# Patient Record
Sex: Female | Born: 1950 | Race: White | Hispanic: No | Marital: Married | Smoking: Never smoker
Health system: Southern US, Community
[De-identification: ages and names within clinical notes are randomized; demographics above are authoritative.]

## PROBLEM LIST (undated history)

## (undated) DIAGNOSIS — I4891 Unspecified atrial fibrillation: Secondary | ICD-10-CM

## (undated) DIAGNOSIS — E669 Obesity, unspecified: Secondary | ICD-10-CM

## (undated) DIAGNOSIS — I340 Nonrheumatic mitral (valve) insufficiency: Secondary | ICD-10-CM

## (undated) DIAGNOSIS — E785 Hyperlipidemia, unspecified: Secondary | ICD-10-CM

## (undated) DIAGNOSIS — C9110 Chronic lymphocytic leukemia of B-cell type not having achieved remission: Secondary | ICD-10-CM

## (undated) DIAGNOSIS — C9590 Leukemia, unspecified not having achieved remission: Secondary | ICD-10-CM

## (undated) DIAGNOSIS — I5189 Other ill-defined heart diseases: Secondary | ICD-10-CM

## (undated) DIAGNOSIS — G2581 Restless legs syndrome: Secondary | ICD-10-CM

## (undated) HISTORY — DX: Obesity, unspecified: E66.9

## (undated) HISTORY — DX: Hyperlipidemia, unspecified: E78.5

## (undated) HISTORY — DX: Restless legs syndrome: G25.81

## (undated) HISTORY — PX: TOTAL ABDOMINAL HYSTERECTOMY: SHX209

## (undated) HISTORY — PX: HEMORROIDECTOMY: SUR656

## (undated) HISTORY — DX: Unspecified atrial fibrillation: I48.91

## (undated) HISTORY — PX: ABDOMINAL HYSTERECTOMY: SHX81

## (undated) HISTORY — DX: Leukemia, unspecified not having achieved remission: C95.90

---

## 2005-01-26 ENCOUNTER — Ambulatory Visit: Payer: Self-pay | Admitting: General Surgery

## 2008-05-27 ENCOUNTER — Ambulatory Visit: Payer: Self-pay | Admitting: Internal Medicine

## 2008-05-27 IMAGING — CT CT HEAD WITHOUT CONTRAST
1 series · 16 of 30 positions shown, 20 images · non-contrast
Comparison: none

REASON FOR EXAM: memory loss
COMMENTS:

[Series 2: soft tissue · axial · 0.40mm/px · z∈[+912,+1047]mm · 16 of 31 slices shown, 20 images]
[im 2/31  brain]
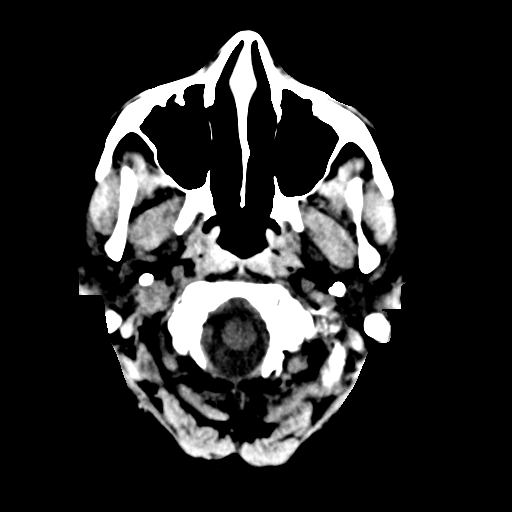
[im 2/31  bone]
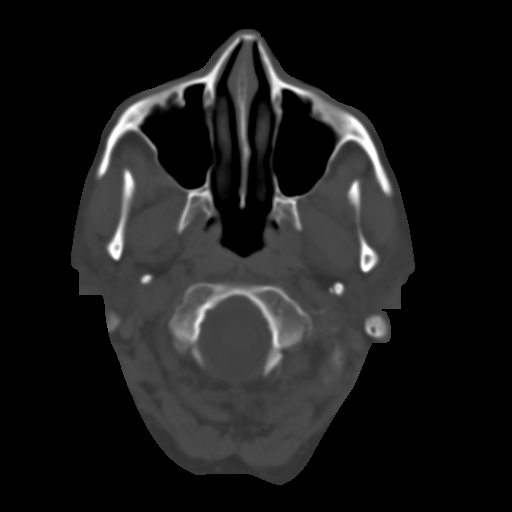
[im 4/31  brain]
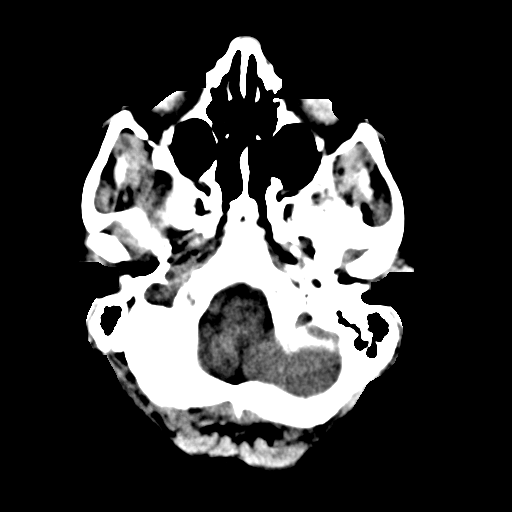
[im 6/31  brain]
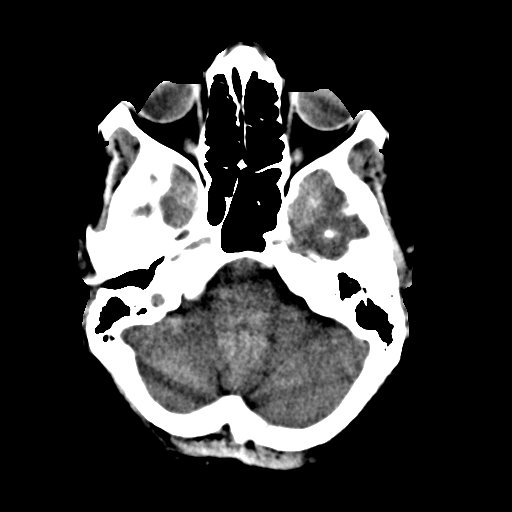
[im 8/31  brain]
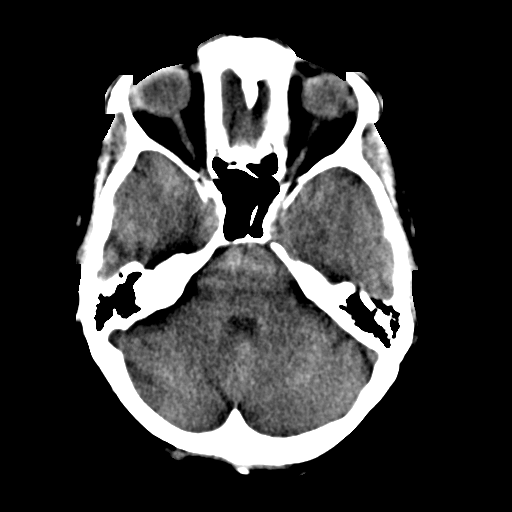
[im 9/31  brain]
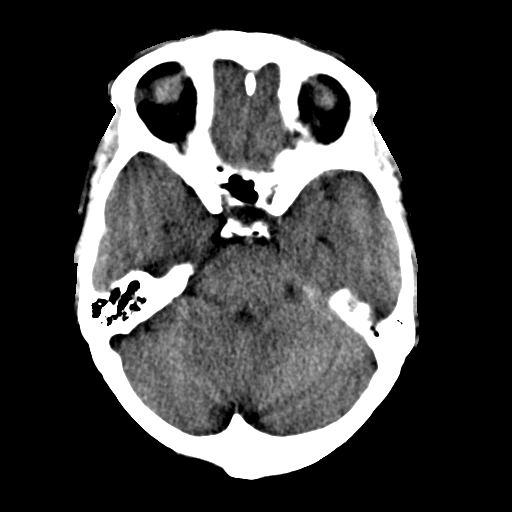
[im 9/31  bone]
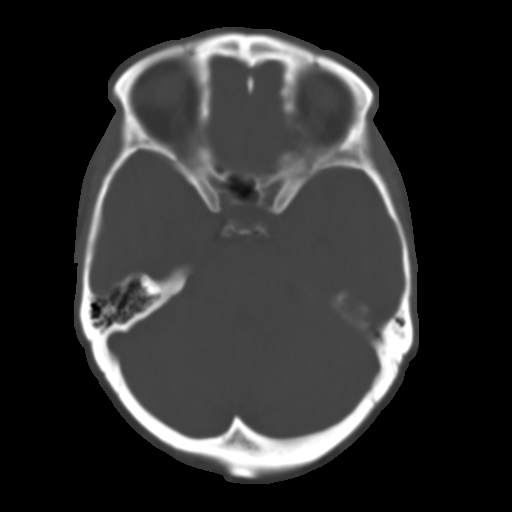
[im 11/31  brain]
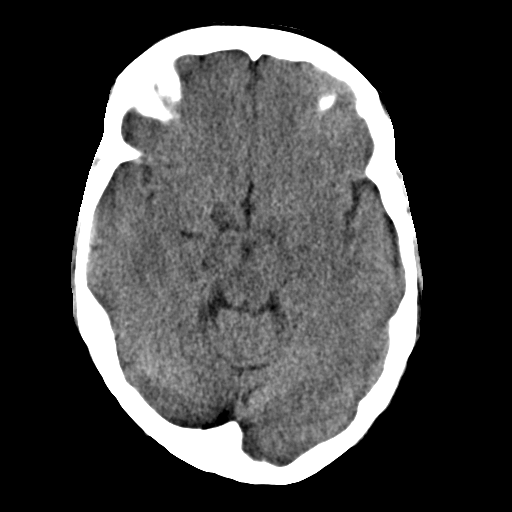
[im 13/31  brain]
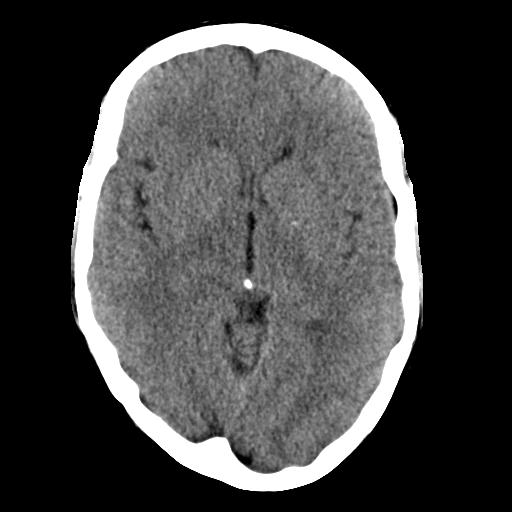
[im 15/31  brain]
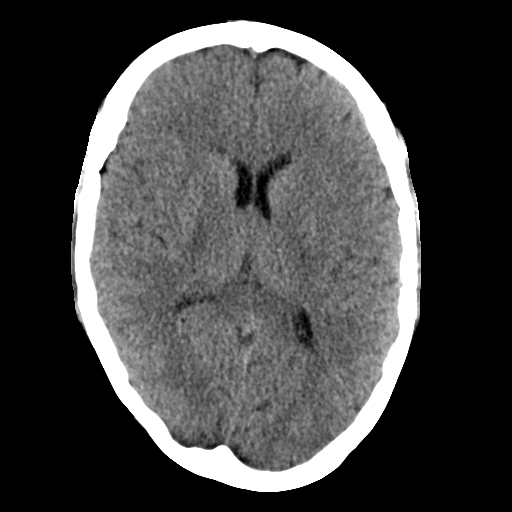
[im 16/31  brain]
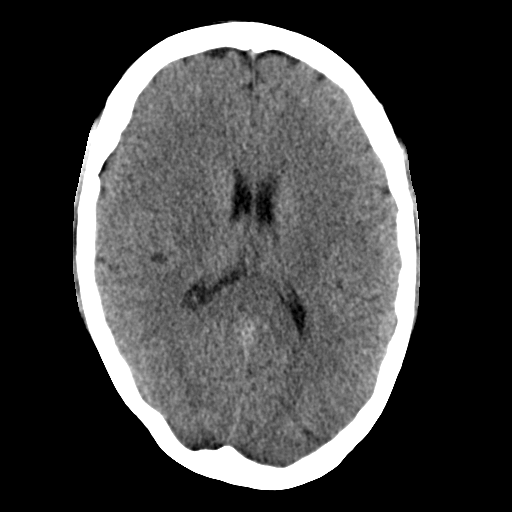
[im 16/31  bone]
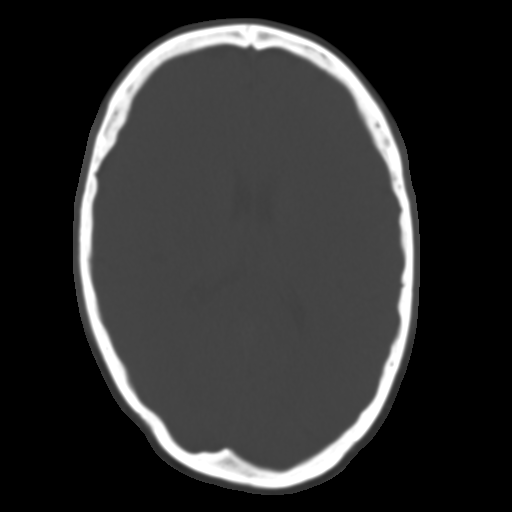
[im 18/31  brain]
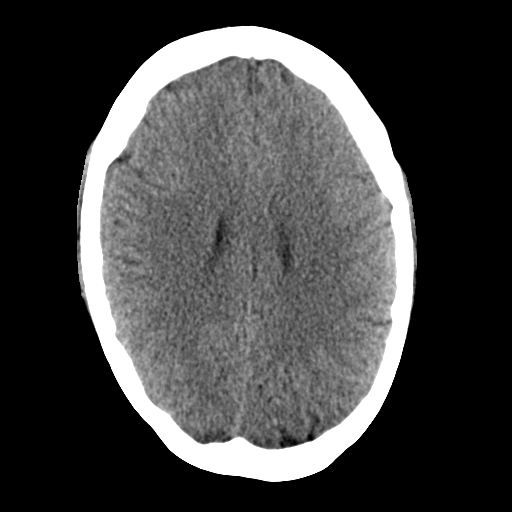
[im 20/31  brain]
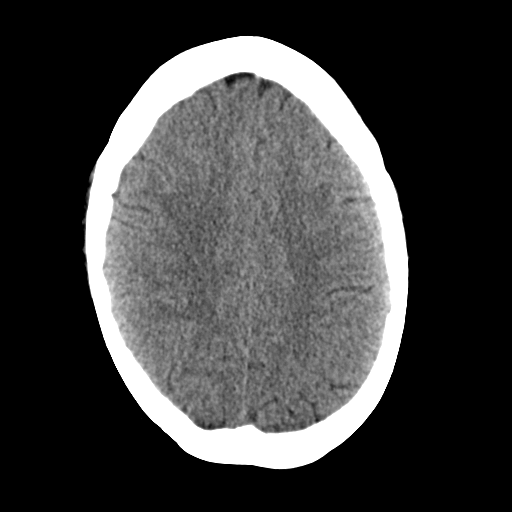
[im 22/31  brain]
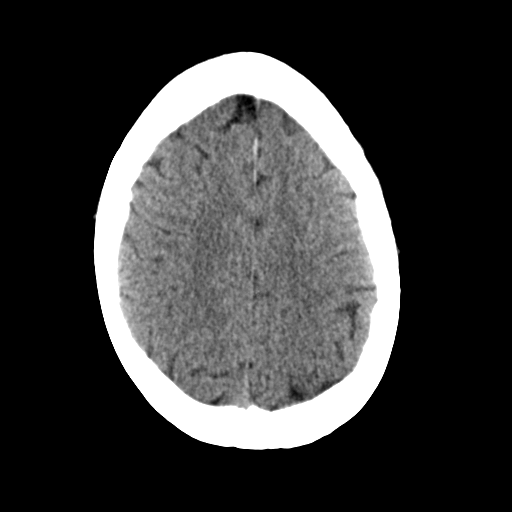
[im 23/31  brain]
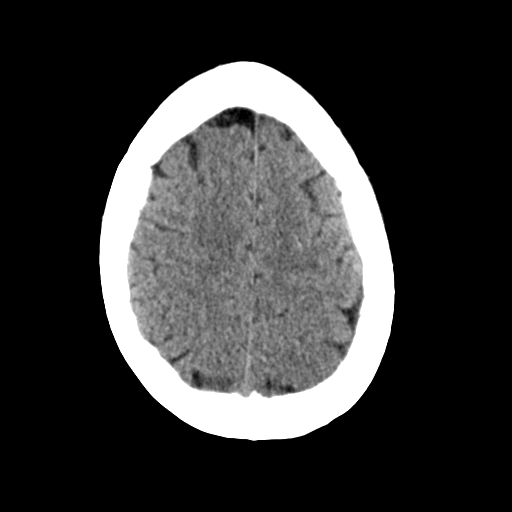
[im 23/31  bone]
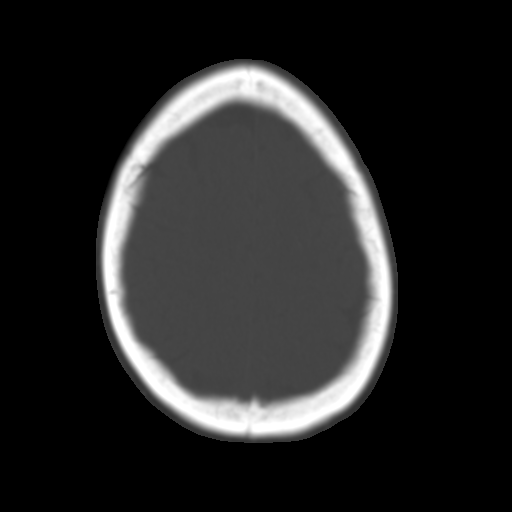
[im 25/31  brain]
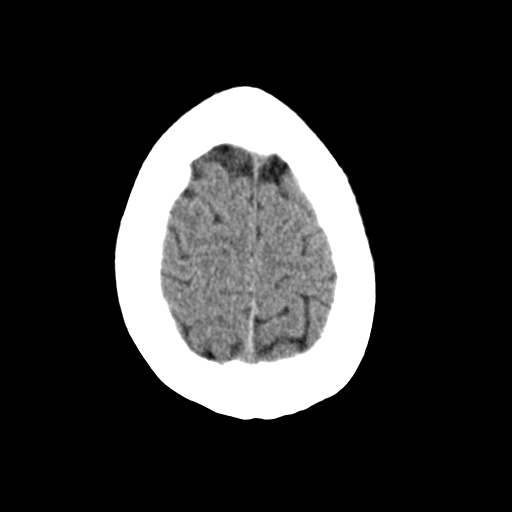
[im 27/31  brain]
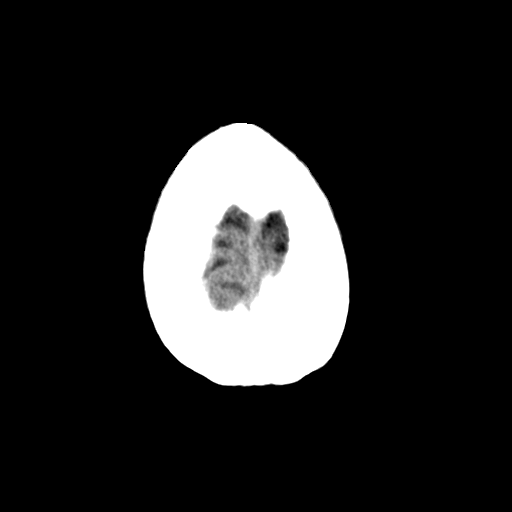
[im 29/31  brain]
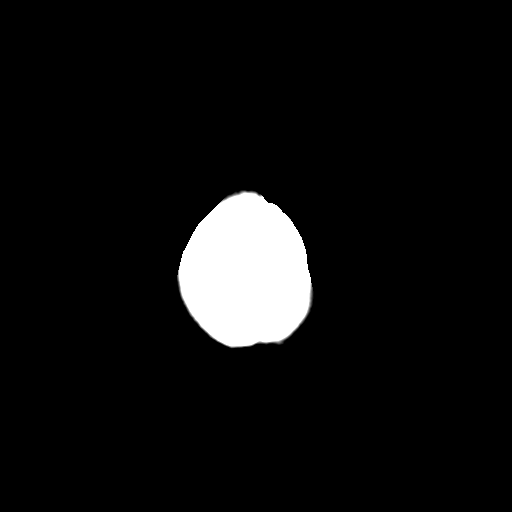

[16 of 30 positions shown; findings below may reference images not displayed]

PROCEDURE:     CT  - CT HEAD WITHOUT CONTRAST  - [DATE]  [DATE]

RESULT:     There is no evidence of intra-axial or extra-axial fluid
collections.  There is no evidence of acute hemorrhage or secondary signs
reflecting mass effect, subacute or chronic infarction.  The visualized bony
skeleton is evaluated and there is no evidence of depressed skull fracture
or further fracture or dislocation.  The visualized mastoid air cells are
clear.  The ventricles, cisterns and sulci are symmetric and patent.
IMPRESSION: 1)No evidence of acute intracranial abnormalities as described above.

2)If there is persistent clinical concern, further evaluation with Brain MRI
is recommended if clinically warranted.

## 2011-02-23 ENCOUNTER — Ambulatory Visit: Payer: Self-pay

## 2011-02-23 IMAGING — MG MAM KOM DGT SCR W/CAD NO ORDER
1 series · 4 of 4 positions shown · non-contrast
Comparison: none

REASON FOR EXAM: scr
COMMENTS:

PROCEDURE:     MAM - MAM DROMNI DGT SCR W/CAD NO ORDER  - [DATE] [DATE]
RESULT:      No dominant masses or pathologic clustered calcifications are
demonstrated.  Exam is stable from prior exams. CAD evaluation is nonfocal.

[Series 3641: R CC · right · 4 of 4 slices shown]
[im 1/4]
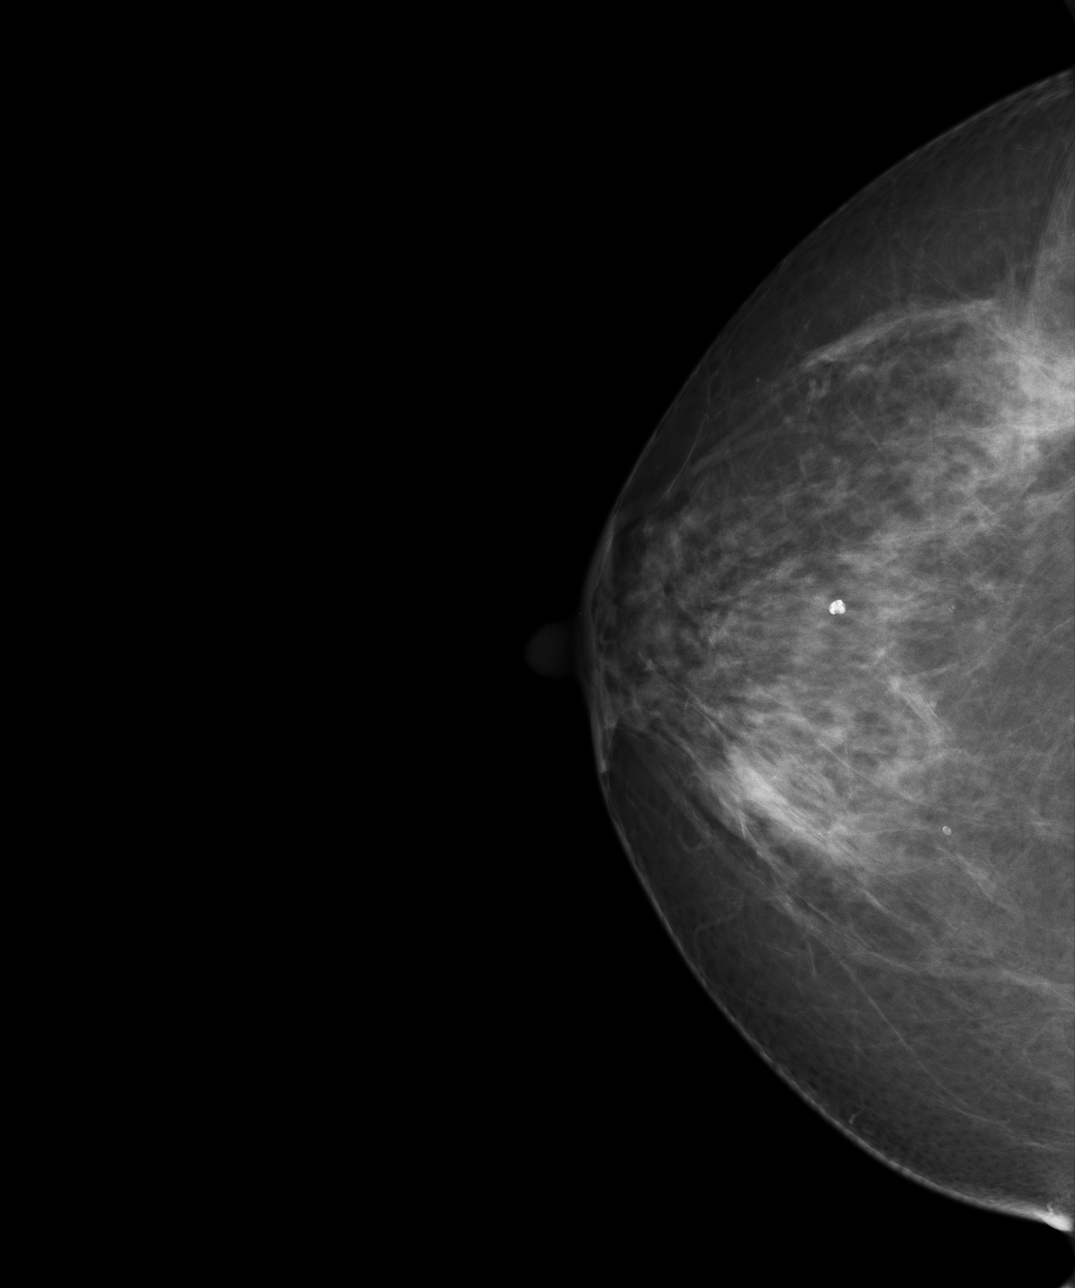
[im 2/4]
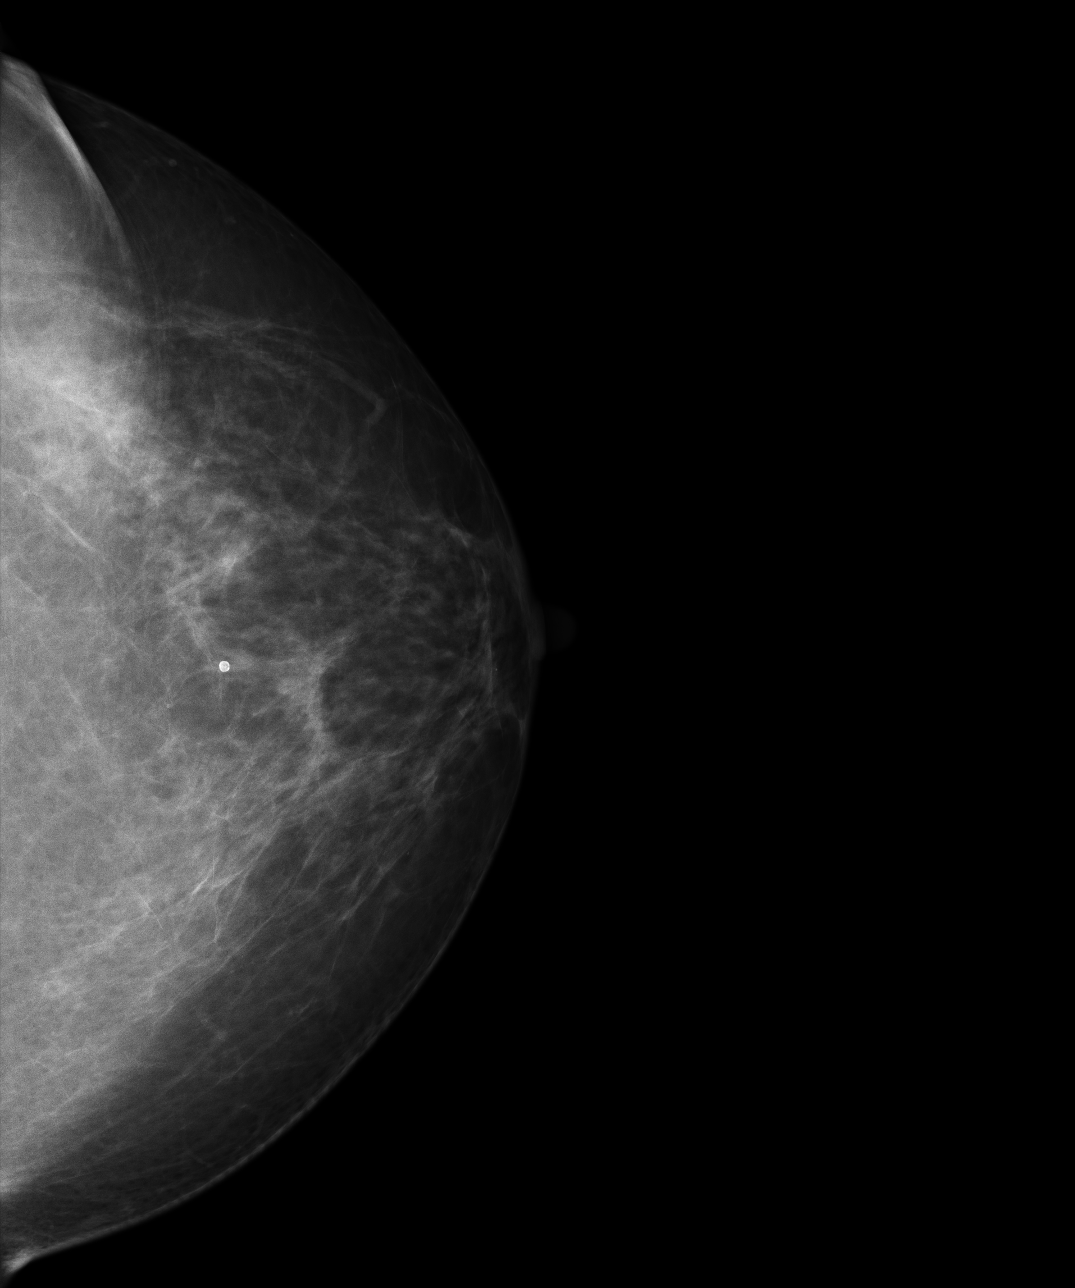
[im 3/4]
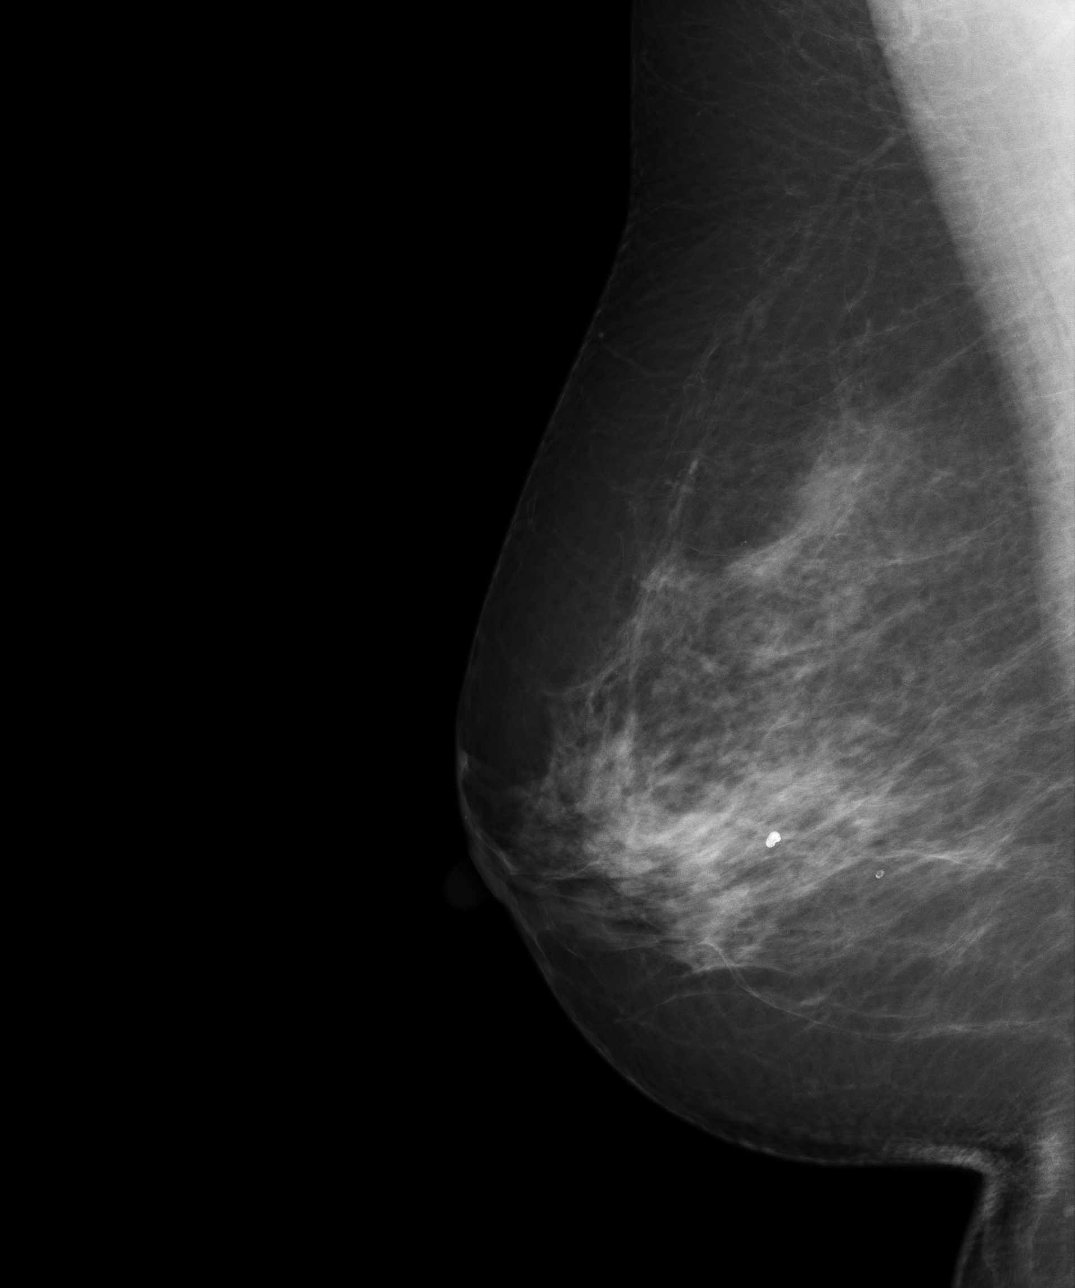
[im 4/4]
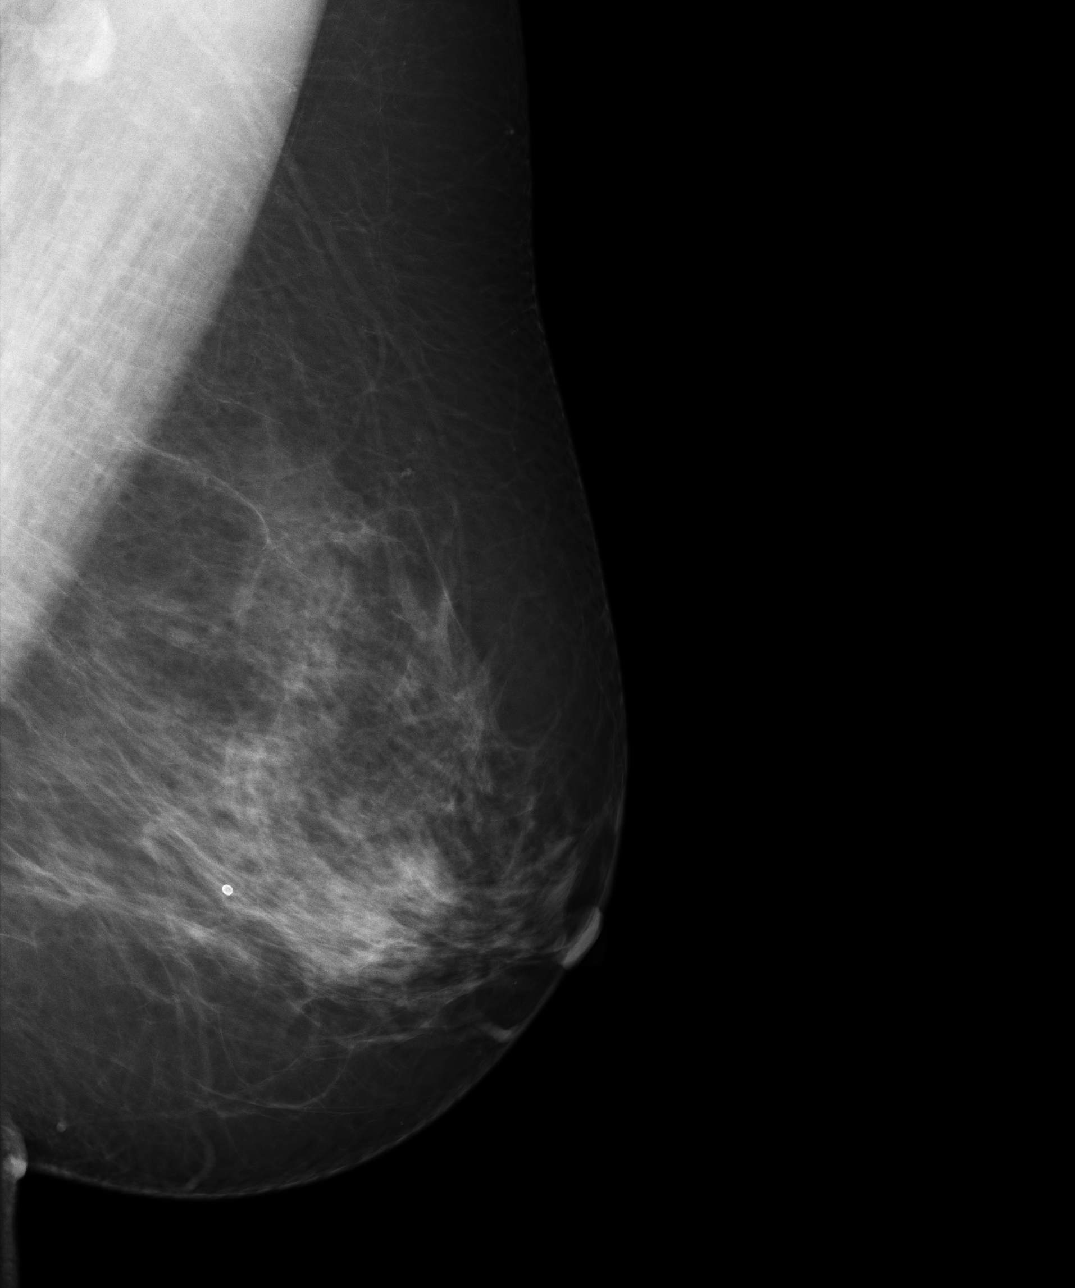

[4 of 4 positions shown; findings below may reference images not displayed]

IMPRESSION: 1.     Benign exam.
2.     Routine yearly follow up exam is suggested.
3.     BI-RADS:  Category 2- Benign Finding.

A negative mammogram report does not preclude biopsy or other evaluation of
a clinically palpable or otherwise suspicious mass or lesion. Breast cancer
may not be detected by mammography in up to 10% of cases.

## 2013-02-19 ENCOUNTER — Ambulatory Visit: Payer: Self-pay

## 2013-02-19 IMAGING — MG MM DIGITAL SCREENING BILAT W/ CAD
1 series · 5 of 5 positions shown · non-contrast
Comparison: Previous exam(s).

REASON FOR EXAM: [REDACTED] SCR MAMMO
COMMENTS:

PROCEDURE:     MAM - MAM [REDACTED] DIG SCREEN MAM W/CAD  - [DATE] [DATE]
CLINICAL DATA: Screening.
DIGITAL SCREENING BILATERAL MAMMOGRAM WITH CAD

[R CC · right · 5 of 5 slices shown]
[im 1/5]
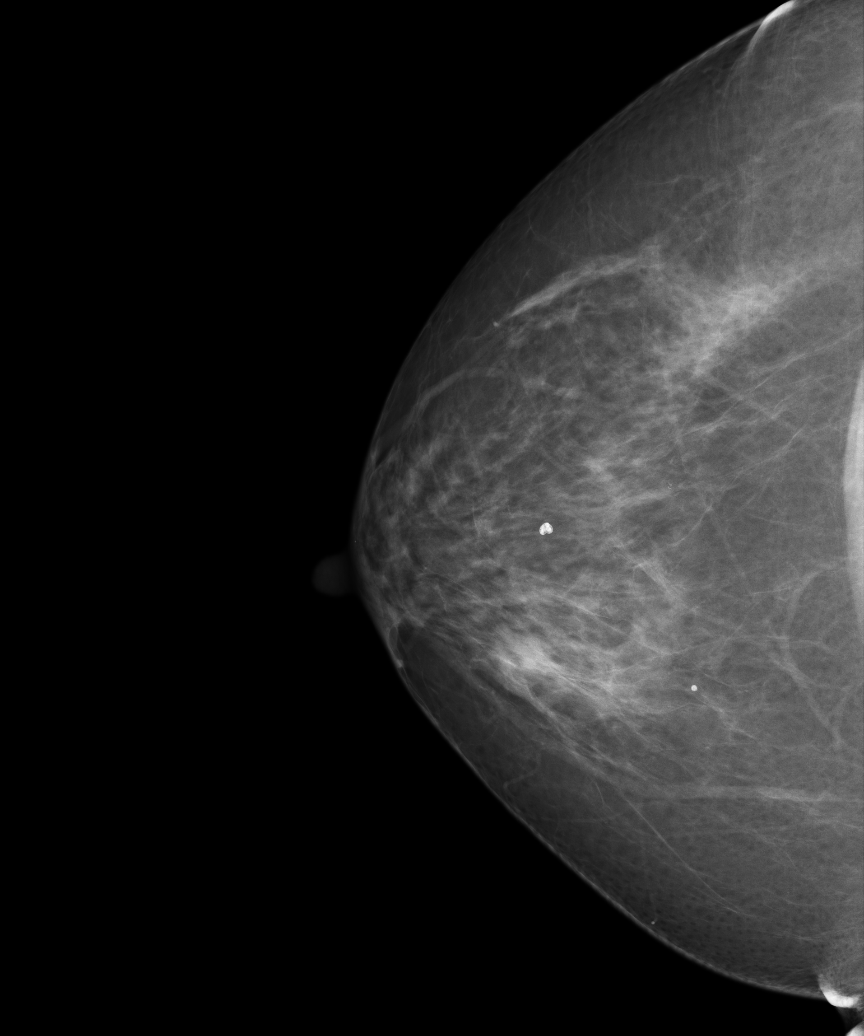
[im 2/5]
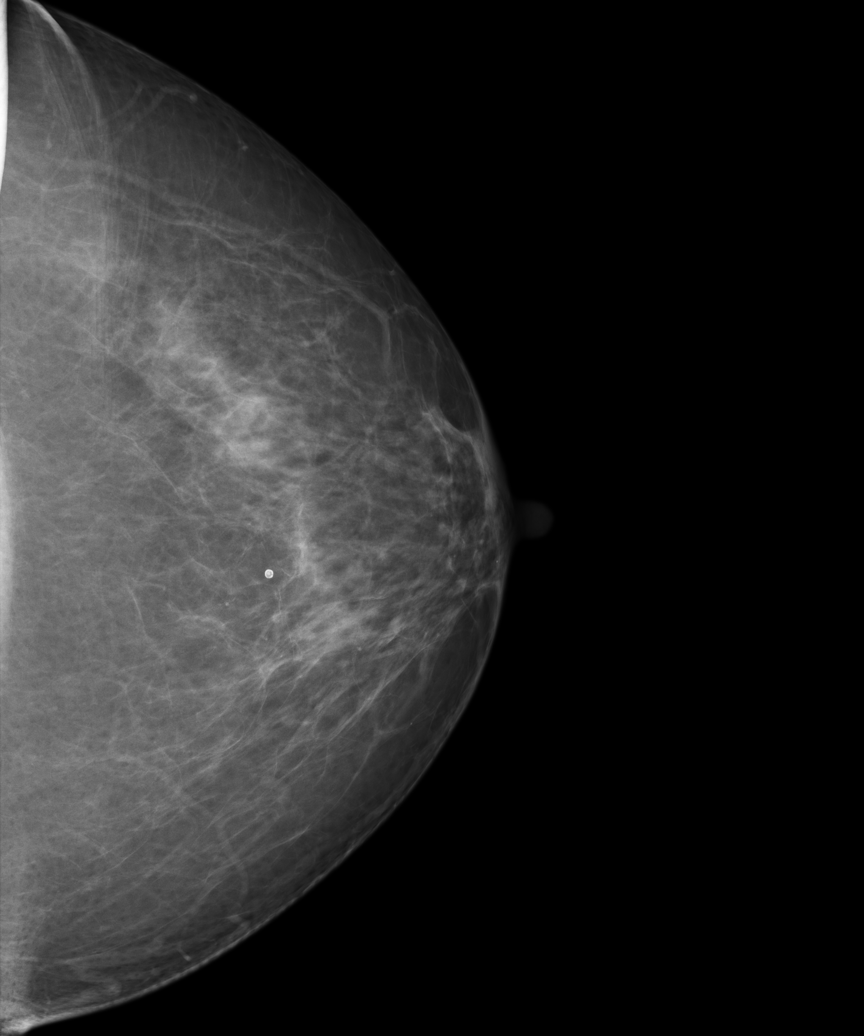
[im 3/5]
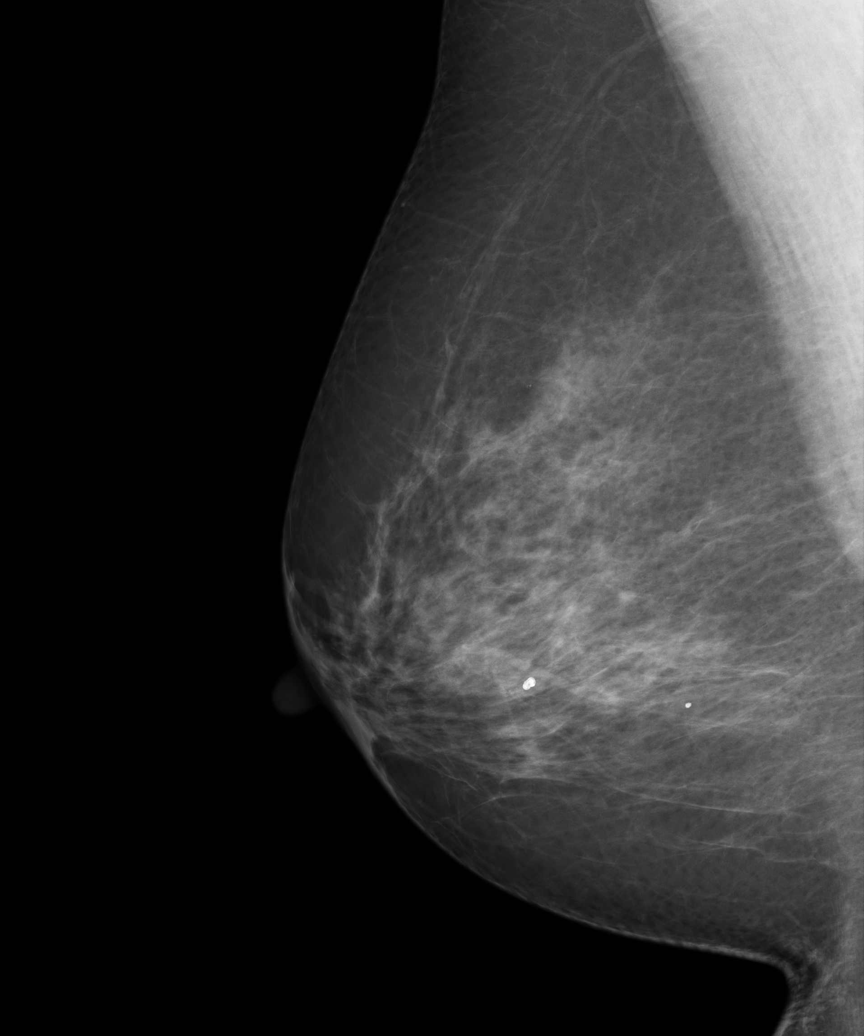
[im 4/5]
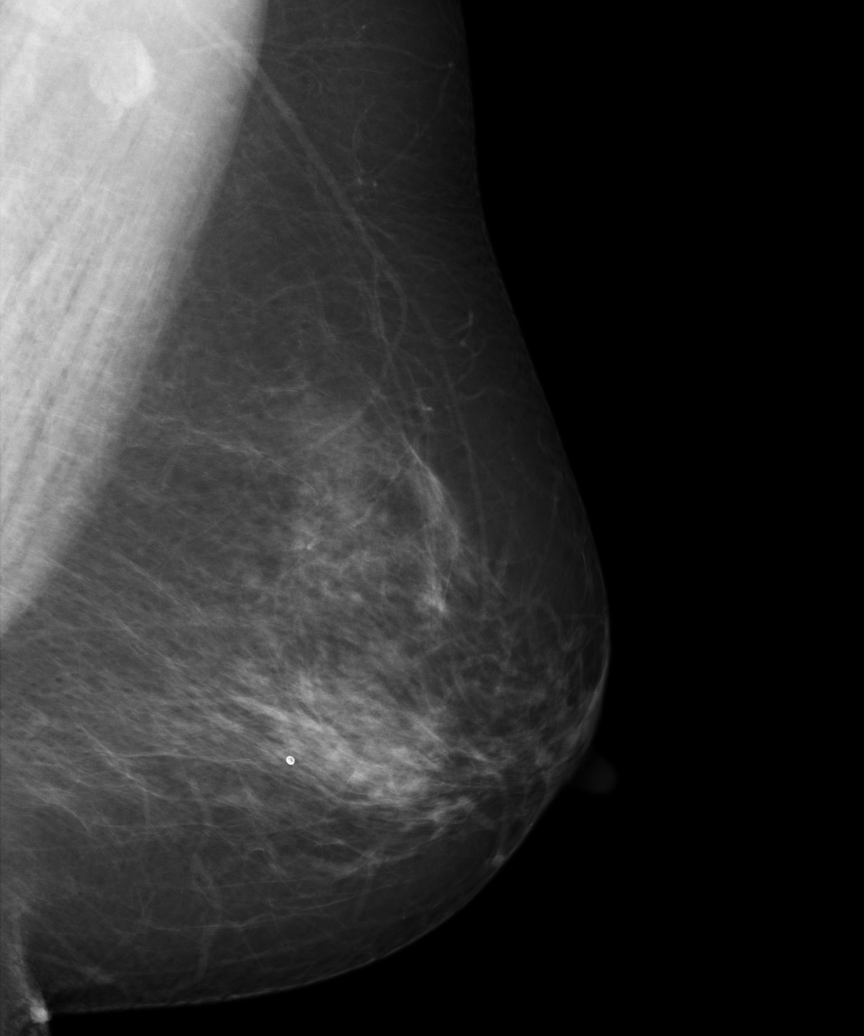
[im 5/5]
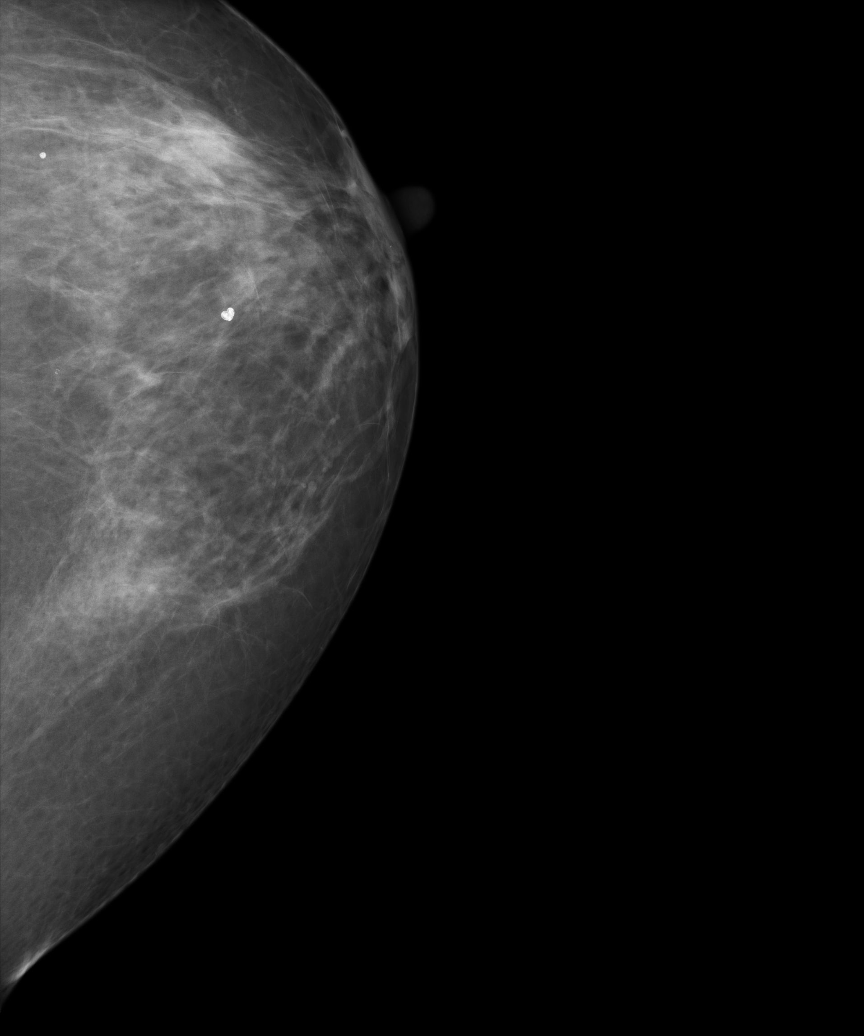

[5 of 5 positions shown; findings below may reference images not displayed]

FINDINGS: ACR Breast Density Category c:  The breast tissue is
heterogeneously dense, which may obscure small masses.

There are no findings suspicious for malignancy.

Images were processed with CAD.
IMPRESSION: No mammographic evidence of malignancy.

A result letter of this screening mammogram will be mailed directly
to the patient.

RECOMMENDATION:
Screening mammogram in one year. (Code:[[place]])

BI-RADS CATEGORY 1:  Negative.

## 2014-02-18 ENCOUNTER — Ambulatory Visit: Payer: Self-pay | Admitting: Physician Assistant

## 2014-02-18 IMAGING — MG MM DIGITAL SCREENING BILAT W/ CAD
1 series · 4 of 4 positions shown · non-contrast
Comparison: Previous exam(s).

CLINICAL DATA: Screening.

EXAM:
DIGITAL SCREENING BILATERAL MAMMOGRAM WITH CAD

[R CC · right · 4 of 4 slices shown]
[im 1/4]
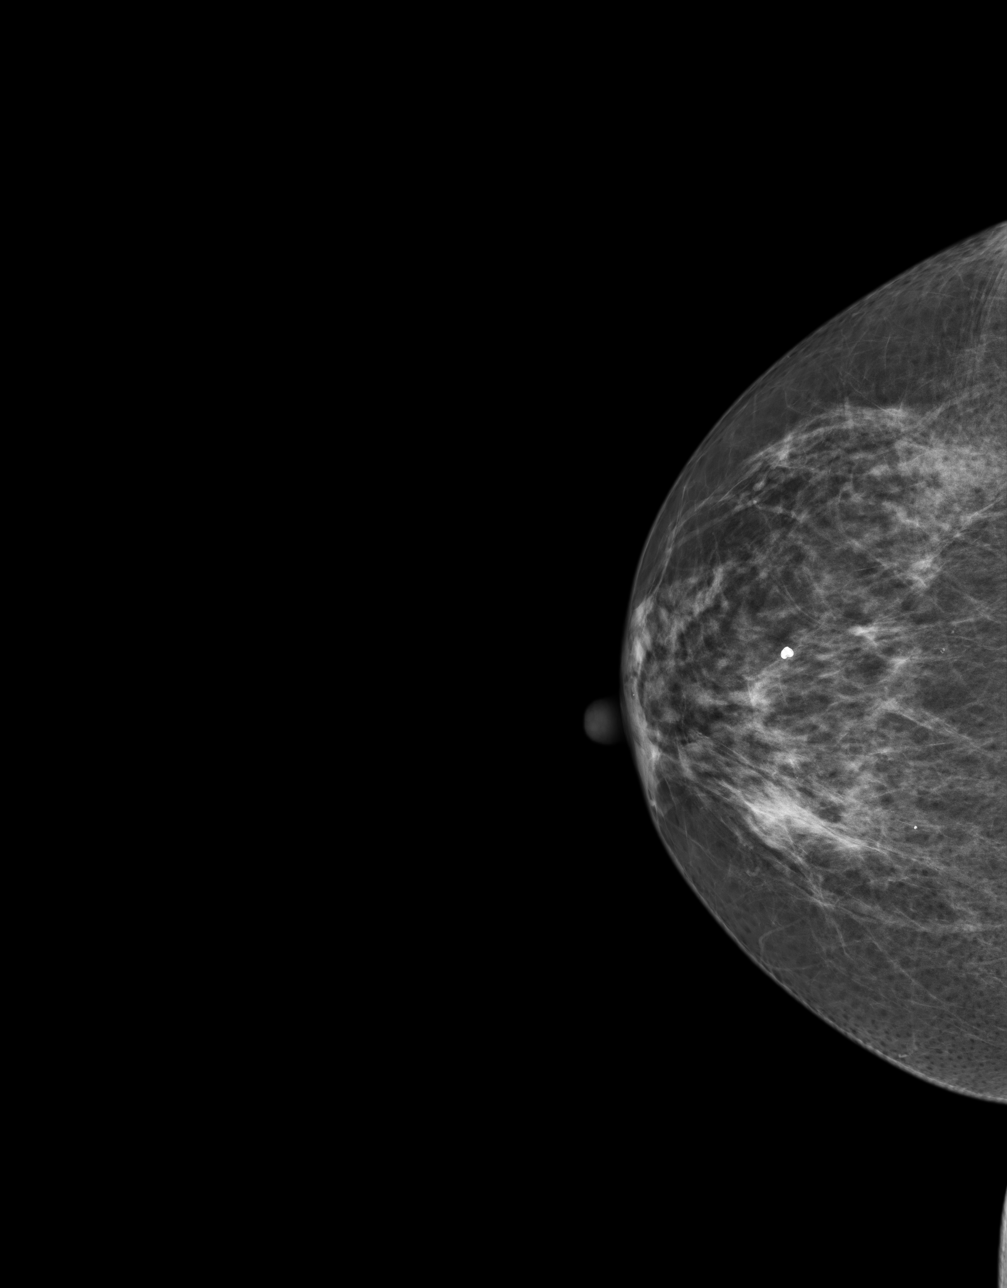
[im 2/4]
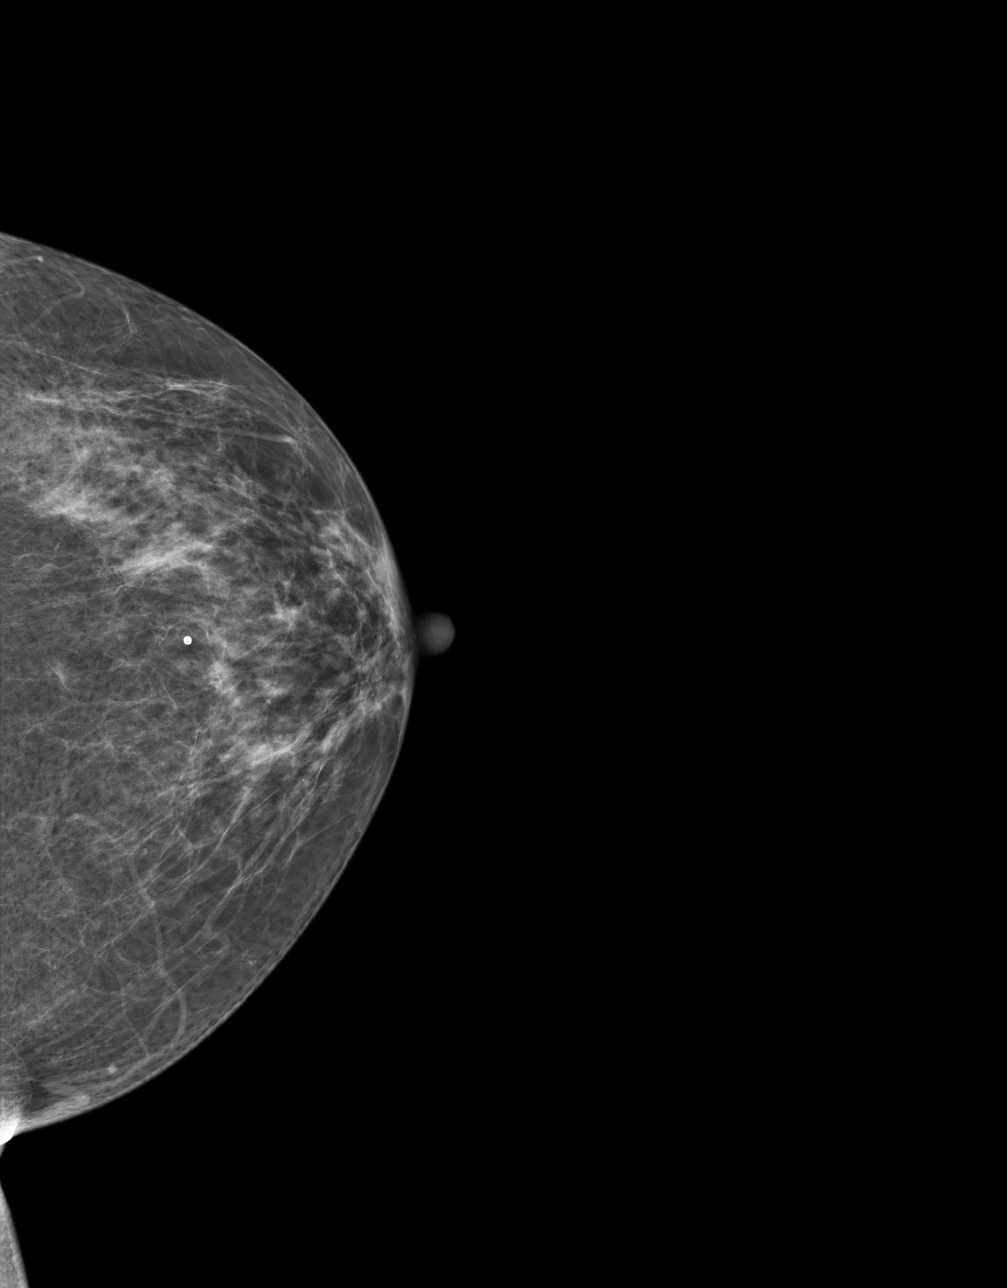
[im 3/4]
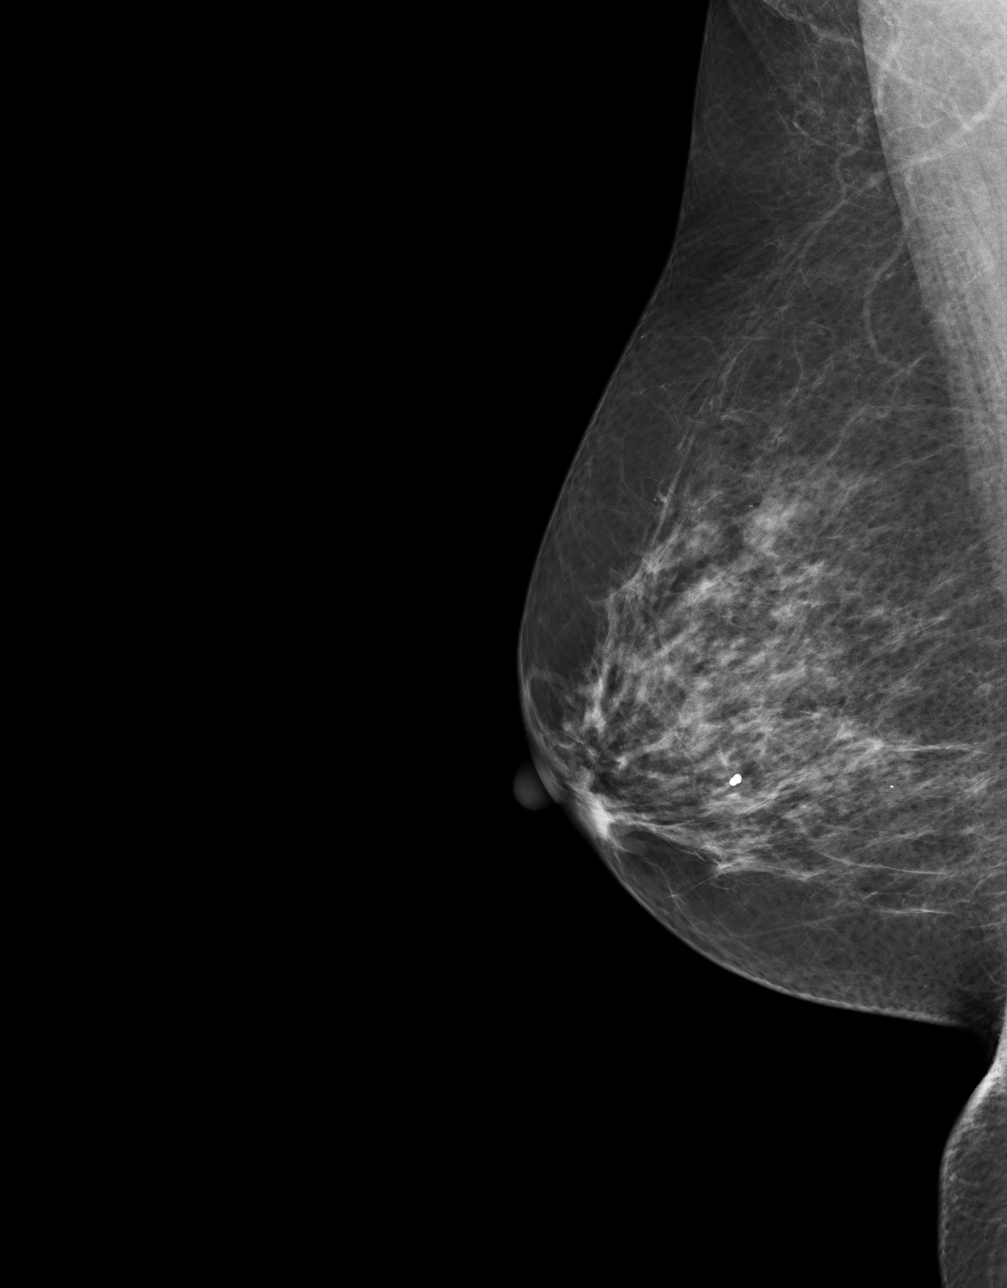
[im 4/4]
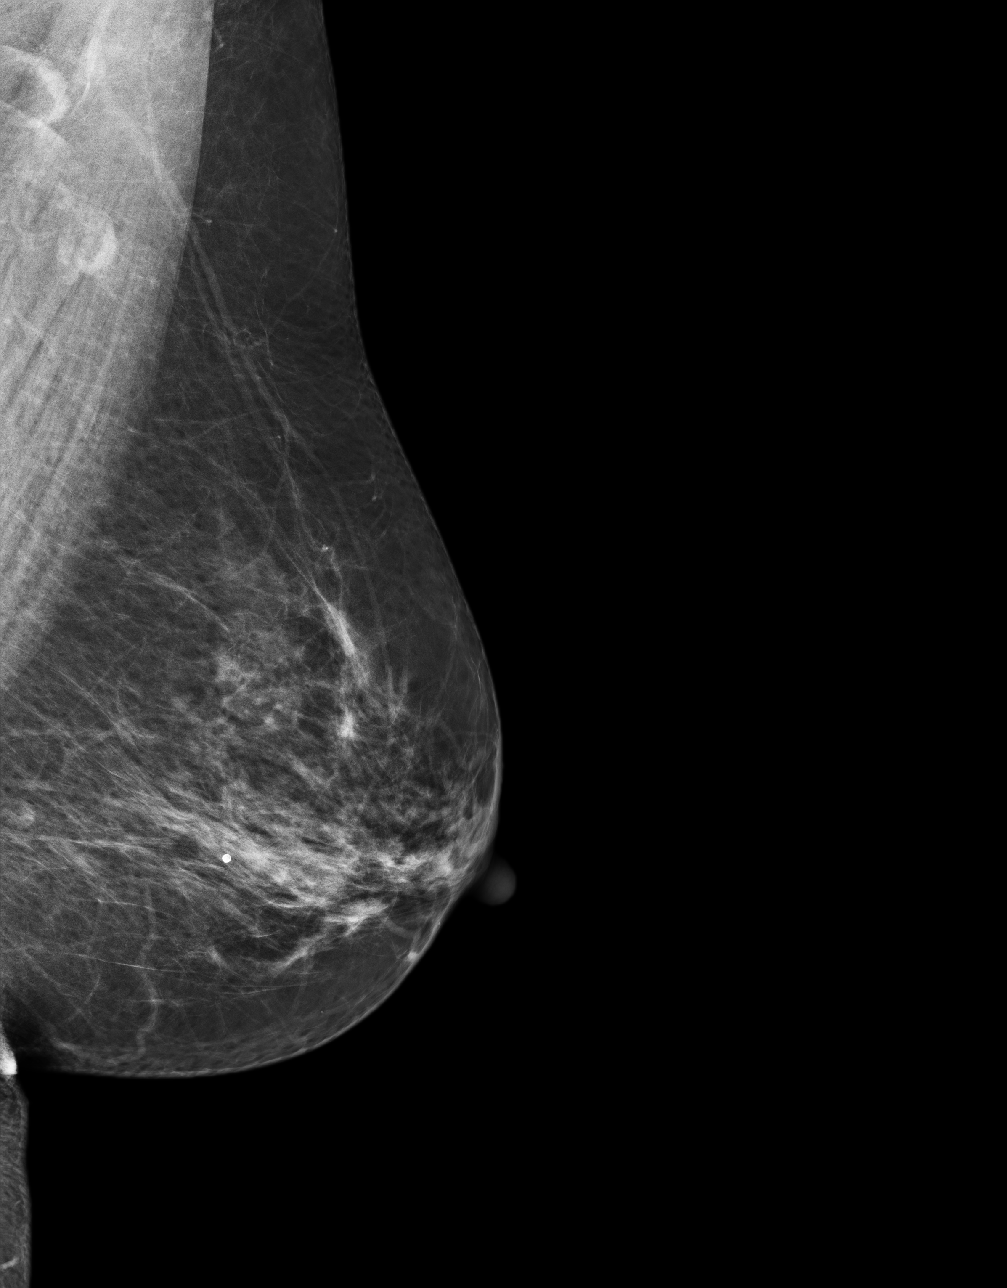

[4 of 4 positions shown; findings below may reference images not displayed]

ACR Breast Density Category c: The breast tissue is heterogeneously
dense, which may obscure small masses.
FINDINGS: There are no findings suspicious for malignancy. Images were
processed with CAD.
IMPRESSION: No mammographic evidence of malignancy. A result letter of this
screening mammogram will be mailed directly to the patient.

RECOMMENDATION:
Screening mammogram in one year. (Code:[0J])

BI-RADS CATEGORY  1: Negative.

## 2014-12-26 ENCOUNTER — Encounter: Payer: Self-pay | Admitting: Family Medicine

## 2014-12-26 ENCOUNTER — Ambulatory Visit (INDEPENDENT_AMBULATORY_CARE_PROVIDER_SITE_OTHER): Payer: PRIVATE HEALTH INSURANCE | Admitting: Family Medicine

## 2014-12-26 VITALS — BP 117/74 | HR 80 | Temp 97.2°F | Ht 65.5 in | Wt 221.0 lb

## 2014-12-26 DIAGNOSIS — Z1239 Encounter for other screening for malignant neoplasm of breast: Secondary | ICD-10-CM

## 2014-12-26 DIAGNOSIS — G2581 Restless legs syndrome: Secondary | ICD-10-CM | POA: Diagnosis not present

## 2014-12-26 DIAGNOSIS — Z0000 Encounter for general adult medical examination without abnormal findings: Secondary | ICD-10-CM

## 2014-12-26 DIAGNOSIS — Z79890 Hormone replacement therapy (postmenopausal): Secondary | ICD-10-CM | POA: Diagnosis not present

## 2014-12-26 DIAGNOSIS — E669 Obesity, unspecified: Secondary | ICD-10-CM

## 2014-12-26 MED ORDER — ROPINIROLE HCL 1 MG PO TABS: 1.0000 mg | ORAL_TABLET | Freq: Three times a day (TID) | ORAL | Status: DC

## 2014-12-26 NOTE — Patient Instructions (Addendum)
Try decreasing your estrogen to every other day Please do get a mammogram and once I see that it is normal, we'll consider continuing your estrogen but at a lower level; it is ideal to taper you off of this as tolerated  Check out the information at familydoctor.org entitled "What It Takes to Lose Weight" Try to lose between 1-2 pounds per week by taking in fewer calories and burning off more calories You can succeed by limiting portions, limiting foods dense in calories and fat, becoming more active, and drinking 8 glasses of water a day Don't skip meals, especially breakfast, as skipping meals may alter your metabolism Do not use over-the-counter weight loss pills or gimmicks that claim rapid weight loss A healthy BMI (or body mass index) is between 18.5 and 24.9 You can calculate your ideal BMI at the [employer] website [employer]fr  We'll check labs today and contact you about those results  Health Maintenance Adopting a healthy lifestyle and getting preventive care can go a long way to promote health and wellness. Talk with your health care provider about what schedule of regular examinations is right for you. This is a good chance for you to check in with your provider about disease prevention and staying healthy. In between checkups, there are plenty of things you can do on your own. Experts have done a lot of research about which lifestyle changes and preventive measures are most likely to keep you healthy. Ask your health care provider for more information. WEIGHT AND DIET  Eat a healthy diet  Be sure to include plenty of vegetables, fruits, low-fat dairy products, and lean protein.  Do not eat a lot of foods high in solid fats, added sugars, or salt.  Get regular exercise. This is one of the most important things you can do for your health.  Most adults should exercise for at least 150 minutes each week. The exercise should increase  your heart rate and make you sweat (moderate-intensity exercise).  Most adults should also do strengthening exercises at least twice a week. This is in addition to the moderate-intensity exercise.  Maintain a healthy weight  Body mass index (BMI) is a measurement that can be used to identify possible weight problems. It estimates body fat based on height and weight. Your health care provider can help determine your BMI and help you achieve or maintain a healthy weight.  For females 55 years of age and older:   A BMI below 18.5 is considered underweight.  A BMI of 18.5 to 24.9 is normal.  A BMI of 25 to 29.9 is considered overweight.  A BMI of 30 and above is considered obese.  Watch levels of cholesterol and blood lipids  You should start having your blood tested for lipids and cholesterol at 63 years of age, then have this test every 5 years.  You may need to have your cholesterol levels checked more often if:  Your lipid or cholesterol levels are high.  You are older than 63 years of age.  You are at high risk for heart disease.  CANCER SCREENING   Lung Cancer  Lung cancer screening is recommended for adults 65-85 years old who are at high risk for lung cancer because of a history of smoking.  A yearly low-dose CT scan of the lungs is recommended for people who:  Currently smoke.  Have quit within the past 15 years.  Have at least a 30-pack-year history of smoking. A pack year is smoking an  average of one pack of cigarettes a day for 1 year.  Yearly screening should continue until it has been 15 years since you quit.  Yearly screening should stop if you develop a health problem that would prevent you from having lung cancer treatment.  Breast Cancer  Practice breast self-awareness. This means understanding how your breasts normally appear and feel.  It also means doing regular breast self-exams. Let your health care provider know about any changes, no matter  how small.  If you are in your 20s or 30s, you should have a clinical breast exam (CBE) by a health care provider every 1-3 years as part of a regular health exam.  If you are 10 or older, have a CBE every year. Also consider having a breast X-ray (mammogram) every year.  If you have a family history of breast cancer, talk to your health care provider about genetic screening.  If you are at high risk for breast cancer, talk to your health care provider about having an MRI and a mammogram every year.  Breast cancer gene (BRCA) assessment is recommended for women who have family members with BRCA-related cancers. BRCA-related cancers include:  Breast.  Ovarian.  Tubal.  Peritoneal cancers.  Results of the assessment will determine the need for genetic counseling and BRCA1 and BRCA2 testing. Cervical Cancer Routine pelvic examinations to screen for cervical cancer are no longer recommended for nonpregnant women who are considered low risk for cancer of the pelvic organs (ovaries, uterus, and vagina) and who do not have symptoms. A pelvic examination may be necessary if you have symptoms including those associated with pelvic infections. Ask your health care provider if a screening pelvic exam is right for you.   The Pap test is the screening test for cervical cancer for women who are considered at risk.  If you had a hysterectomy for a problem that was not cancer or a condition that could lead to cancer, then you no longer need Pap tests.  If you are older than 65 years, and you have had normal Pap tests for the past 10 years, you no longer need to have Pap tests.  If you have had past treatment for cervical cancer or a condition that could lead to cancer, you need Pap tests and screening for cancer for at least 20 years after your treatment.  If you no longer get a Pap test, assess your risk factors if they change (such as having a new sexual partner). This can affect whether you should  start being screened again.  Some women have medical problems that increase their chance of getting cervical cancer. If this is the case for you, your health care provider may recommend more frequent screening and Pap tests.  The human papillomavirus (HPV) test is another test that may be used for cervical cancer screening. The HPV test looks for the virus that can cause cell changes in the cervix. The cells collected during the Pap test can be tested for HPV.  The HPV test can be used to screen women 83 years of age and older. Getting tested for HPV can extend the interval between normal Pap tests from three to five years.  An HPV test also should be used to screen women of any age who have unclear Pap test results.  After 63 years of age, women should have HPV testing as often as Pap tests.  Colorectal Cancer  This type of cancer can be detected and often prevented.  Routine  colorectal cancer screening usually begins at 63 years of age and continues through 63 years of age.  Your health care provider may recommend screening at an earlier age if you have risk factors for colon cancer.  Your health care provider may also recommend using home test kits to check for hidden blood in the stool.  A small camera at the end of a tube can be used to examine your colon directly (sigmoidoscopy or colonoscopy). This is done to check for the earliest forms of colorectal cancer.  Routine screening usually begins at age 45.  Direct examination of the colon should be repeated every 5-10 years through 63 years of age. However, you may need to be screened more often if early forms of precancerous polyps or small growths are found. Skin Cancer  Check your skin from head to toe regularly.  Tell your health care provider about any new moles or changes in moles, especially if there is a change in a mole's shape or color.  Also tell your health care provider if you have a mole that is larger than the size  of a pencil eraser.  Always use sunscreen. Apply sunscreen liberally and repeatedly throughout the day.  Protect yourself by wearing long sleeves, pants, a wide-brimmed hat, and sunglasses whenever you are outside. HEART DISEASE, DIABETES, AND HIGH BLOOD PRESSURE   Have your blood pressure checked at least every 1-2 years. High blood pressure causes heart disease and increases the risk of stroke.  If you are between 62 years and 1 years old, ask your health care provider if you should take aspirin to prevent strokes.  Have regular diabetes screenings. This involves taking a blood sample to check your fasting blood sugar level.  If you are at a normal weight and have a low risk for diabetes, have this test once every three years after 63 years of age.  If you are overweight and have a high risk for diabetes, consider being tested at a younger age or more often. PREVENTING INFECTION  Hepatitis B  If you have a higher risk for hepatitis B, you should be screened for this virus. You are considered at high risk for hepatitis B if:  You were born in a country where hepatitis B is common. Ask your health care provider which countries are considered high risk.  Your parents were born in a high-risk country, and you have not been immunized against hepatitis B (hepatitis B vaccine).  You have HIV or AIDS.  You use needles to inject street drugs.  You live with someone who has hepatitis B.  You have had sex with someone who has hepatitis B.  You get hemodialysis treatment.  You take certain medicines for conditions, including cancer, organ transplantation, and autoimmune conditions. Hepatitis C  Blood testing is recommended for:  Everyone born from [birth year] through 1964.  Anyone with known risk factors for hepatitis C. Sexually transmitted infections (STIs)  You should be screened for sexually transmitted infections (STIs) including gonorrhea and chlamydia if:  You are sexually  active and are younger than 63 years of age.  You are older than 63 years of age and your health care provider tells you that you are at risk for this type of infection.  Your sexual activity has changed since you were last screened and you are at an increased risk for chlamydia or gonorrhea. Ask your health care provider if you are at risk.  If you do not have HIV, but are at  risk, it may be recommended that you take a prescription medicine daily to prevent HIV infection. This is called pre-exposure prophylaxis (PrEP). You are considered at risk if:  You are sexually active and do not regularly use condoms or know the HIV status of your partner(s).  You take drugs by injection.  You are sexually active with a partner who has HIV. Talk with your health care provider about whether you are at high risk of being infected with HIV. If you choose to begin PrEP, you should first be tested for HIV. You should then be tested every 3 months for as long as you are taking PrEP.  PREGNANCY   If you are premenopausal and you may become pregnant, ask your health care provider about preconception counseling.  If you may become pregnant, take 400 to 800 micrograms (mcg) of folic acid every day.  If you want to prevent pregnancy, talk to your health care provider about birth control (contraception). OSTEOPOROSIS AND MENOPAUSE   Osteoporosis is a disease in which the bones lose minerals and strength with aging. This can result in serious bone fractures. Your risk for osteoporosis can be identified using a bone density scan.  If you are 34 years of age or older, or if you are at risk for osteoporosis and fractures, ask your health care provider if you should be screened.  Ask your health care provider whether you should take a calcium or vitamin D supplement to lower your risk for osteoporosis.  Menopause may have certain physical symptoms and risks.  Hormone replacement therapy may reduce some of these  symptoms and risks. Talk to your health care provider about whether hormone replacement therapy is right for you.  HOME CARE INSTRUCTIONS   Schedule regular health, dental, and eye exams.  Stay current with your immunizations.   Do not use any tobacco products including cigarettes, chewing tobacco, or electronic cigarettes.  If you are pregnant, do not drink alcohol.  If you are breastfeeding, limit how much and how often you drink alcohol.  Limit alcohol intake to no more than 1 drink per day for nonpregnant women. One drink equals 12 ounces of beer, 5 ounces of wine, or 1 ounces of hard liquor.  Do not use street drugs.  Do not share needles.  Ask your health care provider for help if you need support or information about quitting drugs.  Tell your health care provider if you often feel depressed.  Tell your health care provider if you have ever been abused or do not feel safe at home. Document Released: 12/07/2010 Document Revised: 10/08/2013 Document Reviewed: 04/25/2013 [facility] Patient Information 2015 [place], [place]. This information is not intended to replace advice given to you by your health care provider. Make sure you discuss any questions you have with your health care provider.

## 2014-12-26 NOTE — Progress Notes (Signed)
BP 117/74 mmHg  Pulse 80  Temp(Src) 97.2 F (36.2 C)  Ht 5' 5.5" (1.664 m)  Wt 221 lb (100.245 kg)  BMI 36.20 kg/m2  SpO2 97%   Subjective:    Patient ID: Erika Anderson, female    DOB: 12-20-1950, 64 y.o.   MRN: [id number]  HPI: Erika Anderson is a 64 y.o. female  Chief Complaint  Patient presents with  . Establish Care  . Obesity    would like to get started back on Phenteramine.  She is here to establish care, have a physical, needs meds She would like get caught up on her regular routine She needs a doctor  She is on estradiol, for several years; started at menopause, hysterectomy several years ago, ovaries intact; having trouble with bleeding, no cancer; does not get hot flashes and has not had flashes when missing doses; last mammogram was a few years ago  She takes Requip, takes 3 a day; really helps restless legs  This is her peak weight; she got up when pregnant, gained 50-60 pounds; she could lose it but then get big again; she has so much going on; kids are getting married and helping with her grandchildren; helping her dad; his health is good but in first stages of dementia; asked about phentermine  Last labs were one year ago Past Medical History  Diagnosis Date  . RLS (restless legs syndrome)   . Obesity (BMI 30-39.9)   . Restless legs syndrome (RLS) 12/28/2014    Family History  Problem Relation Age of Onset  . Cancer Mother     ovarian  Sister has restless legs and mother had restless legs too; sister takes same med and takes four times a day  Relevant past medical, surgical, family and social history reviewed and updated as indicated. Interim medical history since our last visit reviewed. Allergies and medications reviewed and updated.  Review of Systems  Constitutional: Negative for fever.  HENT: Negative for ear pain and nosebleeds.   Eyes: Negative for pain.  Respiratory: Negative for cough and shortness of breath.   Cardiovascular:  Negative for chest pain, palpitations and leg swelling.  Gastrointestinal: Negative for blood in stool.  Endocrine: Positive for polyuria (more and more). Negative for cold intolerance, heat intolerance and polydipsia.  Genitourinary: Negative for dysuria, hematuria and vaginal discharge.  Musculoskeletal: Negative for back pain and arthralgias.  Skin: Negative for pallor and rash.  Allergic/Immunologic: Negative for environmental allergies and food allergies.  Neurological: Negative for tremors.  Hematological: Bruises/bleeds easily (bruises easily; long time).  Psychiatric/Behavioral: Negative for dysphoric mood (stress caring for father in early dementia).   Per HPI unless specifically indicated above     Objective:    BP 117/74 mmHg  Pulse 80  Temp(Src) 97.2 F (36.2 C)  Ht 5' 5.5" (1.664 m)  Wt 221 lb (100.245 kg)  BMI 36.20 kg/m2  SpO2 97%  Wt Readings from Last 3 Encounters:  12/26/14 221 lb (100.245 kg)    Physical Exam  Constitutional: She appears well-developed and well-nourished. No distress.  HENT:  Head: Normocephalic and atraumatic.  Eyes: EOM are normal. No scleral icterus.  Neck: No thyromegaly present.  Cardiovascular: Normal rate, regular rhythm and normal heart sounds.   No murmur heard. Pulmonary/Chest: Effort normal and breath sounds normal. No respiratory distress. She has no wheezes. Right breast exhibits no inverted nipple, no mass, no nipple discharge, no skin change and no tenderness. Left breast exhibits no inverted nipple, no  mass, no nipple discharge, no skin change and no tenderness. Breasts are symmetrical.  Abdominal: Soft. Bowel sounds are normal. She exhibits no distension.  Musculoskeletal: Normal range of motion. She exhibits no edema.  Neurological: She is alert. She exhibits normal muscle tone.  Skin: Skin is warm and dry. She is not diaphoretic. No pallor.  Psychiatric: She has a normal mood and affect. Her behavior is normal. Judgment  and thought content normal.      Assessment & Plan:   Problem List Items Addressed This Visit      Other   Postmenopausal HRT (hormone replacement therapy)    Discussed risk of HRT; I will not refill until she gets a mammogram; in the meantime, she can decrease her use, skip every other day; would advocate after her mammogram that we wean even further; she denies hot flashes and there is really no need for continuation of therapy at this time, risks of medicine outweigh benefits      Obesity (BMI 30-39.9)    I explained I am very against the use of phentermine; I encouraged healthy weight loss through guidelines on familydoctor.org site; recommended slow modest safe weight loss through lifestyle changes      Restless legs syndrome (RLS)    Check ferritin level; she is on a high dose of medicine for her condition, I explained, but she points out her sister takes even more than she does; no changes made in regimen      Relevant Orders   Ferritin (Completed)    Other Visit Diagnoses    Preventative health care    -  Primary    preventive health care encouraged; will ask staff to review health maintenance and order additional tests/vaccines as indicated    Relevant Orders    Lipid Panel w/o Chol/HDL Ratio (Completed)    Comprehensive metabolic panel (Completed)    CBC with Differential/Platelet (Completed)    TSH (Completed)    Breast cancer screening        monthly SBE, yearly mammograms; CBE done today; mammo order, number given to patient to schedule    Relevant Orders    MM DIGITAL SCREENING BILATERAL    Obesity        Relevant Orders    TSH (Completed)       Orders Placed This Encounter  Procedures  . MM DIGITAL SCREENING BILATERAL  . Lipid Panel w/o Chol/HDL Ratio  . Comprehensive metabolic panel  . Ferritin  . CBC with Differential/Platelet  . TSH   Meds ordered this encounter  Medications  . estradiol (ESTRACE) 1 MG tablet    Sig: Take 1 mg by mouth at bedtime.   Marland Kitchen DISCONTD: rOPINIRole (REQUIP) 1 MG tablet    Sig: Take 1 mg by mouth 2 (two) times a week.  Marland Kitchen rOPINIRole (REQUIP) 1 MG tablet    Sig: Take 1 tablet (1 mg total) by mouth 3 (three) times daily.    Dispense:  90 tablet    Refill:  0   Follow up plan: Return in about 1 year (around 12/26/2015) for next physical.  -------------------------------------  For the table below, mammogram was ordered; flu shot recommended every fall at visit   I will ask staff on Monday to review the rest of these with patient and bring her back for what else is needed; I am not sure why pap smear says due in 2021 (she is s/p hyst); tetanus should have been offered and given here in the office at  the time of her visit, so she will be invited back for that if not up-to-date; shingles vaccine will also be offered; colonoscopy needs to be ordered if not up-to-date Dr. Sanda Klein Health Maintenance  Topic Date Due  . HIV Screening  12/31/1966  . MAMMOGRAM  12/30/1969  . TETANUS/TDAP  12/31/1970  . COLONOSCOPY  12/30/2001  . ZOSTAVAX  12/31/2011  . PAP SMEAR  12/26/2019 (Originally 12/30/1969)  . INFLUENZA VACCINE  01/06/2015

## 2014-12-27 ENCOUNTER — Encounter: Payer: Self-pay | Admitting: Family Medicine

## 2014-12-27 LAB — COMPREHENSIVE METABOLIC PANEL
A/G RATIO: 1.9 (ref 1.1–2.5)
ALBUMIN: 4.5 g/dL (ref 3.6–4.8)
ALT: 27 IU/L (ref 0–32)
AST: 24 IU/L (ref 0–40)
Alkaline Phosphatase: 80 IU/L (ref 39–117)
BUN/Creatinine Ratio: 18 (ref 11–26)
BUN: 15 mg/dL (ref 8–27)
Bilirubin Total: 0.3 mg/dL (ref 0.0–1.2)
CO2: 24 mmol/L (ref 18–29)
Calcium: 9.7 mg/dL (ref 8.7–10.3)
Chloride: 102 mmol/L (ref 97–108)
Creatinine, Ser: 0.84 mg/dL (ref 0.57–1.00)
GFR calc non Af Amer: 75 mL/min/{1.73_m2} (ref >59)
GFR, EST AFRICAN AMERICAN: 86 mL/min/{1.73_m2} (ref >59)
GLOBULIN, TOTAL: 2.4 g/dL (ref 1.5–4.5)
GLUCOSE: 92 mg/dL (ref 65–99)
Potassium: 4.8 mmol/L (ref 3.5–5.2)
Sodium: 144 mmol/L (ref 134–144)
Total Protein: 6.9 g/dL (ref 6.0–8.5)

## 2014-12-27 LAB — CBC WITH DIFFERENTIAL/PLATELET
BASOS: 1 %
Basophils Absolute: 0 10*3/uL (ref 0.0–0.2)
EOS (ABSOLUTE): 0.1 10*3/uL (ref 0.0–0.4)
EOS: 1 %
HEMOGLOBIN: 13.9 g/dL (ref 11.1–15.9)
Hematocrit: 40.7 % (ref 34.0–46.6)
IMMATURE GRANULOCYTES: 0 %
Immature Grans (Abs): 0 10*3/uL (ref 0.0–0.1)
Lymphocytes Absolute: 2.6 10*3/uL (ref 0.7–3.1)
Lymphs: 31 %
MCH: 30.3 pg (ref 26.6–33.0)
MCHC: 34.2 g/dL (ref 31.5–35.7)
MCV: 89 fL (ref 79–97)
MONOS ABS: 0.6 10*3/uL (ref 0.1–0.9)
Monocytes: 7 %
NEUTROS ABS: 5 10*3/uL (ref 1.4–7.0)
NEUTROS PCT: 60 %
PLATELETS: 252 10*3/uL (ref 150–379)
RBC: 4.58 x10E6/uL (ref 3.77–5.28)
RDW: 13.6 % (ref 12.3–15.4)
WBC: 8.3 10*3/uL (ref 3.4–10.8)

## 2014-12-27 LAB — FERRITIN: Ferritin: 136 ng/mL (ref 15–150)

## 2014-12-27 LAB — LIPID PANEL W/O CHOL/HDL RATIO
CHOLESTEROL TOTAL: 231 mg/dL — AB (ref 100–199)
HDL: 69 mg/dL (ref >39)
LDL CALC: 139 mg/dL — AB (ref 0–99)
Triglycerides: 113 mg/dL (ref 0–149)
VLDL CHOLESTEROL CAL: 23 mg/dL (ref 5–40)

## 2014-12-27 LAB — TSH: TSH: 2.64 u[IU]/mL (ref 0.450–4.500)

## 2014-12-28 ENCOUNTER — Encounter: Payer: Self-pay | Admitting: Family Medicine

## 2014-12-28 DIAGNOSIS — G2581 Restless legs syndrome: Secondary | ICD-10-CM

## 2014-12-28 DIAGNOSIS — E669 Obesity, unspecified: Secondary | ICD-10-CM | POA: Insufficient documentation

## 2014-12-28 HISTORY — DX: Restless legs syndrome: G25.81

## 2014-12-28 NOTE — Progress Notes (Signed)
Amy, please contact patient on Monday Her health maintenance shows overdue items If she is due for colonoscopy, please order; if not overdue, please document when done and when due next If she is due for tetanus and/or shingles, please invite her to return for vaccine(s) If given, then document when received If she wishes HIV testing and hepatitis C screening (recommended), then please add on to blood in lab Also, it shows she will be due for a pap smear in 2021; can that be fixed? Thank you, Dr. Sanda Klein

## 2014-12-28 NOTE — Assessment & Plan Note (Signed)
Discussed risk of HRT; I will not refill until she gets a mammogram; in the meantime, she can decrease her use, skip every other day; would advocate after her mammogram that we wean even further; she denies hot flashes and there is really no need for continuation of therapy at this time, risks of medicine outweigh benefits

## 2014-12-28 NOTE — Assessment & Plan Note (Signed)
Check ferritin level; she is on a high dose of medicine for her condition, I explained, but she points out her sister takes even more than she does; no changes made in regimen

## 2014-12-28 NOTE — Assessment & Plan Note (Signed)
I explained I am very against the use of phentermine; I encouraged healthy weight loss through guidelines on [email or web address] site; recommended slow modest safe weight loss through lifestyle changes

## 2015-01-14 NOTE — Progress Notes (Signed)
I spoke with patient, she said she had a colonoscopy about 3 years ago. She refused having the HIV or Hep C testing done. She does not remember when her last Teatnus was. She is going to check with her insurance company to see if they cover the shingles vaccine. If they do, then she will come in and get the shingles and teatnus vaccines done.

## 2015-02-12 ENCOUNTER — Other Ambulatory Visit: Payer: Self-pay | Admitting: Family Medicine

## 2015-02-12 NOTE — Telephone Encounter (Signed)
Routing to provider  

## 2015-05-09 ENCOUNTER — Other Ambulatory Visit: Payer: Self-pay | Admitting: Family Medicine

## 2015-05-09 NOTE — Telephone Encounter (Signed)
Patient notified about rx. She is refusing any vaccines, updated health maintenance. Advised patient to please schedule her mammogram and it is recommended every year.

## 2015-05-09 NOTE — Telephone Encounter (Signed)
Your patient.  Thanks 

## 2015-05-09 NOTE — Telephone Encounter (Signed)
Please remind patient to get her mammogram done and any needed health maintenance items See previous phone note about her overdue care services; thank you

## 2015-05-28 ENCOUNTER — Ambulatory Visit: Payer: Self-pay

## 2015-06-11 ENCOUNTER — Ambulatory Visit: Payer: Self-pay

## 2015-06-18 ENCOUNTER — Ambulatory Visit: Payer: Self-pay | Attending: Oncology

## 2015-06-18 ENCOUNTER — Ambulatory Visit
Admission: RE | Admit: 2015-06-18 | Discharge: 2015-06-18 | Disposition: A | Discharge status: Home / Self Care | Payer: Self-pay | Source: Ambulatory Visit | Attending: Oncology | Admitting: Oncology

## 2015-06-18 VITALS — BP 128/80 | HR 92 | Temp 96.2°F | Resp 16 | Ht 66.93 in | Wt 235.8 lb

## 2015-06-18 DIAGNOSIS — Z0000 Encounter for general adult medical examination without abnormal findings: Secondary | ICD-10-CM

## 2015-06-18 IMAGING — MG MM DIGITAL SCREENING BILAT W/ CAD
4 series · 4 of 4 positions shown · non-contrast
Comparison: Previous exam(s).

CLINICAL DATA: Screening.

EXAM:
DIGITAL SCREENING BILATERAL MAMMOGRAM WITH CAD

[L MLO]
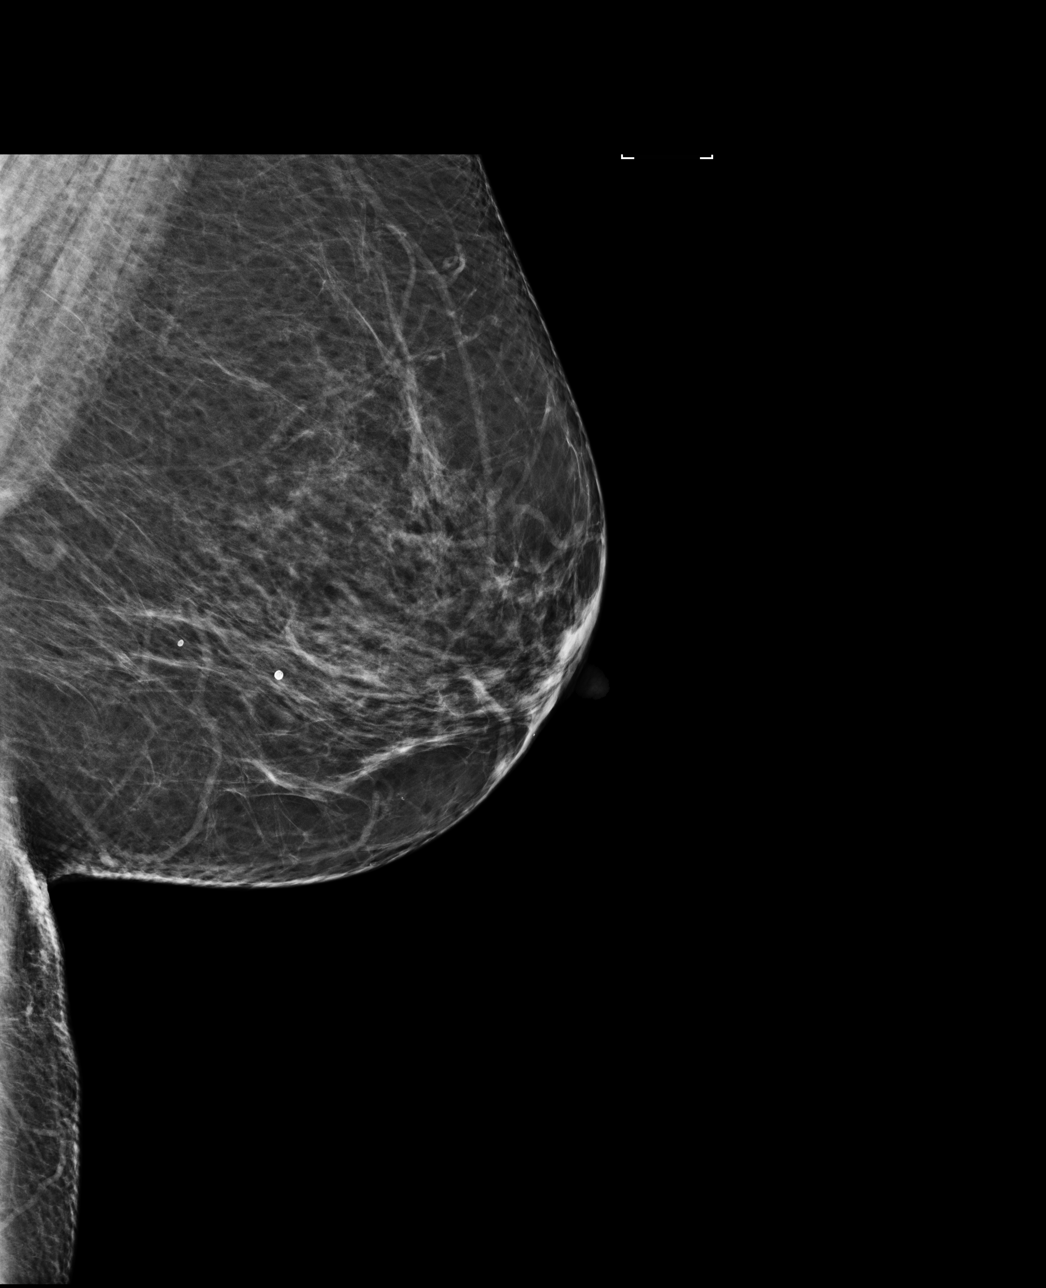

[R MLO]
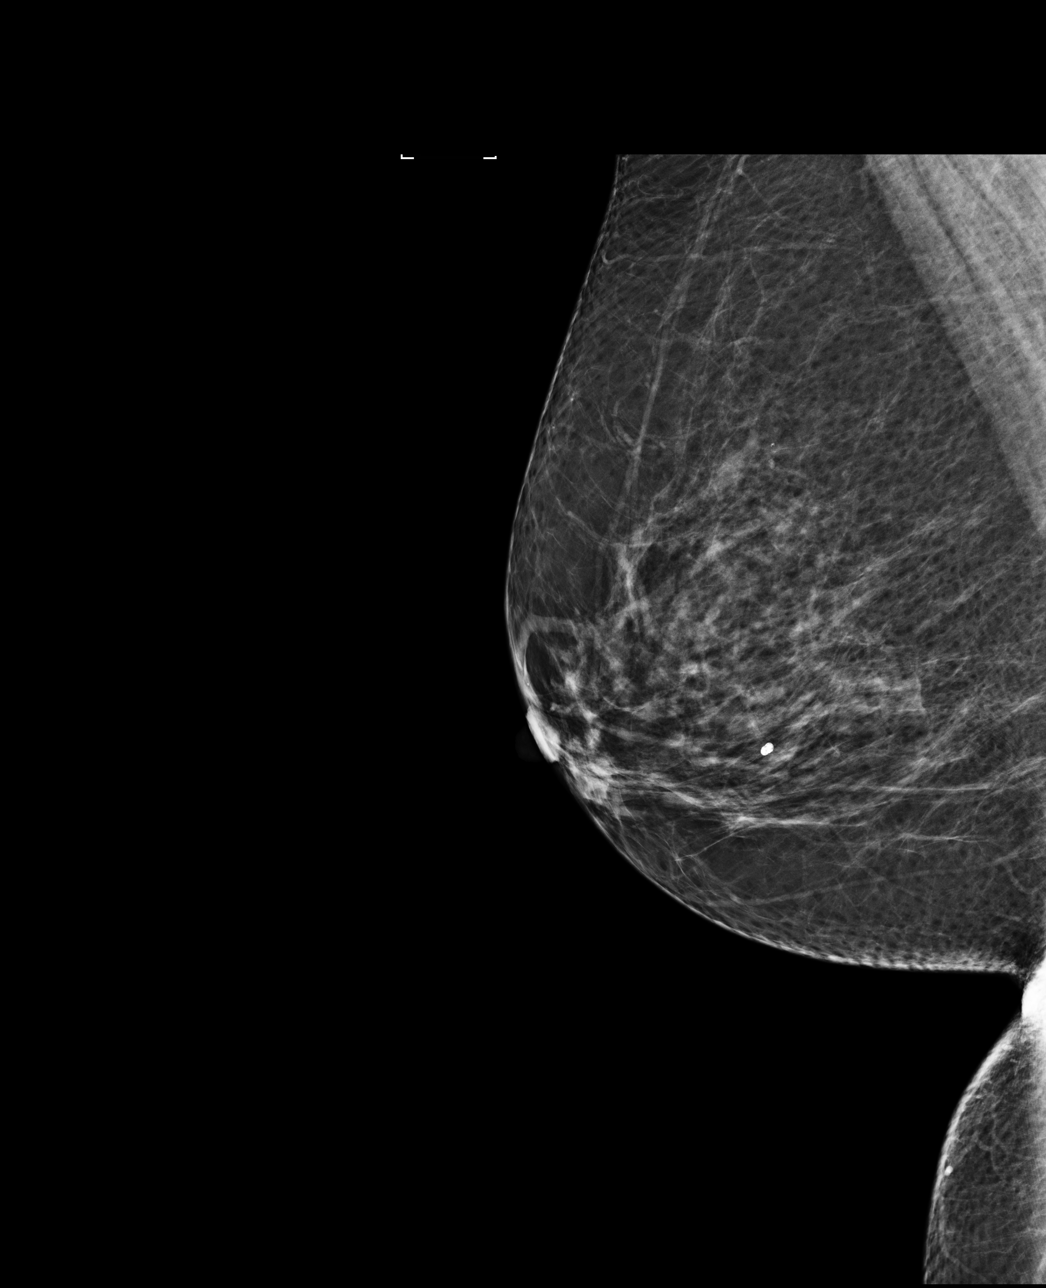

[R CC]
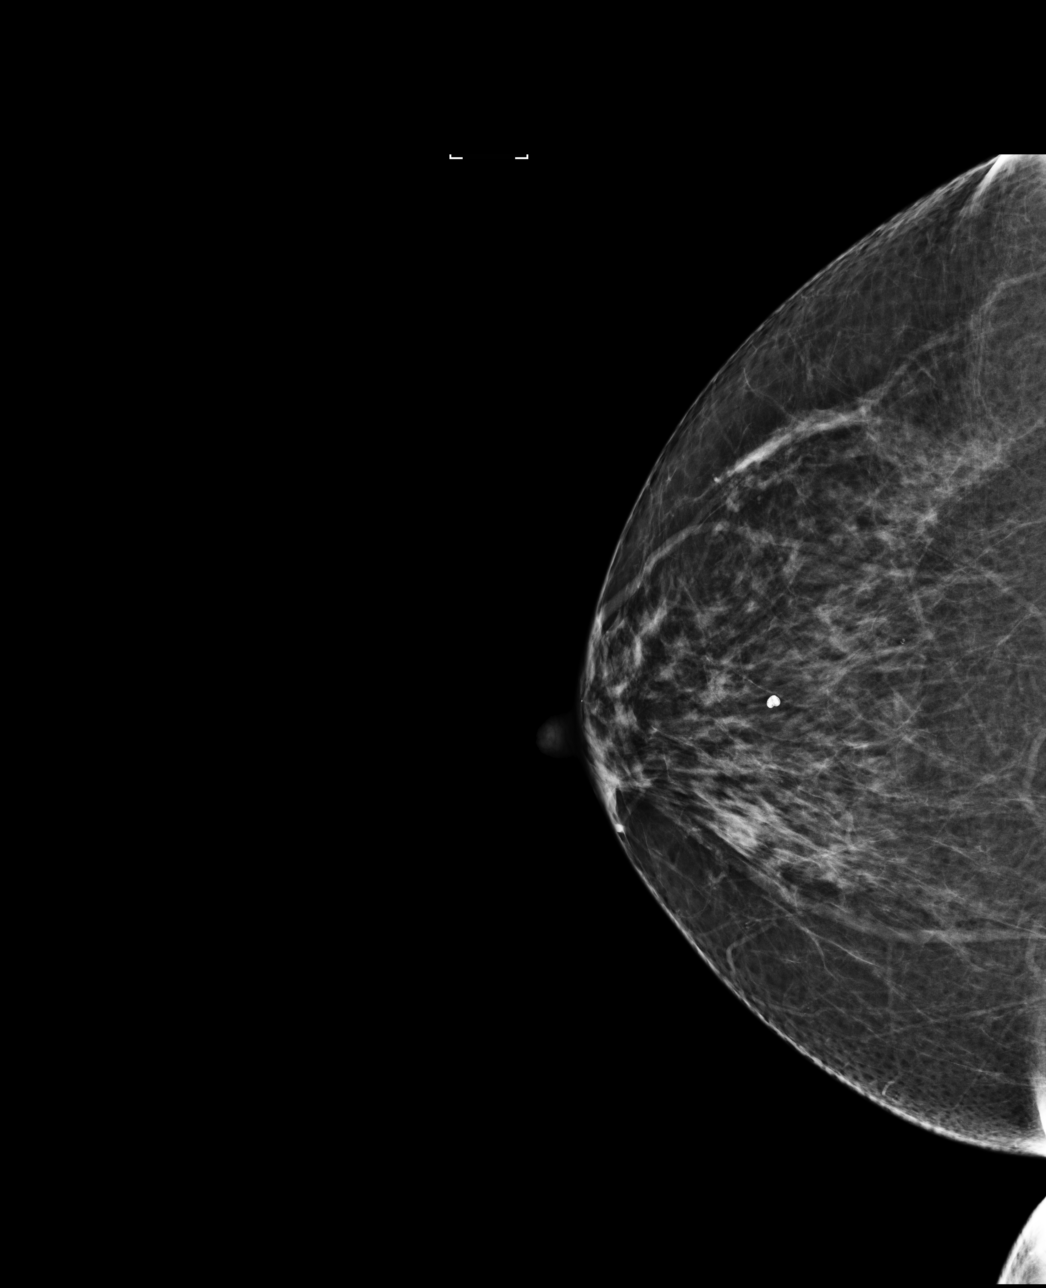

[L CC]
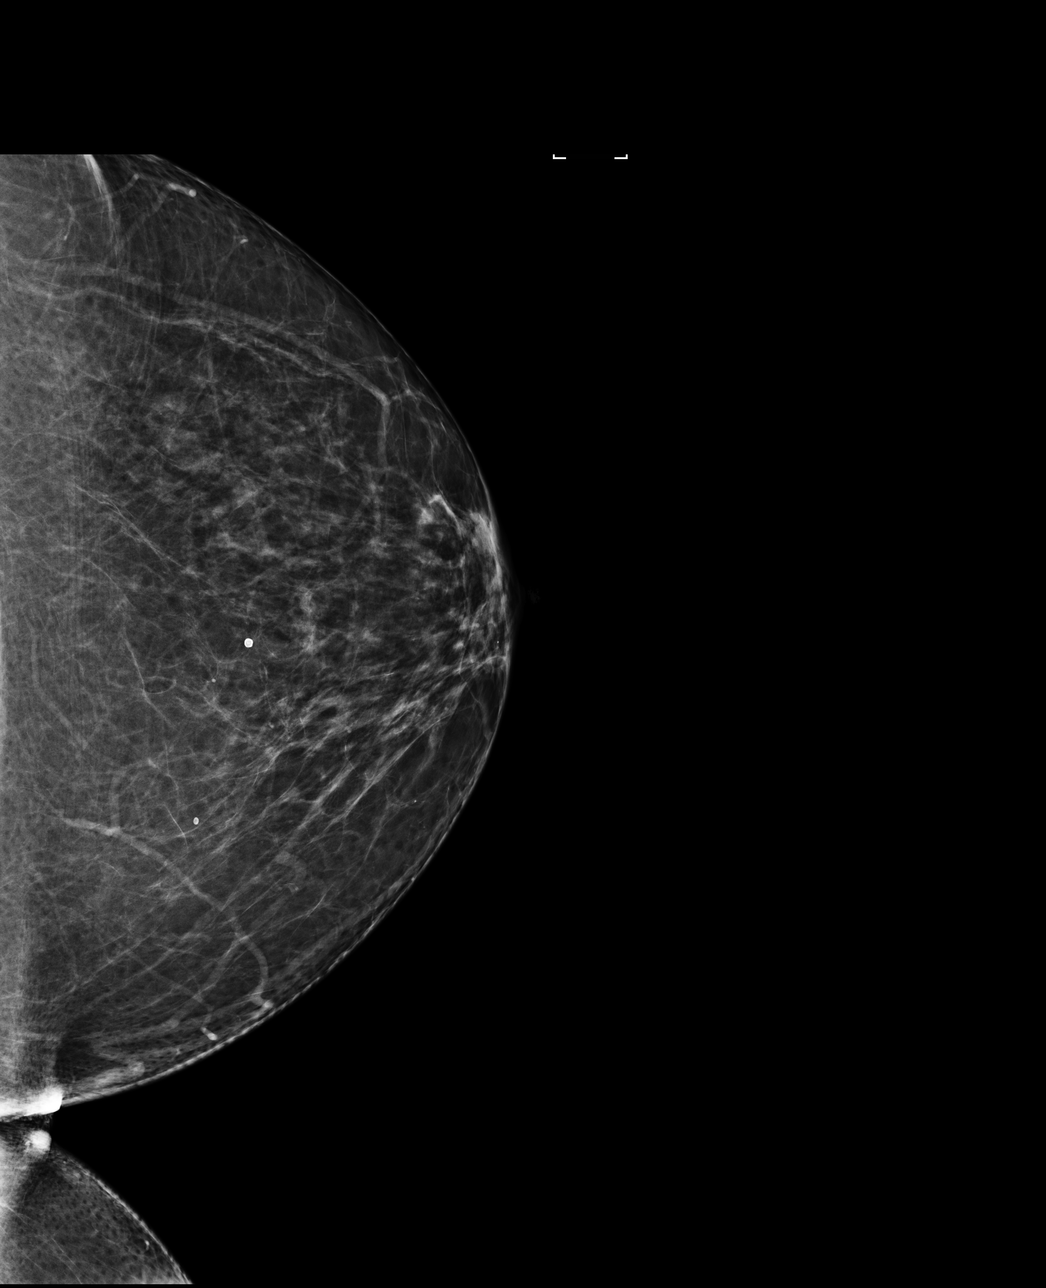

[4 of 4 positions shown; findings below may reference images not displayed]

ACR Breast Density Category b: There are scattered areas of
fibroglandular density.
FINDINGS: There are no findings suspicious for malignancy. Images were
processed with CAD.
IMPRESSION: No mammographic evidence of malignancy. A result letter of this
screening mammogram will be mailed directly to the patient.

RECOMMENDATION:
Screening mammogram in one year. (Code:[US])

BI-RADS CATEGORY  1: Negative.

## 2015-06-18 NOTE — Progress Notes (Signed)
Subjective:     Patient ID: Erika Anderson, female   DOB: 1950/09/11, 65 y.o.   MRN: [id number]  HPI   Review of Systems     Objective:   Physical Exam  Pulmonary/Chest: Right breast exhibits no inverted nipple, no mass, no nipple discharge, no skin change and no tenderness. Left breast exhibits no inverted nipple, no mass, no nipple discharge, no skin change and no tenderness. Breasts are symmetrical.       Assessment:     65 year old patient presents for [facility] clinic visit.  Patient screened, and meets BCCCP eligibility.  Patient does not have insurance, Medicare or Medicaid.  Handout given on Affordable Care Act. CBE unremarkable.  Instructed patient on breast self-exam using teach back method.  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    Plan:     Sent for bilateral screening mammogram.

## 2015-06-19 NOTE — Progress Notes (Signed)
Letter mailed from [facility] to notify of normal mammogram results.  Patient to return in one year for annual screening.  Copy to [facility]. 

## 2015-08-10 ENCOUNTER — Other Ambulatory Visit: Payer: Self-pay | Admitting: Family Medicine

## 2015-10-06 ENCOUNTER — Other Ambulatory Visit: Payer: Self-pay | Admitting: Family Medicine

## 2015-12-30 ENCOUNTER — Ambulatory Visit (INDEPENDENT_AMBULATORY_CARE_PROVIDER_SITE_OTHER): Payer: PRIVATE HEALTH INSURANCE | Admitting: Unknown Physician Specialty

## 2015-12-30 ENCOUNTER — Encounter: Payer: Self-pay | Admitting: Unknown Physician Specialty

## 2015-12-30 VITALS — BP 128/85 | HR 73 | Temp 98.0°F | Ht 65.8 in | Wt 235.4 lb

## 2015-12-30 DIAGNOSIS — E669 Obesity, unspecified: Secondary | ICD-10-CM | POA: Diagnosis not present

## 2015-12-30 DIAGNOSIS — G2581 Restless legs syndrome: Secondary | ICD-10-CM | POA: Diagnosis not present

## 2015-12-30 DIAGNOSIS — Z23 Encounter for immunization: Secondary | ICD-10-CM

## 2015-12-30 DIAGNOSIS — Z0000 Encounter for general adult medical examination without abnormal findings: Secondary | ICD-10-CM

## 2015-12-30 DIAGNOSIS — E7800 Pure hypercholesterolemia, unspecified: Secondary | ICD-10-CM | POA: Diagnosis not present

## 2015-12-30 MED ORDER — ROPINIROLE HCL 1 MG PO TABS: 1.0000 mg | ORAL_TABLET | Freq: Three times a day (TID) | ORAL | 3 refills | Status: DC

## 2015-12-30 NOTE — Progress Notes (Signed)
BP 128/85 (BP Location: Left Arm, Patient Position: Sitting, Cuff Size: Large)   Pulse 73   Temp 98 F (36.7 C)   Ht 5' 5.8" (1.671 m)   Wt 235 lb 6.4 oz (106.8 kg)   SpO2 97%   BMI 38.23 kg/m    Subjective:    Patient ID: Erika Anderson, female    DOB: [birth year], 65 y.o.   MRN: [id number]  HPI: Erika Anderson is a 65 y.o. female  Social History   Social History  . Marital status: Married    Spouse name: N/A  . Number of children: N/A  . Years of education: N/A   Social History Main Topics  . Smoking status: Never Smoker  . Smokeless tobacco: Never Used  . Alcohol use No  . Drug use: No  . Sexual activity: No   Other Topics Concern  . None   Social History Narrative  . None   Family History  Problem Relation Age of Onset  . Cancer Mother     ovarian  . Arthritis Sister   . Heart disease Brother     MI  . Stroke Paternal Grandmother   . Cancer Paternal Grandfather     lung   Past Medical History:  Diagnosis Date  . Obesity (BMI 30-39.9)   . Restless legs syndrome (RLS) 12/28/2014  . RLS (restless legs syndrome)    Past Surgical History:  Procedure Laterality Date  . ABDOMINAL HYSTERECTOMY     ovaries remain  . HEMORROIDECTOMY     Relevant past medical, surgical, family and social history reviewed and updated as indicated. Interim medical history since our last visit reviewed. Allergies and medications reviewed and updated.  Obesity States she is out in the yard all day.  She eats a full breakfast and a reasonable lunch or supper.  She has cut out her sweet tea and admits to cereal or ice cream at night.  She does say she lost weight through a vegan diet.    Review of Systems  Constitutional: Negative.   HENT: Negative.   Eyes: Negative.   Respiratory: Negative.   Cardiovascular: Negative.   Gastrointestinal: Negative.   Endocrine: Negative.   Genitourinary: Negative.   Musculoskeletal: Positive for arthralgias.       Takes Requip  TID.    Knees are giving problems.  States she is using an OTC product which is helping  Skin: Negative.   Neurological: Negative.   Hematological: Negative.   Psychiatric/Behavioral: Negative.     Per HPI unless specifically indicated above     Objective:    BP 128/85 (BP Location: Left Arm, Patient Position: Sitting, Cuff Size: Large)   Pulse 73   Temp 98 F (36.7 C)   Ht 5' 5.8" (1.671 m)   Wt 235 lb 6.4 oz (106.8 kg)   SpO2 97%   BMI 38.23 kg/m   Wt Readings from Last 3 Encounters:  12/30/15 235 lb 6.4 oz (106.8 kg)  06/18/15 235 lb 12.5 oz (106.9 kg)  12/26/14 221 lb (100.2 kg)    Physical Exam  Constitutional: She is oriented to person, place, and time. She appears well-developed and well-nourished.  HENT:  Head: Normocephalic and atraumatic.  Eyes: Pupils are equal, round, and reactive to light. Right eye exhibits no discharge. Left eye exhibits no discharge. No scleral icterus.  Neck: Normal range of motion. Neck supple. Carotid bruit is not present. No thyromegaly present.  Cardiovascular: Normal rate, regular rhythm and normal  heart sounds.  Exam reveals no gallop and no friction rub.   No murmur heard. Pulmonary/Chest: Effort normal and breath sounds normal. No respiratory distress. She has no wheezes. She has no rales.  Abdominal: Soft. Bowel sounds are normal. There is no tenderness. There is no rebound.  Genitourinary: No breast swelling, tenderness or discharge.  Musculoskeletal: Normal range of motion.  Lymphadenopathy:    She has no cervical adenopathy.  Neurological: She is alert and oriented to person, place, and time.  Skin: Skin is warm, dry and intact. No rash noted.  Psychiatric: She has a normal mood and affect. Her speech is normal and behavior is normal. Judgment and thought content normal. Cognition and memory are normal.  Nursing note and vitals reviewed.   Results for orders placed or performed in visit on 12/26/14  Lipid Panel w/o  Chol/HDL Ratio  Result Value Ref Range   Cholesterol, Total 231 (H) 100 - 199 mg/dL   Triglycerides 113 0 - 149 mg/dL   HDL 69 >39 mg/dL   VLDL Cholesterol Cal 23 5 - 40 mg/dL   LDL Calculated 139 (H) 0 - 99 mg/dL  Comprehensive metabolic panel  Result Value Ref Range   Glucose 92 65 - 99 mg/dL   BUN 15 8 - 27 mg/dL   Creatinine, Ser 0.84 0.57 - 1.00 mg/dL   GFR calc non Af Amer 75 >59 mL/min/1.73   GFR calc Af Amer 86 >59 mL/min/1.73   BUN/Creatinine Ratio 18 11 - 26   Sodium 144 134 - 144 mmol/L   Potassium 4.8 3.5 - 5.2 mmol/L   Chloride 102 97 - 108 mmol/L   CO2 24 18 - 29 mmol/L   Calcium 9.7 8.7 - 10.3 mg/dL   Total Protein 6.9 6.0 - 8.5 g/dL   Albumin 4.5 3.6 - 4.8 g/dL   Globulin, Total 2.4 1.5 - 4.5 g/dL   Albumin/Globulin Ratio 1.9 1.1 - 2.5   Bilirubin Total 0.3 0.0 - 1.2 mg/dL   Alkaline Phosphatase 80 39 - 117 IU/L   AST 24 0 - 40 IU/L   ALT 27 0 - 32 IU/L  Ferritin  Result Value Ref Range   Ferritin 136 15 - 150 ng/mL  CBC with Differential/Platelet  Result Value Ref Range   WBC 8.3 3.4 - 10.8 x10E3/uL   RBC 4.58 3.77 - 5.28 x10E6/uL   Hemoglobin 13.9 11.1 - 15.9 g/dL   Hematocrit 40.7 34.0 - 46.6 %   MCV 89 79 - 97 fL   MCH 30.3 26.6 - 33.0 pg   MCHC 34.2 31.5 - 35.7 g/dL   RDW 13.6 12.3 - 15.4 %   Platelets 252 150 - 379 x10E3/uL   Neutrophils 60 %   Lymphs 31 %   Monocytes 7 %   Eos 1 %   Basos 1 %   Neutrophils Absolute 5.0 1.4 - 7.0 x10E3/uL   Lymphocytes Absolute 2.6 0.7 - 3.1 x10E3/uL   Monocytes Absolute 0.6 0.1 - 0.9 x10E3/uL   EOS (ABSOLUTE) 0.1 0.0 - 0.4 x10E3/uL   Basophils Absolute 0.0 0.0 - 0.2 x10E3/uL   Immature Granulocytes 0 %   Immature Grans (Abs) 0.0 0.0 - 0.1 x10E3/uL  TSH  Result Value Ref Range   TSH 2.640 0.450 - 4.500 uIU/mL      Assessment & Plan:   Problem List Items Addressed This Visit      Unprioritized   Hypercholesteremia    Recheck today.  Last year not in statin benefit  group      Relevant Orders    Lipid Panel w/o Chol/HDL Ratio   Obesity (BMI 30-39.9)    Discussed diet changes      Restless legs syndrome (RLS)    Continue Requip       Other Visit Diagnoses    Need for diphtheria-tetanus-pertussis (Tdap) vaccine, adult/adolescent    -  Primary   Relevant Orders   Tdap vaccine greater than or equal to 7yo IM (Completed)   Annual physical exam       Relevant Orders   CBC with Differential/Platelet   Comprehensive metabolic panel   TSH       Follow up plan: Return in about 1 year (around 12/29/2016).

## 2015-12-30 NOTE — Assessment & Plan Note (Signed)
Recheck today.  Last year not in statin benefit group

## 2015-12-30 NOTE — Assessment & Plan Note (Signed)
Discussed diet changes.   

## 2015-12-30 NOTE — Patient Instructions (Addendum)
Tdap Vaccine (Tetanus, Diphtheria and Pertussis): What You Need to Know 1. Why get vaccinated? Tetanus, diphtheria and pertussis are very serious diseases. Tdap vaccine can protect us from these diseases. And, Tdap vaccine given to pregnant women can protect newborn babies against pertussis. TETANUS (Lockjaw) is rare in the [place] today. It causes painful muscle tightening and stiffness, usually all over the body.  It can lead to tightening of muscles in the head and neck so you can't open your mouth, swallow, or sometimes even breathe. Tetanus kills about 1 out of 10 people who are infected even after receiving the best medical care. DIPHTHERIA is also rare in the [place] today. It can cause a thick coating to form in the back of the throat.  It can lead to breathing problems, heart failure, paralysis, and death. PERTUSSIS (Whooping Cough) causes severe coughing spells, which can cause difficulty breathing, vomiting and disturbed sleep.  It can also lead to weight loss, incontinence, and rib fractures. Up to 2 in 100 adolescents and 5 in 100 adults with pertussis are hospitalized or have complications, which could include pneumonia or death. These diseases are caused by bacteria. Diphtheria and pertussis are spread from person to person through secretions from coughing or sneezing. Tetanus enters the body through cuts, scratches, or wounds. Before vaccines, as many as 200,000 cases of diphtheria, 200,000 cases of pertussis, and hundreds of cases of tetanus, were reported in the [place] each year. Since vaccination began, reports of cases for tetanus and diphtheria have dropped by about 99% and for pertussis by about 80%. 2. Tdap vaccine Tdap vaccine can protect adolescents and adults from tetanus, diphtheria, and pertussis. One dose of Tdap is routinely given at age 11 or 12. People who did not get Tdap at that age should get it as soon as possible. Tdap is especially important  for healthcare professionals and anyone having close contact with a baby younger than 12 months. Pregnant women should get a dose of Tdap during every pregnancy, to protect the newborn from pertussis. Infants are most at risk for severe, life-threatening complications from pertussis. Another vaccine, called Td, protects against tetanus and diphtheria, but not pertussis. A Td booster should be given every 10 years. Tdap may be given as one of these boosters if you have never gotten Tdap before. Tdap may also be given after a severe cut or burn to prevent tetanus infection. Your doctor or the person giving you the vaccine can give you more information. Tdap may safely be given at the same time as other vaccines. 3. Some people should not get this vaccine  A person who has ever had a life-threatening allergic reaction after a previous dose of any diphtheria, tetanus or pertussis containing vaccine, OR has a severe allergy to any part of this vaccine, should not get Tdap vaccine. Tell the person giving the vaccine about any severe allergies.  Anyone who had coma or long repeated seizures within 7 days after a childhood dose of DTP or DTaP, or a previous dose of Tdap, should not get Tdap, unless a cause other than the vaccine was found. They can still get Td.  Talk to your doctor if you:  have seizures or another nervous system problem,  had severe pain or swelling after any vaccine containing diphtheria, tetanus or pertussis,  ever had a condition called Guillain-Barr Syndrome (GBS),  aren't feeling well on the day the shot is scheduled. 4. Risks With any medicine, including vaccines, there is   a chance of side effects. These are usually mild and go away on their own. Serious reactions are also possible but are rare. Most people who get Tdap vaccine do not have any problems with it. Mild problems following Tdap (Did not interfere with activities)  Pain where the shot was given (about 3 in 4  adolescents or 2 in 3 adults)  Redness or swelling where the shot was given (about 1 person in 5)  Mild fever of at least 100.4F (up to about 1 in 25 adolescents or 1 in 100 adults)  Headache (about 3 or 4 people in 10)  Tiredness (about 1 person in 3 or 4)  Nausea, vomiting, diarrhea, stomach ache (up to 1 in 4 adolescents or 1 in 10 adults)  Chills, sore joints (about 1 person in 10)  Body aches (about 1 person in 3 or 4)  Rash, swollen glands (uncommon) Moderate problems following Tdap (Interfered with activities, but did not require medical attention)  Pain where the shot was given (up to 1 in 5 or 6)  Redness or swelling where the shot was given (up to about 1 in 16 adolescents or 1 in 12 adults)  Fever over 102F (about 1 in 100 adolescents or 1 in 250 adults)  Headache (about 1 in 7 adolescents or 1 in 10 adults)  Nausea, vomiting, diarrhea, stomach ache (up to 1 or 3 people in 100)  Swelling of the entire arm where the shot was given (up to about 1 in 500). Severe problems following Tdap (Unable to perform usual activities; required medical attention)  Swelling, severe pain, bleeding and redness in the arm where the shot was given (rare). Problems that could happen after any vaccine:  People sometimes faint after a medical procedure, including vaccination. Sitting or lying down for about 15 minutes can help prevent fainting, and injuries caused by a fall. Tell your doctor if you feel dizzy, or have vision changes or ringing in the ears.  Some people get severe pain in the shoulder and have difficulty moving the arm where a shot was given. This happens very rarely.  Any medication can cause a severe allergic reaction. Such reactions from a vaccine are very rare, estimated at fewer than 1 in a million doses, and would happen within a few minutes to a few hours after the vaccination. As with any medicine, there is a very remote chance of a vaccine causing a serious  injury or death. The safety of vaccines is always being monitored. For more information, visit: www.cdc.gov/vaccinesafety/ 5. What if there is a serious problem? What should I look for?  Look for anything that concerns you, such as signs of a severe allergic reaction, very high fever, or unusual behavior.  Signs of a severe allergic reaction can include hives, swelling of the face and throat, difficulty breathing, a fast heartbeat, dizziness, and weakness. These would usually start a few minutes to a few hours after the vaccination. What should I do?  If you think it is a severe allergic reaction or other emergency that can't wait, call 9-1-1 or get the person to the nearest hospital. Otherwise, call your doctor.  Afterward, the reaction should be reported to the Vaccine Adverse Event Reporting System (VAERS). Your doctor might file this report, or you can do it yourself through the VAERS web site at www.vaers.hhs.gov, or by calling 1-800-822-7967. VAERS does not give medical advice.  6. The National Vaccine Injury Compensation Program The National Vaccine Injury Compensation Program (  VICP) is a federal program that was created to compensate people who may have been injured by certain vaccines. Persons who believe they may have been injured by a vaccine can learn about the program and about filing a claim by calling 1-800-338-2382 or visiting the VICP website at www.hrsa.gov/vaccinecompensation. There is a time limit to file a claim for compensation. 7. How can I learn more?  Ask your doctor. He or she can give you the vaccine package insert or suggest other sources of information.  Call your local or state health department.  Contact the Centers for Disease Control and Prevention (CDC):  Call 1-800-232-4636 (1-800-CDC-INFO) or  Visit CDC's website at www.cdc.gov/vaccines CDC Tdap Vaccine VIS (07/31/13)   This information is not intended to replace advice given to you by your health care  provider. Make sure you discuss any questions you have with your health care provider.   Document Released: 11/23/2011 Document Revised: 06/14/2014 Document Reviewed: 09/05/2013 Elsevier Interactive Patient Education 2016 Elsevier Inc.  

## 2015-12-30 NOTE — Assessment & Plan Note (Signed)
Continue Requip. 

## 2015-12-31 ENCOUNTER — Encounter: Payer: Self-pay | Admitting: Unknown Physician Specialty

## 2015-12-31 ENCOUNTER — Encounter: Payer: PRIVATE HEALTH INSURANCE | Admitting: Family Medicine

## 2015-12-31 LAB — CBC WITH DIFFERENTIAL/PLATELET
BASOS: 0 %
Basophils Absolute: 0 10*3/uL (ref 0.0–0.2)
EOS (ABSOLUTE): 0.1 10*3/uL (ref 0.0–0.4)
Eos: 1 %
HEMOGLOBIN: 14.2 g/dL (ref 11.1–15.9)
Hematocrit: 42.3 % (ref 34.0–46.6)
IMMATURE GRANS (ABS): 0 10*3/uL (ref 0.0–0.1)
Immature Granulocytes: 0 %
LYMPHS: 35 %
Lymphocytes Absolute: 2.5 10*3/uL (ref 0.7–3.1)
MCH: 30.2 pg (ref 26.6–33.0)
MCHC: 33.6 g/dL (ref 31.5–35.7)
MCV: 90 fL (ref 79–97)
MONOCYTES: 8 %
Monocytes Absolute: 0.6 10*3/uL (ref 0.1–0.9)
NEUTROS ABS: 3.9 10*3/uL (ref 1.4–7.0)
Neutrophils: 56 %
Platelets: 258 10*3/uL (ref 150–379)
RBC: 4.7 x10E6/uL (ref 3.77–5.28)
RDW: 13.6 % (ref 12.3–15.4)
WBC: 7.2 10*3/uL (ref 3.4–10.8)

## 2015-12-31 LAB — COMPREHENSIVE METABOLIC PANEL
ALBUMIN: 4.5 g/dL (ref 3.6–4.8)
ALT: 22 IU/L (ref 0–32)
AST: 22 IU/L (ref 0–40)
Albumin/Globulin Ratio: 1.9 (ref 1.2–2.2)
Alkaline Phosphatase: 86 IU/L (ref 39–117)
BUN / CREAT RATIO: 16 (ref 12–28)
BUN: 14 mg/dL (ref 8–27)
Bilirubin Total: 0.4 mg/dL (ref 0.0–1.2)
CO2: 26 mmol/L (ref 18–29)
CREATININE: 0.85 mg/dL (ref 0.57–1.00)
Calcium: 9.8 mg/dL (ref 8.7–10.3)
Chloride: 102 mmol/L (ref 96–106)
GFR, EST AFRICAN AMERICAN: 84 mL/min/{1.73_m2} (ref >59)
GFR, EST NON AFRICAN AMERICAN: 73 mL/min/{1.73_m2} (ref >59)
GLOBULIN, TOTAL: 2.4 g/dL (ref 1.5–4.5)
Glucose: 100 mg/dL — ABNORMAL HIGH (ref 65–99)
Potassium: 5.1 mmol/L (ref 3.5–5.2)
SODIUM: 140 mmol/L (ref 134–144)
TOTAL PROTEIN: 6.9 g/dL (ref 6.0–8.5)

## 2015-12-31 LAB — TSH: TSH: 3.02 u[IU]/mL (ref 0.450–4.500)

## 2015-12-31 LAB — LIPID PANEL W/O CHOL/HDL RATIO
Cholesterol, Total: 226 mg/dL — ABNORMAL HIGH (ref 100–199)
HDL: 59 mg/dL (ref >39)
LDL CALC: 136 mg/dL — AB (ref 0–99)
Triglycerides: 154 mg/dL — ABNORMAL HIGH (ref 0–149)
VLDL CHOLESTEROL CAL: 31 mg/dL (ref 5–40)

## 2016-03-09 ENCOUNTER — Ambulatory Visit (INDEPENDENT_AMBULATORY_CARE_PROVIDER_SITE_OTHER): Payer: PRIVATE HEALTH INSURANCE | Admitting: Unknown Physician Specialty

## 2016-03-09 ENCOUNTER — Encounter: Payer: Self-pay | Admitting: Unknown Physician Specialty

## 2016-03-09 VITALS — BP 124/72 | HR 71 | Temp 98.2°F | Ht 66.8 in | Wt 234.8 lb

## 2016-03-09 DIAGNOSIS — R110 Nausea: Secondary | ICD-10-CM

## 2016-03-09 DIAGNOSIS — R42 Dizziness and giddiness: Secondary | ICD-10-CM | POA: Diagnosis not present

## 2016-03-09 DIAGNOSIS — Z23 Encounter for immunization: Secondary | ICD-10-CM | POA: Diagnosis not present

## 2016-03-09 NOTE — Progress Notes (Signed)
BP 124/72 (BP Location: Left Arm, Patient Position: Sitting, Cuff Size: Large)   Pulse 71   Temp 98.2 F (36.8 C)   Ht 5' 6.8" (1.697 m) Comment: with shoes  Wt 234 lb 12.8 oz (106.5 kg) Comment: with shoes  SpO2 98%   BMI 37.00 kg/m    Subjective:    Patient ID: Erika Anderson, female    DOB: March 28, 1951, 65 y.o.   MRN: [id number]  HPI: Erika Anderson is a 65 y.o. female  Chief Complaint  Patient presents with  . Arm Pain    pt states she had a tingling sensation in her left arm last tuesday. States she was also nauseaous that night.   States it started earlier in the day and got worse as the day went on.  She was nauseated that day.  Started taking ASA and fish oil.  She was also pale and felt faint.  "A little" chest pain.  No SOB.    Relevant past medical, surgical, family and social history reviewed and updated as indicated. Interim medical history since our last visit reviewed. Allergies and medications reviewed and updated.  Review of Systems  Per HPI unless specifically indicated above     Objective:    BP 124/72 (BP Location: Left Arm, Patient Position: Sitting, Cuff Size: Large)   Pulse 71   Temp 98.2 F (36.8 C)   Ht 5' 6.8" (1.697 m) Comment: with shoes  Wt 234 lb 12.8 oz (106.5 kg) Comment: with shoes  SpO2 98%   BMI 37.00 kg/m   Wt Readings from Last 3 Encounters:  03/09/16 234 lb 12.8 oz (106.5 kg)  12/30/15 235 lb 6.4 oz (106.8 kg)  06/18/15 235 lb 12.5 oz (106.9 kg)    Physical Exam  Constitutional: She is oriented to person, place, and time. She appears well-developed and well-nourished. No distress.  HENT:  Head: Normocephalic and atraumatic.  Eyes: Conjunctivae and lids are normal. Right eye exhibits no discharge. Left eye exhibits no discharge. No scleral icterus.  Neck: Normal range of motion. Neck supple. No JVD present. Carotid bruit is not present.  Cardiovascular: Normal rate, regular rhythm and normal heart sounds.     Pulmonary/Chest: Effort normal and breath sounds normal.  Abdominal: Normal appearance. There is no splenomegaly or hepatomegaly.  Musculoskeletal: Normal range of motion.  Neurological: She is alert and oriented to person, place, and time.  Skin: Skin is warm, dry and intact. No rash noted. No pallor.  Psychiatric: She has a normal mood and affect. Her behavior is normal. Judgment and thought content normal.    Results for orders placed or performed in visit on 12/30/15  CBC with Differential/Platelet  Result Value Ref Range   WBC 7.2 3.4 - 10.8 x10E3/uL   RBC 4.70 3.77 - 5.28 x10E6/uL   Hemoglobin 14.2 11.1 - 15.9 g/dL   Hematocrit 42.3 34.0 - 46.6 %   MCV 90 79 - 97 fL   MCH 30.2 26.6 - 33.0 pg   MCHC 33.6 31.5 - 35.7 g/dL   RDW 13.6 12.3 - 15.4 %   Platelets 258 150 - 379 x10E3/uL   Neutrophils 56 %   Lymphs 35 %   Monocytes 8 %   Eos 1 %   Basos 0 %   Neutrophils Absolute 3.9 1.4 - 7.0 x10E3/uL   Lymphocytes Absolute 2.5 0.7 - 3.1 x10E3/uL   Monocytes Absolute 0.6 0.1 - 0.9 x10E3/uL   EOS (ABSOLUTE) 0.1 0.0 - 0.4 x10E3/uL  Basophils Absolute 0.0 0.0 - 0.2 x10E3/uL   Immature Granulocytes 0 %   Immature Grans (Abs) 0.0 0.0 - 0.1 x10E3/uL  Comprehensive metabolic panel  Result Value Ref Range   Glucose 100 (H) 65 - 99 mg/dL   BUN 14 8 - 27 mg/dL   Creatinine, Ser 0.85 0.57 - 1.00 mg/dL   GFR calc non Af Amer 73 >59 mL/min/1.73   GFR calc Af Amer 84 >59 mL/min/1.73   BUN/Creatinine Ratio 16 12 - 28   Sodium 140 134 - 144 mmol/L   Potassium 5.1 3.5 - 5.2 mmol/L   Chloride 102 96 - 106 mmol/L   CO2 26 18 - 29 mmol/L   Calcium 9.8 8.7 - 10.3 mg/dL   Total Protein 6.9 6.0 - 8.5 g/dL   Albumin 4.5 3.6 - 4.8 g/dL   Globulin, Total 2.4 1.5 - 4.5 g/dL   Albumin/Globulin Ratio 1.9 1.2 - 2.2   Bilirubin Total 0.4 0.0 - 1.2 mg/dL   Alkaline Phosphatase 86 39 - 117 IU/L   AST 22 0 - 40 IU/L   ALT 22 0 - 32 IU/L  Lipid Panel w/o Chol/HDL Ratio  Result Value Ref Range    Cholesterol, Total 226 (H) 100 - 199 mg/dL   Triglycerides 154 (H) 0 - 149 mg/dL   HDL 59 >39 mg/dL   VLDL Cholesterol Cal 31 5 - 40 mg/dL   LDL Calculated 136 (H) 0 - 99 mg/dL  TSH  Result Value Ref Range   TSH 3.020 0.450 - 4.500 uIU/mL      Assessment & Plan:   Problem List Items Addressed This Visit    None    Visit Diagnoses    Need for influenza vaccination    -  Primary   Relevant Orders   Flu Vaccine QUAD 36+ mos IM (Completed)   Feeling faint       Relevant Orders   EKG 12-Lead (Completed)   Nausea          It seems symptoms were transient.  EKG was normal.  Will continue ASA and f/u as needed.    Follow up plan: Return if symptoms worsen or fail to improve.

## 2016-03-09 NOTE — Patient Instructions (Addendum)

## 2016-09-15 ENCOUNTER — Telehealth: Payer: Self-pay | Admitting: Unknown Physician Specialty

## 2016-09-15 MED ORDER — ROPINIROLE HCL 1 MG PO TABS: 1.0000 mg | ORAL_TABLET | Freq: Three times a day (TID) | ORAL | 3 refills | Status: DC

## 2016-09-15 NOTE — Telephone Encounter (Signed)
Routing to provider. Patient requests a printed script that she can come pick up.

## 2016-09-15 NOTE — Telephone Encounter (Signed)
Patient needs Malachy Mood to have her a script for her Requip 1mg  taken as 3xs daily 90 day supply.  She needs this to be handwritten script.  She will pick up when she gets this ready.  Thanks  [phone]

## 2016-12-31 ENCOUNTER — Encounter: Payer: PRIVATE HEALTH INSURANCE | Admitting: Unknown Physician Specialty

## 2017-01-10 DIAGNOSIS — J0110 Acute frontal sinusitis, unspecified: Secondary | ICD-10-CM | POA: Diagnosis not present

## 2017-01-10 DIAGNOSIS — J302 Other seasonal allergic rhinitis: Secondary | ICD-10-CM | POA: Diagnosis not present

## 2017-01-10 DIAGNOSIS — R05 Cough: Secondary | ICD-10-CM | POA: Diagnosis not present

## 2017-01-10 DIAGNOSIS — J3089 Other allergic rhinitis: Secondary | ICD-10-CM | POA: Diagnosis not present

## 2017-01-11 ENCOUNTER — Encounter: Payer: Self-pay | Admitting: Unknown Physician Specialty

## 2017-01-11 ENCOUNTER — Ambulatory Visit (INDEPENDENT_AMBULATORY_CARE_PROVIDER_SITE_OTHER): Payer: Medicare HMO | Admitting: Unknown Physician Specialty

## 2017-01-11 VITALS — BP 139/80 | HR 65 | Temp 98.2°F | Ht 65.7 in | Wt 239.7 lb

## 2017-01-11 DIAGNOSIS — Z23 Encounter for immunization: Secondary | ICD-10-CM | POA: Diagnosis not present

## 2017-01-11 DIAGNOSIS — Z7189 Other specified counseling: Secondary | ICD-10-CM | POA: Insufficient documentation

## 2017-01-11 DIAGNOSIS — E2839 Other primary ovarian failure: Secondary | ICD-10-CM | POA: Diagnosis not present

## 2017-01-11 DIAGNOSIS — E7800 Pure hypercholesterolemia, unspecified: Secondary | ICD-10-CM | POA: Diagnosis not present

## 2017-01-11 DIAGNOSIS — Z1159 Encounter for screening for other viral diseases: Secondary | ICD-10-CM

## 2017-01-11 DIAGNOSIS — Z0000 Encounter for general adult medical examination without abnormal findings: Secondary | ICD-10-CM | POA: Diagnosis not present

## 2017-01-11 DIAGNOSIS — G2581 Restless legs syndrome: Secondary | ICD-10-CM

## 2017-01-11 DIAGNOSIS — Z114 Encounter for screening for human immunodeficiency virus [HIV]: Secondary | ICD-10-CM

## 2017-01-11 DIAGNOSIS — Z1231 Encounter for screening mammogram for malignant neoplasm of breast: Secondary | ICD-10-CM

## 2017-01-11 DIAGNOSIS — R69 Illness, unspecified: Secondary | ICD-10-CM | POA: Diagnosis not present

## 2017-01-11 NOTE — Patient Instructions (Addendum)
Pneumococcal Conjugate Vaccine (PCV13) What You Need to Know 1. Why get vaccinated? Vaccination can protect both children and adults from pneumococcal disease. Pneumococcal disease is caused by bacteria that can spread from person to person through close contact. It can cause ear infections, and it can also lead to more serious infections of the:  Lungs (pneumonia),  Blood (bacteremia), and  Covering of the brain and spinal cord (meningitis).  Pneumococcal pneumonia is most common among adults. Pneumococcal meningitis can cause deafness and brain damage, and it kills about 1 child in 10 who get it. Anyone can get pneumococcal disease, but children under 81 years of age and adults 71 years and older, people with certain medical conditions, and cigarette smokers are at the highest risk. Before there was a vaccine, the [place] saw:  more than 700 cases of meningitis,  about 13,000 blood infections,  about 5 million ear infections, and  about 200 deaths  in children under 5 each year from pneumococcal disease. Since vaccine became available, severe pneumococcal disease in these children has fallen by 88%. About 18,000 older adults die of pneumococcal disease each year in the [place]. Treatment of pneumococcal infections with penicillin and other drugs is not as effective as it used to be, because some strains of the disease have become resistant to these drugs. This makes prevention of the disease, through vaccination, even more important. 2. PCV13 vaccine Pneumococcal conjugate vaccine (called PCV13) protects against 13 types of pneumococcal bacteria. PCV13 is routinely given to children at 2, 4, 6, and 65-16 months of age. It is also recommended for children and adults 44 to 23 years of age with certain health conditions, and for all adults 62 years of age and older. Your doctor can give you details. 3. Some people should not get this vaccine Anyone who has ever had a  life-threatening allergic reaction to a dose of this vaccine, to an earlier pneumococcal vaccine called PCV7, or to any vaccine containing diphtheria toxoid (for example, DTaP), should not get PCV13. Anyone with a severe allergy to any component of PCV13 should not get the vaccine. Tell your doctor if the person being vaccinated has any severe allergies. If the person scheduled for vaccination is not feeling well, your healthcare provider might decide to reschedule the shot on another day. 4. Risks of a vaccine reaction With any medicine, including vaccines, there is a chance of reactions. These are usually mild and go away on their own, but serious reactions are also possible. Problems reported following PCV13 varied by age and dose in the series. The most common problems reported among children were:  About half became drowsy after the shot, had a temporary loss of appetite, or had redness or tenderness where the shot was given.  About 1 out of 3 had swelling where the shot was given.  About 1 out of 3 had a mild fever, and about 1 in 20 had a fever over 102.85F.  Up to about 8 out of 10 became fussy or irritable.  Adults have reported pain, redness, and swelling where the shot was given; also mild fever, fatigue, headache, chills, or muscle pain. Young children who get PCV13 along with inactivated flu vaccine at the same time may be at increased risk for seizures caused by fever. Ask your doctor for more information. Problems that could happen after any vaccine:  People sometimes faint after a medical procedure, including vaccination. Sitting or lying down for about 15 minutes can help prevent  fainting, and injuries caused by a fall. Tell your doctor if you feel dizzy, or have vision changes or ringing in the ears.  Some older children and adults get severe pain in the shoulder and have difficulty moving the arm where a shot was given. This happens very rarely.  Any medication can cause a  severe allergic reaction. Such reactions from a vaccine are very rare, estimated at about 1 in a million doses, and would happen within a few minutes to a few hours after the vaccination. As with any medicine, there is a very small chance of a vaccine causing a serious injury or death. The safety of vaccines is always being monitored. For more information, visit: http://www.aguilar.org/ 5. What if there is a serious reaction? What should I look for? Look for anything that concerns you, such as signs of a severe allergic reaction, very high fever, or unusual behavior. Signs of a severe allergic reaction can include hives, swelling of the face and throat, difficulty breathing, a fast heartbeat, dizziness, and weakness-usually within a few minutes to a few hours after the vaccination. What should I do?  If you think it is a severe allergic reaction or other emergency that can't wait, call 9-1-1 or get the person to the nearest hospital. Otherwise, call your doctor.  Reactions should be reported to the Vaccine Adverse Event Reporting System (VAERS). Your doctor should file this report, or you can do it yourself through the VAERS web site at www.vaers.SamedayNews.es, or by calling (971)221-7808. ? VAERS does not give medical advice. 6. The National Vaccine Injury Compensation Program The [employer] Vaccine Injury Compensation Program (VICP) is a federal program that was created to compensate people who may have been injured by certain vaccines. Persons who believe they may have been injured by a vaccine can learn about the program and about filing a claim by calling (331)503-2275 or visiting the [place] website at GoldCloset.com.ee. There is a time limit to file a claim for compensation. 7. How can I learn more?  Ask your healthcare provider. He or she can give you the vaccine package insert or suggest other sources of information.  Call your local or state health department.  Contact the  Centers for Disease Control and Prevention (CDC): ? Call 702-664-2711 (1-800-CDC-INFO) or ? Visit CDC's website at http://hunter.com/ Vaccine Information Statement, PCV13 Vaccine (04/11/2014) This information is not intended to replace advice given to you by your health care provider. Make sure you discuss any questions you have with your health care provider. ----------------------------------------------------------------- Please do call to schedule your mammogram and bone density; the number to schedule one at either [facility] or [facility] is [phone] ----------------------------------------------------------------   Preventive Care 103 Years and Older, Female Preventive care refers to lifestyle choices and visits with your health care provider that can promote health and wellness. What does preventive care include?  A yearly physical exam. This is also called an annual well check.  Dental exams once or twice a year.  Routine eye exams. Ask your health care provider how often you should have your eyes checked.  Personal lifestyle choices, including: ? Daily care of your teeth and gums. ? Regular physical activity. ? Eating a healthy diet. ? Avoiding tobacco and drug use. ? Limiting alcohol use. ? Practicing safe sex. ? Taking low-dose aspirin every day. ? Taking vitamin and mineral supplements as recommended by your health care provider. What happens during an annual well check? The services and screenings done by  your health care provider during your annual well check will depend on your age, overall health, lifestyle risk factors, and family history of disease. Counseling Your health care provider may ask you questions about your:  Alcohol use.  Tobacco use.  Drug use.  Emotional well-being.  Home and relationship well-being.  Sexual activity.  Eating habits.  History of falls.  Memory and ability to understand  (cognition).  Work and work Statistician.  Reproductive health.  Screening You may have the following tests or measurements:  Height, weight, and BMI.  Blood pressure.  Lipid and cholesterol levels. These may be checked every 5 years, or more frequently if you are over 21 years old.  Skin check.  Lung cancer screening. You may have this screening every year starting at age 62 if you have a 30-pack-year history of smoking and currently smoke or have quit within the past 15 years.  Fecal occult blood test (FOBT) of the stool. You may have this test every year starting at age 73.  Flexible sigmoidoscopy or colonoscopy. You may have a sigmoidoscopy every 5 years or a colonoscopy every 10 years starting at age 37.  Hepatitis C blood test.  Hepatitis B blood test.  Sexually transmitted disease (STD) testing.  Diabetes screening. This is done by checking your blood sugar (glucose) after you have not eaten for a while (fasting). You may have this done every 1-3 years.  Bone density scan. This is done to screen for osteoporosis. You may have this done starting at age 55.  Mammogram. This may be done every 1-2 years. Talk to your health care provider about how often you should have regular mammograms.  Talk with your health care provider about your test results, treatment options, and if necessary, the need for more tests. Vaccines Your health care provider may recommend certain vaccines, such as:  Influenza vaccine. This is recommended every year.  Tetanus, diphtheria, and acellular pertussis (Tdap, Td) vaccine. You may need a Td booster every 10 years.  Varicella vaccine. You may need this if you have not been vaccinated.  Zoster vaccine. You may need this after age 37.  Measles, mumps, and rubella (MMR) vaccine. You may need at least one dose of MMR if you were born in 1957 or later. You may also need a second dose.  Pneumococcal 13-valent conjugate (PCV13) vaccine. One dose  is recommended after age 59.  Pneumococcal polysaccharide (PPSV23) vaccine. One dose is recommended after age 71.  Meningococcal vaccine. You may need this if you have certain conditions.  Hepatitis A vaccine. You may need this if you have certain conditions or if you travel or work in places where you may be exposed to hepatitis A.  Hepatitis B vaccine. You may need this if you have certain conditions or if you travel or work in places where you may be exposed to hepatitis B.  Haemophilus influenzae type b (Hib) vaccine. You may need this if you have certain conditions.  Talk to your health care provider about which screenings and vaccines you need and how often you need them. This information is not intended to replace advice given to you by your health care provider. Make sure you discuss any questions you have with your health care provider. Document Released: 06/20/2015 Document Revised: 02/11/2016 Document Reviewed: 03/25/2015 Elsevier Interactive Patient Education  2017 [employer].

## 2017-01-11 NOTE — Assessment & Plan Note (Signed)
A voluntary discussion about advance care planning including the explanation and discussion of advance directives was extensively discussed  with the patient.  Explanation about the health care proxy and Living will was reviewed and packet with forms with explanation of how to fill them out was given.  During this discussion, the patient was able to identify a health care proxy as her husband and plans to fill out the paperwork required.  Patient was offered a separate Advance Care Planning visit for further assistance with forms.    

## 2017-01-11 NOTE — Progress Notes (Addendum)
BP 139/80   Pulse 65   Temp 98.2 F (36.8 C)   Ht 5' 5.7" (1.669 m)   Wt 239 lb 11.2 oz (108.7 kg)   SpO2 98%   BMI 39.04 kg/m    Subjective:    Patient ID: Erika Anderson, female    DOB: [birth year], 66 y.o.   MRN: [id number]  HPI: Hser Erika Anderson is a 66 y.o. female  Chief Complaint  Patient presents with  . Medicare Wellness   Functional Status Survey: Is the patient deaf or have difficulty hearing?: No Does the patient have difficulty seeing, even when wearing glasses/contacts?: No Does the patient have difficulty concentrating, remembering, or making decisions?: No Does the patient have difficulty walking or climbing stairs?: No (pt states she just takes her time ) Does the patient have difficulty dressing or bathing?: No Does the patient have difficulty doing errands alone such as visiting a doctor's office or shopping?: No Fall Risk  01/11/2017 12/30/2015  Falls in the past year? No No   Depression screen [facility] 2/9 01/11/2017 12/30/2015  Decreased Interest 0 0  Down, Depressed, Hopeless 0 0  PHQ - 2 Score 0 0  Altered sleeping 1 -  Tired, decreased energy 0 -  Change in appetite 1 -  Feeling bad or failure about yourself  0 -  Trouble concentrating 0 -  Moving slowly or fidgety/restless 0 -  Suicidal thoughts 0 -  PHQ-9 Score 2 -   Obesity   RLS  Doing well with requip  Hyperlipidemia Risk calculator shows she is possibly in a statin benefit group The 10-year ASCVD risk score [name] DC Jr., et al., 2013) is: 6.7%   Values used to calculate the score:     Age: 42 years     Sex: Female     Is Non-Hispanic African American: No     Diabetic: No     Tobacco smoker: No     Systolic Blood Pressure: 409 mmHg     Is BP treated: No     HDL Cholesterol: 59 mg/dL     Total Cholesterol: 226 mg/dL  Social History   Social History  . Marital status: Married    Spouse name: N/A  . Number of children: N/A  . Years of education: N/A   Occupational History    . Not on file.   Social History Main Topics  . Smoking status: Never Smoker  . Smokeless tobacco: Never Used  . Alcohol use No  . Drug use: No  . Sexual activity: No   Other Topics Concern  . Not on file   Social History Narrative  . No narrative on file   Family History  Problem Relation Age of Onset  . Cancer Mother        ovarian  . Arthritis Sister   . Heart disease Brother        MI  . Stroke Paternal Grandmother   . Cancer Paternal Grandfather        lung   Past Surgical History:  Procedure Laterality Date  . ABDOMINAL HYSTERECTOMY     ovaries remain  . HEMORROIDECTOMY     Past Medical History:  Diagnosis Date  . Obesity (BMI 30-39.9)   . Restless legs syndrome (RLS) 12/28/2014  . RLS (restless legs syndrome)      Relevant past medical, surgical, family and social history reviewed and updated as indicated. Interim medical history since our last visit reviewed. Allergies and medications reviewed  and updated.  Review of Systems  Per HPI unless specifically indicated above     Objective:    BP 139/80   Pulse 65   Temp 98.2 F (36.8 C)   Ht 5' 5.7" (1.669 m)   Wt 239 lb 11.2 oz (108.7 kg)   SpO2 98%   BMI 39.04 kg/m   Wt Readings from Last 3 Encounters:  01/11/17 239 lb 11.2 oz (108.7 kg)  03/09/16 234 lb 12.8 oz (106.5 kg)  12/30/15 235 lb 6.4 oz (106.8 kg)    Physical Exam  Constitutional: She is oriented to person, place, and time. She appears well-developed and well-nourished. No distress.  HENT:  Head: Normocephalic and atraumatic.  Eyes: Conjunctivae and lids are normal. Right eye exhibits no discharge. Left eye exhibits no discharge. No scleral icterus.  Neck: Normal range of motion. Neck supple. No JVD present. Carotid bruit is not present.  Cardiovascular: Normal rate, regular rhythm and normal heart sounds.   Pulmonary/Chest: Effort normal and breath sounds normal.  Abdominal: Normal appearance. There is no splenomegaly or  hepatomegaly.  Musculoskeletal: Normal range of motion.  Neurological: She is alert and oriented to person, place, and time.  Skin: Skin is warm, dry and intact. No rash noted. No pallor.  Psychiatric: She has a normal mood and affect. Her behavior is normal. Judgment and thought content normal.   EKG: NSR without any acute changes.  Not STTW changes.  No change from previous EKG    Assessment & Plan:   Problem List Items Addressed This Visit      Unprioritized   Advanced care planning/counseling discussion    A voluntary discussion about advance care planning including the explanation and discussion of advance directives was extensively discussed  with the patient.  Explanation about the health care proxy and Living will was reviewed and packet with forms with explanation of how to fill them out was given.  During this discussion, the patient was able to identify a health care proxy as her husband and plans to fill out the paperwork required.  Patient was offered a separate [place] visit for further assistance with forms.         Hypercholesteremia   Relevant Orders   Comprehensive metabolic panel   Lipid Panel w/o Chol/HDL Ratio   Restless legs syndrome (RLS)    Other Visit Diagnoses    Medicare annual wellness visit, initial    -  Primary   Relevant Orders   EKG 12-Lead (Completed)   Need for pneumococcal vaccine       Relevant Orders   Pneumococcal conjugate vaccine 13-valent IM (Completed)   Ovarian failure       Relevant Orders   DG Bone Density   Visit for screening mammogram       Relevant Orders   MM DIGITAL SCREENING BILATERAL   Encounter for screening for HIV       Relevant Orders   HIV antibody   Encounter for hepatitis C screening test for low risk patient       Relevant Orders   Hepatitis C antibody       Follow up plan: Return in about 1 year (around 01/11/2018).

## 2017-01-12 ENCOUNTER — Encounter: Payer: Self-pay | Admitting: Unknown Physician Specialty

## 2017-01-12 LAB — COMPREHENSIVE METABOLIC PANEL
ALK PHOS: 90 IU/L (ref 39–117)
ALT: 27 IU/L (ref 0–32)
AST: 24 IU/L (ref 0–40)
Albumin/Globulin Ratio: 1.6 (ref 1.2–2.2)
Albumin: 4.3 g/dL (ref 3.6–4.8)
BUN/Creatinine Ratio: 25 (ref 12–28)
BUN: 19 mg/dL (ref 8–27)
Bilirubin Total: 0.4 mg/dL (ref 0.0–1.2)
CO2: 25 mmol/L (ref 20–29)
CREATININE: 0.77 mg/dL (ref 0.57–1.00)
Calcium: 9.8 mg/dL (ref 8.7–10.3)
Chloride: 102 mmol/L (ref 96–106)
GFR calc Af Amer: 94 mL/min/{1.73_m2} (ref >59)
GFR calc non Af Amer: 81 mL/min/{1.73_m2} (ref >59)
GLOBULIN, TOTAL: 2.7 g/dL (ref 1.5–4.5)
GLUCOSE: 97 mg/dL (ref 65–99)
Potassium: 4.6 mmol/L (ref 3.5–5.2)
SODIUM: 142 mmol/L (ref 134–144)
Total Protein: 7 g/dL (ref 6.0–8.5)

## 2017-01-12 LAB — LIPID PANEL W/O CHOL/HDL RATIO
CHOLESTEROL TOTAL: 224 mg/dL — AB (ref 100–199)
HDL: 63 mg/dL (ref >39)
LDL CALC: 132 mg/dL — AB (ref 0–99)
TRIGLYCERIDES: 145 mg/dL (ref 0–149)
VLDL Cholesterol Cal: 29 mg/dL (ref 5–40)

## 2017-01-12 LAB — HIV ANTIBODY (ROUTINE TESTING W REFLEX): HIV SCREEN 4TH GENERATION: NONREACTIVE

## 2017-01-12 LAB — HEPATITIS C ANTIBODY: HEP C VIRUS AB: 0.1 {s_co_ratio} (ref 0.0–0.9)

## 2017-01-17 DIAGNOSIS — E669 Obesity, unspecified: Secondary | ICD-10-CM | POA: Diagnosis not present

## 2017-01-17 DIAGNOSIS — R635 Abnormal weight gain: Secondary | ICD-10-CM | POA: Diagnosis not present

## 2017-01-17 DIAGNOSIS — R948 Abnormal results of function studies of other organs and systems: Secondary | ICD-10-CM | POA: Diagnosis not present

## 2017-01-17 DIAGNOSIS — Z6838 Body mass index (BMI) 38.0-38.9, adult: Secondary | ICD-10-CM | POA: Diagnosis not present

## 2017-02-03 DIAGNOSIS — R635 Abnormal weight gain: Secondary | ICD-10-CM | POA: Diagnosis not present

## 2017-02-03 DIAGNOSIS — R351 Nocturia: Secondary | ICD-10-CM | POA: Diagnosis not present

## 2017-02-03 DIAGNOSIS — G4710 Hypersomnia, unspecified: Secondary | ICD-10-CM | POA: Diagnosis not present

## 2017-02-03 DIAGNOSIS — E669 Obesity, unspecified: Secondary | ICD-10-CM | POA: Diagnosis not present

## 2017-02-03 DIAGNOSIS — M170 Bilateral primary osteoarthritis of knee: Secondary | ICD-10-CM | POA: Diagnosis not present

## 2017-02-03 DIAGNOSIS — Z6837 Body mass index (BMI) 37.0-37.9, adult: Secondary | ICD-10-CM | POA: Diagnosis not present

## 2017-02-03 DIAGNOSIS — E7800 Pure hypercholesterolemia, unspecified: Secondary | ICD-10-CM | POA: Diagnosis not present

## 2017-02-04 DIAGNOSIS — E785 Hyperlipidemia, unspecified: Secondary | ICD-10-CM | POA: Insufficient documentation

## 2017-02-04 DIAGNOSIS — E7800 Pure hypercholesterolemia, unspecified: Secondary | ICD-10-CM | POA: Insufficient documentation

## 2017-02-17 DIAGNOSIS — Z6836 Body mass index (BMI) 36.0-36.9, adult: Secondary | ICD-10-CM | POA: Diagnosis not present

## 2017-02-17 DIAGNOSIS — Z713 Dietary counseling and surveillance: Secondary | ICD-10-CM | POA: Diagnosis not present

## 2017-02-17 DIAGNOSIS — E669 Obesity, unspecified: Secondary | ICD-10-CM | POA: Diagnosis not present

## 2017-02-24 DIAGNOSIS — Z713 Dietary counseling and surveillance: Secondary | ICD-10-CM | POA: Diagnosis not present

## 2017-02-24 DIAGNOSIS — E7800 Pure hypercholesterolemia, unspecified: Secondary | ICD-10-CM | POA: Diagnosis not present

## 2017-02-24 DIAGNOSIS — Z6837 Body mass index (BMI) 37.0-37.9, adult: Secondary | ICD-10-CM | POA: Diagnosis not present

## 2017-02-24 DIAGNOSIS — R635 Abnormal weight gain: Secondary | ICD-10-CM | POA: Diagnosis not present

## 2017-02-24 DIAGNOSIS — M170 Bilateral primary osteoarthritis of knee: Secondary | ICD-10-CM | POA: Diagnosis not present

## 2017-02-24 DIAGNOSIS — E669 Obesity, unspecified: Secondary | ICD-10-CM | POA: Diagnosis not present

## 2017-03-29 ENCOUNTER — Ambulatory Visit
Admission: RE | Admit: 2017-03-29 | Discharge: 2017-03-29 | Disposition: A | Discharge status: Home / Self Care | Payer: Medicare HMO | Source: Ambulatory Visit | Attending: Unknown Physician Specialty | Admitting: Unknown Physician Specialty

## 2017-03-29 ENCOUNTER — Encounter: Payer: Self-pay | Admitting: Unknown Physician Specialty

## 2017-03-29 DIAGNOSIS — Z1231 Encounter for screening mammogram for malignant neoplasm of breast: Secondary | ICD-10-CM | POA: Insufficient documentation

## 2017-03-29 DIAGNOSIS — E2839 Other primary ovarian failure: Secondary | ICD-10-CM | POA: Diagnosis not present

## 2017-03-29 IMAGING — MG MM DIGITAL SCREENING BILAT W/ TOMO W/ CAD
8 of 12 series · 8 of 28 positions shown · non-contrast
Comparison: Previous exam(s).

CLINICAL DATA: Screening.

EXAM:
2D DIGITAL SCREENING BILATERAL MAMMOGRAM WITH CAD AND ADJUNCT TOMO

[R MLO synth-2D]
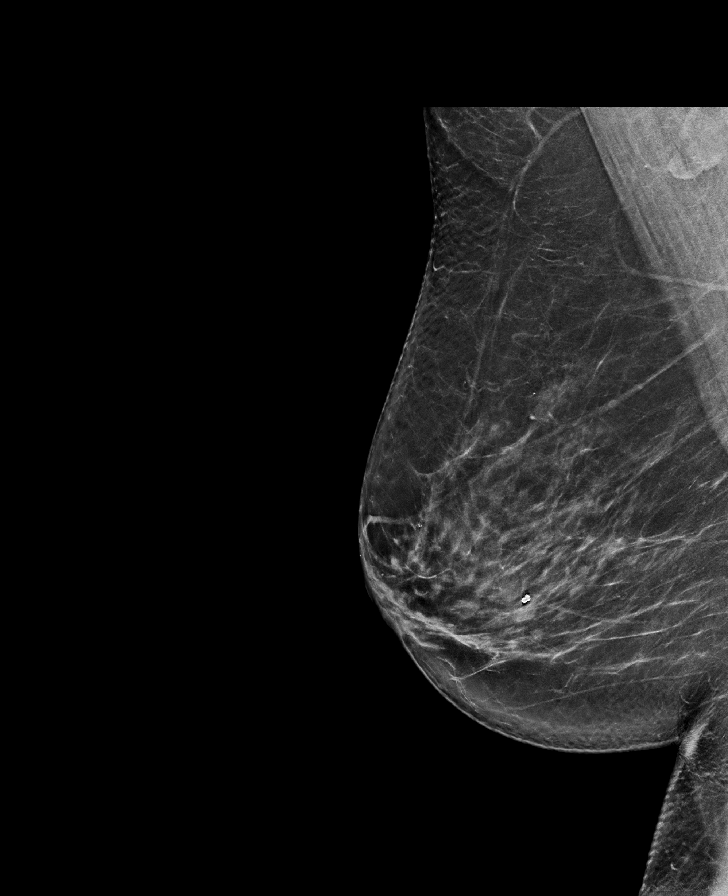

[L CC]
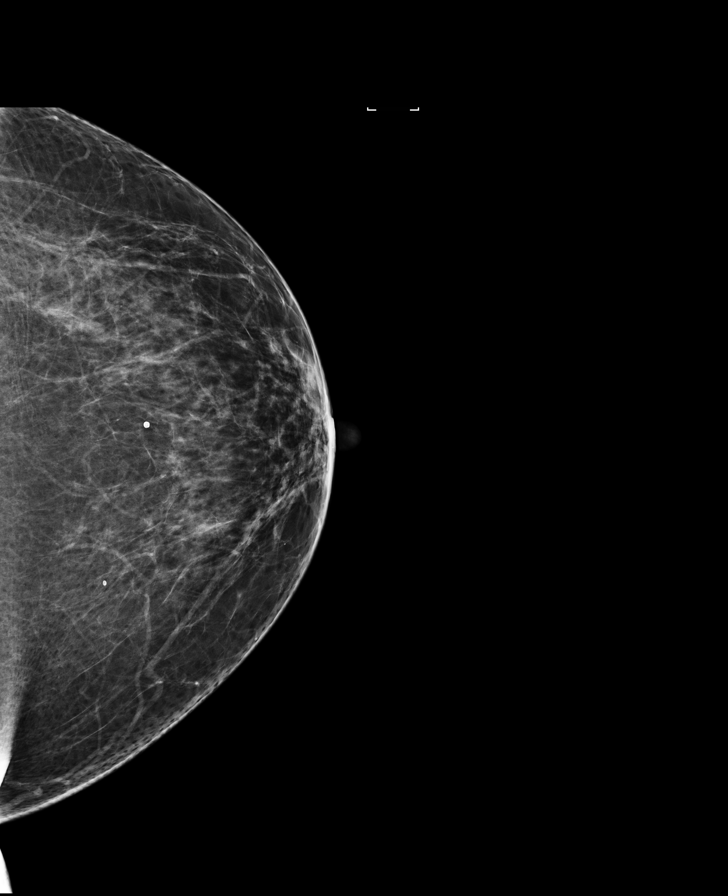

[R CC synth-2D]
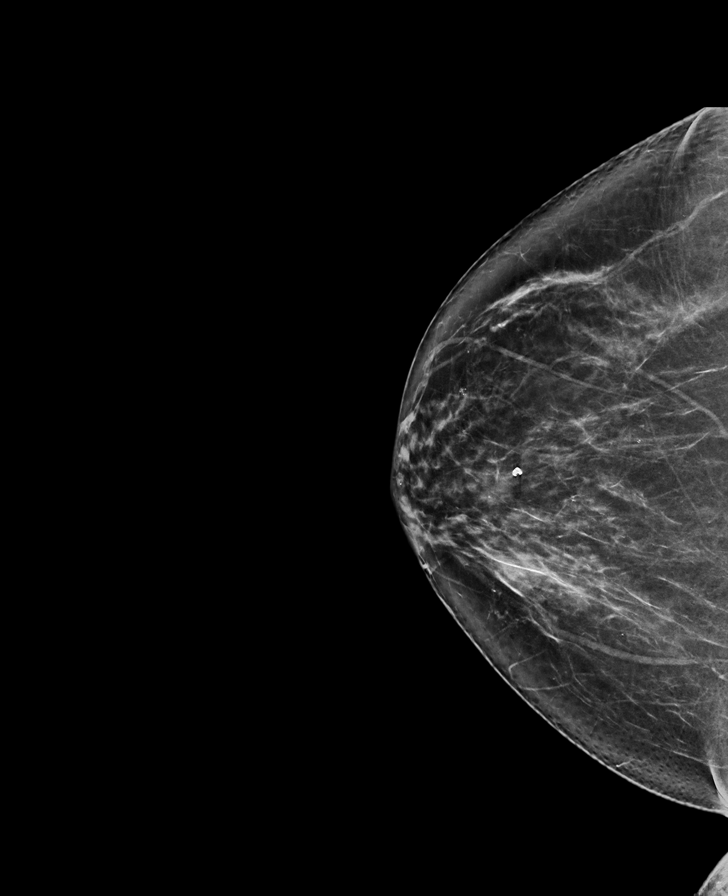

[R MLO]
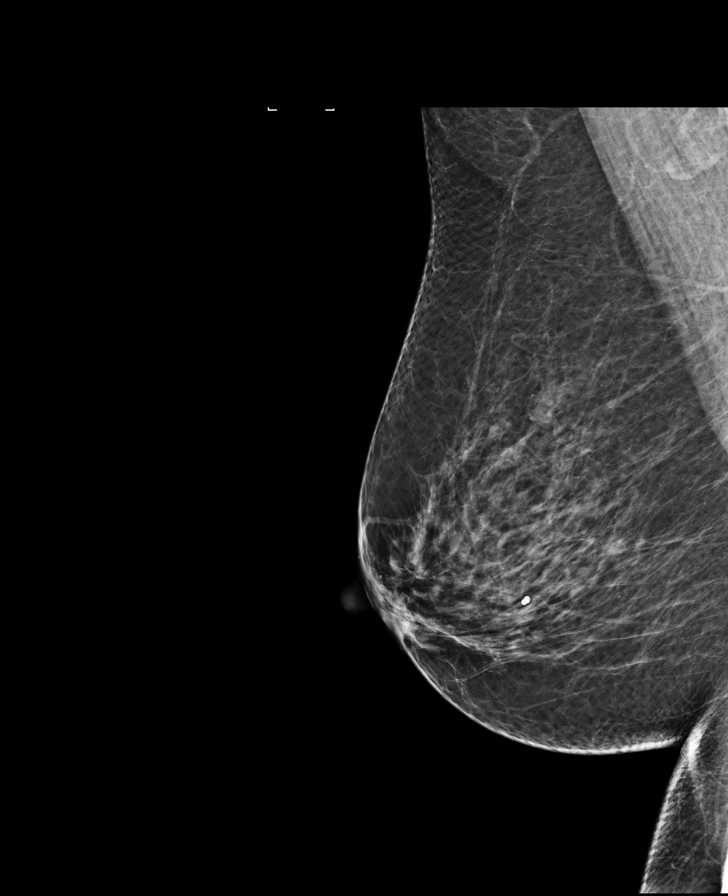

[L CC synth-2D]
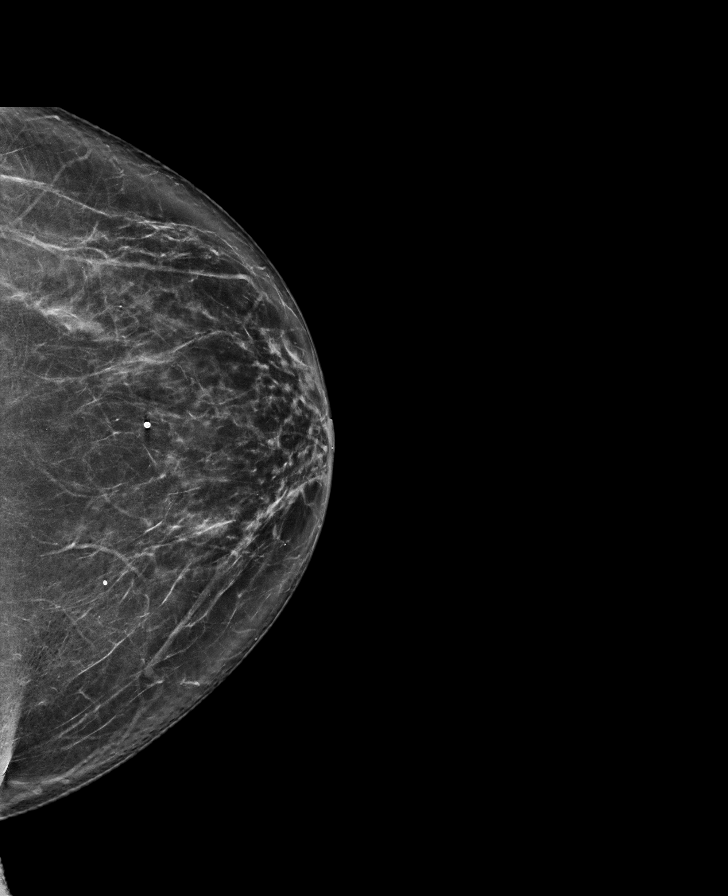

[L MLO]
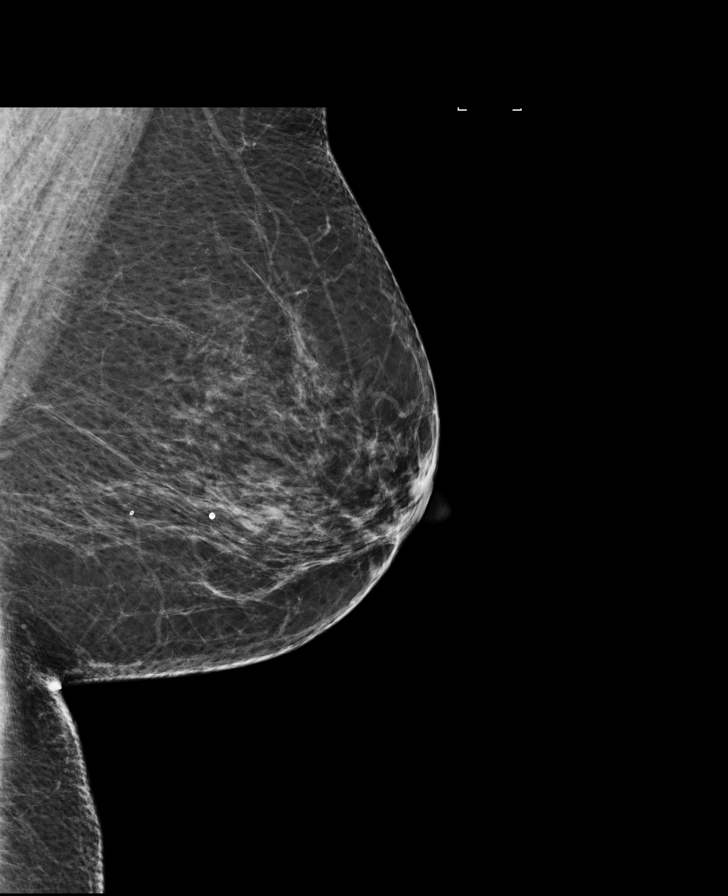

[R CC]
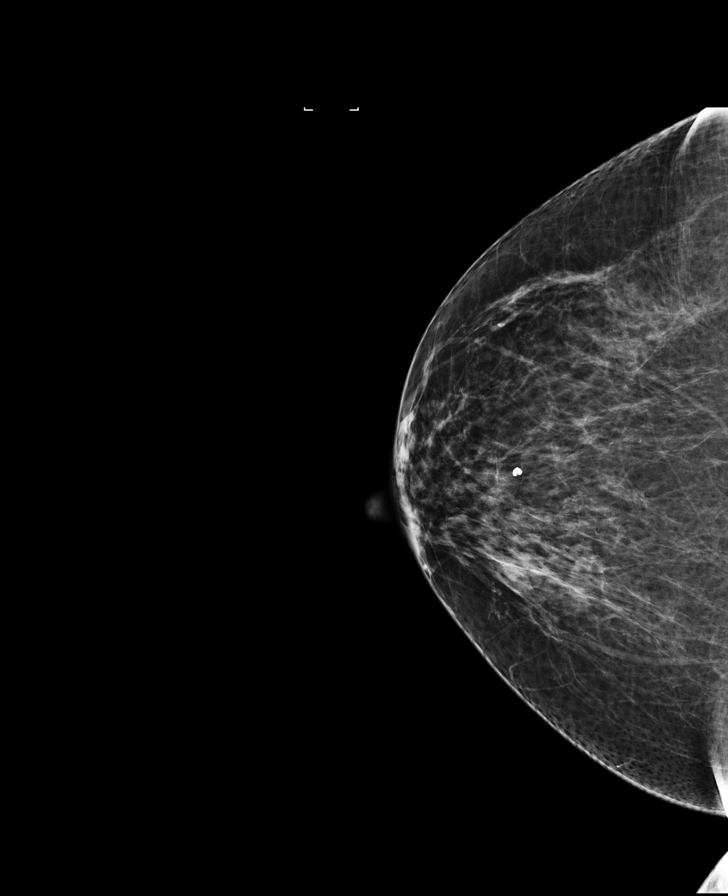

[L MLO synth-2D]
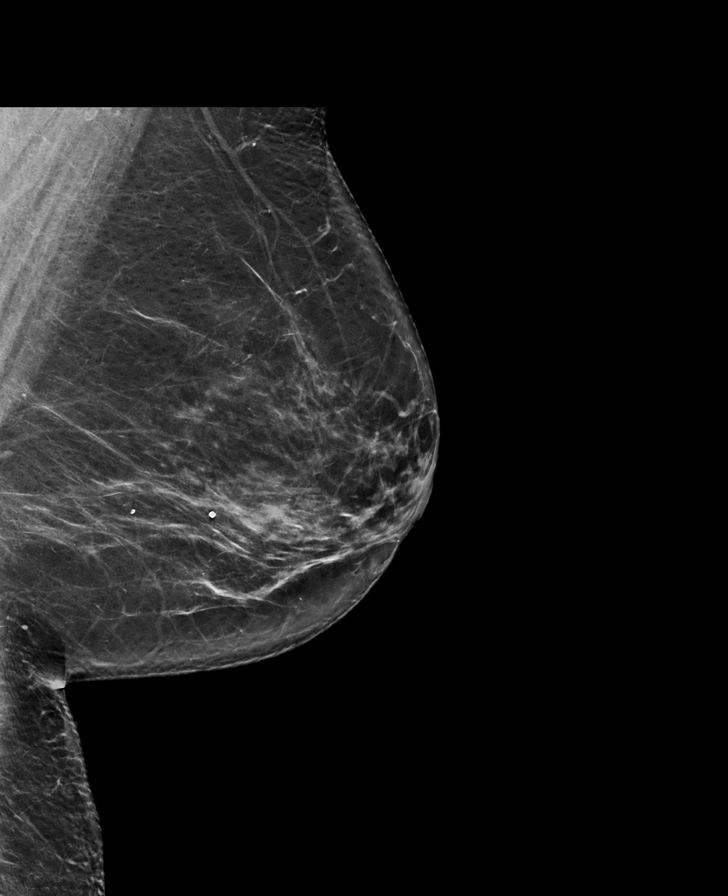

[8 of 28 positions shown; findings below may reference images not displayed]

ACR Breast Density Category b: There are scattered areas of
fibroglandular density.
FINDINGS: There are no findings suspicious for malignancy. Images were
processed with CAD.
IMPRESSION: No mammographic evidence of malignancy. A result letter of this
screening mammogram will be mailed directly to the patient.

RECOMMENDATION:
Screening mammogram in one year. (Code:[33])

BI-RADS CATEGORY  1: Negative.

## 2017-10-10 ENCOUNTER — Telehealth: Payer: Self-pay | Admitting: Unknown Physician Specialty

## 2017-10-10 ENCOUNTER — Ambulatory Visit (INDEPENDENT_AMBULATORY_CARE_PROVIDER_SITE_OTHER): Payer: Medicare HMO | Admitting: Unknown Physician Specialty

## 2017-10-10 ENCOUNTER — Encounter: Payer: Self-pay | Admitting: Unknown Physician Specialty

## 2017-10-10 VITALS — BP 130/75 | HR 69 | Temp 97.7°F | Ht 65.5 in | Wt 231.2 lb

## 2017-10-10 DIAGNOSIS — J3481 Nasal mucositis (ulcerative): Secondary | ICD-10-CM | POA: Diagnosis not present

## 2017-10-10 MED ORDER — MUPIROCIN 2 % EX OINT
1.0000 | TOPICAL_OINTMENT | Freq: Two times a day (BID) | CUTANEOUS | 0 refills | Status: DC
Start: 2017-10-10 — End: 2018-01-16

## 2017-10-10 MED ORDER — DOXYCYCLINE HYCLATE 100 MG PO TABS: 100.0000 mg | ORAL_TABLET | Freq: Two times a day (BID) | ORAL | 0 refills | Status: DC

## 2017-10-10 MED ORDER — MUPIROCIN CALCIUM 2 % NA OINT: 1.0000 "application " | TOPICAL_OINTMENT | Freq: Two times a day (BID) | NASAL | 0 refills | Status: DC

## 2017-10-10 NOTE — Telephone Encounter (Signed)
Routing to provider  

## 2017-10-10 NOTE — Telephone Encounter (Signed)
Copied from [place] [id number]. Topic: Quick Communication - See Telephone Encounter >> Oct 10, 2017 12:45 PM Rutherford Nail, NT wrote: CRM for notification. See Telephone encounter for: 10/10/17. Brandy with [facility] calling and states that the patient's insurance will not cover the mupirocin nasal ointment (BACTROBAN) 2 %, it would be $2000. Would like to switch to Mupirocin, but would need new directions. CB#: [phone]

## 2017-10-10 NOTE — Telephone Encounter (Signed)
I'm not sure what the difference is but I sent anothre script

## 2017-10-10 NOTE — Progress Notes (Signed)
BP 130/75   Pulse 69   Temp 97.7 F (36.5 C) (Oral)   Ht 5' 5.5" (1.664 m)   Wt 231 lb 3.2 oz (104.9 kg)   SpO2 96%   BMI 37.89 kg/m    Subjective:    Patient ID: Erika Anderson, female    DOB: [birth year], 67 y.o.   MRN: [id number]  HPI: Erika Anderson is a 67 y.o. female  Chief Complaint  Patient presents with  . Sore    pt states she has a sore in her right nostril, states it is like a dry spot that has been there for 3 weeks. States she had tried using neosporin and vaseline   Pt with a sore in her right nostril.  Tends to leak clear fluid at night.  Feels like a pin sticking in it at all times.  No nasal congestion or allergies.    Relevant past medical, surgical, family and social history reviewed and updated as indicated. Interim medical history since our last visit reviewed. Allergies and medications reviewed and updated.  Review of Systems  Per HPI unless specifically indicated above     Objective:    BP 130/75   Pulse 69   Temp 97.7 F (36.5 C) (Oral)   Ht 5' 5.5" (1.664 m)   Wt 231 lb 3.2 oz (104.9 kg)   SpO2 96%   BMI 37.89 kg/m   Wt Readings from Last 3 Encounters:  10/10/17 231 lb 3.2 oz (104.9 kg)  01/11/17 239 lb 11.2 oz (108.7 kg)  03/09/16 234 lb 12.8 oz (106.5 kg)    Physical Exam  Constitutional: She is oriented to person, place, and time. She appears well-developed and well-nourished. No distress.  HENT:  Head: Normocephalic and atraumatic.  Ulcer right nostril lower entrance to nares  Eyes: Conjunctivae and lids are normal. Right eye exhibits no discharge. Left eye exhibits no discharge. No scleral icterus.  Cardiovascular: Normal rate.  Pulmonary/Chest: Effort normal.  Abdominal: Normal appearance. There is no splenomegaly or hepatomegaly.  Musculoskeletal: Normal range of motion.  Neurological: She is alert and oriented to person, place, and time.  Skin: Skin is intact. No rash noted. No pallor.  Psychiatric: She has a  normal mood and affect. Her behavior is normal. Judgment and thought content normal.    Results for orders placed or performed in visit on 01/11/17  Hepatitis C antibody  Result Value Ref Range   Hep C Virus Ab 0.1 0.0 - 0.9 s/co ratio  HIV antibody  Result Value Ref Range   HIV Screen 4th Generation wRfx Non Reactive Non Reactive  Comprehensive metabolic panel  Result Value Ref Range   Glucose 97 65 - 99 mg/dL   BUN 19 8 - 27 mg/dL   Creatinine, Ser 0.77 0.57 - 1.00 mg/dL   GFR calc non Af Amer 81 >59 mL/min/1.73   GFR calc Af Amer 94 >59 mL/min/1.73   BUN/Creatinine Ratio 25 12 - 28   Sodium 142 134 - 144 mmol/L   Potassium 4.6 3.5 - 5.2 mmol/L   Chloride 102 96 - 106 mmol/L   CO2 25 20 - 29 mmol/L   Calcium 9.8 8.7 - 10.3 mg/dL   Total Protein 7.0 6.0 - 8.5 g/dL   Albumin 4.3 3.6 - 4.8 g/dL   Globulin, Total 2.7 1.5 - 4.5 g/dL   Albumin/Globulin Ratio 1.6 1.2 - 2.2   Bilirubin Total 0.4 0.0 - 1.2 mg/dL   Alkaline Phosphatase 90 39 -  117 IU/L   AST 24 0 - 40 IU/L   ALT 27 0 - 32 IU/L  Lipid Panel w/o Chol/HDL Ratio  Result Value Ref Range   Cholesterol, Total 224 (H) 100 - 199 mg/dL   Triglycerides 145 0 - 149 mg/dL   HDL 63 >39 mg/dL   VLDL Cholesterol Cal 29 5 - 40 mg/dL   LDL Calculated 132 (H) 0 - 99 mg/dL      Assessment & Plan:   Problem List Items Addressed This Visit    None    Visit Diagnoses    Ulcerative nasal mucositis    -  Primary   Single non-recurrent ulcer not improving.  R/o ingrown hair with short course of Doxycycline.  Rx for bactroban oint.  If no improvement refer to ENT       Follow up plan: Return if symptoms worsen or fail to improve.

## 2017-10-29 ENCOUNTER — Other Ambulatory Visit: Payer: Self-pay | Admitting: Unknown Physician Specialty

## 2017-11-01 ENCOUNTER — Other Ambulatory Visit: Payer: Self-pay | Admitting: Unknown Physician Specialty

## 2017-11-01 MED ORDER — ROPINIROLE HCL 1 MG PO TABS: 1.0000 mg | ORAL_TABLET | Freq: Three times a day (TID) | ORAL | 3 refills | Status: DC

## 2017-11-01 NOTE — Telephone Encounter (Signed)
Spoke to pt to sched AWV wanted to follow up on prescription request. I told her that Cheryl's nurse will be in touch as soon as she can. Thanks!

## 2017-11-01 NOTE — Telephone Encounter (Signed)
Routing to provider. Patient last seen 08/18 for physical, 05/19 for acute visit, and has AWV and CPE scheduled in 08/19.

## 2017-11-01 NOTE — Telephone Encounter (Signed)
Copied from [place] [id number]. Topic: Quick Communication - Rx Refill/Question >> Nov 01, 2017 12:38 PM Neva Seat wrote: rOPINIRole (REQUIP) 1 MG tablet  Needing refills  [facility] [place], [place] - [address] [place] [address] Phone: [phone] Fax: [phone]

## 2017-12-21 ENCOUNTER — Encounter: Payer: Self-pay | Admitting: Unknown Physician Specialty

## 2018-01-12 ENCOUNTER — Ambulatory Visit (INDEPENDENT_AMBULATORY_CARE_PROVIDER_SITE_OTHER): Payer: Medicare HMO

## 2018-01-12 VITALS — BP 118/66 | HR 70 | Temp 97.6°F | Resp 16 | Ht 67.0 in | Wt 237.6 lb

## 2018-01-12 DIAGNOSIS — E7800 Pure hypercholesterolemia, unspecified: Secondary | ICD-10-CM

## 2018-01-12 DIAGNOSIS — R5383 Other fatigue: Secondary | ICD-10-CM | POA: Diagnosis not present

## 2018-01-12 DIAGNOSIS — Z23 Encounter for immunization: Secondary | ICD-10-CM | POA: Diagnosis not present

## 2018-01-12 DIAGNOSIS — Z1231 Encounter for screening mammogram for malignant neoplasm of breast: Secondary | ICD-10-CM | POA: Diagnosis not present

## 2018-01-12 DIAGNOSIS — Z1239 Encounter for other screening for malignant neoplasm of breast: Secondary | ICD-10-CM

## 2018-01-12 DIAGNOSIS — Z0000 Encounter for general adult medical examination without abnormal findings: Secondary | ICD-10-CM

## 2018-01-12 NOTE — Patient Instructions (Addendum)
[name] , Thank you for taking time to come for your Medicare Wellness Visit. I appreciate your ongoing commitment to your health goals. Please review the following plan we discussed and let me know if I can assist you in the future.   Screening recommendations/referrals: Colonoscopy: completed 06/08/2011 Mammogram: completed 03/29/2017. Please call [phone] to schedule your mammogram.  Bone Density: completed 03/29/2017 Recommended yearly ophthalmology/optometry visit for glaucoma screening and checkup Recommended yearly dental visit for hygiene and checkup  Vaccinations: Influenza vaccine: due 02/2018 Pneumococcal vaccine: completed series Tdap vaccine: up to date  Shingles vaccine: shingrix eligible, check with your insurance company for coverage   Advanced directives: Advance directive discussed with you today. I have provided a copy for you to complete at home and have notarized. Once this is complete please bring a copy in to our office so we can scan it into your chart.  Conditions/risks identified: Recommend drinking at least 6-8 glasses of water a day   Next appointment: Follow up on 01/16/2018 at 2:00pm with Adriana Pollak,PA. Follow up in one year for your annual wellness exam.    Preventive Care 65 Years and Older, Female Preventive care refers to lifestyle choices and visits with your health care provider that can promote health and wellness. What does preventive care include?  A yearly physical exam. This is also called an annual well check.  Dental exams once or twice a year.  Routine eye exams. Ask your health care provider how often you should have your eyes checked.  Personal lifestyle choices, including:  Daily care of your teeth and gums.  Regular physical activity.  Eating a healthy diet.  Avoiding tobacco and drug use.  Limiting alcohol use.  Practicing safe sex.  Taking low-dose aspirin every day.  Taking vitamin and mineral supplements as  recommended by your health care provider. What happens during an annual well check? The services and screenings done by your health care provider during your annual well check will depend on your age, overall health, lifestyle risk factors, and family history of disease. Counseling  Your health care provider may ask you questions about your:  Alcohol use.  Tobacco use.  Drug use.  Emotional well-being.  Home and relationship well-being.  Sexual activity.  Eating habits.  History of falls.  Memory and ability to understand (cognition).  Work and work Statistician.  Reproductive health. Screening  You may have the following tests or measurements:  Height, weight, and BMI.  Blood pressure.  Lipid and cholesterol levels. These may be checked every 5 years, or more frequently if you are over 28 years old.  Skin check.  Lung cancer screening. You may have this screening every year starting at age 92 if you have a 30-pack-year history of smoking and currently smoke or have quit within the past 15 years.  Fecal occult blood test (FOBT) of the stool. You may have this test every year starting at age 54.  Flexible sigmoidoscopy or colonoscopy. You may have a sigmoidoscopy every 5 years or a colonoscopy every 10 years starting at age 55.  Hepatitis C blood test.  Hepatitis B blood test.  Sexually transmitted disease (STD) testing.  Diabetes screening. This is done by checking your blood sugar (glucose) after you have not eaten for a while (fasting). You may have this done every 1-3 years.  Bone density scan. This is done to screen for osteoporosis. You may have this done starting at age 98.  Mammogram. This may be done every  1-2 years. Talk to your health care provider about how often you should have regular mammograms. Talk with your health care provider about your test results, treatment options, and if necessary, the need for more tests. Vaccines  Your health care  provider may recommend certain vaccines, such as:  Influenza vaccine. This is recommended every year.  Tetanus, diphtheria, and acellular pertussis (Tdap, Td) vaccine. You may need a Td booster every 10 years.  Zoster vaccine. You may need this after age 78.  Pneumococcal 13-valent conjugate (PCV13) vaccine. One dose is recommended after age 69.  Pneumococcal polysaccharide (PPSV23) vaccine. One dose is recommended after age 31. Talk to your health care provider about which screenings and vaccines you need and how often you need them. This information is not intended to replace advice given to you by your health care provider. Make sure you discuss any questions you have with your health care provider. Document Released: 06/20/2015 Document Revised: 02/11/2016 Document Reviewed: 03/25/2015 Elsevier Interactive Patient Education  2017 [place] Prevention in the Home Falls can cause injuries. They can happen to people of all ages. There are many things you can do to make your home safe and to help prevent falls. What can I do on the outside of my home?  Regularly fix the edges of walkways and driveways and fix any cracks.  Remove anything that might make you trip as you walk through a door, such as a raised step or threshold.  Trim any bushes or trees on the path to your home.  Use bright outdoor lighting.  Clear any walking paths of anything that might make someone trip, such as rocks or tools.  Regularly check to see if handrails are loose or broken. Make sure that both sides of any steps have handrails.  Any raised decks and porches should have guardrails on the edges.  Have any leaves, snow, or ice cleared regularly.  Use sand or salt on walking paths during winter.  Clean up any spills in your garage right away. This includes oil or grease spills. What can I do in the bathroom?  Use night lights.  Install grab bars by the toilet and in the tub and shower. Do  not use towel bars as grab bars.  Use non-skid mats or decals in the tub or shower.  If you need to sit down in the shower, use a plastic, non-slip stool.  Keep the floor dry. Clean up any water that spills on the floor as soon as it happens.  Remove soap buildup in the tub or shower regularly.  Attach bath mats securely with double-sided non-slip rug tape.  Do not have throw rugs and other things on the floor that can make you trip. What can I do in the bedroom?  Use night lights.  Make sure that you have a light by your bed that is easy to reach.  Do not use any sheets or blankets that are too big for your bed. They should not hang down onto the floor.  Have a firm chair that has side arms. You can use this for support while you get dressed.  Do not have throw rugs and other things on the floor that can make you trip. What can I do in the kitchen?  Clean up any spills right away.  Avoid walking on wet floors.  Keep items that you use a lot in easy-to-reach places.  If you need to reach something above you, use a strong  step stool that has a grab bar.  Keep electrical cords out of the way.  Do not use floor polish or wax that makes floors slippery. If you must use wax, use non-skid floor wax.  Do not have throw rugs and other things on the floor that can make you trip. What can I do with my stairs?  Do not leave any items on the stairs.  Make sure that there are handrails on both sides of the stairs and use them. Fix handrails that are broken or loose. Make sure that handrails are as long as the stairways.  Check any carpeting to make sure that it is firmly attached to the stairs. Fix any carpet that is loose or worn.  Avoid having throw rugs at the top or bottom of the stairs. If you do have throw rugs, attach them to the floor with carpet tape.  Make sure that you have a light switch at the top of the stairs and the bottom of the stairs. If you do not have them,  ask someone to add them for you. What else can I do to help prevent falls?  Wear shoes that:  Do not have high heels.  Have rubber bottoms.  Are comfortable and fit you well.  Are closed at the toe. Do not wear sandals.  If you use a stepladder:  Make sure that it is fully opened. Do not climb a closed stepladder.  Make sure that both sides of the stepladder are locked into place.  Ask someone to hold it for you, if possible.  Clearly mark and make sure that you can see:  Any grab bars or handrails.  First and last steps.  Where the edge of each step is.  Use tools that help you move around (mobility aids) if they are needed. These include:  Canes.  Walkers.  Scooters.  Crutches.  Turn on the lights when you go into a dark area. Replace any light bulbs as soon as they burn out.  Set up your furniture so you have a clear path. Avoid moving your furniture around.  If any of your floors are uneven, fix them.  If there are any pets around you, be aware of where they are.  Review your medicines with your doctor. Some medicines can make you feel dizzy. This can increase your chance of falling. Ask your doctor what other things that you can do to help prevent falls. This information is not intended to replace advice given to you by your health care provider. Make sure you discuss any questions you have with your health care provider. Document Released: 03/20/2009 Document Revised: 10/30/2015 Document Reviewed: 06/28/2014 Elsevier Interactive Patient Education  2017 [place].  Pneumococcal Polysaccharide Vaccine: What You Need to Know 1. Why get vaccinated? Vaccination can protect older adults (and some children and younger adults) from pneumococcal disease. Pneumococcal disease is caused by bacteria that can spread from person to person through close contact. It can cause ear infections, and it can also lead to more serious infections of the:  Lungs  (pneumonia),  Blood (bacteremia), and  Covering of the brain and spinal cord (meningitis). Meningitis can cause deafness and brain damage, and it can be fatal.  Anyone can get pneumococcal disease, but children under 38 years of age, people with certain medical conditions, adults over 43 years of age, and cigarette smokers are at the highest risk. About 18,000 older adults die each year from pneumococcal disease in the [place].  Treatment of pneumococcal infections with penicillin and other drugs used to be more effective. But some strains of the disease have become resistant to these drugs. This makes prevention of the disease, through vaccination, even more important. 2. Pneumococcal polysaccharide vaccine (PPSV23) Pneumococcal polysaccharide vaccine (PPSV23) protects against 23 types of pneumococcal bacteria. It will not prevent all pneumococcal disease. PPSV23 is recommended for:  All adults 21 years of age and older,  Anyone 2 through 65 years of age with certain long-term health problems,  Anyone 2 through 66 years of age with a weakened immune system,  Adults 23 through 66 years of age who smoke cigarettes or have asthma.  Most people need only one dose of PPSV. A second dose is recommended for certain high-risk groups. People 10 and older should get a dose even if they have gotten one or more doses of the vaccine before they turned 65. Your healthcare provider can give you more information about these recommendations. Most healthy adults develop protection within 2 to 3 weeks of getting the shot. 3. Some people should not get this vaccine  Anyone who has had a life-threatening allergic reaction to PPSV should not get another dose.  Anyone who has a severe allergy to any component of PPSV should not receive it. Tell your provider if you have any severe allergies.  Anyone who is moderately or severely ill when the shot is scheduled may be asked to wait until they recover  before getting the vaccine. Someone with a mild illness can usually be vaccinated.  Children less than 6 years of age should not receive this vaccine.  There is no evidence that PPSV is harmful to either a pregnant woman or to her fetus. However, as a precaution, women who need the vaccine should be vaccinated before becoming pregnant, if possible. 4. Risks of a vaccine reaction With any medicine, including vaccines, there is a chance of side effects. These are usually mild and go away on their own, but serious reactions are also possible. About half of people who get PPSV have mild side effects, such as redness or pain where the shot is given, which go away within about two days. Less than 1 out of 100 people develop a fever, muscle aches, or more severe local reactions. Problems that could happen after any vaccine:  People sometimes faint after a medical procedure, including vaccination. Sitting or lying down for about 15 minutes can help prevent fainting, and injuries caused by a fall. Tell your doctor if you feel dizzy, or have vision changes or ringing in the ears.  Some people get severe pain in the shoulder and have difficulty moving the arm where a shot was given. This happens very rarely.  Any medication can cause a severe allergic reaction. Such reactions from a vaccine are very rare, estimated at about 1 in a million doses, and would happen within a few minutes to a few hours after the vaccination. As with any medicine, there is a very remote chance of a vaccine causing a serious injury or death. The safety of vaccines is always being monitored. For more information, visit: http://www.aguilar.org/ 5. What if there is a serious reaction? What should I look for? Look for anything that concerns you, such as signs of a severe allergic reaction, very high fever, or unusual behavior. Signs of a severe allergic reaction can include hives, swelling of the face and throat, difficulty  breathing, a fast heartbeat, dizziness, and weakness. These would usually start a few  minutes to a few hours after the vaccination. What should I do? If you think it is a severe allergic reaction or other emergency that can't wait, call 9-1-1 or get to the nearest hospital. Otherwise, call your doctor. Afterward, the reaction should be reported to the Vaccine Adverse Event Reporting System (VAERS). Your doctor might file this report, or you can do it yourself through the VAERS web site at www.vaers.SamedayNews.es, or by calling 712-068-6624. VAERS does not give medical advice. 6. How can I learn more?  Ask your doctor. He or she can give you the vaccine package insert or suggest other sources of information.  Call your local or state health department.  Contact the Centers for Disease Control and Prevention (CDC): ? Call 971-364-9194 (1-800-CDC-INFO) or ? Visit CDC's website at http://hunter.com/ CDC Pneumococcal Polysaccharide Vaccine VIS (09/28/13) This information is not intended to replace advice given to you by your health care provider. Make sure you discuss any questions you have with your health care provider. Document Released: 03/21/2006 Document Revised: 02/12/2016 Document Reviewed: 02/12/2016 Elsevier Interactive Patient Education  2017 [employer].

## 2018-01-12 NOTE — Progress Notes (Signed)
Subjective:   Erika Anderson is a 67 y.o. female who presents for an Initial Medicare Annual Wellness Visit.  Review of Systems     Cardiac Risk Factors include: advanced age (>96men, >49 women);dyslipidemia;obesity (BMI >30kg/m2)     Objective:    Today's Vitals   01/12/18 0847  BP: 118/66  Pulse: 70  Resp: 16  Temp: 97.6 F (36.4 C)  TempSrc: Temporal  SpO2: 98%  Weight: 237 lb 9.6 oz (107.8 kg)  Height: 5\' 7"  (1.702 m)   Body mass index is 37.21 kg/m.  Advanced Directives 01/12/2018 01/11/2017  Does Patient Have a Medical Advance Directive? No No  Would patient like information on creating a medical advance directive? Yes (MAU/Ambulatory/Procedural Areas - Information given) Yes (ED - Information included in AVS)    Current Medications (verified) Outpatient Encounter Medications as of 01/12/2018  Medication Sig  . aspirin EC 81 MG tablet Take 81 mg by mouth daily.  Marland Kitchen rOPINIRole (REQUIP) 1 MG tablet Take 1 tablet (1 mg total) by mouth 3 (three) times daily.  Marland Kitchen doxycycline (VIBRA-TABS) 100 MG tablet Take 1 tablet (100 mg total) by mouth 2 (two) times daily. (Patient not taking: Reported on 01/12/2018)  . mupirocin nasal ointment (BACTROBAN) 2 % Place 1 application into the nose 2 (two) times daily. Use one-half of tube in each nostril twice daily for five (5) days. After application, press sides of nose together and gently massage. (Patient not taking: Reported on 01/12/2018)  . mupirocin ointment (BACTROBAN) 2 % Place 1 application into the nose 2 (two) times daily. (Patient not taking: Reported on 01/12/2018)   No facility-administered encounter medications on file as of 01/12/2018.     Allergies (verified) Patient has no known allergies.   History: Past Medical History:  Diagnosis Date  . Hyperlipidemia   . Obesity (BMI 30-39.9)   . Restless legs syndrome (RLS) 12/28/2014  . RLS (restless legs syndrome)    Past Surgical History:  Procedure Laterality Date  .  ABDOMINAL HYSTERECTOMY     ovaries remain  . HEMORROIDECTOMY     Family History  Problem Relation Age of Onset  . Cancer Mother        ovarian  . Arthritis Sister   . Heart disease Brother        MI  . Stroke Paternal Grandmother   . Cancer Paternal Grandfather        lung   Social History   Socioeconomic History  . Marital status: Married    Spouse name: Not on file  . Number of children: Not on file  . Years of education: 48  . Highest education level: High school graduate  Occupational History  . Not on file  Social Needs  . Financial resource strain: Not hard at all  . Food insecurity:    Worry: Never true    Inability: Never true  . Transportation needs:    Medical: No    Non-medical: No  Tobacco Use  . Smoking status: Never Smoker  . Smokeless tobacco: Never Used  Substance and Sexual Activity  . Alcohol use: No    Alcohol/week: 0.0 standard drinks  . Drug use: No  . Sexual activity: Never  Lifestyle  . Physical activity:    Days per week: 0 days    Minutes per session: 0 min  . Stress: Not at all  Relationships  . Social connections:    Talks on phone: More than three times a week  Gets together: More than three times a week    Attends religious service: More than 4 times per year    Active member of club or organization: No    Attends meetings of clubs or organizations: Never    Relationship status: Married  Other Topics Concern  . Not on file  Social History Narrative  . Not on file    Tobacco Counseling Counseling given: Not Answered   Clinical Intake:  Pre-visit preparation completed: Yes  Pain : No/denies pain     Nutritional Status: BMI > 30  Obese Nutritional Risks: None Diabetes: No  How often do you need to have someone help you when you read instructions, pamphlets, or other written materials from your doctor or pharmacy?: 1 - Never What is the last grade level you completed in school?: 12th grade  Interpreter Needed?:  No  Information entered by :: Tiffany Hill,LPN    Activities of Daily Living In your present state of health, do you have any difficulty performing the following activities: 01/12/2018  Hearing? N  Vision? N  Difficulty concentrating or making decisions? N  Walking or climbing stairs? Y  Comment knees bother her  Dressing or bathing? N  Doing errands, shopping? N  Preparing Food and eating ? N  Using the Toilet? N  In the past six months, have you accidently leaked urine? N  Do you have problems with loss of bowel control? N  Managing your Medications? N  Managing your Finances? N  Housekeeping or managing your Housekeeping? N  Some recent data might be hidden     Immunizations and Health Maintenance Immunization History  Administered Date(s) Administered  . Influenza,inj,Quad PF,6+ Mos 03/09/2016  . Pneumococcal Conjugate-13 01/11/2017  . Pneumococcal Polysaccharide-23 01/12/2018  . Tdap 12/30/2015   Health Maintenance Due  Topic Date Due  . INFLUENZA VACCINE  01/05/2018    Patient Care Team: Kathrine Haddock, NP as PCP - General (Nurse Practitioner) Rico Junker, RN as Registered Nurse Theodore Demark, RN as Registered Nurse  Indicate any recent Medical Services you may have received from other than [facility] providers in the past year (date may be approximate).     Assessment:   This is a routine wellness examination for Tris.  Hearing/Vision screen Vision Screening Comments: Goes to [facility] in [place] as needed  Dietary issues and exercise activities discussed: Current Exercise Habits: The patient does not participate in regular exercise at present, Exercise limited by: None identified  Goals    . DIET - INCREASE WATER INTAKE     Recommend drinking at least 6-8 glasses of water a day       Depression Screen PHQ 2/9 Scores 01/12/2018 01/11/2017 12/30/2015  PHQ - 2 Score 0 0 0  PHQ- 9 Score - 2 -    Fall Risk Fall Risk  01/12/2018 01/11/2017 12/30/2015    Falls in the past year? No No No    Is the patient's home free of loose throw rugs in walkways, pet beds, electrical cords, etc?   yes      Grab bars in the bathroom? no      Handrails on the stairs?   yes      Adequate lighting?   yes  Timed Get Up and Go Performed Completed in 8 seconds with no use of assistive devices, steady gait. No intervention needed at this time.   Cognitive Function:     6CIT Screen 01/12/2018  What Year? 0 points  What  month? 0 points  What time? 0 points  Count back from 20 0 points  Months in reverse 0 points  Repeat phrase 2 points  Total Score 2    Screening Tests Health Maintenance  Topic Date Due  . INFLUENZA VACCINE  01/05/2018  . MAMMOGRAM  03/29/2018  . COLONOSCOPY  06/07/2021  . TETANUS/TDAP  12/29/2025  . DEXA SCAN  Completed  . Hepatitis C Screening  Completed  . PNA vac Low Risk Adult  Completed    Qualifies for Shingles Vaccine? Yes, discussed shingrix vaccine   Cancer Screenings: Lung: Low Dose CT Chest recommended if Age 14-80 years, 30 pack-year currently smoking OR have quit w/in 15years. Patient does not qualify. Breast: Up to date on Mammogram? Yes   03/29/2017 Up to date of Bone Density/Dexa? Yes 03/29/2017 Colorectal: completed 06/08/2011  Additional Screenings:  Hepatitis C Screening: completed 01/11/2017     Plan:    I have personally reviewed and addressed the Medicare Annual Wellness questionnaire and have noted the following in the patient's chart:  A. Medical and social history B. Use of alcohol, tobacco or illicit drugs  C. Current medications and supplements D. Functional ability and status E.  Nutritional status F.  Physical activity G. Advance directives H. List of other physicians  I.  Hospitalizations, surgeries, and ER visits in previous 12 months J.  [place] such as hearing and vision if needed, cognitive and depression L. Referrals and appointments   In addition, I have reviewed and  discussed with patient certain preventive protocols, quality metrics, and best practice recommendations. A written personalized care plan for preventive services as well as general preventive health recommendations were provided to patient.   Signed,  Tyler Aas, LPN Nurse Health Advisor   Nurse Notes:   -requesting requip to be increased to 4 time a day. She is currently taking it 4 times a day.   -Would like to discuss if she needs to go back on estradiol again. Had hysterectomy at [facility] about 20 years ago. She is unsure if it was a partial or total hysterectomy. She states she stopped taking it because of her concern for breast cancer.   She has CPE on 01/16/2018 with Adriana Pollak,PA.

## 2018-01-13 ENCOUNTER — Telehealth: Payer: Self-pay | Admitting: Unknown Physician Specialty

## 2018-01-13 LAB — COMPREHENSIVE METABOLIC PANEL
ALT: 24 IU/L (ref 0–32)
AST: 22 IU/L (ref 0–40)
Albumin/Globulin Ratio: 1.8 (ref 1.2–2.2)
Albumin: 4.2 g/dL (ref 3.6–4.8)
Alkaline Phosphatase: 86 IU/L (ref 39–117)
BUN/Creatinine Ratio: 22 (ref 12–28)
BUN: 18 mg/dL (ref 8–27)
Bilirubin Total: 0.4 mg/dL (ref 0.0–1.2)
CO2: 24 mmol/L (ref 20–29)
Calcium: 9.7 mg/dL (ref 8.7–10.3)
Chloride: 101 mmol/L (ref 96–106)
Creatinine, Ser: 0.83 mg/dL (ref 0.57–1.00)
GFR calc Af Amer: 85 mL/min/{1.73_m2} (ref >59)
GFR calc non Af Amer: 74 mL/min/{1.73_m2} (ref >59)
Globulin, Total: 2.4 g/dL (ref 1.5–4.5)
Glucose: 78 mg/dL (ref 65–99)
Potassium: 4.2 mmol/L (ref 3.5–5.2)
Sodium: 141 mmol/L (ref 134–144)
Total Protein: 6.6 g/dL (ref 6.0–8.5)

## 2018-01-13 LAB — LIPID PANEL W/O CHOL/HDL RATIO
Cholesterol, Total: 219 mg/dL — ABNORMAL HIGH (ref 100–199)
HDL: 54 mg/dL (ref >39)
LDL Calculated: 132 mg/dL — ABNORMAL HIGH (ref 0–99)
Triglycerides: 167 mg/dL — ABNORMAL HIGH (ref 0–149)
VLDL Cholesterol Cal: 33 mg/dL (ref 5–40)

## 2018-01-13 LAB — CBC WITH DIFFERENTIAL/PLATELET
Basophils Absolute: 0 10*3/uL (ref 0.0–0.2)
Basos: 0 %
EOS (ABSOLUTE): 0.1 10*3/uL (ref 0.0–0.4)
Eos: 1 %
Hematocrit: 41.7 % (ref 34.0–46.6)
Hemoglobin: 14 g/dL (ref 11.1–15.9)
Immature Grans (Abs): 0 10*3/uL (ref 0.0–0.1)
Immature Granulocytes: 0 %
Lymphocytes Absolute: 2.8 10*3/uL (ref 0.7–3.1)
Lymphs: 35 %
MCH: 30.7 pg (ref 26.6–33.0)
MCHC: 33.6 g/dL (ref 31.5–35.7)
MCV: 91 fL (ref 79–97)
Monocytes Absolute: 0.8 10*3/uL (ref 0.1–0.9)
Monocytes: 10 %
Neutrophils Absolute: 4.3 10*3/uL (ref 1.4–7.0)
Neutrophils: 54 %
Platelets: 266 10*3/uL (ref 150–450)
RBC: 4.56 x10E6/uL (ref 3.77–5.28)
RDW: 14 % (ref 12.3–15.4)
WBC: 8 10*3/uL (ref 3.4–10.8)

## 2018-01-13 LAB — TSH: TSH: 2.77 u[IU]/mL (ref 0.450–4.500)

## 2018-01-13 NOTE — Telephone Encounter (Signed)
Patient states she is returning a call to [place].     Please give her a call back. Thank you

## 2018-01-16 ENCOUNTER — Encounter: Payer: Medicare HMO | Admitting: Unknown Physician Specialty

## 2018-01-16 ENCOUNTER — Encounter: Payer: Self-pay | Admitting: Physician Assistant

## 2018-01-16 ENCOUNTER — Ambulatory Visit (INDEPENDENT_AMBULATORY_CARE_PROVIDER_SITE_OTHER): Payer: Medicare HMO | Admitting: Physician Assistant

## 2018-01-16 VITALS — BP 142/76 | HR 76 | Temp 98.4°F | Ht 65.5 in | Wt 238.4 lb

## 2018-01-16 DIAGNOSIS — Z0000 Encounter for general adult medical examination without abnormal findings: Secondary | ICD-10-CM | POA: Diagnosis not present

## 2018-01-16 DIAGNOSIS — G2581 Restless legs syndrome: Secondary | ICD-10-CM | POA: Diagnosis not present

## 2018-01-16 MED ORDER — ROPINIROLE HCL 1 MG PO TABS: 1.0000 mg | ORAL_TABLET | Freq: Four times a day (QID) | ORAL | 0 refills | Status: DC

## 2018-01-16 NOTE — Progress Notes (Signed)
Subjective:    Patient ID: Erika Anderson, female    DOB: May 23, 1951, 67 y.o.   MRN: [id number]  Erika Anderson is a 67 y.o. female presenting on 01/16/2018 for Annual Exam (pt had wellness exam with [facility] 01/13/18)   HPI   Presents here today for CPE. Reports she had a normal colonoscopy 5 years ago but does not remember who did this. Mammogram is due and has been ordered.  She is on Requip 1 mg TID for restless leg syndrome. She feels this wears off and has had to take 1 mg QID intermittently. She would like to increase to this dose.   She has been on hormone replacement in the past. She has gained weight recently and wondering if hormone replacement therapy would help with this. She denies vasomotor symptoms to include night sweats and hot flashes. She hasn't been on HRT in > 3 years. She experienced menopause 15 years ago. She was previously successful with weight loss on a 1200 calorie a day diet.   Wt Readings from Last 3 Encounters:  01/16/18 238 lb 6.4 oz (108.1 kg)  01/12/18 237 lb 9.6 oz (107.8 kg)  10/10/17 231 lb 3.2 oz (104.9 kg)     Social History   Tobacco Use  . Smoking status: Never Smoker  . Smokeless tobacco: Never Used  Substance Use Topics  . Alcohol use: No    Alcohol/week: 0.0 standard drinks  . Drug use: No    Review of Systems Per HPI unless specifically indicated above     Objective:    BP (!) 142/76   Pulse 76   Temp 98.4 F (36.9 C) (Oral)   Ht 5' 5.5" (1.664 m)   Wt 238 lb 6.4 oz (108.1 kg)   SpO2 98%   BMI 39.07 kg/m   Wt Readings from Last 3 Encounters:  01/16/18 238 lb 6.4 oz (108.1 kg)  01/12/18 237 lb 9.6 oz (107.8 kg)  10/10/17 231 lb 3.2 oz (104.9 kg)    Physical Exam  Constitutional: She is oriented to person, place, and time. She appears well-developed and well-nourished.  HENT:  Right Ear: External ear normal.  Left Ear: External ear normal.  Cardiovascular: Normal rate and regular rhythm.  Pulmonary/Chest: Effort  normal and breath sounds normal.  Neurological: She is alert and oriented to person, place, and time.  Skin: Skin is warm and dry.  Psychiatric: She has a normal mood and affect. Her behavior is normal.   Results for orders placed or performed in visit on 01/12/18  Comp Met (CMET)  Result Value Ref Range   Glucose 78 65 - 99 mg/dL   BUN 18 8 - 27 mg/dL   Creatinine, Ser 0.83 0.57 - 1.00 mg/dL   GFR calc non Af Amer 74 >59 mL/min/1.73   GFR calc Af Amer 85 >59 mL/min/1.73   BUN/Creatinine Ratio 22 12 - 28   Sodium 141 134 - 144 mmol/L   Potassium 4.2 3.5 - 5.2 mmol/L   Chloride 101 96 - 106 mmol/L   CO2 24 20 - 29 mmol/L   Calcium 9.7 8.7 - 10.3 mg/dL   Total Protein 6.6 6.0 - 8.5 g/dL   Albumin 4.2 3.6 - 4.8 g/dL   Globulin, Total 2.4 1.5 - 4.5 g/dL   Albumin/Globulin Ratio 1.8 1.2 - 2.2   Bilirubin Total 0.4 0.0 - 1.2 mg/dL   Alkaline Phosphatase 86 39 - 117 IU/L   AST 22 0 - 40 IU/L  ALT 24 0 - 32 IU/L  Lipid Panel w/o Chol/HDL Ratio  Result Value Ref Range   Cholesterol, Total 219 (H) 100 - 199 mg/dL   Triglycerides 167 (H) 0 - 149 mg/dL   HDL 54 >39 mg/dL   VLDL Cholesterol Cal 33 5 - 40 mg/dL   LDL Calculated 132 (H) 0 - 99 mg/dL  CBC with Differential  Result Value Ref Range   WBC 8.0 3.4 - 10.8 x10E3/uL   RBC 4.56 3.77 - 5.28 x10E6/uL   Hemoglobin 14.0 11.1 - 15.9 g/dL   Hematocrit 41.7 34.0 - 46.6 %   MCV 91 79 - 97 fL   MCH 30.7 26.6 - 33.0 pg   MCHC 33.6 31.5 - 35.7 g/dL   RDW 14.0 12.3 - 15.4 %   Platelets 266 150 - 450 x10E3/uL   Neutrophils 54 Not Estab. %   Lymphs 35 Not Estab. %   Monocytes 10 Not Estab. %   Eos 1 Not Estab. %   Basos 0 Not Estab. %   Neutrophils Absolute 4.3 1.4 - 7.0 x10E3/uL   Lymphocytes Absolute 2.8 0.7 - 3.1 x10E3/uL   Monocytes Absolute 0.8 0.1 - 0.9 x10E3/uL   EOS (ABSOLUTE) 0.1 0.0 - 0.4 x10E3/uL   Basophils Absolute 0.0 0.0 - 0.2 x10E3/uL   Immature Granulocytes 0 Not Estab. %   Immature Grans (Abs) 0.0 0.0 - 0.1  x10E3/uL  TSH  Result Value Ref Range   TSH 2.770 0.450 - 4.500 uIU/mL      Assessment & Plan:  1. Annual physical exam  Talked about HRT. I do not think it would help her significantly with weight loss and in the absence of vasomotor symptoms along with the time elapsed since menopause I think the risks would outweigh the benefits in her case .  2. Restless legs syndrome (RLS)  Increase to requip QID.   - rOPINIRole (REQUIP) 1 MG tablet; Take 1 tablet (1 mg total) by mouth 4 (four) times daily.  Dispense: 360 tablet; Refill: 0    Follow up plan: Return in about 1 year (around 01/17/2019) for CPE .  Carles Collet, PA-C  [facility] 01/18/2018, 12:05 PM

## 2018-01-18 NOTE — Patient Instructions (Signed)

## 2018-02-08 ENCOUNTER — Telehealth: Payer: Self-pay

## 2018-02-08 NOTE — Telephone Encounter (Signed)
Patient requested a call when high dose flu vaccines were in, called and informed patient the vaccines were at the office and she could come anytime during business hours to receive it. Pt verbalized understanding

## 2018-04-10 ENCOUNTER — Other Ambulatory Visit: Payer: Self-pay | Admitting: Unknown Physician Specialty

## 2018-04-24 ENCOUNTER — Ambulatory Visit: Payer: Self-pay | Admitting: *Deleted

## 2018-04-24 DIAGNOSIS — J22 Unspecified acute lower respiratory infection: Secondary | ICD-10-CM | POA: Diagnosis not present

## 2018-04-24 DIAGNOSIS — J029 Acute pharyngitis, unspecified: Secondary | ICD-10-CM | POA: Diagnosis not present

## 2018-04-24 DIAGNOSIS — R05 Cough: Secondary | ICD-10-CM | POA: Diagnosis not present

## 2018-04-24 DIAGNOSIS — R6889 Other general symptoms and signs: Secondary | ICD-10-CM | POA: Diagnosis not present

## 2018-04-24 DIAGNOSIS — R6884 Jaw pain: Secondary | ICD-10-CM | POA: Diagnosis not present

## 2018-04-24 NOTE — Telephone Encounter (Signed)
  Reason for Disposition . Fever present > 3 days (72 hours)  Answer Assessment - Initial Assessment Questions 1. TEMPERATURE: "What is the most recent temperature?"  "How was it measured?"      You have felt like fever 2. ONSET: "When did the fever start?"      thursday 14th 3. SYMPTOMS: "Do you have any other symptoms besides the fever?"  (e.g., colds, headache, sore throat, earache, cough, rash, diarrhea, vomiting, abdominal pain)   Cough, aches,headache 4. CAUSE: If there are no symptoms, ask: "What do you think is causing the fever?"      sick 5. CONTACTS: "Does anyone else in the family have an infection?"     no 6. TREATMENT: "What have you done so far to treat this fever?" (e.g., medications)     ibuprfen niquil 7. IMMUNOCOMPROMISE: "Do you have of the following: diabetes, HIV positive, splenectomy, cancer chemotherapy, chronic steroid treatment, transplant patient, etc."     no 8. PREGNANCY: "Is there any chance you are pregnant?" "When was your last menstrual period?"     no 9. TRAVEL: "Have you traveled out of the country in the last month?" (e.g., travel history, exposures)     N/A  Protocols used: FEVER-A-AH

## 2018-04-24 NOTE — Telephone Encounter (Signed)
Pt call to ask if she had had her flu vaccine this past office visit in August. Pt was advised that her record does not indicate that she has had the flu vaccine this year. She states that she is sick now and has been running fevers that cause her to sweat. She has not checked temp with thermometer. Pt states she has a cough as well.  She has been treating since last Thursday with Ibuprofen and NyQuil. Per protocol pt needs to be seen. Pt [facility] because appointment not available till the 21st. Pt agrees to plan. Care advice read to patient. Pt verbalized understanding of all instructions.

## 2018-04-24 NOTE — Telephone Encounter (Signed)
Pt called asking if she received her flu vaccine at her last office visit.  Called patient back, no answer, left message for he to give the office a call back.

## 2018-04-25 ENCOUNTER — Other Ambulatory Visit: Payer: Self-pay | Admitting: Physician Assistant

## 2018-04-25 DIAGNOSIS — G2581 Restless legs syndrome: Secondary | ICD-10-CM

## 2018-04-25 DIAGNOSIS — R079 Chest pain, unspecified: Secondary | ICD-10-CM | POA: Diagnosis not present

## 2018-04-25 DIAGNOSIS — E7800 Pure hypercholesterolemia, unspecified: Secondary | ICD-10-CM | POA: Diagnosis not present

## 2018-04-25 DIAGNOSIS — R0602 Shortness of breath: Secondary | ICD-10-CM | POA: Diagnosis not present

## 2018-04-25 NOTE — Telephone Encounter (Signed)
Thank you.  Appears she did not go to urgent care.

## 2018-04-26 NOTE — Telephone Encounter (Signed)
Refill request approved for Ropinirole

## 2018-04-26 NOTE — Telephone Encounter (Signed)
Pt stated she went to [facility] urgent care connected to [facility].

## 2018-04-26 NOTE — Telephone Encounter (Signed)
Thank you.  No note in chart yet, but will keep eyes open.

## 2018-05-03 ENCOUNTER — Encounter: Payer: Self-pay | Admitting: Emergency Medicine

## 2018-05-03 ENCOUNTER — Emergency Department: Payer: Medicare HMO

## 2018-05-03 ENCOUNTER — Emergency Department
Admission: EM | Admit: 2018-05-03 | Discharge: 2018-05-03 | Disposition: A | Discharge status: Home / Self Care | Payer: Medicare HMO | Attending: Student in an Organized Health Care Education/Training Program | Admitting: Student in an Organized Health Care Education/Training Program

## 2018-05-03 DIAGNOSIS — R0602 Shortness of breath: Secondary | ICD-10-CM

## 2018-05-03 DIAGNOSIS — J40 Bronchitis, not specified as acute or chronic: Secondary | ICD-10-CM | POA: Insufficient documentation

## 2018-05-03 DIAGNOSIS — Z7982 Long term (current) use of aspirin: Secondary | ICD-10-CM | POA: Insufficient documentation

## 2018-05-03 DIAGNOSIS — J189 Pneumonia, unspecified organism: Secondary | ICD-10-CM | POA: Diagnosis not present

## 2018-05-03 DIAGNOSIS — Z79899 Other long term (current) drug therapy: Secondary | ICD-10-CM | POA: Diagnosis not present

## 2018-05-03 DIAGNOSIS — R079 Chest pain, unspecified: Secondary | ICD-10-CM | POA: Diagnosis not present

## 2018-05-03 DIAGNOSIS — R0600 Dyspnea, unspecified: Secondary | ICD-10-CM | POA: Diagnosis not present

## 2018-05-03 DIAGNOSIS — R062 Wheezing: Secondary | ICD-10-CM | POA: Diagnosis not present

## 2018-05-03 DIAGNOSIS — R0902 Hypoxemia: Secondary | ICD-10-CM | POA: Diagnosis not present

## 2018-05-03 LAB — COMPREHENSIVE METABOLIC PANEL
ALBUMIN: 4.3 g/dL (ref 3.5–5.0)
ALK PHOS: 85 U/L (ref 38–126)
ALT: 96 U/L — AB (ref 0–44)
AST: 74 U/L — ABNORMAL HIGH (ref 15–41)
Anion gap: 9 (ref 5–15)
BUN: 18 mg/dL (ref 8–23)
CALCIUM: 9.5 mg/dL (ref 8.9–10.3)
CHLORIDE: 102 mmol/L (ref 98–111)
CO2: 28 mmol/L (ref 22–32)
CREATININE: 0.72 mg/dL (ref 0.44–1.00)
GFR calc non Af Amer: >60 mL/min (ref >60)
GLUCOSE: 105 mg/dL — AB (ref 70–99)
Potassium: 4.1 mmol/L (ref 3.5–5.1)
SODIUM: 139 mmol/L (ref 135–145)
Total Bilirubin: 0.4 mg/dL (ref 0.3–1.2)
Total Protein: 7.3 g/dL (ref 6.5–8.1)

## 2018-05-03 LAB — CBC WITH DIFFERENTIAL/PLATELET
ABS IMMATURE GRANULOCYTES: 0.02 10*3/uL (ref 0.00–0.07)
BASOS PCT: 1 %
Basophils Absolute: 0.1 10*3/uL (ref 0.0–0.1)
Eosinophils Absolute: 0.1 10*3/uL (ref 0.0–0.5)
Eosinophils Relative: 1 %
HCT: 40.1 % (ref 36.0–46.0)
HEMOGLOBIN: 13.4 g/dL (ref 12.0–15.0)
Immature Granulocytes: 0 %
LYMPHS PCT: 32 %
Lymphs Abs: 2.8 10*3/uL (ref 0.7–4.0)
MCH: 30.1 pg (ref 26.0–34.0)
MCHC: 33.4 g/dL (ref 30.0–36.0)
MCV: 90.1 fL (ref 80.0–100.0)
MONO ABS: 0.6 10*3/uL (ref 0.1–1.0)
Monocytes Relative: 7 %
NRBC: 0 % (ref 0.0–0.2)
Neutro Abs: 5.2 10*3/uL (ref 1.7–7.7)
Neutrophils Relative %: 59 %
PLATELETS: 263 10*3/uL (ref 150–400)
RBC: 4.45 MIL/uL (ref 3.87–5.11)
RDW: 13 % (ref 11.5–15.5)
WBC: 8.8 10*3/uL (ref 4.0–10.5)

## 2018-05-03 IMAGING — CR DG CHEST 2V
2 series · 2 of 2 positions shown · non-contrast
Comparison: [DATE]

CLINICAL DATA: Chest pain.  Recent pneumonia

EXAM:
CHEST - 2 VIEW

[chest pa]
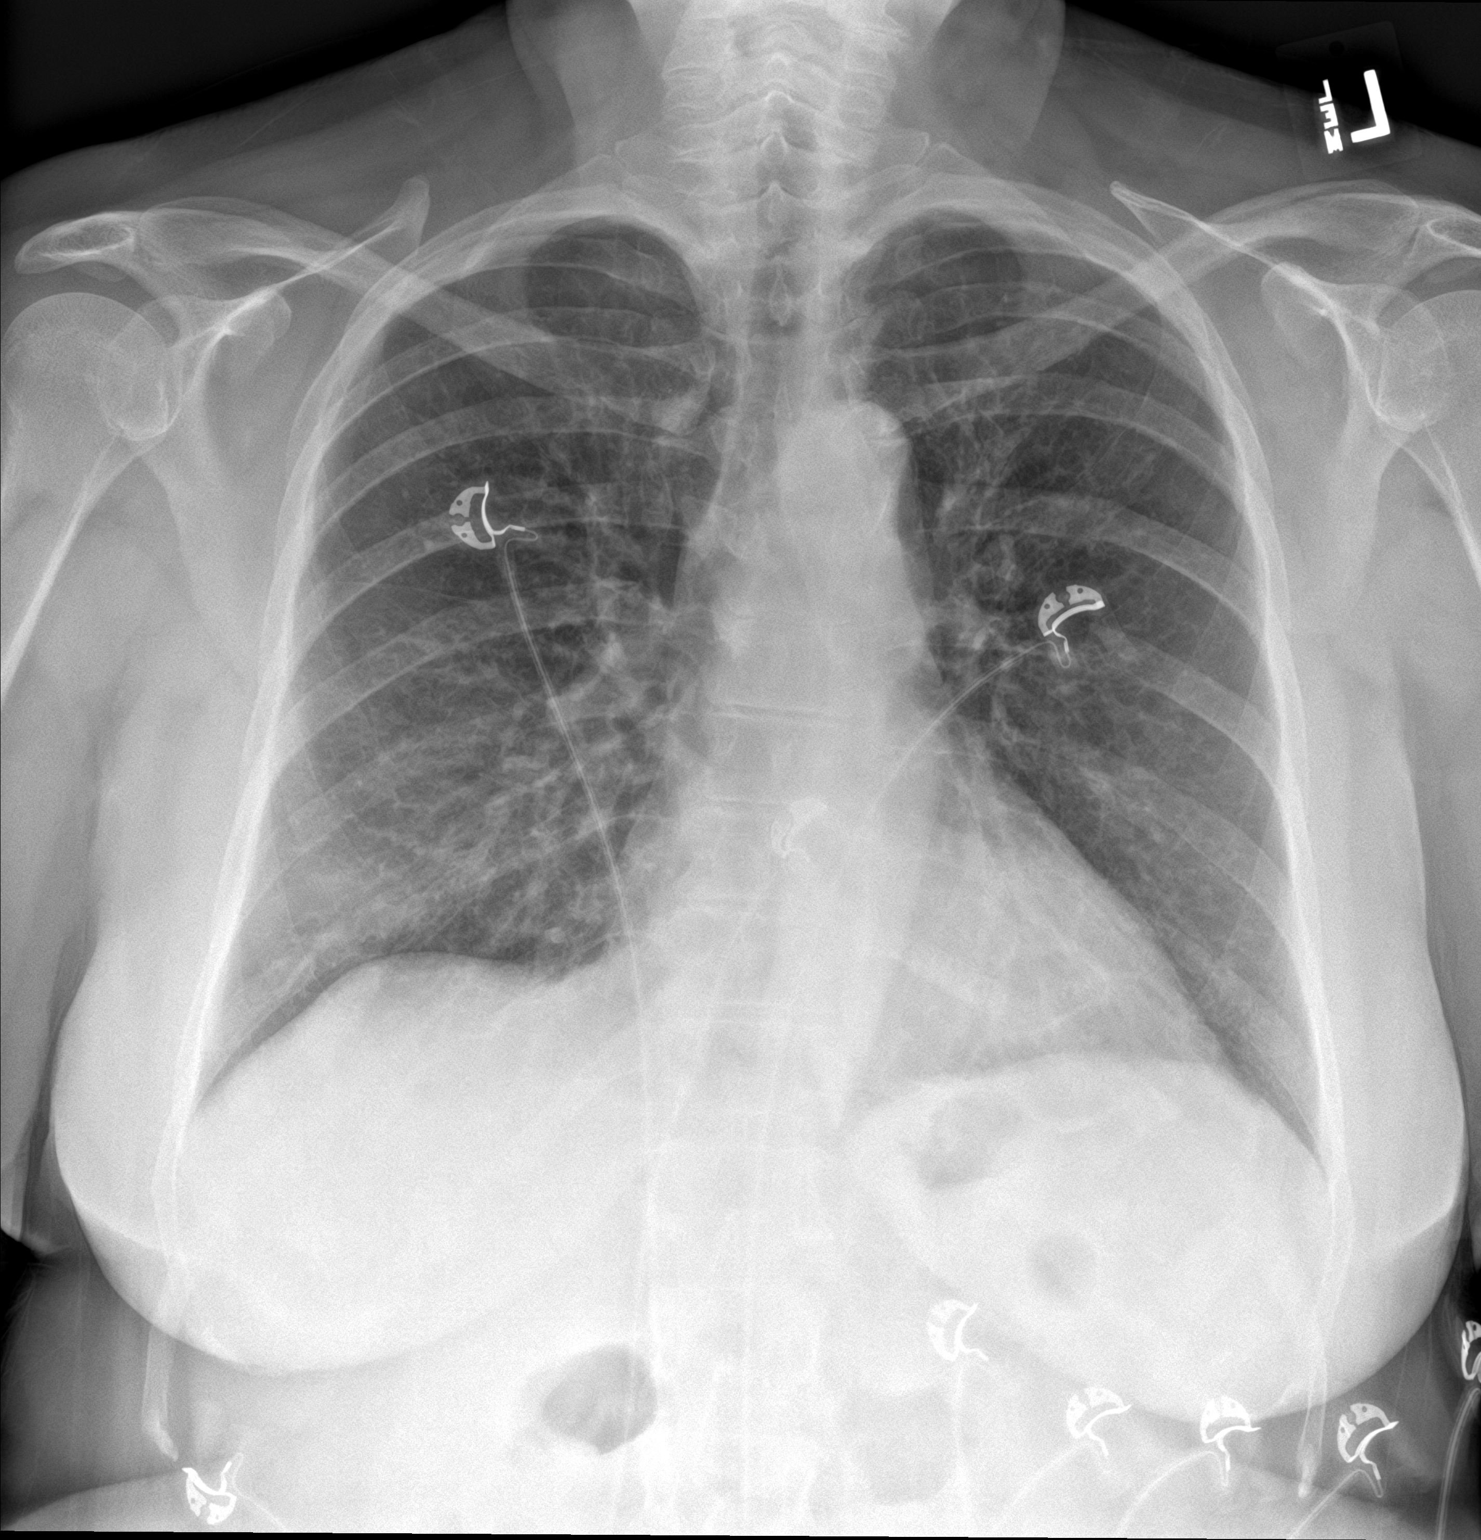

[chest lat]
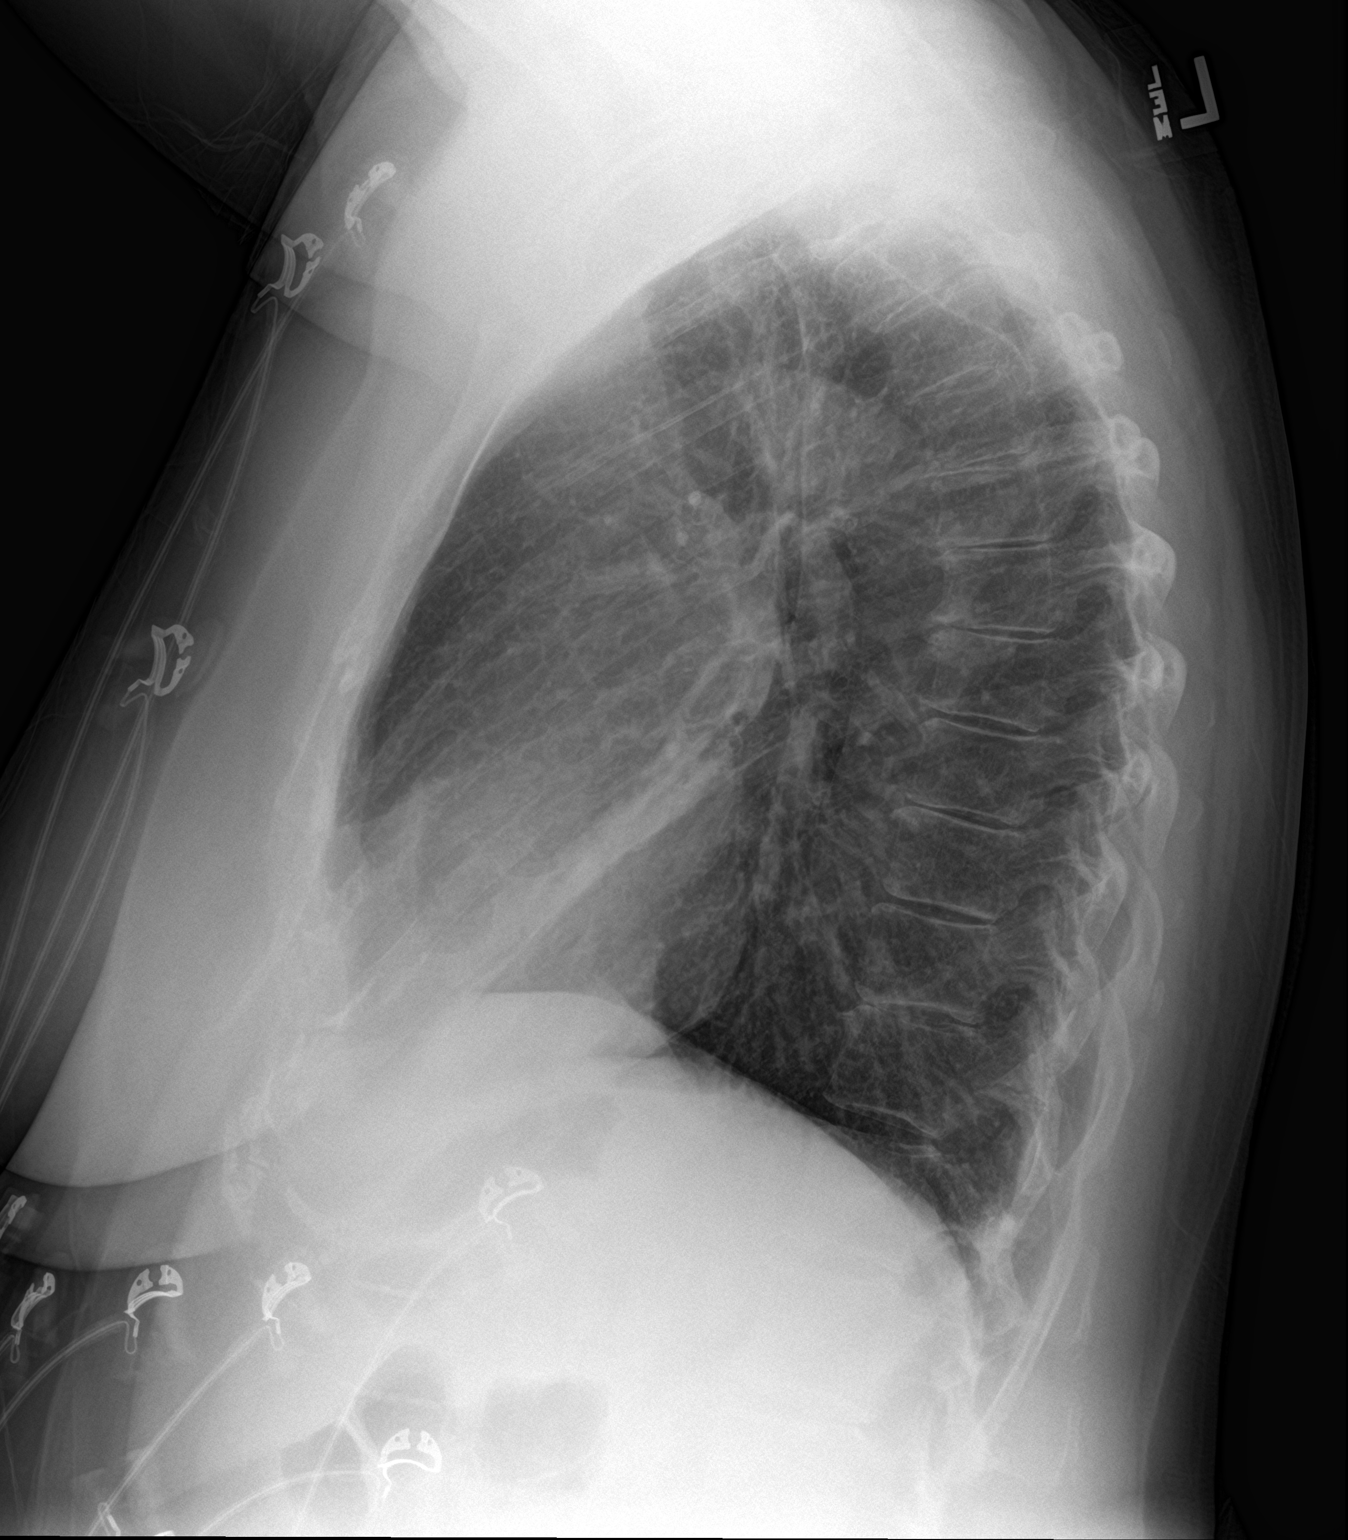

[2 of 2 positions shown; findings below may reference images not displayed]

FINDINGS: There is airspace consolidation consistent with pneumonia in the
lingula, better appreciated on lateral view. There is slight
atelectasis in the right mid lung, stable. No new opacity is
evident. Heart size and pulmonary vascularity are normal. No
adenopathy. There is aortic atherosclerosis. There is mild
degenerative change in the thoracic spine.
IMPRESSION: Persistent airspace consolidation in the lingula with volume loss,
better seen on the lateral view. Mild right midlung atelectasis. No
new opacity. Stable cardiac silhouette. There is aortic
atherosclerosis.

Aortic Atherosclerosis ([ST]-[ST]).

Followup PA and lateral chest radiographs recommended in 3-4 weeks
following trial of antibiotic therapy to ensure resolution and
exclude underlying malignancy.

## 2018-05-03 IMAGING — CT CT ANGIO CHEST
2 of 6 series · 19 of 46 positions shown · IV contrast (APPLIED)
Comparison: None.

CLINICAL DATA: Shortness of breath, productive cough.

EXAM:
CT ANGIOGRAPHY CHEST WITH CONTRAST
TECHNIQUE: Multidetector CT imaging of the chest was performed using the
standard protocol during bolus administration of intravenous
contrast. Multiplanar CT image reconstructions and MIPs were
obtained to evaluate the vascular anatomy.
CONTRAST:  75mL OMNIPAQUE IOHEXOL 350 MG/ML SOLN

[Series 5: thins · axial · 0.64mm/px · z∈[+154,+412]mm · 16 of 284 slices shown]
[im 13/284  lung]
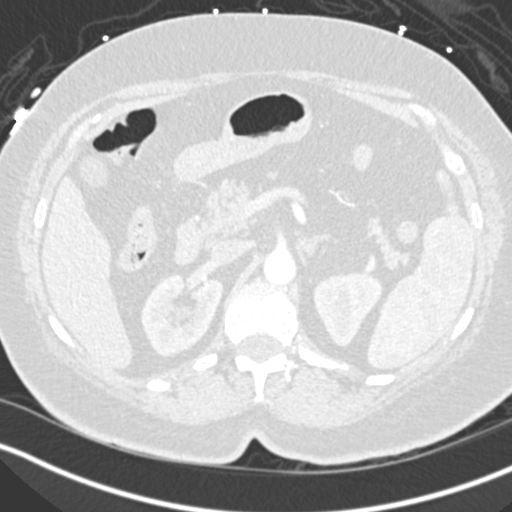
[im 37/284  soft-tissue]
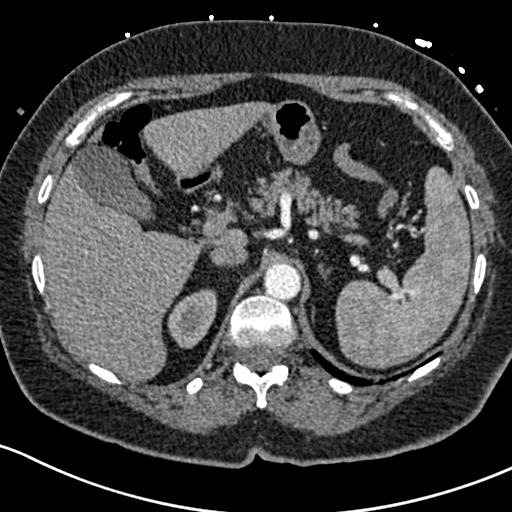
[im 50/284  lung]
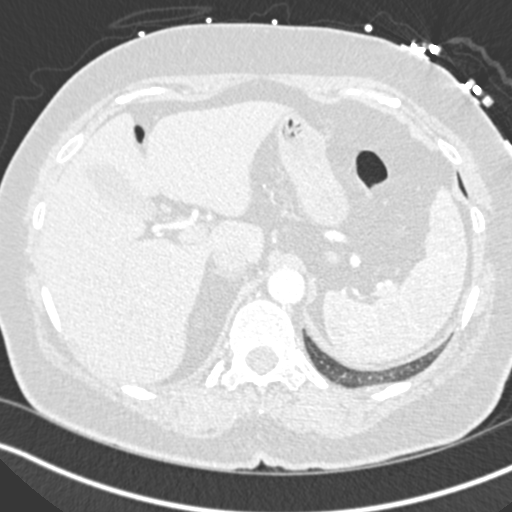
[im 62/284  soft-tissue]
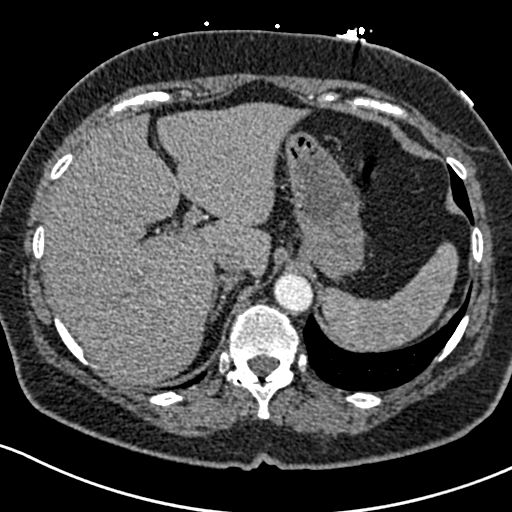
[im 87/284  lung]
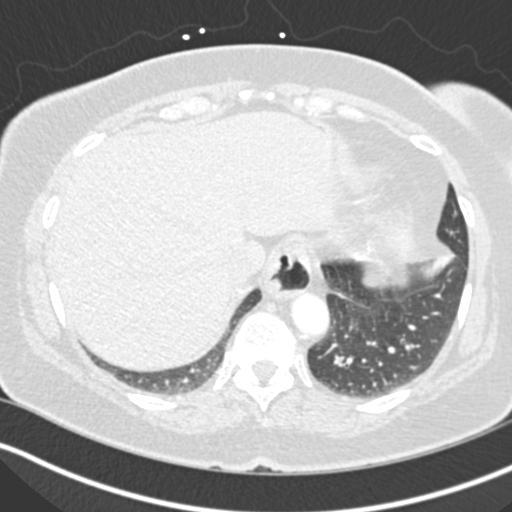
[im 99/284  soft-tissue]
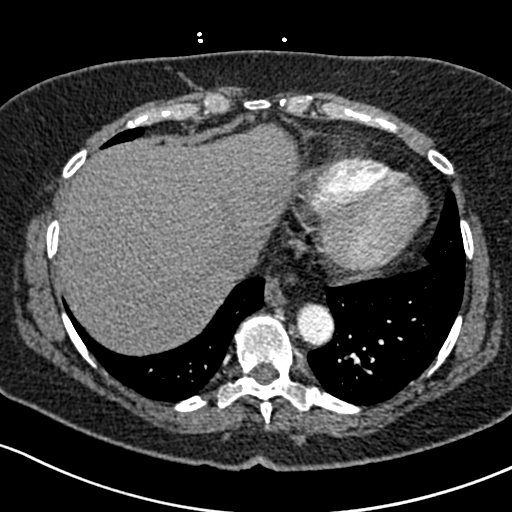
[im 111/284  lung]
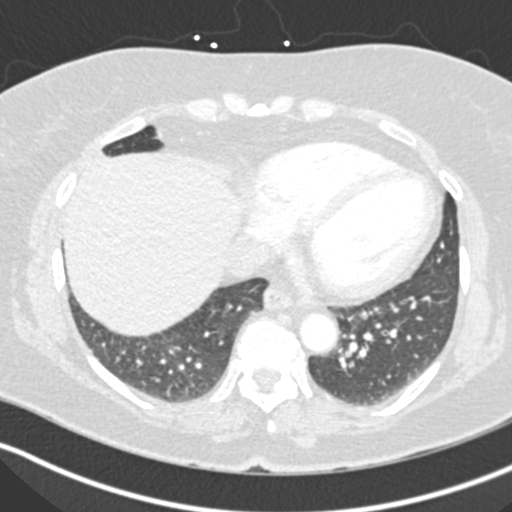
[im 136/284  soft-tissue]
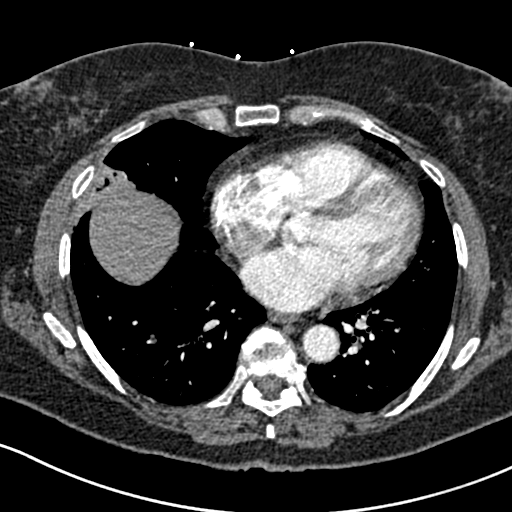
[im 148/284  lung]
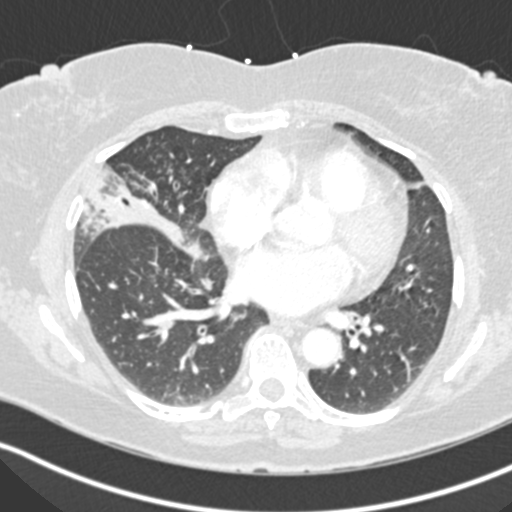
[im 173/284  soft-tissue]
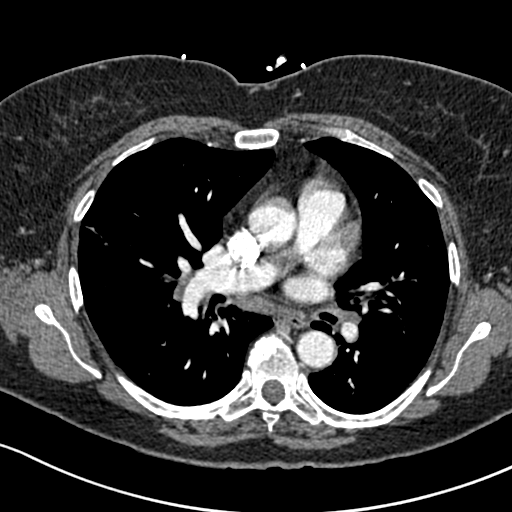
[im 185/284  lung]
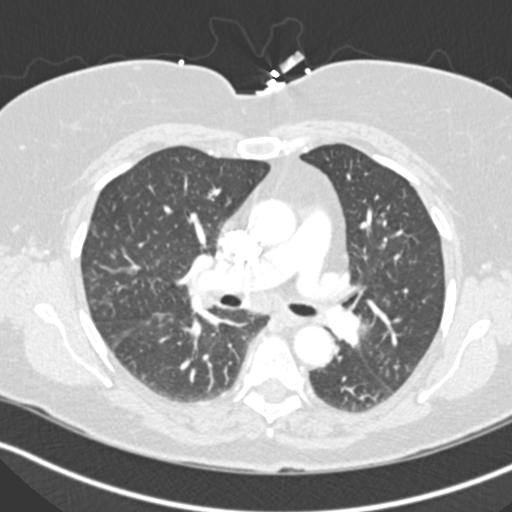
[im 197/284  soft-tissue]
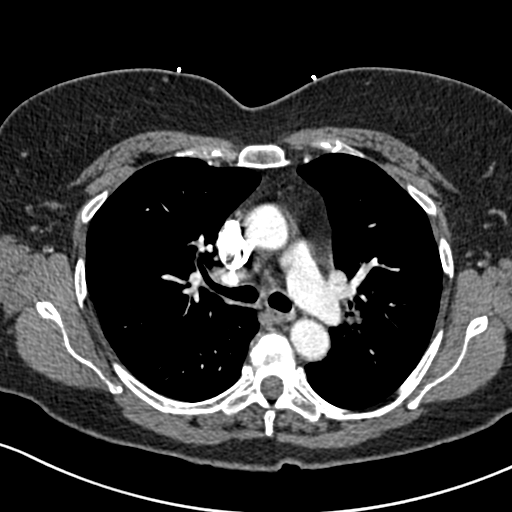
[im 222/284  lung]
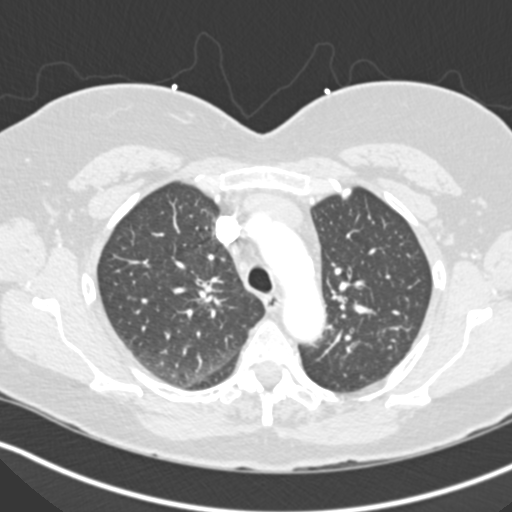
[im 234/284  soft-tissue]
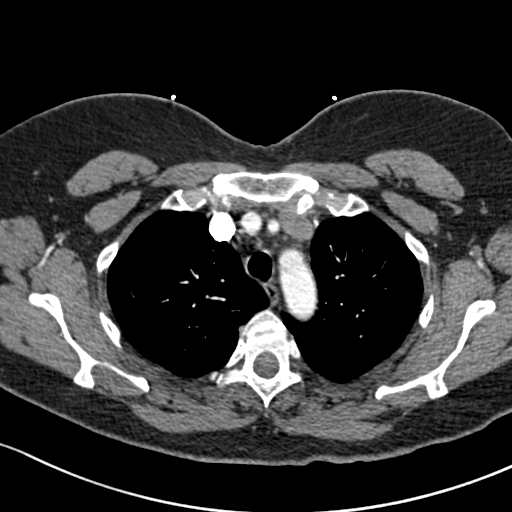
[im 247/284  lung]
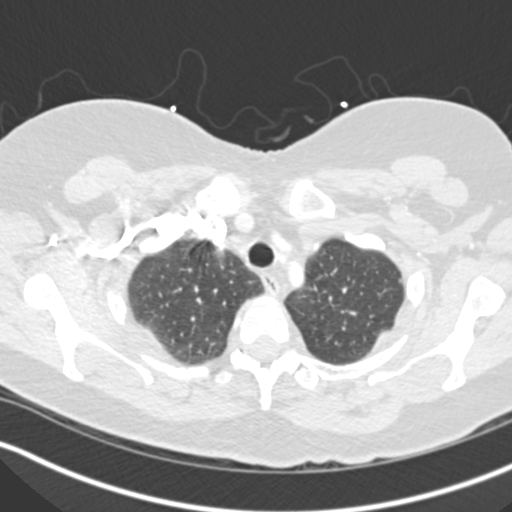
[im 271/284  soft-tissue]
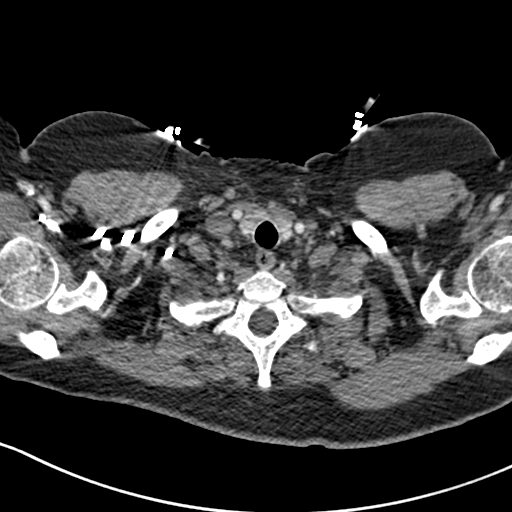

[Series 7: coronal mpr · coronal · 0.55mm/px · 3 of 91 slices shown]
[im 23/91  soft-tissue]
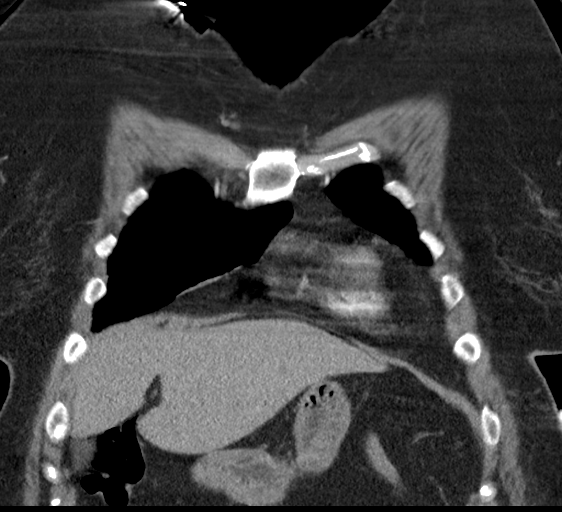
[im 46/91  soft-tissue]
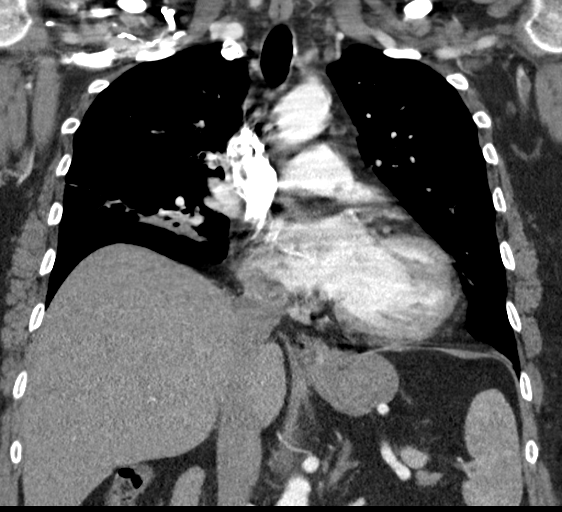
[im 68/91  soft-tissue]
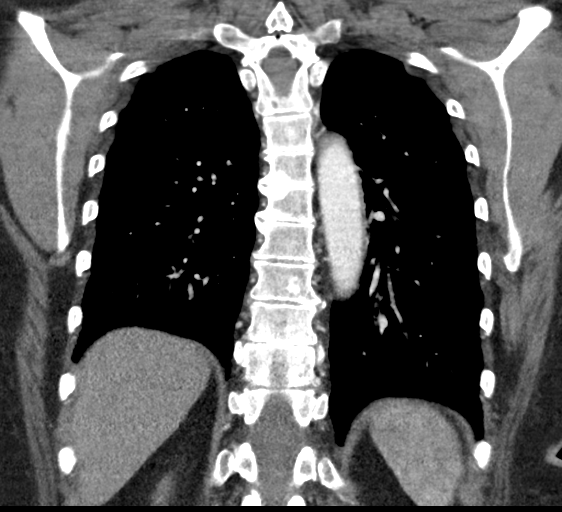

[19 of 46 positions shown; findings below may reference images not displayed]

FINDINGS: Cardiovascular: Satisfactory opacification of the pulmonary arteries
to the segmental level. No evidence of pulmonary embolism. Normal
heart size. No pericardial effusion.

Mediastinum/Nodes: Small sliding-type hiatal hernia is noted.
Thyroid gland is unremarkable. 13 mm subcarinal lymph node is noted
which most likely is reactive in etiology. 11 mm right hilar lymph
node is noted.

Lungs/Pleura: No pneumothorax or pleural effusion is noted. Left
lung is clear. Right middle lobe opacity is noted concerning for
pneumonia or possibly atelectasis.

Upper Abdomen: No acute abnormality.

Musculoskeletal: No chest wall abnormality. No acute or significant
osseous findings.

Review of the MIP images confirms the above findings.
IMPRESSION: No definite evidence of pulmonary embolus.

Small sliding-type hiatal hernia.

Right middle lobe opacity is noted concerning for pneumonia or
subsegmental atelectasis.

Mildly enlarged subcarinal right hilar adenopathy is noted which
most likely is reactive or inflammatory in etiology.

Aortic Atherosclerosis ([IV]-[IV]).

## 2018-05-03 MED ORDER — PREDNISONE 20 MG PO TABS
60.0000 mg | ORAL_TABLET | Freq: Once | ORAL | Status: AC
  Administered 2018-05-03: 60 mg via ORAL
  Filled 2018-05-03: qty 3

## 2018-05-03 MED ORDER — AEROCHAMBER MV MISC: 0 refills | Status: DC

## 2018-05-03 MED ORDER — IPRATROPIUM-ALBUTEROL 0.5-2.5 (3) MG/3ML IN SOLN
3.0000 mL | Freq: Once | RESPIRATORY_TRACT | Status: AC
  Administered 2018-05-03: 3 mL via RESPIRATORY_TRACT
  Filled 2018-05-03: qty 3

## 2018-05-03 MED ORDER — BENZONATATE 100 MG PO CAPS: 100.0000 mg | ORAL_CAPSULE | Freq: Four times a day (QID) | ORAL | 0 refills | Status: DC | PRN

## 2018-05-03 MED ORDER — ALBUTEROL SULFATE HFA 108 (90 BASE) MCG/ACT IN AERS: 2.0000 | INHALATION_SPRAY | Freq: Four times a day (QID) | RESPIRATORY_TRACT | 2 refills | Status: DC | PRN

## 2018-05-03 MED ORDER — PREDNISONE 20 MG PO TABS: 40.0000 mg | ORAL_TABLET | Freq: Every day | ORAL | 0 refills | Status: AC

## 2018-05-03 MED ORDER — IOHEXOL 350 MG/ML SOLN
75.0000 mL | Freq: Once | INTRAVENOUS | Status: AC | PRN
  Administered 2018-05-03: 75 mL via INTRAVENOUS

## 2018-05-03 MED ORDER — IPRATROPIUM-ALBUTEROL 0.5-2.5 (3) MG/3ML IN SOLN
3.0000 mL | Freq: Once | RESPIRATORY_TRACT | Status: AC
Start: 2018-05-03 — End: 2018-05-03
  Administered 2018-05-03: 3 mL via RESPIRATORY_TRACT
  Filled 2018-05-03: qty 3

## 2018-05-03 NOTE — ED Notes (Signed)
Awaiting xrays, po meds given

## 2018-05-03 NOTE — ED Notes (Signed)
Patient awaiting CT

## 2018-05-03 NOTE — ED Triage Notes (Signed)
Patient was seen at Urgent Care on Nov. 18th and diagnosed with pneumonia.  Patient was placed on antibiotics and given an inhaler.  Patient states, "I'm just not getting any better."  Patient reports coughing up dark green phlegm occasionally and feeling very short of breath.

## 2018-05-03 NOTE — ED Provider Notes (Signed)
[facility] Emergency Department Provider Note    First MD Initiated Contact with Patient 05/03/18 1014     (approximate)  I have reviewed the triage vital signs and the nursing notes.   HISTORY  Chief Complaint Shortness of Breath    HPI Erika Anderson is a 67 y.o. female presents the ER for persistent cough shortness of breath since being diagnosed with pneumonia on the 18th this month.  Was given a week's course of doxycycline.  Does not have any fevers or chills but states that she has frequent coughing throughout the night.  States that she is coughing up dark green phlegm.  Is having worsening exertional dyspnea.  Denies any orthopnea or leg swelling.  She does not smoke and denies any history of bronchitis, asthma, COPD.    Past Medical History:  Diagnosis Date  . Hyperlipidemia   . Obesity (BMI 30-39.9)   . Restless legs syndrome (RLS) 12/28/2014  . RLS (restless legs syndrome)    Family History  Problem Relation Age of Onset  . Cancer Mother        ovarian  . Arthritis Sister   . Heart disease Brother        MI  . Stroke Paternal Grandmother   . Cancer Paternal Grandfather        lung   Past Surgical History:  Procedure Laterality Date  . ABDOMINAL HYSTERECTOMY     ovaries remain  . HEMORROIDECTOMY     Patient Active Problem List   Diagnosis Date Noted  . Advanced care planning/counseling discussion 01/11/2017  . Hypercholesteremia 12/30/2015  . Restless legs syndrome (RLS) 12/28/2014  . Obesity (BMI 30-39.9)   . Postmenopausal HRT (hormone replacement therapy) 12/26/2014      Prior to Admission medications   Medication Sig Start Date End Date Taking? Authorizing Provider  aspirin EC 81 MG tablet Take 81 mg by mouth daily.    [provider]  rOPINIRole (REQUIP) 1 MG tablet TAKE 1 TABLET BY MOUTH 4 TIMES DAILY 04/26/18   Marnee Guarneri T, NP    Allergies Patient has no known allergies.    Social  History Social History   Tobacco Use  . Smoking status: Never Smoker  . Smokeless tobacco: Never Used  Substance Use Topics  . Alcohol use: No    Alcohol/week: 0.0 standard drinks  . Drug use: No    Review of Systems Patient denies headaches, rhinorrhea, blurry vision, numbness, shortness of breath, chest pain, edema, cough, abdominal pain, nausea, vomiting, diarrhea, dysuria, fevers, rashes or hallucinations unless otherwise stated above in HPI. ____________________________________________   PHYSICAL EXAM:  VITAL SIGNS: Vitals:   05/03/18 1004  BP: 127/77  Pulse: 80  Resp: 20  Temp: 98 F (36.7 C)  SpO2: 95%    Constitutional: Alert and oriented.  Eyes: Conjunctivae are normal.  Head: Atraumatic. Nose: No congestion/rhinnorhea. Mouth/Throat: Mucous membranes are moist.   Neck: No stridor. Painless ROM.  Cardiovascular: Normal rate, regular rhythm. Grossly normal heart sounds.  Good peripheral circulation. Respiratory: Normal respiratory effort.  No retractions. Lungs with coarse wheezes throughout anterior lung fields.  No rhonchi Gastrointestinal: Soft and nontender. No distention. No abdominal bruits. No CVA tenderness. Genitourinary:  Musculoskeletal: No lower extremity tenderness nor edema.  No joint effusions. Neurologic:  Normal speech and language. No gross focal neurologic deficits are appreciated. No facial droop Skin:  Skin is warm, dry and intact. No rash noted. Psychiatric: Mood and affect are normal. Speech and  behavior are normal.  ____________________________________________   LABS (all labs ordered are listed, but only abnormal results are displayed)  No results found for this or any previous visit (from the past 24 hour(s)). ____________________________________________  EKG My review and personal interpretation at Time: 10:23   Indication: sob  Rate: 75  Rhythm: sinus Axis: normal Other: normal intervals, no  stemi ____________________________________________  RADIOLOGY  I personally reviewed all radiographic images ordered to evaluate for the above acute complaints and reviewed radiology reports and findings.  These findings were personally discussed with the patient.  Please see medical record for radiology report.  ____________________________________________   PROCEDURES  Procedure(s) performed:  Procedures    Critical Care performed: no ____________________________________________   INITIAL IMPRESSION / ASSESSMENT AND PLAN / ED COURSE  Pertinent labs & imaging results that were available during my care of the patient were reviewed by me and considered in my medical decision making (see chart for details).   DDX: Asthma, copd, CHF, pna, ptx, malignancy, Pe, anemia   Erika Anderson is a 67 y.o. who presents to the ED with sob and wheeze.  Clinically sounds like bronchitis.  Will give nebs and steroids.  The patient will be placed on continuous pulse oximetry and telemetry for monitoring.  Laboratory evaluation will be sent to evaluate for the above complaints.     Clinical Course as of May 03 1406  Wed May 03, 2018  1211 Patient does have abnormal appearing right lung fields with probable pneumonia but does have possible effusion.  Based on her presentation with lack of fever or white count will order CT imaging to exclude infarction, parapneumic effusion.   [PR]  1404 Patient able to ambulate without any hypoxia or resp distress.  Certainly is bronchodilator responsive.  CT reassuring.  No Pe.  Port score of 56. And no evidence of active pna at this time.  CT findings likely 2/2 resolving pna.  Will DC home with additional nebulizer treatments and steroids.  Have discussed with the patient and available family all diagnostics and treatments performed thus far and all questions were answered to the best of my ability. The patient demonstrates understanding and agreement with  plan.    [PR]    Clinical Course User Index [PR] Merlyn Lot, MD     As part of my medical decision making, I reviewed the following data within the [facility] notes reviewed and incorporated, Labs reviewed, notes from prior ED visits.   ____________________________________________   FINAL CLINICAL IMPRESSION(S) / ED DIAGNOSES  Final diagnoses:  Bronchitis  SOB (shortness of breath)      NEW MEDICATIONS STARTED DURING THIS VISIT:  New Prescriptions   No medications on file     Note:  This document was prepared using Dragon voice recognition software and may include unintentional dictation errors.    Merlyn Lot, MD 05/03/18 [phone]

## 2018-05-03 NOTE — ED Notes (Signed)
Pt was able to ambulate without assistance. SPO2 immediately before ambulation was 95% on room air. During ambulation SPO2 value dropped to 92%. Value remained at 92% when returned to bed. Pt stated she felt short of breath during walk.

## 2018-05-05 DIAGNOSIS — R69 Illness, unspecified: Secondary | ICD-10-CM | POA: Diagnosis not present

## 2018-05-08 ENCOUNTER — Other Ambulatory Visit: Payer: Self-pay | Admitting: Physician Assistant

## 2018-05-08 DIAGNOSIS — Z1231 Encounter for screening mammogram for malignant neoplasm of breast: Secondary | ICD-10-CM

## 2018-05-10 ENCOUNTER — Encounter: Payer: Self-pay | Admitting: Family Medicine

## 2018-05-10 ENCOUNTER — Ambulatory Visit (INDEPENDENT_AMBULATORY_CARE_PROVIDER_SITE_OTHER): Payer: Medicare HMO | Admitting: Family Medicine

## 2018-05-10 VITALS — BP 118/77 | HR 73 | Temp 98.1°F | Wt 240.0 lb

## 2018-05-10 DIAGNOSIS — J40 Bronchitis, not specified as acute or chronic: Secondary | ICD-10-CM | POA: Diagnosis not present

## 2018-05-10 MED ORDER — BENZONATATE 200 MG PO CAPS: 200.0000 mg | ORAL_CAPSULE | Freq: Three times a day (TID) | ORAL | 0 refills | Status: DC | PRN

## 2018-05-10 MED ORDER — FLUTICASONE PROPIONATE 50 MCG/ACT NA SUSP
2.0000 | Freq: Two times a day (BID) | NASAL | 6 refills | Status: DC
Start: 2018-05-10 — End: 2019-04-09

## 2018-05-10 MED ORDER — HYDROCOD POLST-CPM POLST ER 10-8 MG/5ML PO SUER: 5.0000 mL | Freq: Two times a day (BID) | ORAL | 0 refills | Status: DC | PRN

## 2018-05-10 NOTE — Progress Notes (Signed)
BP 118/77   Pulse 73   Temp 98.1 F (36.7 C) (Oral)   Wt 240 lb (108.9 kg)   SpO2 96%   BMI 37.59 kg/m    Subjective:    Patient ID: Erika Anderson, female    DOB: 1951-03-29, 67 y.o.   MRN: [id number]  HPI: Erika Anderson is a 68 y.o. female  Chief Complaint  Patient presents with  . Cough    04/24/18 started feeling bad. Went to UC 1 round of doxy. Went back to UC given prednisode. Did have Xrays, CT, etc.   Going on 3 weeks of productive cough, congestion, chest tightness. Has been treated for pneumonia with doxycycline, then given neb tx and prednisone at the ER for bronchitis. CT and x-rays there without concerning findings. Still having productive cough, fatigue, and post nasal drainage. Currently just taking albuterol inhaler and tessalon perles as needed. Non smoker, no hx of pulmonary dz. States this has never happened to her before.  Past Medical History:  Diagnosis Date  . Hyperlipidemia   . Obesity (BMI 30-39.9)   . Restless legs syndrome (RLS) 12/28/2014  . RLS (restless legs syndrome)    Social History   Socioeconomic History  . Marital status: Married    Spouse name: Not on file  . Number of children: Not on file  . Years of education: 59  . Highest education level: High school graduate  Occupational History  . Not on file  Social Needs  . Financial resource strain: Not hard at all  . Food insecurity:    Worry: Never true    Inability: Never true  . Transportation needs:    Medical: No    Non-medical: No  Tobacco Use  . Smoking status: Never Smoker  . Smokeless tobacco: Never Used  Substance and Sexual Activity  . Alcohol use: No    Alcohol/week: 0.0 standard drinks  . Drug use: No  . Sexual activity: Never  Lifestyle  . Physical activity:    Days per week: 0 days    Minutes per session: 0 min  . Stress: Not at all  Relationships  . Social connections:    Talks on phone: More than three times a week    Gets together: More than  three times a week    Attends religious service: More than 4 times per year    Active member of club or organization: No    Attends meetings of clubs or organizations: Never    Relationship status: Married  . Intimate partner violence:    Fear of current or ex partner: No    Emotionally abused: No    Physically abused: No    Forced sexual activity: No  Other Topics Concern  . Not on file  Social History Narrative  . Not on file     Relevant past medical, surgical, family and social history reviewed and updated as indicated. Interim medical history since our last visit reviewed. Allergies and medications reviewed and updated.  Review of Systems  Per HPI unless specifically indicated above     Objective:    BP 118/77   Pulse 73   Temp 98.1 F (36.7 C) (Oral)   Wt 240 lb (108.9 kg)   SpO2 96%   BMI 37.59 kg/m   Wt Readings from Last 3 Encounters:  05/10/18 240 lb (108.9 kg)  05/03/18 233 lb (105.7 kg)  01/16/18 238 lb 6.4 oz (108.1 kg)    Physical Exam  Constitutional:  She is oriented to person, place, and time. She appears well-developed and well-nourished. No distress.  HENT:  Head: Atraumatic.  Right Ear: External ear normal.  Left Ear: External ear normal.  Nose: Nose normal.  Oropharynx erythematous   Eyes: Pupils are equal, round, and reactive to light. Conjunctivae and EOM are normal.  Neck: Normal range of motion. Neck supple.  Cardiovascular: Normal rate, regular rhythm and normal heart sounds.  Pulmonary/Chest: Effort normal. No respiratory distress. She has wheezes (minimal, diffuse). She has no rales.  Musculoskeletal: Normal range of motion.  Lymphadenopathy:    She has no cervical adenopathy.  Neurological: She is alert and oriented to person, place, and time.  Skin: Skin is warm and dry. No rash noted.  Psychiatric: She has a normal mood and affect. Her behavior is normal.  Nursing note and vitals reviewed.   Results for orders placed or  performed during the hospital encounter of 05/03/18  CBC with Differential/Platelet  Result Value Ref Range   WBC 8.8 4.0 - 10.5 K/uL   RBC 4.45 3.87 - 5.11 MIL/uL   Hemoglobin 13.4 12.0 - 15.0 g/dL   HCT 40.1 36.0 - 46.0 %   MCV 90.1 80.0 - 100.0 fL   MCH 30.1 26.0 - 34.0 pg   MCHC 33.4 30.0 - 36.0 g/dL   RDW 13.0 11.5 - 15.5 %   Platelets 263 150 - 400 K/uL   nRBC 0.0 0.0 - 0.2 %   Neutrophils Relative % 59 %   Neutro Abs 5.2 1.7 - 7.7 K/uL   Lymphocytes Relative 32 %   Lymphs Abs 2.8 0.7 - 4.0 K/uL   Monocytes Relative 7 %   Monocytes Absolute 0.6 0.1 - 1.0 K/uL   Eosinophils Relative 1 %   Eosinophils Absolute 0.1 0.0 - 0.5 K/uL   Basophils Relative 1 %   Basophils Absolute 0.1 0.0 - 0.1 K/uL   Immature Granulocytes 0 %   Abs Immature Granulocytes 0.02 0.00 - 0.07 K/uL  Comprehensive metabolic panel  Result Value Ref Range   Sodium 139 135 - 145 mmol/L   Potassium 4.1 3.5 - 5.1 mmol/L   Chloride 102 98 - 111 mmol/L   CO2 28 22 - 32 mmol/L   Glucose, Bld 105 (H) 70 - 99 mg/dL   BUN 18 8 - 23 mg/dL   Creatinine, Ser 0.72 0.44 - 1.00 mg/dL   Calcium 9.5 8.9 - 10.3 mg/dL   Total Protein 7.3 6.5 - 8.1 g/dL   Albumin 4.3 3.5 - 5.0 g/dL   AST 74 (H) 15 - 41 U/L   ALT 96 (H) 0 - 44 U/L   Alkaline Phosphatase 85 38 - 126 U/L   Total Bilirubin 0.4 0.3 - 1.2 mg/dL   GFR calc non Af Amer >60 >60 mL/min   GFR calc Af Amer >60 >60 mL/min   Anion gap 9 5 - 15      Assessment & Plan:   Problem List Items Addressed This Visit    None    Visit Diagnoses    Bronchitis    -  Primary   Add tussionex, steroid inhaler sample given with instructions on use. Continue tessalon, mucinex, flonase, supportive care. F/u if not improving       Follow up plan: Return if symptoms worsen or fail to improve.

## 2018-05-10 NOTE — Patient Instructions (Signed)
Flonase twice daily Plain mucinex twice daily Symbicort inhaler twice daily - rinse mouth with water after use Albuterol inhaler as needed Tessalon perles for daytime cough Tussionex syrup for nighttime cough - no driving after taking

## 2018-05-22 DIAGNOSIS — J189 Pneumonia, unspecified organism: Secondary | ICD-10-CM | POA: Diagnosis not present

## 2018-05-22 DIAGNOSIS — R918 Other nonspecific abnormal finding of lung field: Secondary | ICD-10-CM | POA: Diagnosis not present

## 2018-05-22 DIAGNOSIS — R05 Cough: Secondary | ICD-10-CM | POA: Diagnosis not present

## 2018-06-15 ENCOUNTER — Ambulatory Visit
Admission: RE | Admit: 2018-06-15 | Discharge: 2018-06-15 | Disposition: A | Discharge status: Home / Self Care | Payer: Medicare HMO | Source: Ambulatory Visit | Attending: Physician Assistant | Admitting: Physician Assistant

## 2018-06-15 DIAGNOSIS — Z1231 Encounter for screening mammogram for malignant neoplasm of breast: Secondary | ICD-10-CM | POA: Diagnosis not present

## 2018-06-15 IMAGING — MG DIGITAL SCREENING BILATERAL MAMMOGRAM WITH TOMO AND CAD
8 series · 8 of 24 positions shown · non-contrast
Comparison: Previous exam(s).

CLINICAL DATA: Screening.

EXAM:
DIGITAL SCREENING BILATERAL MAMMOGRAM WITH TOMO AND CAD

[L CC synth-2D]
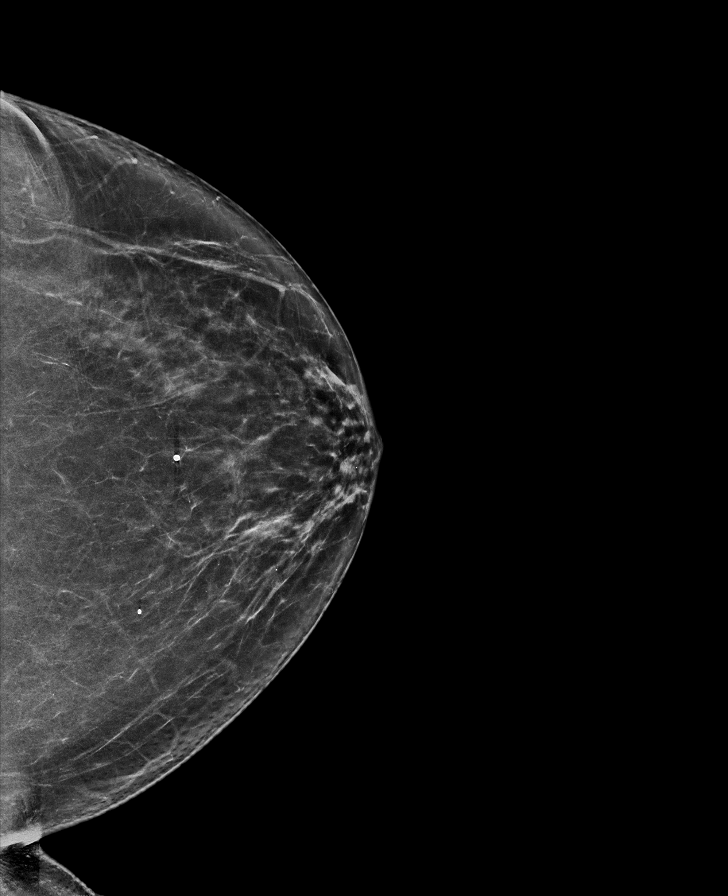

[R CC synth-2D]
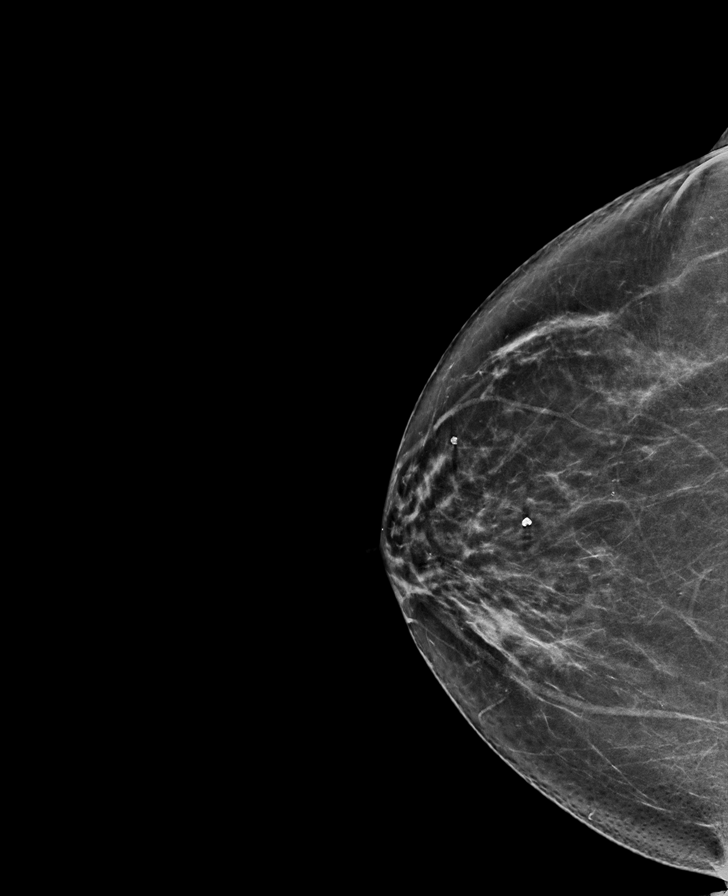

[R MLO synth-2D]
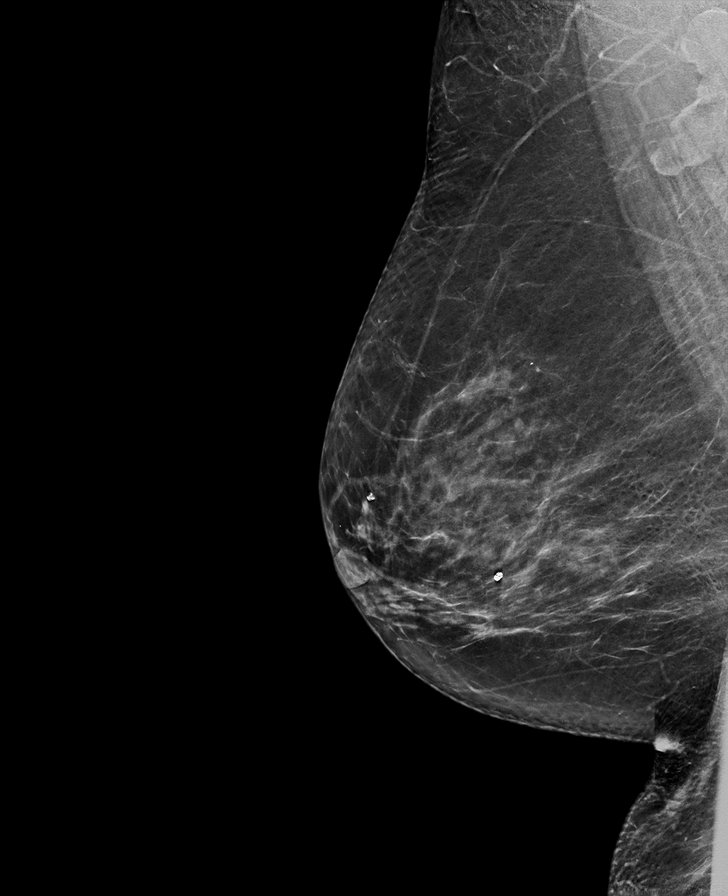

[L MLO synth-2D]
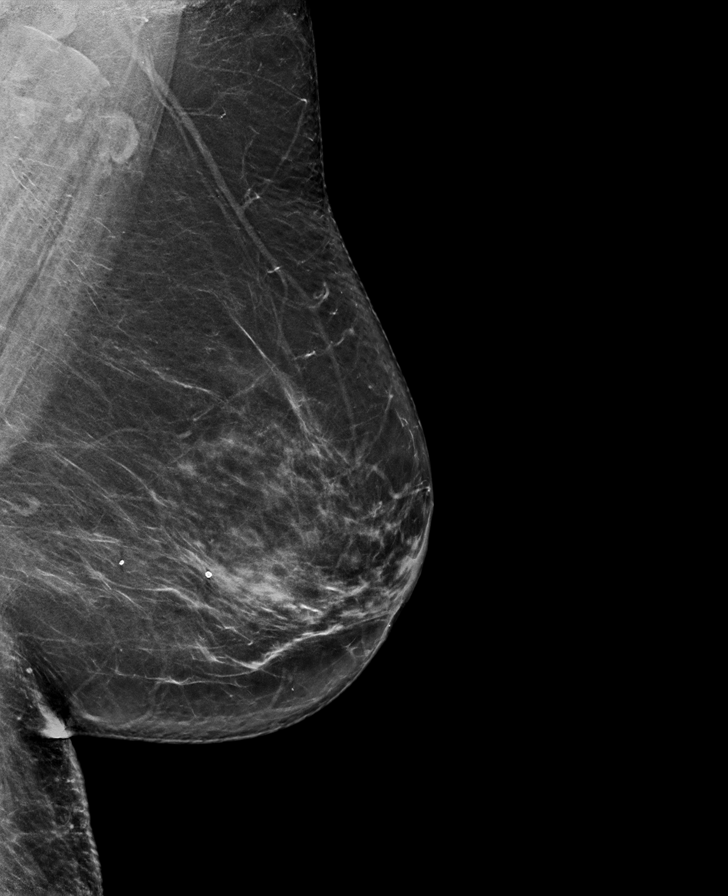

[L CC tomo · tomo slice 37/73.0]
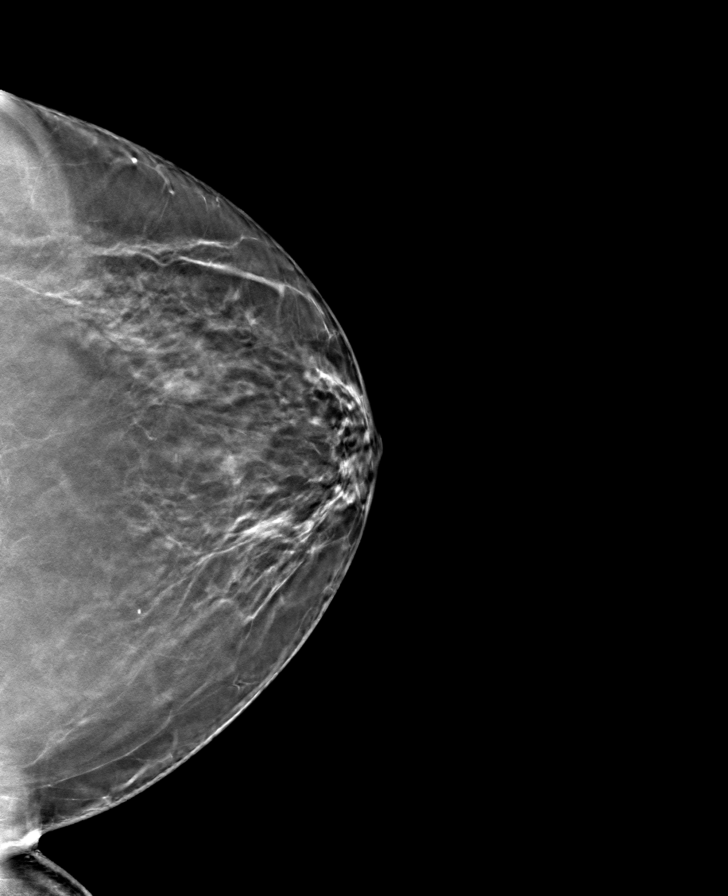

[R MLO tomo · tomo slice 39/78.0]
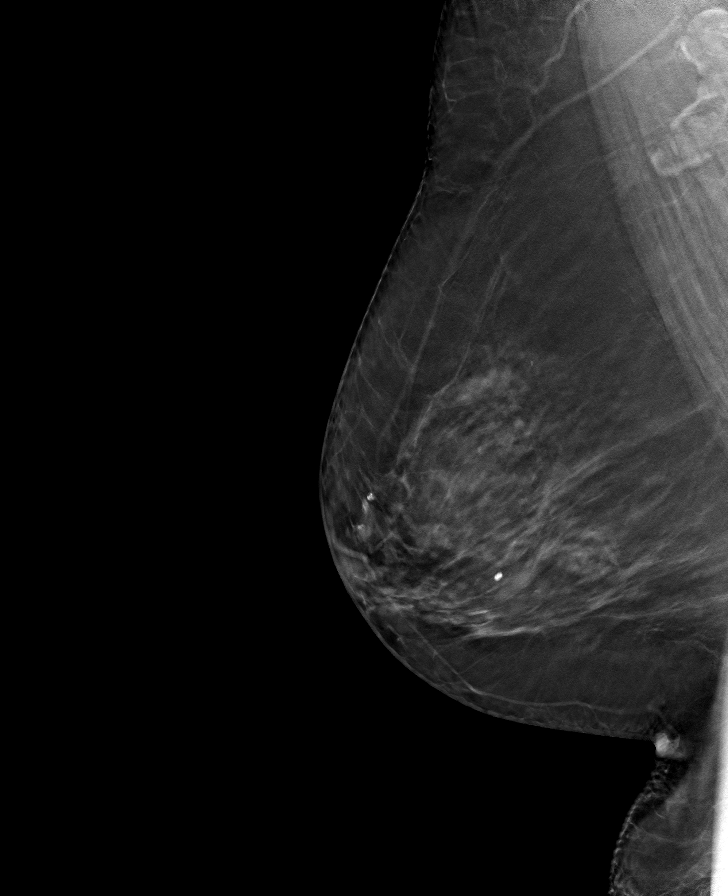

[L MLO tomo · tomo slice 41/81.0]
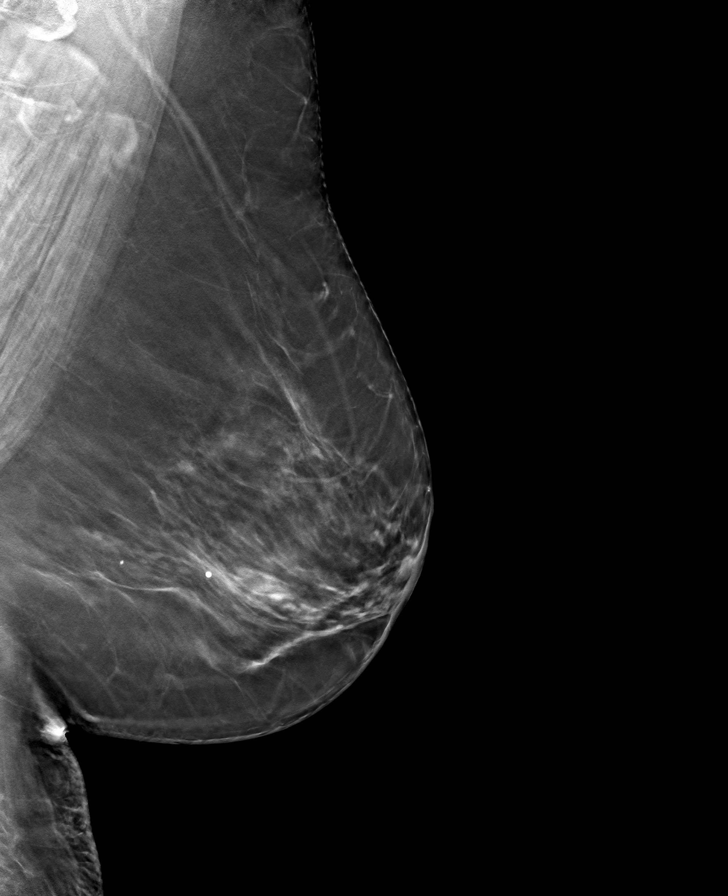

[R CC tomo · tomo slice 37/72.0]
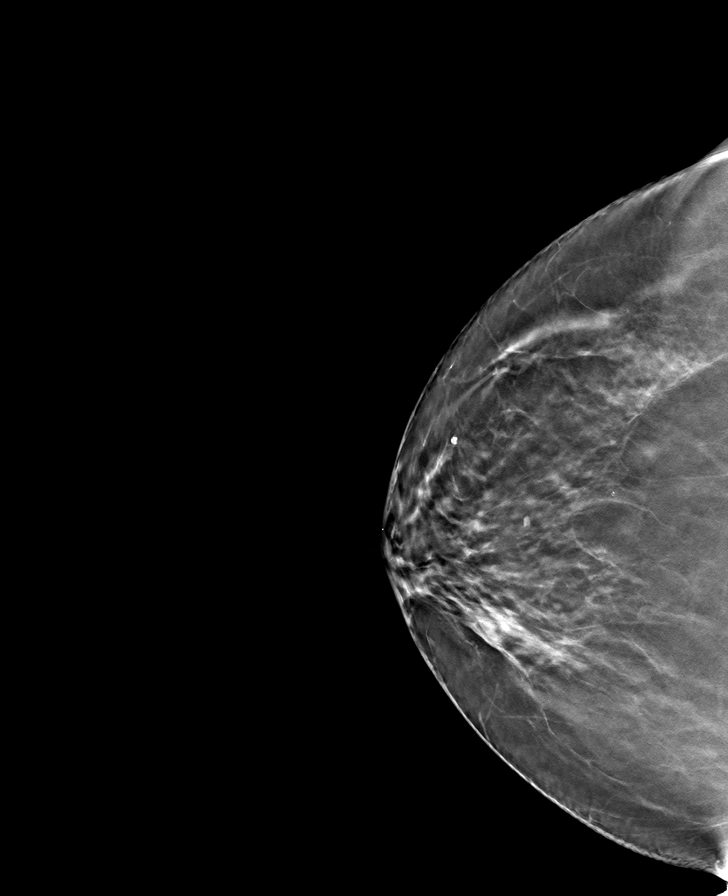

[8 of 24 positions shown; findings below may reference images not displayed]

ACR Breast Density Category b: There are scattered areas of
fibroglandular density.
FINDINGS: There are no findings suspicious for malignancy. Images were
processed with CAD.
IMPRESSION: No mammographic evidence of malignancy. A result letter of this
screening mammogram will be mailed directly to the patient.

RECOMMENDATION:
Screening mammogram in one year. (Code:[TQ])

BI-RADS CATEGORY  1: Negative.

## 2018-06-16 DIAGNOSIS — R740 Nonspecific elevation of levels of transaminase and lactic acid dehydrogenase [LDH]: Secondary | ICD-10-CM | POA: Diagnosis not present

## 2018-06-16 DIAGNOSIS — R05 Cough: Secondary | ICD-10-CM | POA: Diagnosis not present

## 2018-06-16 DIAGNOSIS — G4719 Other hypersomnia: Secondary | ICD-10-CM | POA: Diagnosis not present

## 2018-06-17 DIAGNOSIS — R69 Illness, unspecified: Secondary | ICD-10-CM | POA: Diagnosis not present

## 2018-06-22 DIAGNOSIS — R69 Illness, unspecified: Secondary | ICD-10-CM | POA: Diagnosis not present

## 2018-06-28 DIAGNOSIS — R079 Chest pain, unspecified: Secondary | ICD-10-CM | POA: Diagnosis not present

## 2018-06-28 DIAGNOSIS — R0602 Shortness of breath: Secondary | ICD-10-CM | POA: Diagnosis not present

## 2018-06-29 DIAGNOSIS — R69 Illness, unspecified: Secondary | ICD-10-CM | POA: Diagnosis not present

## 2018-07-04 DIAGNOSIS — E7800 Pure hypercholesterolemia, unspecified: Secondary | ICD-10-CM | POA: Diagnosis not present

## 2018-07-04 DIAGNOSIS — R0602 Shortness of breath: Secondary | ICD-10-CM | POA: Diagnosis not present

## 2018-07-04 DIAGNOSIS — R079 Chest pain, unspecified: Secondary | ICD-10-CM | POA: Diagnosis not present

## 2018-07-24 ENCOUNTER — Other Ambulatory Visit: Payer: Self-pay | Admitting: Nurse Practitioner

## 2018-07-24 DIAGNOSIS — G2581 Restless legs syndrome: Secondary | ICD-10-CM

## 2018-07-25 DIAGNOSIS — R69 Illness, unspecified: Secondary | ICD-10-CM | POA: Diagnosis not present

## 2018-07-28 DIAGNOSIS — J4540 Moderate persistent asthma, uncomplicated: Secondary | ICD-10-CM | POA: Diagnosis not present

## 2018-08-15 ENCOUNTER — Other Ambulatory Visit: Payer: Self-pay

## 2018-08-17 ENCOUNTER — Other Ambulatory Visit: Payer: Self-pay

## 2018-08-17 NOTE — Patient Outreach (Signed)
[place] [facility]) Care Management  08/17/2018  Kayti Poss [facility] 02/12/1951 [id number]   Original Referral Date:  Quinn Plowman 07/31/2018 Referral Source: self referral Referral Reason: Prescription assistance   Outreach Attempt: spoke with patient.  She states that she was needing some assistance with paying for Symbicort.  She states that she now thinks she does not need it as she is much better from what she had going on.    Discussed with patient [facility] services and how referral works.  Patient states she does not wish to pursue referral as she is much better and has other inhalers to use just in case she has problems.    Plan: RN CM will close case.    Jone Baseman, RN, MSN [facility] Care Management Care Management Coordinator Direct Line [phone] Toll Free: [phone]  Fax: [phone]

## 2018-08-18 ENCOUNTER — Ambulatory Visit: Payer: Self-pay

## 2018-09-06 DIAGNOSIS — R69 Illness, unspecified: Secondary | ICD-10-CM | POA: Diagnosis not present

## 2018-10-23 ENCOUNTER — Other Ambulatory Visit: Payer: Self-pay

## 2018-10-23 DIAGNOSIS — G2581 Restless legs syndrome: Secondary | ICD-10-CM

## 2018-10-23 MED ORDER — ROPINIROLE HCL 1 MG PO TABS: ORAL_TABLET | ORAL | 1 refills | Status: DC

## 2018-10-23 NOTE — Telephone Encounter (Signed)
[facility] faxed a Rx refill request on Ropinirole 1mg  tab

## 2018-12-07 ENCOUNTER — Encounter: Payer: Self-pay | Admitting: Family Medicine

## 2018-12-07 ENCOUNTER — Other Ambulatory Visit: Payer: Self-pay

## 2018-12-07 ENCOUNTER — Ambulatory Visit (INDEPENDENT_AMBULATORY_CARE_PROVIDER_SITE_OTHER): Payer: Medicare HMO | Admitting: Family Medicine

## 2018-12-07 DIAGNOSIS — M5432 Sciatica, left side: Secondary | ICD-10-CM

## 2018-12-07 MED ORDER — CYCLOBENZAPRINE HCL 5 MG PO TABS: 5.0000 mg | ORAL_TABLET | Freq: Three times a day (TID) | ORAL | 0 refills | Status: DC | PRN

## 2018-12-07 MED ORDER — PREDNISONE 10 MG PO TABS: ORAL_TABLET | ORAL | 0 refills | Status: DC

## 2018-12-07 NOTE — Progress Notes (Signed)
There were no vitals taken for this visit.   Subjective:    Patient ID: Erika Anderson, female    DOB: September 17, 1950, 68 y.o.   MRN: [id number]  HPI: Erika Anderson is a 68 y.o. female  Chief Complaint  Patient presents with  . Back Pain    pt states she was outside painting a deck on Tuesday, started hurting then. Mainly on left side and states it runs all the way down her leg     . This visit was completed via WebEx due to the restrictions of the COVID-19 pandemic. All issues as above were discussed and addressed. Physical exam was done as above through visual confirmation on WebEx. If it was felt that the patient should be evaluated in the office, they were directed there. The patient verbally consented to this visit. . Location of the patient: home . Location of the provider: work . Those involved with this call:  . Provider: Merrie Roof, PA-C . CMA: Lesle Chris, [place] . Front Desk/Registration: Jill Side  . Time spent on call: 15 minutes with patient face to face via video conference. More than 50% of this time was spent in counseling and coordination of care. 5 minutes total spent in review of patient's record and preparation of their chart. I verified patient identity using two factors (patient name and date of birth). Patient consents verbally to being seen via telemedicine visit today.   Was working on her deck about 6 days ago and then had some achy back pains, now shooting pains down left leg with numbness and tingling down toward foot. Taking aleve and ibuprofen with no relief. Has been trying some stretches and heating pads which does help very mildly. Denies hx of back injuries, fevers, incontinence, saddle paresthesias, falls, leg weakness.   Relevant past medical, surgical, family and social history reviewed and updated as indicated. Interim medical history since our last visit reviewed. Allergies and medications reviewed and updated.  Review of Systems  Per  HPI unless specifically indicated above     Objective:    There were no vitals taken for this visit.  Wt Readings from Last 3 Encounters:  05/10/18 240 lb (108.9 kg)  05/03/18 233 lb (105.7 kg)  01/16/18 238 lb 6.4 oz (108.1 kg)    Physical Exam Vitals signs and nursing note reviewed.  Constitutional:      General: She is not in acute distress.    Appearance: Normal appearance.  HENT:     Head: Atraumatic.     Right Ear: External ear normal.     Left Ear: External ear normal.     Nose: Nose normal. No congestion.     Mouth/Throat:     Mouth: Mucous membranes are moist.     Pharynx: Oropharynx is clear. No posterior oropharyngeal erythema.  Eyes:     Extraocular Movements: Extraocular movements intact.     Conjunctiva/sclera: Conjunctivae normal.  Neck:     Musculoskeletal: Normal range of motion.  Cardiovascular:     Comments: Unable to assess via virtual visit Pulmonary:     Effort: Pulmonary effort is normal. No respiratory distress.  Musculoskeletal:     Comments: Pt laying on couch supine, declines getting up for ROM exam d/t pain  Skin:    General: Skin is dry.     Findings: No erythema.  Neurological:     Mental Status: She is alert and oriented to person, place, and time.  Psychiatric:  Mood and Affect: Mood normal.        Thought Content: Thought content normal.        Judgment: Judgment normal.     Results for orders placed or performed during the hospital encounter of 05/03/18  CBC with Differential/Platelet  Result Value Ref Range   WBC 8.8 4.0 - 10.5 K/uL   RBC 4.45 3.87 - 5.11 MIL/uL   Hemoglobin 13.4 12.0 - 15.0 g/dL   HCT 40.1 36.0 - 46.0 %   MCV 90.1 80.0 - 100.0 fL   MCH 30.1 26.0 - 34.0 pg   MCHC 33.4 30.0 - 36.0 g/dL   RDW 13.0 11.5 - 15.5 %   Platelets 263 150 - 400 K/uL   nRBC 0.0 0.0 - 0.2 %   Neutrophils Relative % 59 %   Neutro Abs 5.2 1.7 - 7.7 K/uL   Lymphocytes Relative 32 %   Lymphs Abs 2.8 0.7 - 4.0 K/uL   Monocytes  Relative 7 %   Monocytes Absolute 0.6 0.1 - 1.0 K/uL   Eosinophils Relative 1 %   Eosinophils Absolute 0.1 0.0 - 0.5 K/uL   Basophils Relative 1 %   Basophils Absolute 0.1 0.0 - 0.1 K/uL   Immature Granulocytes 0 %   Abs Immature Granulocytes 0.02 0.00 - 0.07 K/uL  Comprehensive metabolic panel  Result Value Ref Range   Sodium 139 135 - 145 mmol/L   Potassium 4.1 3.5 - 5.1 mmol/L   Chloride 102 98 - 111 mmol/L   CO2 28 22 - 32 mmol/L   Glucose, Bld 105 (H) 70 - 99 mg/dL   BUN 18 8 - 23 mg/dL   Creatinine, Ser 0.72 0.44 - 1.00 mg/dL   Calcium 9.5 8.9 - 10.3 mg/dL   Total Protein 7.3 6.5 - 8.1 g/dL   Albumin 4.3 3.5 - 5.0 g/dL   AST 74 (H) 15 - 41 U/L   ALT 96 (H) 0 - 44 U/L   Alkaline Phosphatase 85 38 - 126 U/L   Total Bilirubin 0.4 0.3 - 1.2 mg/dL   GFR calc non Af Amer >60 >60 mL/min   GFR calc Af Amer >60 >60 mL/min   Anion gap 9 5 - 15      Assessment & Plan:   Problem List Items Addressed This Visit    None    Visit Diagnoses    Left sided sciatica    -  Primary   Tx with prednisone taper, low dose flexeril, stretches, massage, heat. F/u if not improving or worsening   Relevant Medications   cyclobenzaprine (FLEXERIL) 5 MG tablet       Follow up plan: Return if symptoms worsen or fail to improve.

## 2018-12-15 ENCOUNTER — Telehealth: Payer: Self-pay | Admitting: Family Medicine

## 2018-12-15 MED ORDER — CYCLOBENZAPRINE HCL 5 MG PO TABS: 5.0000 mg | ORAL_TABLET | Freq: Three times a day (TID) | ORAL | 0 refills | Status: DC | PRN

## 2018-12-15 NOTE — Telephone Encounter (Signed)
Medication: cyclobenzaprine (FLEXERIL) 5 MG tablet [[id number]] Patient is requesting a refill because her Left hip is still locking up a bit. She said if it continues she will call for an Appt on Monday.  Has the patient contacted their pharmacy ? Yes  (Agent: If no, request that the patient contact the pharmacy for the refill.) (Agent: If yes, when and what did the pharmacy advise?)  Preferred Pharmacy (with phone number or street name):[facility] [address], [place] - [place] [phone] (Phone) [phone] (Fax)    Agent: Please be advised that RX refills may take up to 3 business days. We ask that you follow-up with your pharmacy.

## 2018-12-15 NOTE — Telephone Encounter (Signed)
Rx sent 

## 2018-12-18 ENCOUNTER — Telehealth: Payer: Self-pay | Admitting: Family Medicine

## 2018-12-18 NOTE — Telephone Encounter (Signed)
Pt saw rachel on 12/07/2018 and she is still having left side sciatica pain and would like to know the next step

## 2018-12-18 NOTE — Telephone Encounter (Signed)
Needs appt

## 2018-12-19 ENCOUNTER — Other Ambulatory Visit: Payer: Self-pay

## 2018-12-19 ENCOUNTER — Ambulatory Visit: Payer: Medicare HMO | Admitting: Nurse Practitioner

## 2018-12-19 ENCOUNTER — Ambulatory Visit (INDEPENDENT_AMBULATORY_CARE_PROVIDER_SITE_OTHER): Payer: Medicare HMO | Admitting: Family Medicine

## 2018-12-19 ENCOUNTER — Encounter: Payer: Self-pay | Admitting: Family Medicine

## 2018-12-19 DIAGNOSIS — M5432 Sciatica, left side: Secondary | ICD-10-CM

## 2018-12-19 MED ORDER — PREDNISONE 10 MG PO TABS: ORAL_TABLET | ORAL | 0 refills | Status: DC

## 2018-12-19 NOTE — Progress Notes (Signed)
There were no vitals taken for this visit.   Subjective:    Patient ID: Erika Anderson, female    DOB: 02-15-1951, 68 y.o.   MRN: [id number]  HPI: Erika Anderson is a 68 y.o. female  Chief Complaint  Patient presents with  . Hip Pain    Left, painted her deck, was on her feet a lot    . This visit was completed via WebEx due to the restrictions of the COVID-19 pandemic. All issues as above were discussed and addressed. Physical exam was done as above through visual confirmation on WebEx. If it was felt that the patient should be evaluated in the office, they were directed there. The patient verbally consented to this visit. . Location of the patient: home . Location of the provider: home . Those involved with this call:  . Provider: Merrie Roof, PA-C . CMA: Tiffany Reel, CMA . Front Desk/Registration: Jill Side  . Time spent on call: 15 minutes with patient face to face via video conference. More than 50% of this time was spent in counseling and coordination of care. 5 minutes total spent in review of patient's record and preparation of their chart. I verified patient identity using two factors (patient name and date of birth). Patient consents verbally to being seen via telemedicine visit today.   Here today following up on persistent left hip pain going down the side of the leg all the way down into toes. This has been ongoing for about 2 weeks now after working on her deck. When weight bearing or turning in the bed, shoots pain that is 10/10. Has now tried prednisone and muscle relaxers which helped initially but sxs returned. Has mostly been laying flat on her back as this is the only position of relief, but does try to do small tasks around the house and stretches occasionally. Denies significant numbness or tingling down leg, bowel or bladder incontinence, fevers, saddle paresthesias. No hx of back injuries.   Relevant past medical, surgical, family and social history  reviewed and updated as indicated. Interim medical history since our last visit reviewed. Allergies and medications reviewed and updated.  Review of Systems  Per HPI unless specifically indicated above     Objective:    There were no vitals taken for this visit.  Wt Readings from Last 3 Encounters:  05/10/18 240 lb (108.9 kg)  05/03/18 233 lb (105.7 kg)  01/16/18 238 lb 6.4 oz (108.1 kg)    Physical Exam Vitals signs and nursing note reviewed.  Constitutional:      General: She is not in acute distress.    Appearance: Normal appearance.  HENT:     Head: Atraumatic.     Right Ear: External ear normal.     Left Ear: External ear normal.     Nose: Nose normal. No congestion.     Mouth/Throat:     Mouth: Mucous membranes are moist.     Pharynx: Oropharynx is clear. No posterior oropharyngeal erythema.  Eyes:     Extraocular Movements: Extraocular movements intact.     Conjunctiva/sclera: Conjunctivae normal.  Neck:     Musculoskeletal: Normal range of motion.  Cardiovascular:     Comments: Unable to assess via virtual visit Pulmonary:     Effort: Pulmonary effort is normal. No respiratory distress.  Musculoskeletal:     Comments: Laying on couch on her back, declines getting up for exam  Skin:    General: Skin is dry.  Findings: No erythema.  Neurological:     Mental Status: She is alert and oriented to person, place, and time.  Psychiatric:        Mood and Affect: Mood normal.        Thought Content: Thought content normal.        Judgment: Judgment normal.     Results for orders placed or performed during the hospital encounter of 05/03/18  CBC with Differential/Platelet  Result Value Ref Range   WBC 8.8 4.0 - 10.5 K/uL   RBC 4.45 3.87 - 5.11 MIL/uL   Hemoglobin 13.4 12.0 - 15.0 g/dL   HCT 40.1 36.0 - 46.0 %   MCV 90.1 80.0 - 100.0 fL   MCH 30.1 26.0 - 34.0 pg   MCHC 33.4 30.0 - 36.0 g/dL   RDW 13.0 11.5 - 15.5 %   Platelets 263 150 - 400 K/uL   nRBC  0.0 0.0 - 0.2 %   Neutrophils Relative % 59 %   Neutro Abs 5.2 1.7 - 7.7 K/uL   Lymphocytes Relative 32 %   Lymphs Abs 2.8 0.7 - 4.0 K/uL   Monocytes Relative 7 %   Monocytes Absolute 0.6 0.1 - 1.0 K/uL   Eosinophils Relative 1 %   Eosinophils Absolute 0.1 0.0 - 0.5 K/uL   Basophils Relative 1 %   Basophils Absolute 0.1 0.0 - 0.1 K/uL   Immature Granulocytes 0 %   Abs Immature Granulocytes 0.02 0.00 - 0.07 K/uL  Comprehensive metabolic panel  Result Value Ref Range   Sodium 139 135 - 145 mmol/L   Potassium 4.1 3.5 - 5.1 mmol/L   Chloride 102 98 - 111 mmol/L   CO2 28 22 - 32 mmol/L   Glucose, Bld 105 (H) 70 - 99 mg/dL   BUN 18 8 - 23 mg/dL   Creatinine, Ser 0.72 0.44 - 1.00 mg/dL   Calcium 9.5 8.9 - 10.3 mg/dL   Total Protein 7.3 6.5 - 8.1 g/dL   Albumin 4.3 3.5 - 5.0 g/dL   AST 74 (H) 15 - 41 U/L   ALT 96 (H) 0 - 44 U/L   Alkaline Phosphatase 85 38 - 126 U/L   Total Bilirubin 0.4 0.3 - 1.2 mg/dL   GFR calc non Af Amer >60 >60 mL/min   GFR calc Af Amer >60 >60 mL/min   Anion gap 9 5 - 15      Assessment & Plan:   Problem List Items Addressed This Visit    None    Visit Diagnoses    Left sided sciatica    -  Primary   Will repeat prednisone taper, continue muscle relaxers, encourage more stretching and gentle movement, epsom salt soaks. Consider imaging and PT in future       Follow up plan: Return if symptoms worsen or fail to improve.

## 2018-12-25 ENCOUNTER — Ambulatory Visit
Admission: RE | Admit: 2018-12-25 | Discharge: 2018-12-25 | Disposition: A | Discharge status: Home / Self Care | Payer: Medicare HMO | Source: Ambulatory Visit | Attending: Family Medicine | Admitting: Family Medicine

## 2018-12-25 ENCOUNTER — Telehealth: Payer: Self-pay | Admitting: Family Medicine

## 2018-12-25 DIAGNOSIS — M5432 Sciatica, left side: Secondary | ICD-10-CM | POA: Diagnosis not present

## 2018-12-25 DIAGNOSIS — M545 Low back pain: Secondary | ICD-10-CM | POA: Diagnosis not present

## 2018-12-25 IMAGING — CR LUMBAR SPINE - COMPLETE 4+ VIEW
1 series · 6 of 6 positions shown · non-contrast
Comparison: None.

CLINICAL DATA: Low back pain radiating into the left leg for the
past 3 weeks.

EXAM:
LUMBAR SPINE - COMPLETE 4+ VIEW

[Series 1: dg lumbar spine complete 4 +v · 0.14mm/px · 6 of 6 slices shown]
[im 1/6]
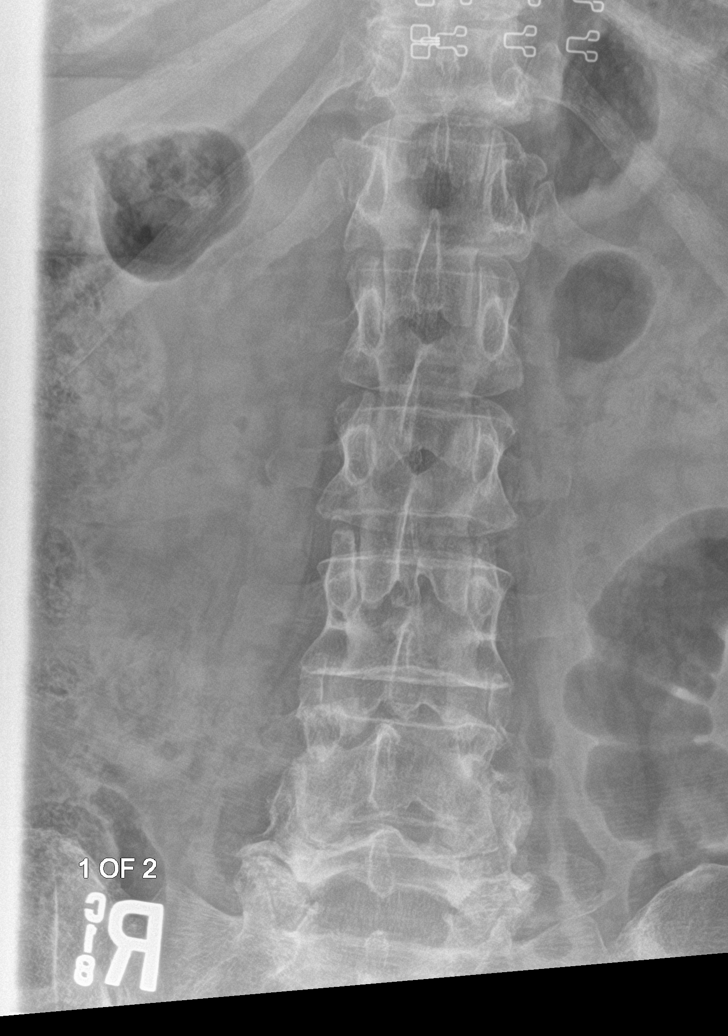
[im 2/6]
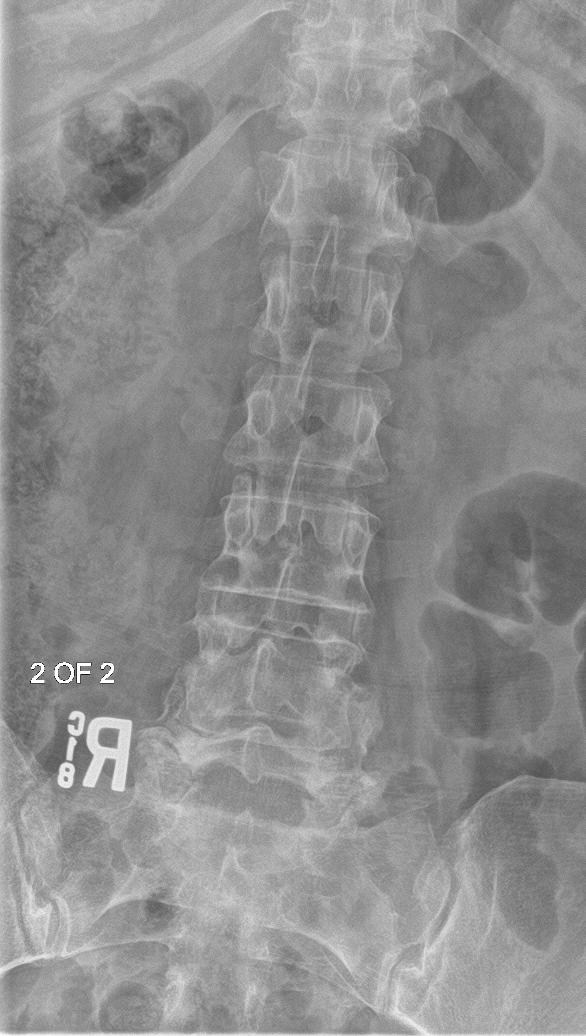
[im 3/6]
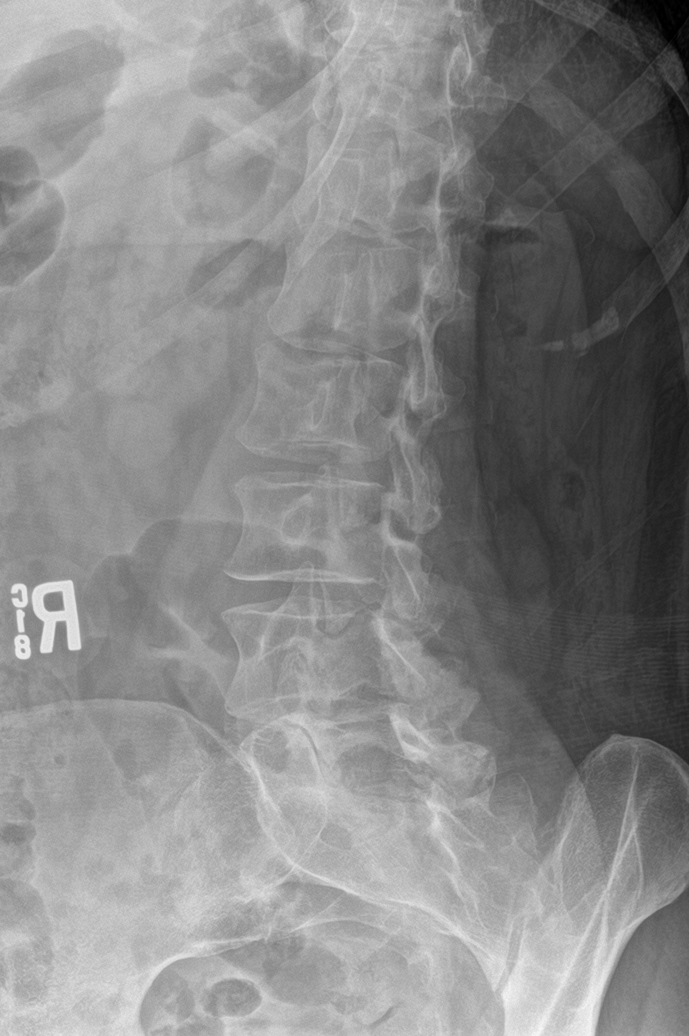
[im 4/6]
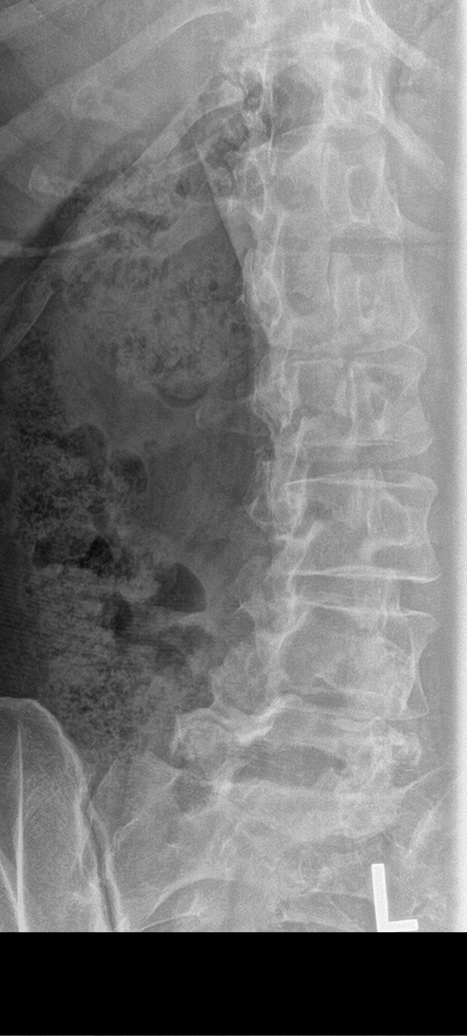
[im 5/6]
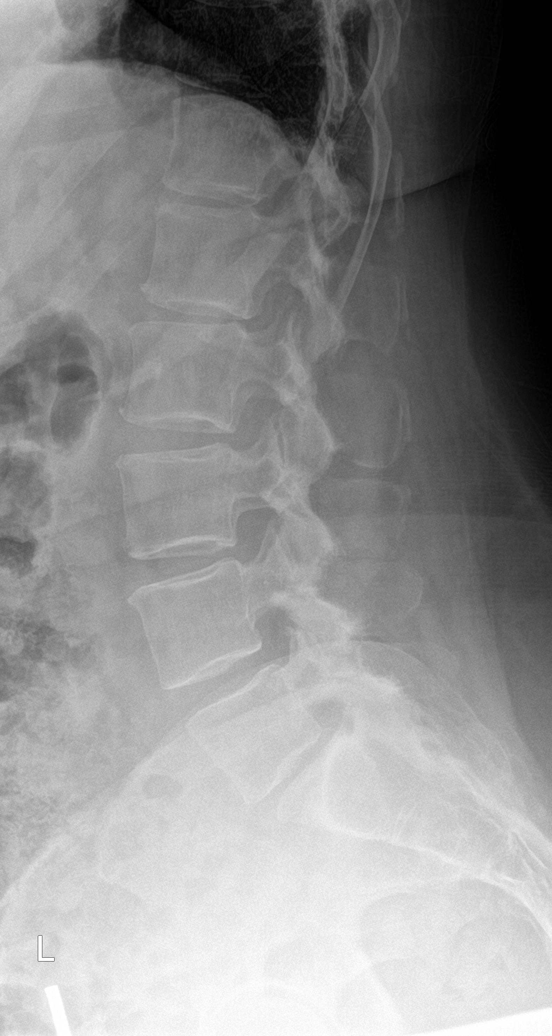
[im 6/6]
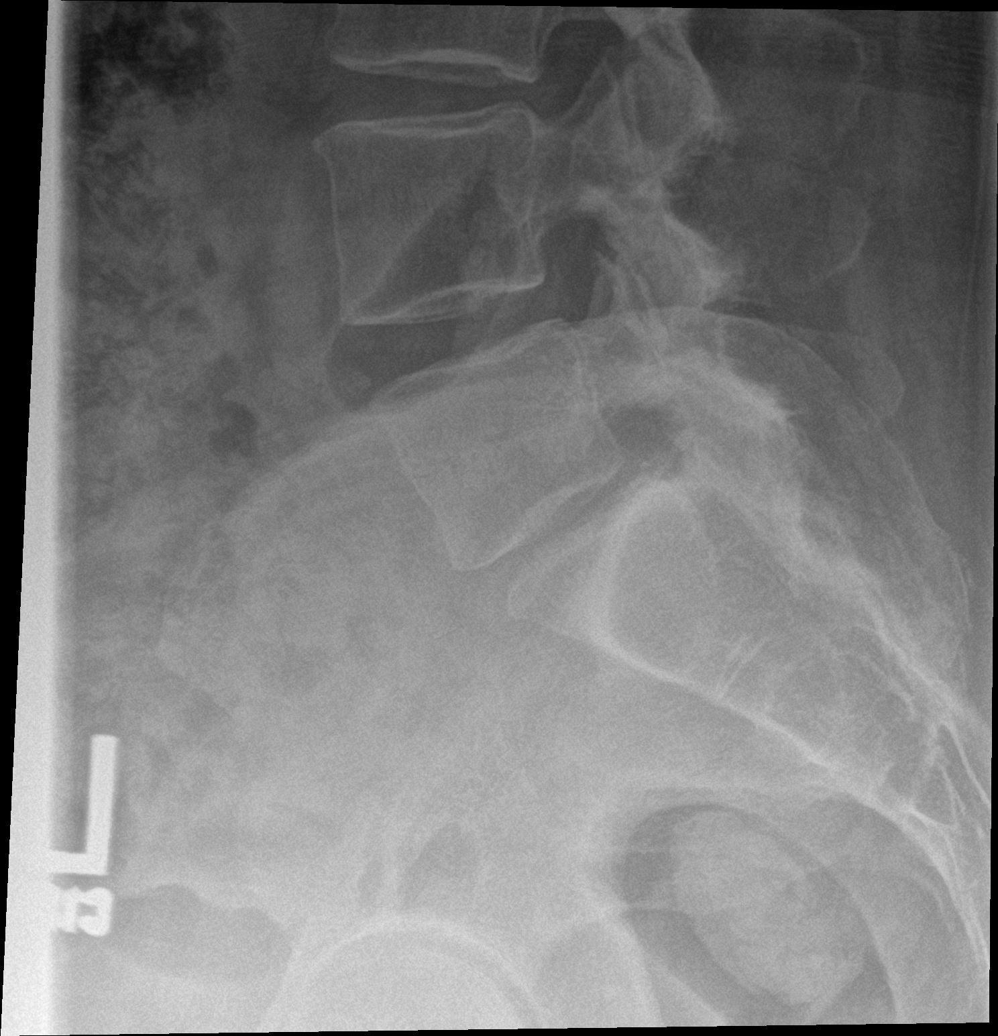

[6 of 6 positions shown; findings below may reference images not displayed]

FINDINGS: Five lumbar type vertebral bodies. No acute fracture or subluxation.
Vertebral body heights are preserved. Trace anterolisthesis at
L4-L5. 5 mm anterolisthesis at L5-S1. Mild disc height loss at
L1-L2. Moderate disc height loss at L5-S1. Moderate to severe facet
arthropathy at L4-L5 and L5-S1.
IMPRESSION: 1. Moderate degenerative disc disease and advanced facet arthropathy
at L5-S1 with grade 1 anterolisthesis.
2. Mild degenerative disc disease at L1-L2.

## 2018-12-25 NOTE — Telephone Encounter (Signed)
Pt is calling and her back is not getting any better. Pt last visit was 12/19/2018 with rachel. Pt would like to know what is the next step

## 2018-12-25 NOTE — Telephone Encounter (Signed)
Sent in referral and also an order for her to get an x-ray whenever she is able. She can go to [place] or [facility] imaging for this

## 2018-12-25 NOTE — Telephone Encounter (Signed)
Message relayed to patient. Verbalized understanding and denied questions.   

## 2018-12-25 NOTE — Telephone Encounter (Signed)
Message relayed to patient. Verbalized understanding. Pt is agreeable to which ever route you feel is best. Pt states she is still in a lot of pain and just needs relief.

## 2018-12-25 NOTE — Telephone Encounter (Signed)
We had discussed getting an x-ray and referring either to PT or Neurosurgery if not resolving, let me know what she would like to do

## 2018-12-26 MED ORDER — GABAPENTIN 300 MG PO CAPS: 300.0000 mg | ORAL_CAPSULE | Freq: Three times a day (TID) | ORAL | 0 refills | Status: DC | PRN

## 2018-12-26 NOTE — Addendum Note (Signed)
Addended by: Merrie Roof E on: 12/26/2018 09:46 AM   Modules accepted: Orders

## 2018-12-26 NOTE — Telephone Encounter (Signed)
Please let her know that I did in fact send in the referral yesterday when I said I would, I'm not sure why the representative told her that I did not.   Please also let her know that her x-ray showed some moderate arthritis which could be contributing to her pain symptoms at this time. I will leave it up to Neurosurgery on further imaging.   I will send over gabapentin for her pain in the meantime.

## 2018-12-26 NOTE — Telephone Encounter (Signed)
Message relayed to patient. Verbalized understanding and denied questions.   

## 2018-12-26 NOTE — Telephone Encounter (Signed)
Pt calling to ask if referral has been done.  Advised pt we still waitng.  Pt states she needs this done urgently.  Pt ALSO states she has only one (1)  pain pill left, which is not really working. Pt states she is unable to walk w/out severe pain. Pt states she is going to have to have something asap

## 2019-01-04 DIAGNOSIS — M5416 Radiculopathy, lumbar region: Secondary | ICD-10-CM | POA: Diagnosis not present

## 2019-01-05 ENCOUNTER — Other Ambulatory Visit (HOSPITAL_COMMUNITY): Payer: Self-pay | Admitting: Neurological Surgery

## 2019-01-05 ENCOUNTER — Other Ambulatory Visit: Payer: Self-pay | Admitting: Neurological Surgery

## 2019-01-05 DIAGNOSIS — M5416 Radiculopathy, lumbar region: Secondary | ICD-10-CM

## 2019-01-17 ENCOUNTER — Ambulatory Visit (INDEPENDENT_AMBULATORY_CARE_PROVIDER_SITE_OTHER): Payer: Medicare HMO

## 2019-01-17 DIAGNOSIS — Z0000 Encounter for general adult medical examination without abnormal findings: Secondary | ICD-10-CM

## 2019-01-17 NOTE — Patient Instructions (Signed)
Erika Anderson , Thank you for taking time to come for your Medicare Wellness Visit. I appreciate your ongoing commitment to your health goals. Please review the following plan we discussed and let me know if I can assist you in the future.   Screening recommendations/referrals: Colonoscopy: completed 2013 Mammogram: up to date Bone Density: up to date Recommended yearly ophthalmology/optometry visit for glaucoma screening and checkup Recommended yearly dental visit for hygiene and checkup  Vaccinations: Influenza vaccine: due 02/2019 Pneumococcal vaccine: up to date Tdap vaccine: up to date Shingles vaccine: shingrix eligible, check with your insurance for coverage     Advanced directives: please pick up a copy of this information next time you are in the office.   Conditions/risks identified: none  Next appointment: Follow up in one year for your annual wellness visit.    Preventive Care 1 Years and Older, Female Preventive care refers to lifestyle choices and visits with your health care provider that can promote health and wellness. What does preventive care include?  A yearly physical exam. This is also called an annual well check.  Dental exams once or twice a year.  Routine eye exams. Ask your health care provider how often you should have your eyes checked.  Personal lifestyle choices, including:  Daily care of your teeth and gums.  Regular physical activity.  Eating a healthy diet.  Avoiding tobacco and drug use.  Limiting alcohol use.  Practicing safe sex.  Taking low-dose aspirin every day.  Taking vitamin and mineral supplements as recommended by your health care provider. What happens during an annual well check? The services and screenings done by your health care provider during your annual well check will depend on your age, overall health, lifestyle risk factors, and family history of disease. Counseling  Your health care provider may ask you  questions about your:  Alcohol use.  Tobacco use.  Drug use.  Emotional well-being.  Home and relationship well-being.  Sexual activity.  Eating habits.  History of falls.  Memory and ability to understand (cognition).  Work and work Statistician.  Reproductive health. Screening  You may have the following tests or measurements:  Height, weight, and BMI.  Blood pressure.  Lipid and cholesterol levels. These may be checked every 5 years, or more frequently if you are over 43 years old.  Skin check.  Lung cancer screening. You may have this screening every year starting at age 21 if you have a 30-pack-year history of smoking and currently smoke or have quit within the past 15 years.  Fecal occult blood test (FOBT) of the stool. You may have this test every year starting at age 69.  Flexible sigmoidoscopy or colonoscopy. You may have a sigmoidoscopy every 5 years or a colonoscopy every 10 years starting at age 67.  Hepatitis C blood test.  Hepatitis B blood test.  Sexually transmitted disease (STD) testing.  Diabetes screening. This is done by checking your blood sugar (glucose) after you have not eaten for a while (fasting). You may have this done every 1-3 years.  Bone density scan. This is done to screen for osteoporosis. You may have this done starting at age 10.  Mammogram. This may be done every 1-2 years. Talk to your health care provider about how often you should have regular mammograms. Talk with your health care provider about your test results, treatment options, and if necessary, the need for more tests. Vaccines  Your health care provider may recommend certain vaccines, such as:  Influenza vaccine. This is recommended every year.  Tetanus, diphtheria, and acellular pertussis (Tdap, Td) vaccine. You may need a Td booster every 10 years.  Zoster vaccine. You may need this after age 19.  Pneumococcal 13-valent conjugate (PCV13) vaccine. One dose is  recommended after age 32.  Pneumococcal polysaccharide (PPSV23) vaccine. One dose is recommended after age 51. Talk to your health care provider about which screenings and vaccines you need and how often you need them. This information is not intended to replace advice given to you by your health care provider. Make sure you discuss any questions you have with your health care provider. Document Released: 06/20/2015 Document Revised: 02/11/2016 Document Reviewed: 03/25/2015 Elsevier Interactive Patient Education  2017 [place] Prevention in the Home Falls can cause injuries. They can happen to people of all ages. There are many things you can do to make your home safe and to help prevent falls. What can I do on the outside of my home?  Regularly fix the edges of walkways and driveways and fix any cracks.  Remove anything that might make you trip as you walk through a door, such as a raised step or threshold.  Trim any bushes or trees on the path to your home.  Use bright outdoor lighting.  Clear any walking paths of anything that might make someone trip, such as rocks or tools.  Regularly check to see if handrails are loose or broken. Make sure that both sides of any steps have handrails.  Any raised decks and porches should have guardrails on the edges.  Have any leaves, snow, or ice cleared regularly.  Use sand or salt on walking paths during winter.  Clean up any spills in your garage right away. This includes oil or grease spills. What can I do in the bathroom?  Use night lights.  Install grab bars by the toilet and in the tub and shower. Do not use towel bars as grab bars.  Use non-skid mats or decals in the tub or shower.  If you need to sit down in the shower, use a plastic, non-slip stool.  Keep the floor dry. Clean up any water that spills on the floor as soon as it happens.  Remove soap buildup in the tub or shower regularly.  Attach bath mats  securely with double-sided non-slip rug tape.  Do not have throw rugs and other things on the floor that can make you trip. What can I do in the bedroom?  Use night lights.  Make sure that you have a light by your bed that is easy to reach.  Do not use any sheets or blankets that are too big for your bed. They should not hang down onto the floor.  Have a firm chair that has side arms. You can use this for support while you get dressed.  Do not have throw rugs and other things on the floor that can make you trip. What can I do in the kitchen?  Clean up any spills right away.  Avoid walking on wet floors.  Keep items that you use a lot in easy-to-reach places.  If you need to reach something above you, use a strong step stool that has a grab bar.  Keep electrical cords out of the way.  Do not use floor polish or wax that makes floors slippery. If you must use wax, use non-skid floor wax.  Do not have throw rugs and other things on the floor that  can make you trip. What can I do with my stairs?  Do not leave any items on the stairs.  Make sure that there are handrails on both sides of the stairs and use them. Fix handrails that are broken or loose. Make sure that handrails are as long as the stairways.  Check any carpeting to make sure that it is firmly attached to the stairs. Fix any carpet that is loose or worn.  Avoid having throw rugs at the top or bottom of the stairs. If you do have throw rugs, attach them to the floor with carpet tape.  Make sure that you have a light switch at the top of the stairs and the bottom of the stairs. If you do not have them, ask someone to add them for you. What else can I do to help prevent falls?  Wear shoes that:  Do not have high heels.  Have rubber bottoms.  Are comfortable and fit you well.  Are closed at the toe. Do not wear sandals.  If you use a stepladder:  Make sure that it is fully opened. Do not climb a closed  stepladder.  Make sure that both sides of the stepladder are locked into place.  Ask someone to hold it for you, if possible.  Clearly mark and make sure that you can see:  Any grab bars or handrails.  First and last steps.  Where the edge of each step is.  Use tools that help you move around (mobility aids) if they are needed. These include:  Canes.  Walkers.  Scooters.  Crutches.  Turn on the lights when you go into a dark area. Replace any light bulbs as soon as they burn out.  Set up your furniture so you have a clear path. Avoid moving your furniture around.  If any of your floors are uneven, fix them.  If there are any pets around you, be aware of where they are.  Review your medicines with your doctor. Some medicines can make you feel dizzy. This can increase your chance of falling. Ask your doctor what other things that you can do to help prevent falls. This information is not intended to replace advice given to you by your health care provider. Make sure you discuss any questions you have with your health care provider. Document Released: 03/20/2009 Document Revised: 10/30/2015 Document Reviewed: 06/28/2014 Elsevier Interactive Patient Education  2017 [employer].

## 2019-01-17 NOTE — Progress Notes (Signed)
Subjective:   Erika Anderson is a 68 y.o. female who presents for Medicare Annual (Subsequent) preventive examination.  This visit is being conducted via phone call  - after an attmept to do on video chat - due to the COVID-19 pandemic. This patient has given me verbal consent via phone to conduct this visit, patient states they are participating from their home address. Some vital signs may be absent or patient reported.   Patient identification: identified by name, DOB, and current address.    Review of Systems:  Cardiac Risk Factors include: advanced age (>80men, >61 women)     Objective:     Vitals: There were no vitals taken for this visit.  There is no height or weight on file to calculate BMI.  Advanced Directives 01/17/2019 05/03/2018 01/12/2018 01/11/2017  Does Patient Have a Medical Advance Directive? No No No No  Would patient like information on creating a medical advance directive? - No - Patient declined Yes (MAU/Ambulatory/Procedural Areas - Information given) Yes (ED - Information included in AVS)    Tobacco Social History   Tobacco Use  Smoking Status Never Smoker  Smokeless Tobacco Never Used     Counseling given: Not Answered   Clinical Intake:  Pre-visit preparation completed: Yes  Pain : No/denies pain     Nutritional Risks: None Diabetes: No  How often do you need to have someone help you when you read instructions, pamphlets, or other written materials from your doctor or pharmacy?: 1 - Never  Interpreter Needed?: No  Information entered by :: [name],LPN  Past Medical History:  Diagnosis Date   Hyperlipidemia    Obesity (BMI 30-39.9)    Restless legs syndrome (RLS) 12/28/2014   RLS (restless legs syndrome)    Past Surgical History:  Procedure Laterality Date   ABDOMINAL HYSTERECTOMY     ovaries remain   HEMORROIDECTOMY     Family History  Problem Relation Age of Onset   Cancer Mother        ovarian   Arthritis  Sister    Heart disease Brother        MI   Stroke Paternal Grandmother    Cancer Paternal Grandfather        lung   Social History   Socioeconomic History   Marital status: Married    Spouse name: Not on file   Number of children: Not on file   Years of education: 12   Highest education level: High school graduate  Occupational History   Occupation: retired  Scientist, product/process development strain: Not hard at all   Food insecurity    Worry: Never true    Inability: Never true   Transportation needs    Medical: No    Non-medical: No  Tobacco Use   Smoking status: Never Smoker   Smokeless tobacco: Never Used  Substance and Sexual Activity   Alcohol use: No    Alcohol/week: 0.0 standard drinks   Drug use: No   Sexual activity: Never  Lifestyle   Physical activity    Days per week: 0 days    Minutes per session: 0 min   Stress: Not at all  Relationships   Social connections    Talks on phone: More than three times a week    Gets together: More than three times a week    Attends religious service: More than 4 times per year    Active member of club or organization: No  Attends meetings of clubs or organizations: Never    Relationship status: Married  Other Topics Concern   Not on file  Social History Narrative   Helps take care of grandchildren     Outpatient Encounter Medications as of 01/17/2019  Medication Sig   aspirin EC 81 MG tablet Take 81 mg by mouth daily.   rOPINIRole (REQUIP) 1 MG tablet TAKE 1 TABLET BY MOUTH 4 TIMES DAILY   albuterol (PROVENTIL HFA;VENTOLIN HFA) 108 (90 Base) MCG/ACT inhaler Inhale 2 puffs into the lungs every 6 (six) hours as needed for wheezing or shortness of breath. (Patient not taking: Reported on 01/17/2019)   fluticasone (FLONASE) 50 MCG/ACT nasal spray Place 2 sprays into both nostrils 2 (two) times daily. (Patient not taking: Reported on 01/17/2019)   Spacer/Aero-Holding Chambers (AEROCHAMBER MV)  inhaler Use as instructed (Patient not taking: Reported on 12/07/2018)   [DISCONTINUED] cyclobenzaprine (FLEXERIL) 5 MG tablet Take 1 tablet (5 mg total) by mouth 3 (three) times daily as needed for muscle spasms. Do not drive while taking this medication (Patient not taking: Reported on 01/17/2019)   [DISCONTINUED] gabapentin (NEURONTIN) 300 MG capsule Take 1 capsule (300 mg total) by mouth 3 (three) times daily as needed. (Patient not taking: Reported on 01/17/2019)   [DISCONTINUED] predniSONE (DELTASONE) 10 MG tablet Take 6 tabs day one, 5 tabs day two, 4 tabs day three, etc with breakfast (Patient not taking: Reported on 01/17/2019)   No facility-administered encounter medications on file as of 01/17/2019.     Activities of Daily Living In your present state of health, do you have any difficulty performing the following activities: 01/17/2019  Hearing? N  Vision? N  Comment wears glasses  Difficulty concentrating or making decisions? N  Walking or climbing stairs? N  Dressing or bathing? N  Doing errands, shopping? N  Preparing Food and eating ? N  Using the Toilet? N  In the past six months, have you accidently leaked urine? N  Do you have problems with loss of bowel control? N  Managing your Medications? N  Managing your Finances? N  Housekeeping or managing your Housekeeping? N  Some recent data might be hidden    Patient Care Team: Volney American, PA-C as PCP - General (Family Medicine) Rico Junker, RN as Registered Nurse Theodore Demark, RN as Registered Nurse    Assessment:   This is a routine wellness examination for Erika Anderson.  Exercise Activities and Dietary recommendations Current Exercise Habits: The patient does not participate in regular exercise at present(stays active at home), Exercise limited by: None identified  Goals     DIET - INCREASE WATER INTAKE     Recommend drinking at least 6-8 glasses of water a day        Fall Risk: Fall Risk   05/10/2018 01/12/2018 01/11/2017 12/30/2015  Falls in the past year? 0 No No No  Follow up Falls evaluation completed - - -    FALL RISK PREVENTION PERTAINING TO THE HOME:  Any stairs in or around the home? yes If so, are there any without handrails? No   Home free of loose throw rugs in walkways, pet beds, electrical cords, etc? Yes  Adequate lighting in your home to reduce risk of falls? Yes   ASSISTIVE DEVICES UTILIZED TO PREVENT FALLS:  Life alert? No  Use of a cane, walker or w/c? No  Grab bars in the bathroom? No  Shower chair or bench in shower? No  Elevated toilet seat  or a handicapped toilet? Yes   DME ORDERS:  DME order needed?  No   TIMED UP AND GO:  Unable to perform    Depression Screen PHQ 2/9 Scores 01/17/2019 01/12/2018 01/11/2017 12/30/2015  PHQ - 2 Score 0 0 0 0  PHQ- 9 Score - - 2 -     Cognitive Function     6CIT Screen 01/12/2018  What Year? 0 points  What month? 0 points  What time? 0 points  Count back from 20 0 points  Months in reverse 0 points  Repeat phrase 2 points  Total Score 2    Immunization History  Administered Date(s) Administered   Influenza,inj,Quad PF,6+ Mos 03/09/2016   Pneumococcal Conjugate-13 01/11/2017   Pneumococcal Polysaccharide-23 01/12/2018   Tdap 12/30/2015    Qualifies for Shingles Vaccine? Yes  Zostavax completed n/a. Due for Shingrix. Education has been provided regarding the importance of this vaccine. Pt has been advised to call insurance company to determine out of pocket expense. Advised may also receive vaccine at local pharmacy or Health Dept. Verbalized acceptance and understanding.  Tdap: up to date .  Flu Vaccine: Due 02/2019  Pneumococcal Vaccine: up to date   Screening Tests Health Maintenance  Topic Date Due   INFLUENZA VACCINE  01/06/2019   MAMMOGRAM  06/16/2019   COLONOSCOPY  06/07/2021   TETANUS/TDAP  12/29/2025   DEXA SCAN  Completed   Hepatitis C Screening  Completed   PNA vac  Low Risk Adult  Completed    Cancer Screenings:  Colorectal Screening: Completed 06/08/2011. Repeat every 10 years  Mammogram: Completed 06/15/2018. Repeat every year  Bone Density: Completed 03/29/2017.   Lung Cancer Screening: (Low Dose CT Chest recommended if Age 78-80 years, 30 pack-year currently smoking OR have quit w/in 15years.) does not qualify.    Additional Screening:  Hepatitis C Screening: does qualify; Completed 01/11/2017  Vision Screening: Recommended annual ophthalmology exams for early detection of glaucoma and other disorders of the eye. Is the patient up to date with their annual eye exam?  Yes  Who is the provider or what is the name of the office in which the pt attends annual eye exams? [facility] in [place]   Dental Screening: Recommended annual dental exams for proper oral hygiene  Community Resource Referral:  CRR required this visit?  No       Plan:  I have personally reviewed and addressed the Medicare Annual Wellness questionnaire and have noted the following in the patients chart:  A. Medical and social history B. Use of alcohol, tobacco or illicit drugs  C. Current medications and supplements D. Functional ability and status E.  Nutritional status F.  Physical activity G. Advance directives H. List of other physicians I.  Hospitalizations, surgeries, and ER visits in previous 12 months J.  [place] such as hearing and vision if needed, cognitive and depression L. Referrals and appointments   In addition, I have reviewed and discussed with patient certain preventive protocols, quality metrics, and best practice recommendations. A written personalized care plan for preventive services as well as general preventive health recommendations were provided to patient.  Signed,    Bevelyn Ngo, LPN  [id number] Nurse Health Advisor   Nurse Notes: none

## 2019-01-18 ENCOUNTER — Other Ambulatory Visit: Payer: Self-pay

## 2019-01-18 ENCOUNTER — Ambulatory Visit
Admission: RE | Admit: 2019-01-18 | Discharge: 2019-01-18 | Disposition: A | Discharge status: Home / Self Care | Payer: Medicare HMO | Source: Ambulatory Visit | Attending: Neurological Surgery | Admitting: Neurological Surgery

## 2019-01-18 DIAGNOSIS — M5416 Radiculopathy, lumbar region: Secondary | ICD-10-CM

## 2019-01-18 DIAGNOSIS — M4317 Spondylolisthesis, lumbosacral region: Secondary | ICD-10-CM | POA: Diagnosis not present

## 2019-01-18 DIAGNOSIS — M5117 Intervertebral disc disorders with radiculopathy, lumbosacral region: Secondary | ICD-10-CM | POA: Diagnosis not present

## 2019-01-18 DIAGNOSIS — M4727 Other spondylosis with radiculopathy, lumbosacral region: Secondary | ICD-10-CM | POA: Diagnosis not present

## 2019-01-18 IMAGING — MR MRI LUMBAR SPINE WITHOUT CONTRAST
5 series · 31 of 48 positions shown · non-contrast
Comparison: None available

CLINICAL DATA: Lumbar radiculopathy.  Left leg pain

EXAM:
MRI LUMBAR SPINE WITHOUT CONTRAST
TECHNIQUE: Multiplanar, multisequence MR imaging of the lumbar spine was
performed. No intravenous contrast was administered.

[Series 5: T2 · sagittal · 4.0mm · 0.81mm/px · 6 of 15 slices shown (1 of 2)]
[im 1/15]
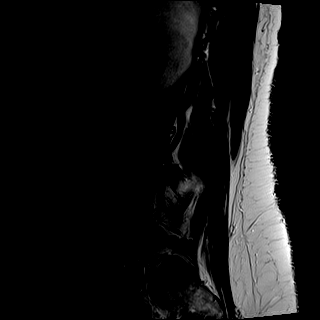
[im 3/15]
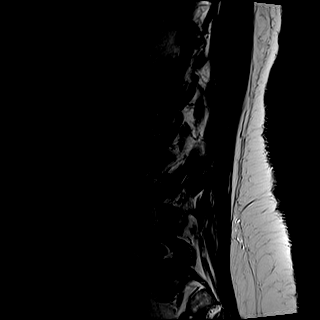
[im 6/15]
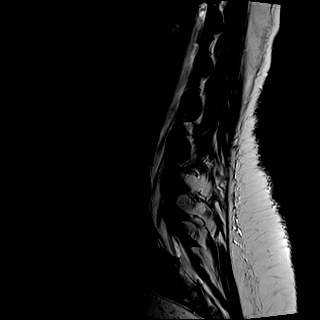
[im 9/15]
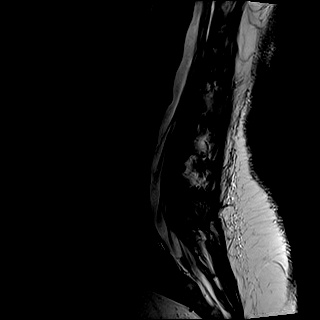
[im 12/15]
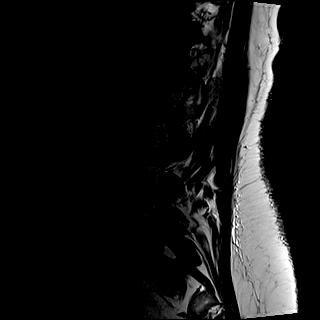
[im 15/15]
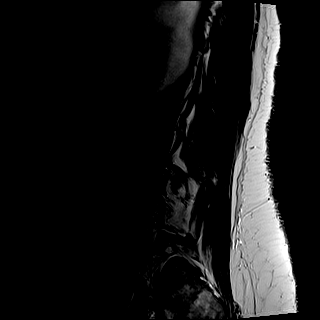

[Series 6: T1 · sagittal · 4.0mm · 0.81mm/px · 7 of 15 slices shown (1 of 2)]
[im 1/15]
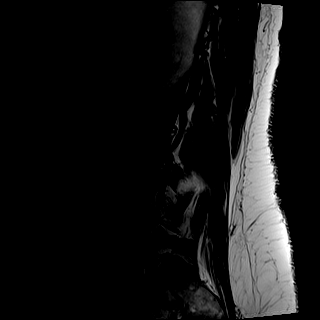
[im 3/15]
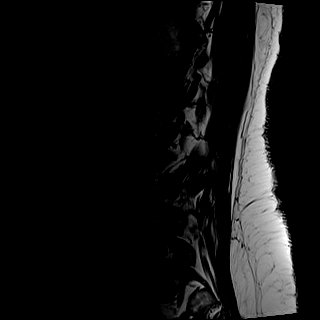
[im 5/15]
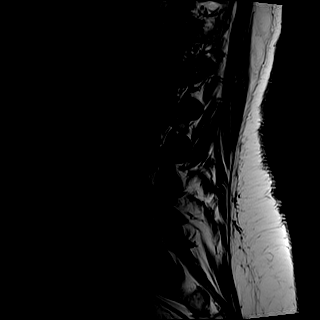
[im 8/15]
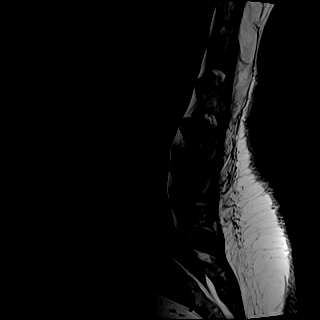
[im 10/15]
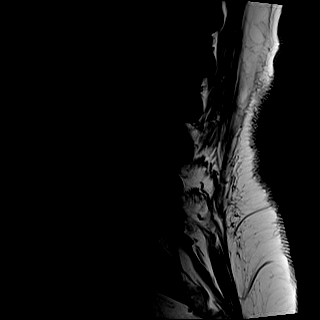
[im 12/15]
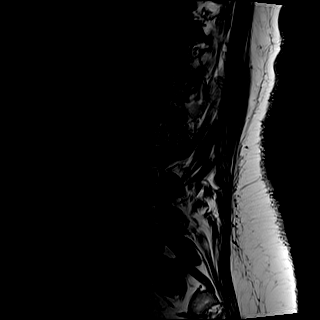
[im 15/15]
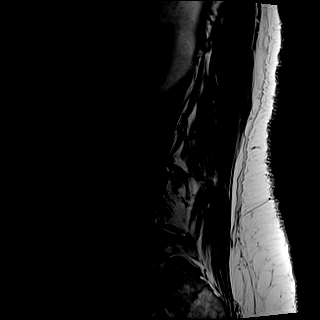

[Series 7: STIR · sagittal · 4.0mm · 0.41mm/px · 2 of 15 slices shown]
[im 1/15]
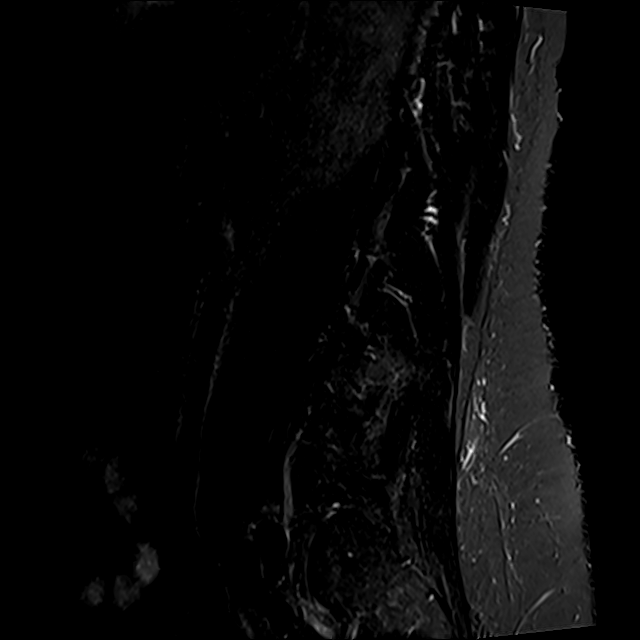
[im 3/15]
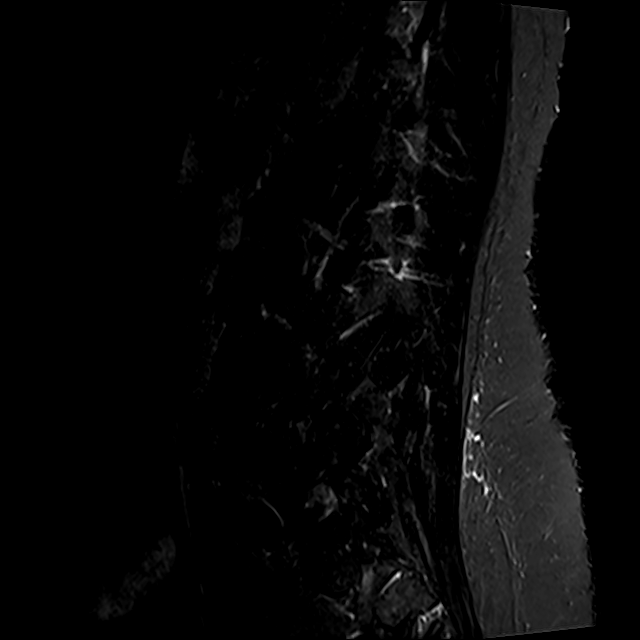

[Series 8: T2 · axial · 4.0mm · 0.78mm/px · z∈[-64,+126]mm · 8 of 29 slices shown (2 of 2)]
[im 1/29]
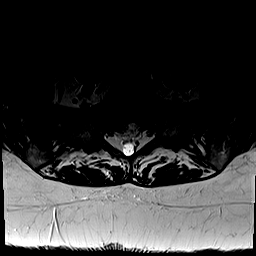
[im 5/29]
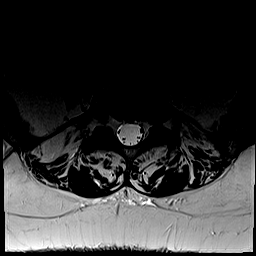
[im 9/29]
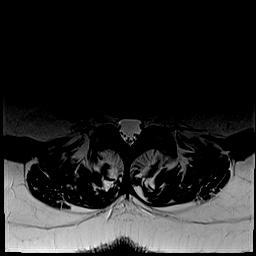
[im 13/29]
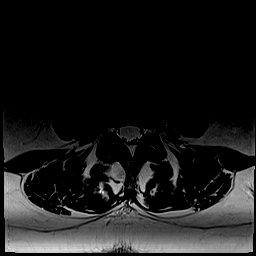
[im 16/29]
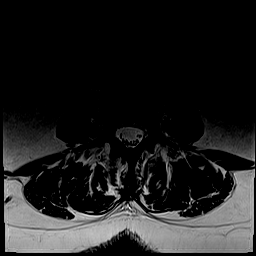
[im 20/29]
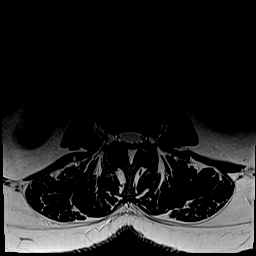
[im 24/29]
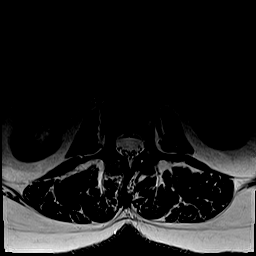
[im 29/29]
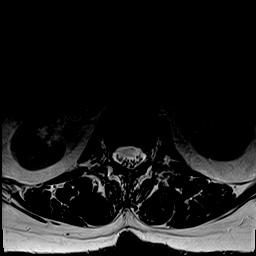

[Series 9: T1 · axial · 4.0mm · 0.39mm/px · z∈[-64,+126]mm · 8 of 29 slices shown (2 of 2)]
[im 1/29]
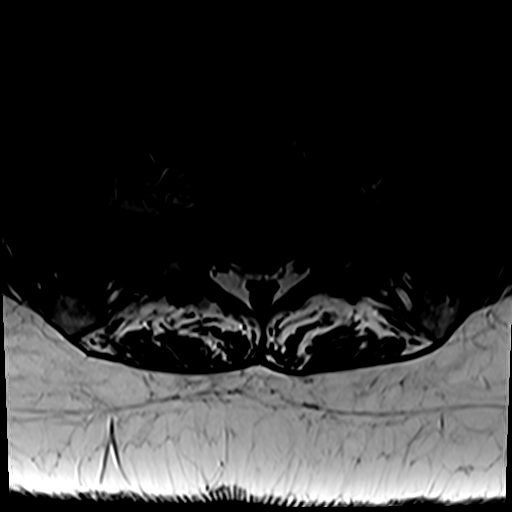
[im 5/29]
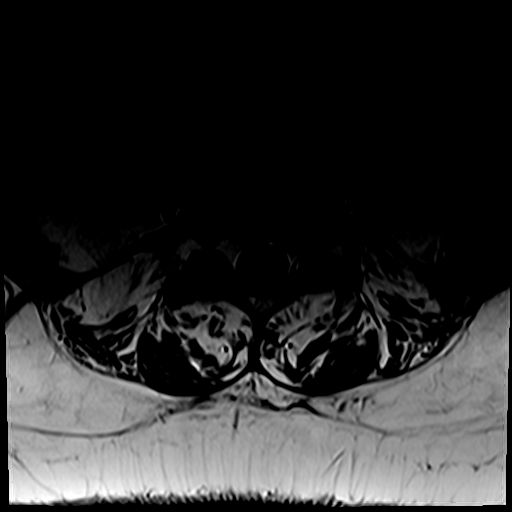
[im 9/29]
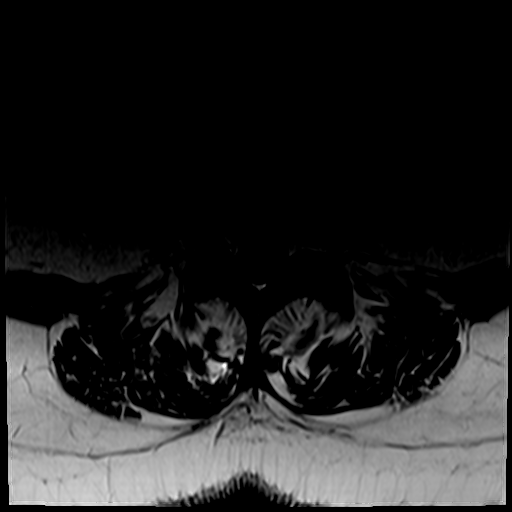
[im 13/29]
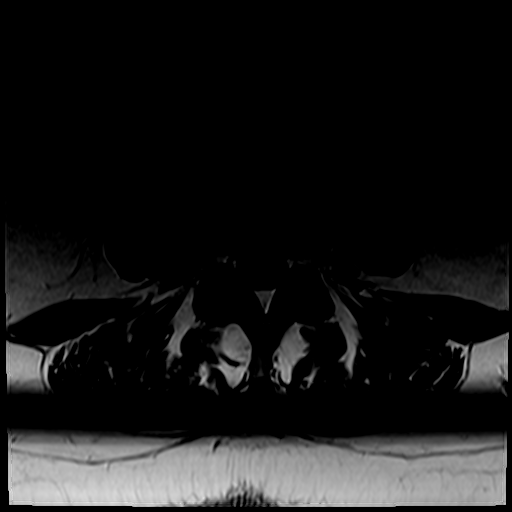
[im 16/29]
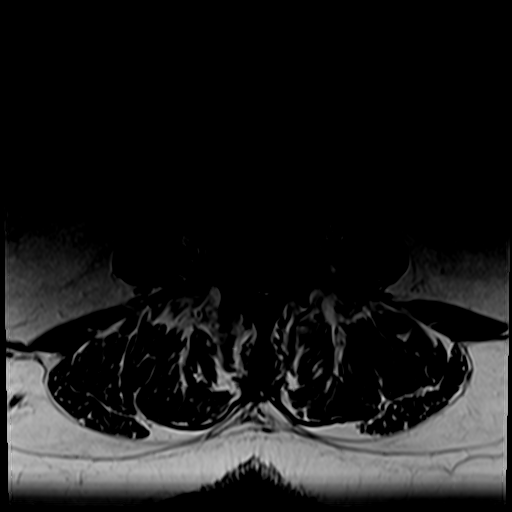
[im 20/29]
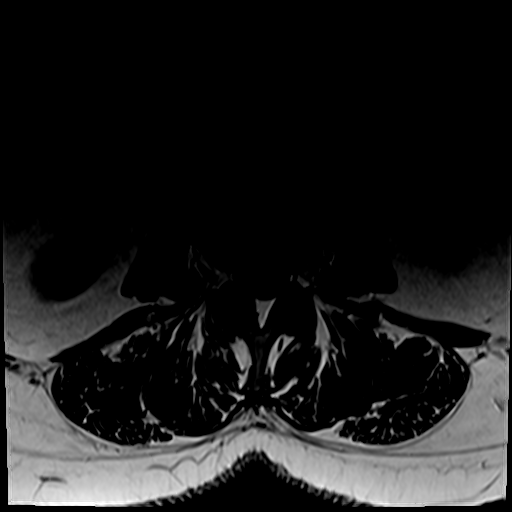
[im 24/29]
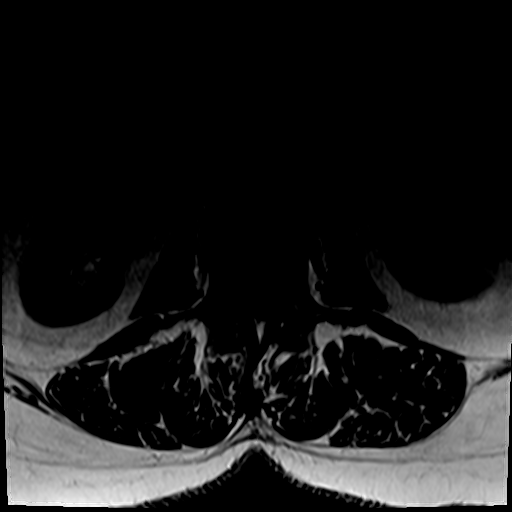
[im 29/29]
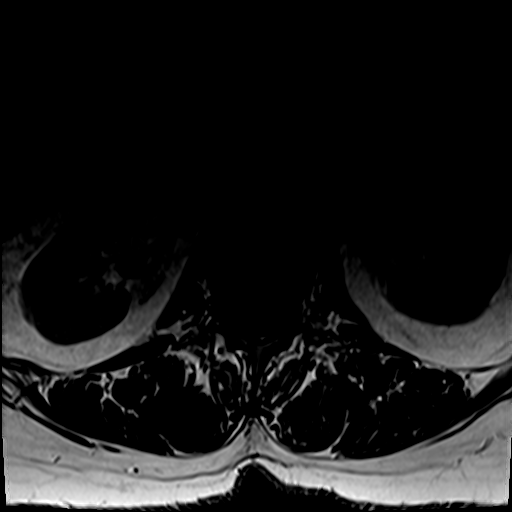

[31 of 48 positions shown; findings below may reference images not displayed]

FINDINGS: Segmentation:  Standard lumbar numbering

Alignment: Grade 1 anterolisthesis at L5-S1 associated with facet
degeneration

Vertebrae: No fracture, evidence of discitis, or aggressive bone
lesion.

Conus medullaris and cauda equina: Conus extends to the L1 level.
Conus and cauda equina appear normal.

Paraspinal and other soft tissues: Negative

Disc levels:

T12- L1: Unremarkable.

L1-L2: Disc desiccation and narrowing with small central protrusion
on sagittal images. No impingement

L2-L3: Mild disc narrowing and facet spurring.  No impingement

L3-L4: Mild disc narrowing and minimal bulging. Minimal facet
spurring. No impingement

L4-L5: Degenerative facet spurring and minor disc narrowing. No
impingement

L5-S1:Degenerative facet spurring with mild anterolisthesis.
Narrowing of the disc with a left foraminal extrusion compressing
the left L5 nerve root.
IMPRESSION: 1. L5-S1 left foraminal extrusion with L5 impingement. There is
background facet degeneration with anterolisthesis at this level.
2. Noncompressive degenerative changes at the other levels as
described above.

## 2019-01-19 ENCOUNTER — Encounter: Payer: Medicare HMO | Admitting: Nurse Practitioner

## 2019-01-19 ENCOUNTER — Other Ambulatory Visit: Payer: Self-pay

## 2019-01-19 ENCOUNTER — Ambulatory Visit (INDEPENDENT_AMBULATORY_CARE_PROVIDER_SITE_OTHER): Payer: Medicare HMO | Admitting: Nurse Practitioner

## 2019-01-19 ENCOUNTER — Encounter: Payer: Self-pay | Admitting: Nurse Practitioner

## 2019-01-19 VITALS — BP 118/73 | HR 80 | Temp 98.4°F | Ht 65.0 in | Wt 246.0 lb

## 2019-01-19 DIAGNOSIS — Z0000 Encounter for general adult medical examination without abnormal findings: Secondary | ICD-10-CM | POA: Diagnosis not present

## 2019-01-19 DIAGNOSIS — G2581 Restless legs syndrome: Secondary | ICD-10-CM

## 2019-01-19 DIAGNOSIS — E7800 Pure hypercholesterolemia, unspecified: Secondary | ICD-10-CM

## 2019-01-19 MED ORDER — SHINGRIX 50 MCG/0.5ML IM SUSR: 0.5000 mL | Freq: Once | INTRAMUSCULAR | 0 refills | Status: AC

## 2019-01-19 NOTE — Progress Notes (Signed)
BP 118/73   Pulse 80   Temp 98.4 F (36.9 C) (Oral)   Ht 5\' 5"  (1.651 m)   Wt 246 lb (111.6 kg)   SpO2 97%   BMI 40.94 kg/m    Subjective:    Patient ID: Erika Anderson, female    DOB: 01-Oct-1950, 68 y.o.   MRN: [id number]  HPI: Erika Anderson is a 68 y.o. female presenting on 01/19/2019 for comprehensive medical examination. Current medical complaints include:none  She currently lives with: husband Menopausal Symptoms: no   HYPERLIPIDEMIA Currently not taking medication and is diet focused. Hyperlipidemia status: good compliance Satisfied with current treatment?  yes Side effects:  no Medication compliance: good compliance Supplements: none Aspirin:  no The 10-year ASCVD risk score [name] DC Jr., et al., 2013) is: 6.2%   Values used to calculate the score:     Age: 89 years     Sex: Female     Is Non-Hispanic African American: No     Diabetic: No     Tobacco smoker: No     Systolic Blood Pressure: 597 mmHg     Is BP treated: No     HDL Cholesterol: 54 mg/dL     Total Cholesterol: 219 mg/dL Chest pain:  no Coronary artery disease:  no Family history CAD:  no Family history early CAD:  no  Depression Screen done today and results listed below:  Depression screen [facility] 2/9 01/19/2019 01/17/2019 01/12/2018 01/11/2017 12/30/2015  Decreased Interest 0 0 0 0 0  Down, Depressed, Hopeless 0 0 0 0 0  PHQ - 2 Score 0 0 0 0 0  Altered sleeping 0 - - 1 -  Tired, decreased energy 0 - - 0 -  Change in appetite 0 - - 1 -  Feeling bad or failure about yourself  0 - - 0 -  Trouble concentrating 0 - - 0 -  Moving slowly or fidgety/restless 0 - - 0 -  Suicidal thoughts 0 - - 0 -  PHQ-9 Score 0 - - 2 -  Difficult doing work/chores Not difficult at all - - - -    The patient does not have a history of falls. I did not complete a risk assessment for falls. A plan of care for falls was not documented.   Past Medical History:  Past Medical History:  Diagnosis Date  .  Hyperlipidemia   . Obesity (BMI 30-39.9)   . Restless legs syndrome (RLS) 12/28/2014  . RLS (restless legs syndrome)     Surgical History:  Past Surgical History:  Procedure Laterality Date  . ABDOMINAL HYSTERECTOMY     ovaries remain  . HEMORROIDECTOMY      Medications:  Current Outpatient Medications on File Prior to Visit  Medication Sig  . aspirin EC 81 MG tablet Take 81 mg by mouth daily.  Marland Kitchen rOPINIRole (REQUIP) 1 MG tablet TAKE 1 TABLET BY MOUTH 4 TIMES DAILY  . albuterol (PROVENTIL HFA;VENTOLIN HFA) 108 (90 Base) MCG/ACT inhaler Inhale 2 puffs into the lungs every 6 (six) hours as needed for wheezing or shortness of breath. (Patient not taking: Reported on 01/17/2019)  . fluticasone (FLONASE) 50 MCG/ACT nasal spray Place 2 sprays into both nostrils 2 (two) times daily. (Patient not taking: Reported on 01/17/2019)  . Spacer/Aero-Holding Chambers (AEROCHAMBER MV) inhaler Use as instructed (Patient not taking: Reported on 12/07/2018)   No current facility-administered medications on file prior to visit.     Allergies:  No Known Allergies  Social History:  Social History   Socioeconomic History  . Marital status: Married    Spouse name: Not on file  . Number of children: Not on file  . Years of education: 58  . Highest education level: High school graduate  Occupational History  . Occupation: retired  Scientific laboratory technician  . Financial resource strain: Not hard at all  . Food insecurity    Worry: Never true    Inability: Never true  . Transportation needs    Medical: No    Non-medical: No  Tobacco Use  . Smoking status: Never Smoker  . Smokeless tobacco: Never Used  Substance and Sexual Activity  . Alcohol use: No    Alcohol/week: 0.0 standard drinks  . Drug use: No  . Sexual activity: Never  Lifestyle  . Physical activity    Days per week: 0 days    Minutes per session: 0 min  . Stress: Not at all  Relationships  . Social connections    Talks on phone: More than  three times a week    Gets together: More than three times a week    Attends religious service: More than 4 times per year    Active member of club or organization: No    Attends meetings of clubs or organizations: Never    Relationship status: Married  . Intimate partner violence    Fear of current or ex partner: No    Emotionally abused: No    Physically abused: No    Forced sexual activity: No  Other Topics Concern  . Not on file  Social History Narrative   Helps take care of grandchildren    Social History   Tobacco Use  Smoking Status Never Smoker  Smokeless Tobacco Never Used   Social History   Substance and Sexual Activity  Alcohol Use No  . Alcohol/week: 0.0 standard drinks    Family History:  Family History  Problem Relation Age of Onset  . Cancer Mother        ovarian  . Arthritis Sister   . Heart disease Brother        MI  . Stroke Paternal Grandmother   . Cancer Paternal Grandfather        lung    Past medical history, surgical history, medications, allergies, family history and social history reviewed with patient today and changes made to appropriate areas of the chart.   Review of Systems - negative All other ROS negative except what is listed above and in the HPI.      Objective:    BP 118/73   Pulse 80   Temp 98.4 F (36.9 C) (Oral)   Ht 5\' 5"  (1.651 m)   Wt 246 lb (111.6 kg)   SpO2 97%   BMI 40.94 kg/m   Wt Readings from Last 3 Encounters:  01/19/19 246 lb (111.6 kg)  05/10/18 240 lb (108.9 kg)  05/03/18 233 lb (105.7 kg)    Physical Exam Vitals signs and nursing note reviewed.  Constitutional:      General: She is awake. She is not in acute distress.    Appearance: She is well-developed. She is obese. She is not ill-appearing.  HENT:     Head: Normocephalic.     Right Ear: Hearing, tympanic membrane, ear canal and external ear normal.     Left Ear: Hearing, tympanic membrane, ear canal and external ear normal.     Nose: Nose  normal.     Mouth/Throat:  Mouth: Mucous membranes are moist.     Pharynx: Oropharynx is clear. Uvula midline.  Eyes:     General: Lids are normal.        Right eye: No discharge.        Left eye: No discharge.     Extraocular Movements: Extraocular movements intact.     Conjunctiva/sclera: Conjunctivae normal.     Pupils: Pupils are equal, round, and reactive to light.     Visual Fields: Right eye visual fields normal and left eye visual fields normal.  Neck:     Musculoskeletal: Normal range of motion and neck supple.     Thyroid: No thyromegaly.     Vascular: No carotid bruit.  Cardiovascular:     Rate and Rhythm: Normal rate and regular rhythm.     Heart sounds: Normal heart sounds. No murmur. No gallop.   Pulmonary:     Effort: Pulmonary effort is normal. No accessory muscle usage or respiratory distress.     Breath sounds: Normal breath sounds.  Chest:     Breasts:        Right: Normal. No swelling, bleeding, inverted nipple, mass, nipple discharge, skin change or tenderness.        Left: Normal. No swelling, bleeding, inverted nipple, mass, nipple discharge, skin change or tenderness.  Abdominal:     General: Bowel sounds are normal. There is no distension.     Palpations: Abdomen is soft. There is no hepatomegaly or splenomegaly.     Tenderness: There is no abdominal tenderness.     Hernia: No hernia is present.  Musculoskeletal:     Right lower leg: No edema.     Left lower leg: No edema.  Lymphadenopathy:     Cervical: No cervical adenopathy.     Upper Body:     Right upper body: No supraclavicular, axillary or pectoral adenopathy.     Left upper body: No supraclavicular, axillary or pectoral adenopathy.  Skin:    General: Skin is warm and dry.  Neurological:     Mental Status: She is alert and oriented to person, place, and time.     Cranial Nerves: Cranial nerves are intact.     Gait: Gait is intact.     Deep Tendon Reflexes: Reflexes are normal and  symmetric.     Reflex Scores:      Brachioradialis reflexes are 2+ on the right side and 2+ on the left side.      Patellar reflexes are 2+ on the right side and 2+ on the left side. Psychiatric:        Attention and Perception: Attention normal.        Mood and Affect: Mood normal.        Speech: Speech normal.        Behavior: Behavior normal. Behavior is cooperative.        Thought Content: Thought content normal.        Cognition and Memory: Cognition normal.        Judgment: Judgment normal.     Results for orders placed or performed during the hospital encounter of 05/03/18  CBC with Differential/Platelet  Result Value Ref Range   WBC 8.8 4.0 - 10.5 K/uL   RBC 4.45 3.87 - 5.11 MIL/uL   Hemoglobin 13.4 12.0 - 15.0 g/dL   HCT 40.1 36.0 - 46.0 %   MCV 90.1 80.0 - 100.0 fL   MCH 30.1 26.0 - 34.0 pg   MCHC 33.4 30.0 -  36.0 g/dL   RDW 13.0 11.5 - 15.5 %   Platelets 263 150 - 400 K/uL   nRBC 0.0 0.0 - 0.2 %   Neutrophils Relative % 59 %   Neutro Abs 5.2 1.7 - 7.7 K/uL   Lymphocytes Relative 32 %   Lymphs Abs 2.8 0.7 - 4.0 K/uL   Monocytes Relative 7 %   Monocytes Absolute 0.6 0.1 - 1.0 K/uL   Eosinophils Relative 1 %   Eosinophils Absolute 0.1 0.0 - 0.5 K/uL   Basophils Relative 1 %   Basophils Absolute 0.1 0.0 - 0.1 K/uL   Immature Granulocytes 0 %   Abs Immature Granulocytes 0.02 0.00 - 0.07 K/uL  Comprehensive metabolic panel  Result Value Ref Range   Sodium 139 135 - 145 mmol/L   Potassium 4.1 3.5 - 5.1 mmol/L   Chloride 102 98 - 111 mmol/L   CO2 28 22 - 32 mmol/L   Glucose, Bld 105 (H) 70 - 99 mg/dL   BUN 18 8 - 23 mg/dL   Creatinine, Ser 0.72 0.44 - 1.00 mg/dL   Calcium 9.5 8.9 - 10.3 mg/dL   Total Protein 7.3 6.5 - 8.1 g/dL   Albumin 4.3 3.5 - 5.0 g/dL   AST 74 (H) 15 - 41 U/L   ALT 96 (H) 0 - 44 U/L   Alkaline Phosphatase 85 38 - 126 U/L   Total Bilirubin 0.4 0.3 - 1.2 mg/dL   GFR calc non Af Amer >60 >60 mL/min   GFR calc Af Amer >60 >60 mL/min    Anion gap 9 5 - 15      Assessment & Plan:   Problem List Items Addressed This Visit      Other   Restless legs syndrome (RLS)    Chronic and stable with Requip.  Continue current regimen.      Hypercholesteremia    No current medications.  Recheck lipid panel today.      Relevant Orders   Comprehensive metabolic panel   Lipid Panel w/o Chol/HDL Ratio    Other Visit Diagnoses    Encounter for annual physical exam    -  Primary   Relevant Orders   CBC with Differential/Platelet   Comprehensive metabolic panel   Lipid Panel w/o Chol/HDL Ratio   TSH       Follow up plan: Return in about 1 year (around 01/19/2020) for Annual Physical.   LABORATORY TESTING:  - Pap smear: not applicable  IMMUNIZATIONS:   - Tdap: Tetanus vaccination status reviewed: last tetanus booster within 10 years. - Influenza: Up to date - Pneumovax: Up to date - Prevnar: Up to date - HPV: Not applicable - Zostavax vaccine: order today  SCREENING: -Mammogram: Up to date  - Colonoscopy: Up to date  - Bone Density: Not applicable  -Hearing Test: Not applicable  -Spirometry: Not applicable   PATIENT COUNSELING:   Advised to take 1 mg of folate supplement per day if capable of pregnancy.   Sexuality: Discussed sexually transmitted diseases, partner selection, use of condoms, avoidance of unintended pregnancy  and contraceptive alternatives.   Advised to avoid cigarette smoking.  I discussed with the patient that most people either abstain from alcohol or drink within safe limits (<=14/week and <=4 drinks/occasion for males, <=7/weeks and <= 3 drinks/occasion for females) and that the risk for alcohol disorders and other health effects rises proportionally with the number of drinks per week and how often a drinker exceeds daily limits.  Discussed cessation/primary  prevention of drug use and availability of treatment for abuse.   Diet: Encouraged to adjust caloric intake to maintain  or achieve  ideal body weight, to reduce intake of dietary saturated fat and total fat, to limit sodium intake by avoiding high sodium foods and not adding table salt, and to maintain adequate dietary potassium and calcium preferably from fresh fruits, vegetables, and low-fat dairy products.    stressed the importance of regular exercise  Injury prevention: Discussed safety belts, safety helmets, smoke detector, smoking near bedding or upholstery.   Dental health: Discussed importance of regular tooth brushing, flossing, and dental visits.    NEXT PREVENTATIVE PHYSICAL DUE IN 1 YEAR. Return in about 1 year (around 01/19/2020) for Annual Physical.

## 2019-01-19 NOTE — Patient Instructions (Signed)
Radicular Pain Radicular pain is a type of pain that spreads from your back or neck along a spinal nerve. Spinal nerves are nerves that leave the spinal cord and go to the muscles. Radicular pain is sometimes called radiculopathy, radiculitis, or a pinched nerve. When you have this type of pain, you may also have weakness, numbness, or tingling in the area of your body that is supplied by the nerve. The pain may feel sharp and burning. Depending on which spinal nerve is affected, the pain may occur in the:  Neck area (cervical radicular pain). You may also feel pain, numbness, weakness, or tingling in the arms.  Mid-spine area (thoracic radicular pain). You would feel this pain in the back and chest. This type is rare.  Lower back area (lumbar radicular pain). You would feel this pain as low back pain. You may feel pain, numbness, weakness, or tingling in the buttocks or legs. Sciatica is a type of lumbar radicular pain that shoots down the back of the leg. Radicular pain occurs when one of the spinal nerves becomes irritated or squeezed (compressed). It is often caused by something pushing on a spinal nerve, such as one of the bones of the spine (vertebrae) or one of the round cushions between vertebrae (intervertebral disks). This can result from:  An injury.  Wear and tear or aging of a disk.  The growth of a bone spur that pushes on the nerve. Radicular pain often goes away when you follow instructions from your health care provider for relieving pain at home. Follow these instructions at home: Managing pain      If directed, put ice on the affected area: ? Put ice in a plastic bag. ? Place a towel between your skin and the bag. ? Leave the ice on for 20 minutes, 2-3 times a day.  If directed, apply heat to the affected area as often as told by your health care provider. Use the heat source that your health care provider recommends, such as a moist heat pack or a heating pad. ? Place  a towel between your skin and the heat source. ? Leave the heat on for 20-30 minutes. ? Remove the heat if your skin turns bright red. This is especially important if you are unable to feel pain, heat, or cold. You may have a greater risk of getting burned. Activity   Do not sit or rest in bed for long periods of time.  Try to stay as active as possible. Ask your health care provider what type of exercise or activity is best for you.  Avoid activities that make your pain worse, such as bending and lifting.  Do not lift anything that is heavier than 10 lb (4.5 kg), or the limit that you are told, until your health care provider says that it is safe.  Practice using proper technique when lifting items. Proper lifting technique involves bending your knees and rising up.  Do strength and range-of-motion exercises only as told by your health care provider or physical therapist. General instructions  Take over-the-counter and prescription medicines only as told by your health care provider.  Pay attention to any changes in your symptoms.  Keep all follow-up visits as told by your health care provider. This is important. ? Your health care provider may send you to a physical therapist to help with this pain. Contact a health care provider if:  Your pain and other symptoms get worse.  Your pain medicine is not   helping.  Your pain has not improved after a few weeks of home care.  You have a fever. Get help right away if:  You have severe pain, weakness, or numbness.  You have difficulty with bladder or bowel control. Summary  Radicular pain is a type of pain that spreads from your back or neck along a spinal nerve.  When you have radicular pain, you may also have weakness, numbness, or tingling in the area of your body that is supplied by the nerve.  The pain may feel sharp or burning.  Radicular pain may be treated with ice, heat, medicines, or physical therapy. This  information is not intended to replace advice given to you by your health care provider. Make sure you discuss any questions you have with your health care provider. Document Released: 07/01/2004 Document Revised: 12/06/2017 Document Reviewed: 12/06/2017 Elsevier Patient Education  2020 [employer].

## 2019-01-19 NOTE — Assessment & Plan Note (Signed)
Chronic and stable with Requip.  Continue current regimen.

## 2019-01-19 NOTE — Assessment & Plan Note (Signed)
No current medications.  Recheck lipid panel today.

## 2019-01-20 LAB — COMPREHENSIVE METABOLIC PANEL
ALT: 37 IU/L — ABNORMAL HIGH (ref 0–32)
AST: 26 IU/L (ref 0–40)
Albumin/Globulin Ratio: 2 (ref 1.2–2.2)
Albumin: 4.5 g/dL (ref 3.8–4.8)
Alkaline Phosphatase: 93 IU/L (ref 39–117)
BUN/Creatinine Ratio: 25 (ref 12–28)
BUN: 20 mg/dL (ref 8–27)
Bilirubin Total: 0.3 mg/dL (ref 0.0–1.2)
CO2: 25 mmol/L (ref 20–29)
Calcium: 9.7 mg/dL (ref 8.7–10.3)
Chloride: 104 mmol/L (ref 96–106)
Creatinine, Ser: 0.8 mg/dL (ref 0.57–1.00)
GFR calc Af Amer: 88 mL/min/{1.73_m2} (ref >59)
GFR calc non Af Amer: 77 mL/min/{1.73_m2} (ref >59)
Globulin, Total: 2.2 g/dL (ref 1.5–4.5)
Glucose: 93 mg/dL (ref 65–99)
Potassium: 4.3 mmol/L (ref 3.5–5.2)
Sodium: 143 mmol/L (ref 134–144)
Total Protein: 6.7 g/dL (ref 6.0–8.5)

## 2019-01-20 LAB — CBC WITH DIFFERENTIAL/PLATELET
Basophils Absolute: 0.1 10*3/uL (ref 0.0–0.2)
Basos: 1 %
EOS (ABSOLUTE): 0.1 10*3/uL (ref 0.0–0.4)
Eos: 2 %
Hematocrit: 42.4 % (ref 34.0–46.6)
Hemoglobin: 13.9 g/dL (ref 11.1–15.9)
Immature Grans (Abs): 0 10*3/uL (ref 0.0–0.1)
Immature Granulocytes: 1 %
Lymphocytes Absolute: 2.8 10*3/uL (ref 0.7–3.1)
Lymphs: 39 %
MCH: 30.2 pg (ref 26.6–33.0)
MCHC: 32.8 g/dL (ref 31.5–35.7)
MCV: 92 fL (ref 79–97)
Monocytes Absolute: 0.6 10*3/uL (ref 0.1–0.9)
Monocytes: 8 %
Neutrophils Absolute: 3.5 10*3/uL (ref 1.4–7.0)
Neutrophils: 49 %
Platelets: 246 10*3/uL (ref 150–450)
RBC: 4.61 x10E6/uL (ref 3.77–5.28)
RDW: 13.5 % (ref 11.7–15.4)
WBC: 7 10*3/uL (ref 3.4–10.8)

## 2019-01-20 LAB — LIPID PANEL W/O CHOL/HDL RATIO
Cholesterol, Total: 243 mg/dL — ABNORMAL HIGH (ref 100–199)
HDL: 55 mg/dL (ref >39)
LDL Calculated: 149 mg/dL — ABNORMAL HIGH (ref 0–99)
Triglycerides: 196 mg/dL — ABNORMAL HIGH (ref 0–149)
VLDL Cholesterol Cal: 39 mg/dL (ref 5–40)

## 2019-01-20 LAB — TSH: TSH: 1.38 u[IU]/mL (ref 0.450–4.500)

## 2019-01-25 DIAGNOSIS — R030 Elevated blood-pressure reading, without diagnosis of hypertension: Secondary | ICD-10-CM | POA: Diagnosis not present

## 2019-01-25 DIAGNOSIS — Z6841 Body Mass Index (BMI) 40.0 and over, adult: Secondary | ICD-10-CM | POA: Diagnosis not present

## 2019-01-25 DIAGNOSIS — M5416 Radiculopathy, lumbar region: Secondary | ICD-10-CM | POA: Diagnosis not present

## 2019-02-19 DIAGNOSIS — H2513 Age-related nuclear cataract, bilateral: Secondary | ICD-10-CM | POA: Diagnosis not present

## 2019-04-05 ENCOUNTER — Other Ambulatory Visit: Payer: Self-pay | Admitting: Family Medicine

## 2019-04-05 NOTE — Telephone Encounter (Signed)
Forwarding medication refill request to PCP for review. 

## 2019-04-06 ENCOUNTER — Ambulatory Visit: Payer: Medicare HMO | Admitting: Family Medicine

## 2019-04-09 ENCOUNTER — Other Ambulatory Visit: Payer: Self-pay

## 2019-04-09 ENCOUNTER — Ambulatory Visit (INDEPENDENT_AMBULATORY_CARE_PROVIDER_SITE_OTHER): Payer: Medicare HMO | Admitting: Nurse Practitioner

## 2019-04-09 ENCOUNTER — Ambulatory Visit
Admission: RE | Admit: 2019-04-09 | Discharge: 2019-04-09 | Disposition: A | Discharge status: Home / Self Care | Payer: Medicare HMO | Source: Ambulatory Visit | Attending: Nurse Practitioner | Admitting: Nurse Practitioner

## 2019-04-09 ENCOUNTER — Ambulatory Visit
Admission: RE | Admit: 2019-04-09 | Discharge: 2019-04-09 | Disposition: A | Discharge status: Home / Self Care | Payer: Medicare HMO | Attending: Nurse Practitioner | Admitting: Nurse Practitioner

## 2019-04-09 ENCOUNTER — Encounter: Payer: Self-pay | Admitting: Nurse Practitioner

## 2019-04-09 VITALS — BP 137/76 | HR 72 | Temp 98.7°F | Ht 65.0 in | Wt 256.0 lb

## 2019-04-09 DIAGNOSIS — M1711 Unilateral primary osteoarthritis, right knee: Secondary | ICD-10-CM | POA: Insufficient documentation

## 2019-04-09 DIAGNOSIS — Z23 Encounter for immunization: Secondary | ICD-10-CM | POA: Diagnosis not present

## 2019-04-09 DIAGNOSIS — M25561 Pain in right knee: Secondary | ICD-10-CM | POA: Insufficient documentation

## 2019-04-09 DIAGNOSIS — M170 Bilateral primary osteoarthritis of knee: Secondary | ICD-10-CM | POA: Insufficient documentation

## 2019-04-09 IMAGING — CR DG KNEE COMPLETE 4+V*R*
1 series · 4 of 4 positions shown · non-contrast
Comparison: None available

CLINICAL DATA: Acute right knee pain for 6 weeks, atraumatic

EXAM:
RIGHT KNEE - COMPLETE 4+ VIEW

[Series 1: dg knee complete 4 views right · 0.14mm/px · 4 of 4 slices shown]
[im 1/4]
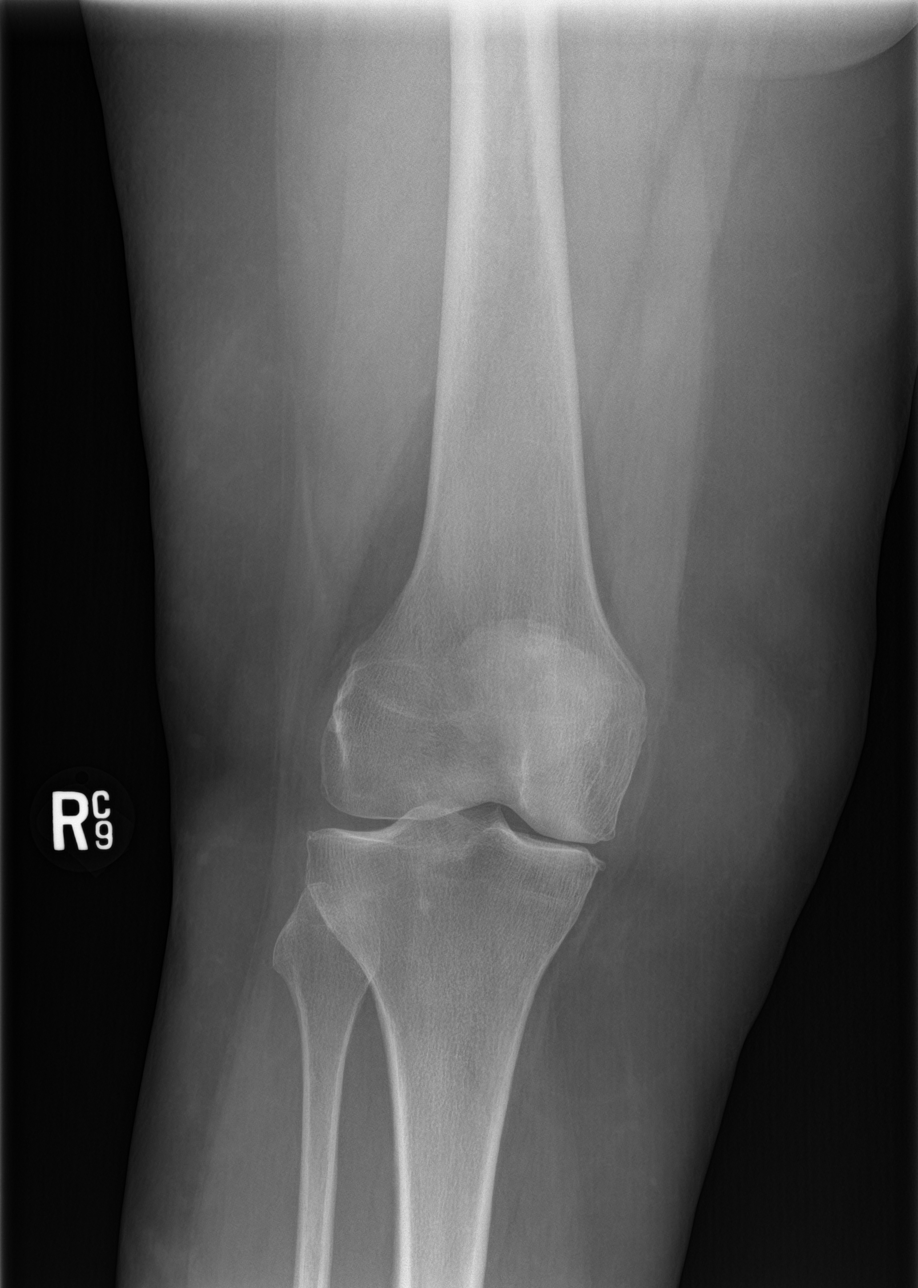
[im 2/4]
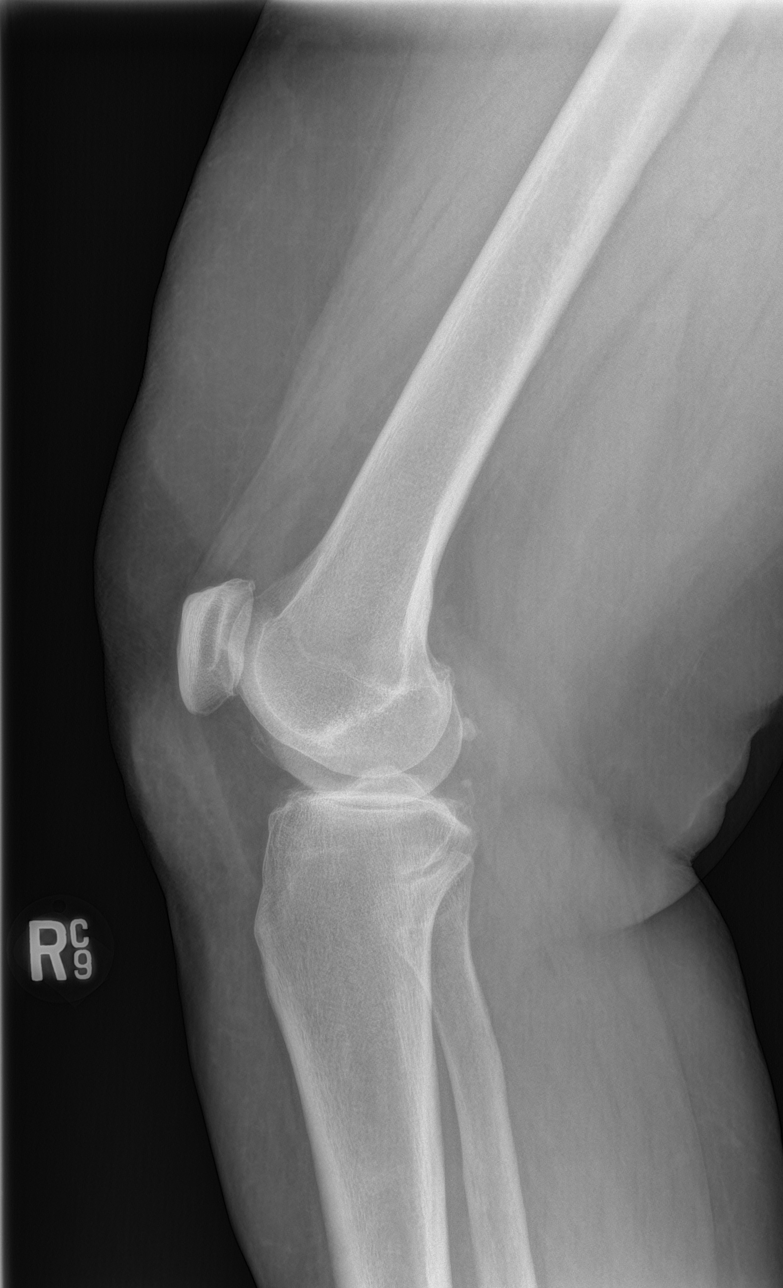
[im 3/4]
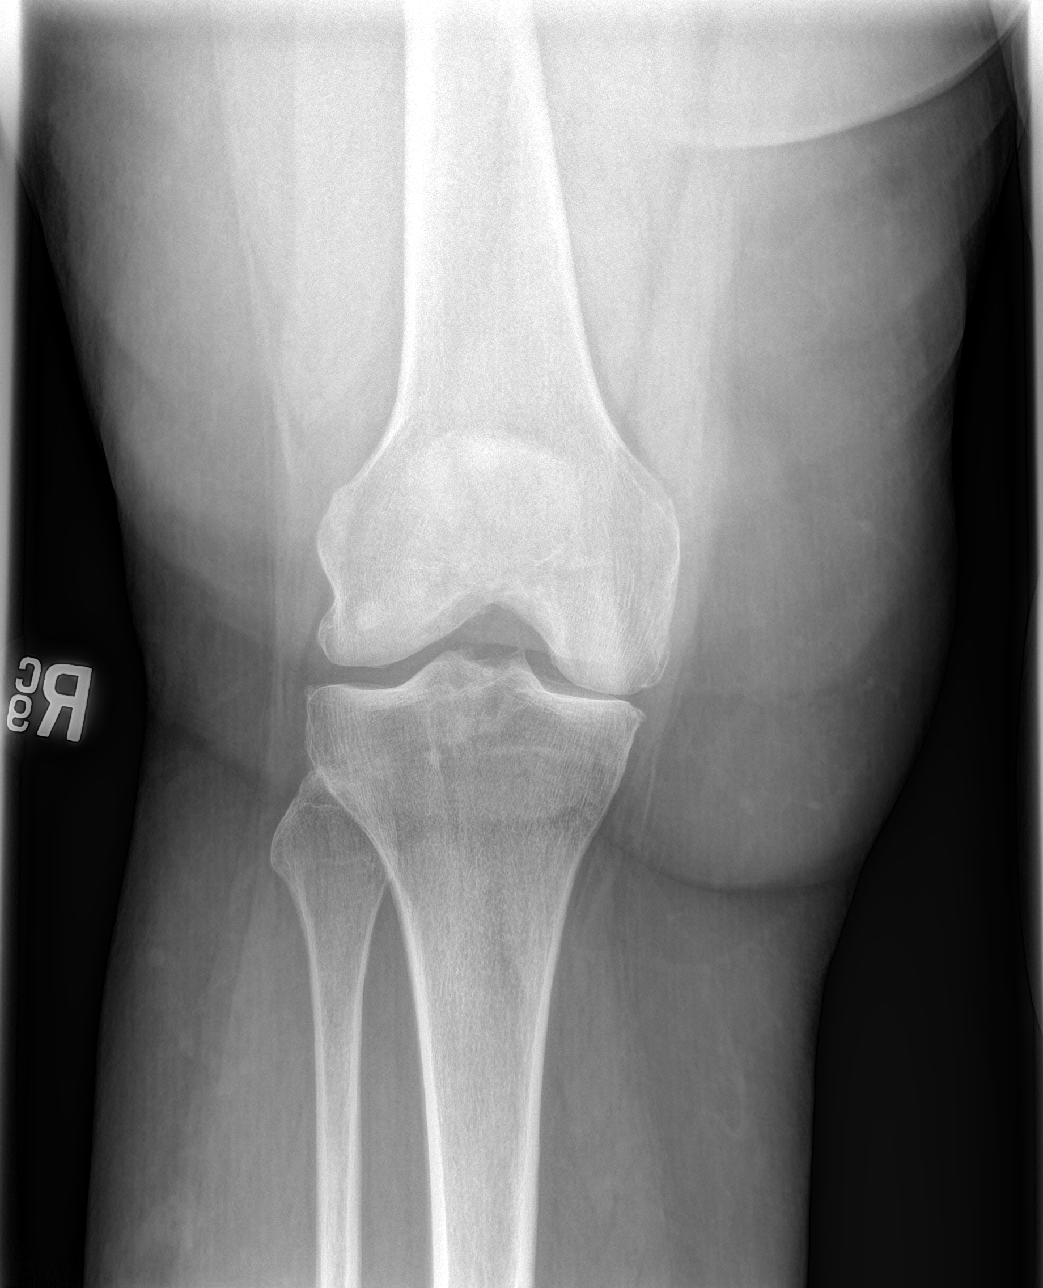
[im 4/4]
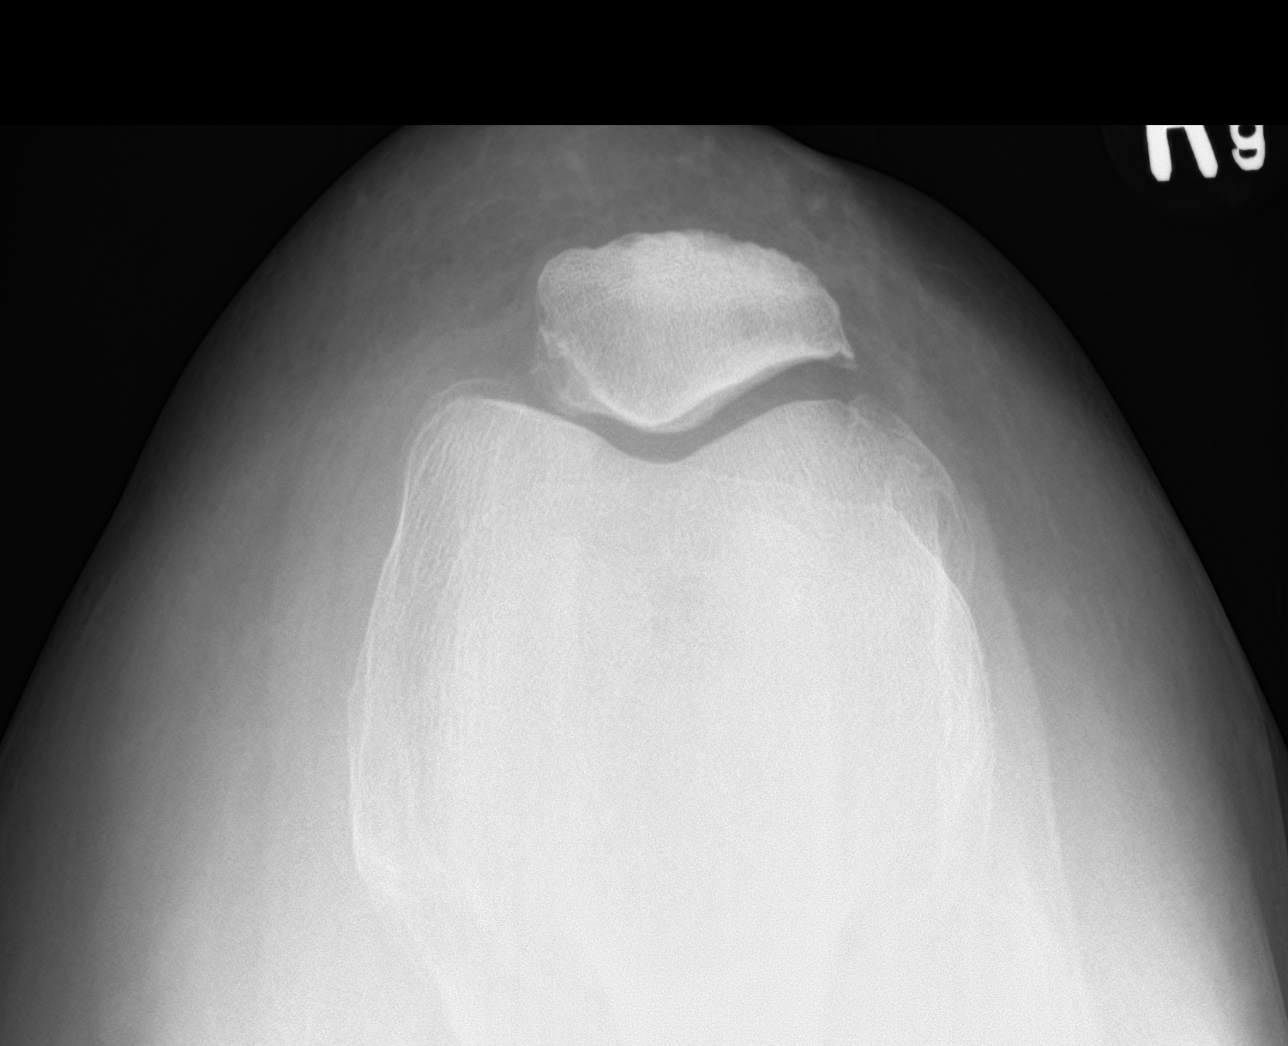

[4 of 4 positions shown; findings below may reference images not displayed]

FINDINGS: Degenerative marginal spurring mainly at the medial compartment
where there is joint space loss. Mild patellofemoral spurring is
also seen. Small joint effusion. No acute fracture or erosion.
IMPRESSION: 1. Medial and patellofemoral osteoarthritis with medial compartment
narrowing.
2. Small joint effusion.

## 2019-04-09 MED ORDER — MELOXICAM 7.5 MG PO TABS: 7.5000 mg | ORAL_TABLET | ORAL | 0 refills | Status: DC | PRN

## 2019-04-09 NOTE — Patient Instructions (Signed)
IMAGING LOCATION: [address], [place], [place] [address] [phone]   Acute Knee Pain, Adult Many things can cause knee pain. Sometimes, knee pain is sudden (acute) and may be caused by damage, swelling, or irritation of the muscles and tissues that support your knee. The pain often goes away on its own with time and rest. If the pain does not go away, tests may be done to find out what is causing the pain. Follow these instructions at home: Pay attention to any changes in your symptoms. Take these actions to relieve your pain. If you have a knee sleeve or brace:   Wear the sleeve or brace as told by your doctor. Remove it only as told by your doctor.  Loosen the sleeve or brace if your toes: ? Tingle. ? Become numb. ? Turn cold and blue.  Keep the sleeve or brace clean.  If the sleeve or brace is not waterproof: ? Do not let it get wet. ? Cover it with a watertight covering when you take a bath or shower. Activity  Rest your knee.  Do not do things that cause pain.  Avoid activities where both feet leave the ground at the same time (high-impact activities). Examples are running, jumping rope, and doing jumping jacks.  Work with a physical therapist to make a safe exercise program, as told by your doctor. Managing pain, stiffness, and swelling   If told, put ice on the knee: ? Put ice in a plastic bag. ? Place a towel between your skin and the bag. ? Leave the ice on for 20 minutes, 2-3 times a day.  If told, put pressure (compression) on your injured knee to control swelling, give support, and help with discomfort. Compression may be done with an elastic bandage. General instructions  Take all medicines only as told by your doctor.  Raise (elevate) your knee while you are sitting or lying down. Make sure your knee is higher than your heart.  Sleep with a pillow under your knee.  Do not use any products that contain nicotine or tobacco. These include  cigarettes, e-cigarettes, and chewing tobacco. These products may slow down healing. If you need help quitting, ask your doctor.  If you are overweight, work with your doctor and a food expert (dietitian) to set goals to lose weight. Being overweight can make your knee hurt more.  Keep all follow-up visits as told by your doctor. This is important. Contact a doctor if:  The knee pain does not stop.  The knee pain changes or gets worse.  You have a fever along with knee pain.  Your knee feels warm when you touch it.  Your knee gives out or locks up. Get help right away if:  Your knee swells, and the swelling gets worse.  You cannot move your knee.  You have very bad knee pain. Summary  Many things can cause knee pain. The pain often goes away on its own with time and rest.  Your doctor may do tests to find out the cause of the pain.  Pay attention to any changes in your symptoms. Relieve your pain with rest, medicines, light activity, and use of ice.  Get help right away if you cannot move your knee or your knee pain is very bad. This information is not intended to replace advice given to you by your health care provider. Make sure you discuss any questions you have with your health care provider. Document Released: 08/20/2008 Document  Revised: 11/03/2017 Document Reviewed: 11/03/2017 Elsevier Patient Education  [place].

## 2019-04-09 NOTE — Assessment & Plan Note (Signed)
Acute x 6 weeks, suspect related to overcompensation with recent back injury in July.  Will obtain imaging of right knee.  Recommend using knee brace or compression when up walking.  Script for short period of Meloxicam provided, to use minimally and only as needed.  Recommend Tylenol as needed and Voltaren gel or Icy/Hot.  Consider PT upon review of imaging results and steroid injection if ongoing pain . Return in 4 weeks.

## 2019-04-09 NOTE — Progress Notes (Signed)
BP 137/76   Pulse 72   Temp 98.7 F (37.1 C) (Oral)   Ht 5\' 5"  (1.651 m)   Wt 256 lb (116.1 kg)   SpO2 96%   BMI 42.60 kg/m    Subjective:    Patient ID: Erika Anderson, female    DOB: 03-29-1951, 68 y.o.   MRN: [id number]  HPI: Erika Anderson is a 68 y.o. female  Chief Complaint  Patient presents with  . Knee Pain    right. x several weeks. pt states bottom of the tailbone is sore as well   KNEE PAIN Started about 6 weeks ago to right knee.  Reports ever since she had "sciatic nerve thing" she has been having knee issues bilaterally.  Reports pain also at bottom of tail bone.  Was in office on 12/07/2018 for left sided sciatic nerve pain, had been working in yard at time.  Reports continued tenderness to tailbone since then and now right knee pain.  MRI in July did note bulging disc with impingement L5. When moving no pain, but after sitting down for while and getting back up there is pain.  Is currently taking Gabapentin as needed, recently restarted, had stopped taking it for one month.   Duration: weeks Involved knee: right Mechanism of injury: unknown Location:diffuse Onset: gradual Severity: 8/10  Quality:  dull, aching and throbbing Frequency: intermittent Radiation: no Aggravating factors: laying down and prolonged sitting  Alleviating factors: ice and NSAIDs  Status: fluctuating Treatments attempted: ice and ibuprofen  Relief with NSAIDs?:  mild Weakness with weight bearing or walking: no Sensation of giving way: no Locking: no Popping: no Bruising: no Swelling: no Redness: no Paresthesias/decreased sensation: no Fevers: no  Relevant past medical, surgical, family and social history reviewed and updated as indicated. Interim medical history since our last visit reviewed. Allergies and medications reviewed and updated.  Review of Systems  Constitutional: Negative for activity change, appetite change, diaphoresis, fatigue and fever.  Respiratory:  Negative for cough, chest tightness and shortness of breath.   Cardiovascular: Negative for chest pain, palpitations and leg swelling.  Gastrointestinal: Negative for abdominal distention, abdominal pain, constipation, diarrhea, nausea and vomiting.  Musculoskeletal: Positive for arthralgias.  Psychiatric/Behavioral: Negative.     Per HPI unless specifically indicated above     Objective:    BP 137/76   Pulse 72   Temp 98.7 F (37.1 C) (Oral)   Ht 5\' 5"  (1.651 m)   Wt 256 lb (116.1 kg)   SpO2 96%   BMI 42.60 kg/m   Wt Readings from Last 3 Encounters:  04/09/19 256 lb (116.1 kg)  01/19/19 246 lb (111.6 kg)  05/10/18 240 lb (108.9 kg)    Physical Exam Vitals signs and nursing note reviewed.  Constitutional:      General: She is awake. She is not in acute distress.    Appearance: She is well-developed. She is obese. She is not ill-appearing.  HENT:     Head: Normocephalic.     Right Ear: Hearing normal.     Left Ear: Hearing normal.  Eyes:     General: Lids are normal.        Right eye: No discharge.        Left eye: No discharge.     Conjunctiva/sclera: Conjunctivae normal.     Pupils: Pupils are equal, round, and reactive to light.  Neck:     Musculoskeletal: Normal range of motion and neck supple.     Vascular:  No carotid bruit.  Cardiovascular:     Rate and Rhythm: Normal rate and regular rhythm.     Heart sounds: Normal heart sounds. No murmur. No gallop.   Pulmonary:     Effort: Pulmonary effort is normal. No accessory muscle usage or respiratory distress.     Breath sounds: Normal breath sounds.  Abdominal:     General: Bowel sounds are normal.     Palpations: Abdomen is soft.  Musculoskeletal:     Right knee: She exhibits normal range of motion, no swelling, no effusion, no laceration, no erythema and no bony tenderness. Tenderness (anterior) found.     Left knee: She exhibits normal range of motion, no swelling, no effusion, no laceration, no erythema and  no bony tenderness. No tenderness found.     Lumbar back: She exhibits normal range of motion, no tenderness, no swelling, no edema, no laceration, no pain and no spasm.     Right lower leg: No edema.     Left lower leg: No edema.     Comments: Slow to get out of chair due to discomfort, but then able to walk without difficulty in room after a minute.  Crepitus noted on right knee exam.  Mild tenderness on palpation anterior aspect (medial and lateral).  No rashes noted.    Skin:    General: Skin is warm and dry.  Neurological:     Mental Status: She is alert and oriented to person, place, and time.  Psychiatric:        Attention and Perception: Attention normal.        Mood and Affect: Mood normal.        Behavior: Behavior normal. Behavior is cooperative.        Thought Content: Thought content normal.        Judgment: Judgment normal.     Results for orders placed or performed in visit on 01/19/19  CBC with Differential/Platelet  Result Value Ref Range   WBC 7.0 3.4 - 10.8 x10E3/uL   RBC 4.61 3.77 - 5.28 x10E6/uL   Hemoglobin 13.9 11.1 - 15.9 g/dL   Hematocrit 42.4 34.0 - 46.6 %   MCV 92 79 - 97 fL   MCH 30.2 26.6 - 33.0 pg   MCHC 32.8 31.5 - 35.7 g/dL   RDW 13.5 11.7 - 15.4 %   Platelets 246 150 - 450 x10E3/uL   Neutrophils 49 Not Estab. %   Lymphs 39 Not Estab. %   Monocytes 8 Not Estab. %   Eos 2 Not Estab. %   Basos 1 Not Estab. %   Neutrophils Absolute 3.5 1.4 - 7.0 x10E3/uL   Lymphocytes Absolute 2.8 0.7 - 3.1 x10E3/uL   Monocytes Absolute 0.6 0.1 - 0.9 x10E3/uL   EOS (ABSOLUTE) 0.1 0.0 - 0.4 x10E3/uL   Basophils Absolute 0.1 0.0 - 0.2 x10E3/uL   Immature Granulocytes 1 Not Estab. %   Immature Grans (Abs) 0.0 0.0 - 0.1 x10E3/uL  Comprehensive metabolic panel  Result Value Ref Range   Glucose 93 65 - 99 mg/dL   BUN 20 8 - 27 mg/dL   Creatinine, Ser 0.80 0.57 - 1.00 mg/dL   GFR calc non Af Amer 77 >59 mL/min/1.73   GFR calc Af Amer 88 >59 mL/min/1.73    BUN/Creatinine Ratio 25 12 - 28   Sodium 143 134 - 144 mmol/L   Potassium 4.3 3.5 - 5.2 mmol/L   Chloride 104 96 - 106 mmol/L   CO2 25 20 -  29 mmol/L   Calcium 9.7 8.7 - 10.3 mg/dL   Total Protein 6.7 6.0 - 8.5 g/dL   Albumin 4.5 3.8 - 4.8 g/dL   Globulin, Total 2.2 1.5 - 4.5 g/dL   Albumin/Globulin Ratio 2.0 1.2 - 2.2   Bilirubin Total 0.3 0.0 - 1.2 mg/dL   Alkaline Phosphatase 93 39 - 117 IU/L   AST 26 0 - 40 IU/L   ALT 37 (H) 0 - 32 IU/L  Lipid Panel w/o Chol/HDL Ratio  Result Value Ref Range   Cholesterol, Total 243 (H) 100 - 199 mg/dL   Triglycerides 196 (H) 0 - 149 mg/dL   HDL 55 >39 mg/dL   VLDL Cholesterol Cal 39 5 - 40 mg/dL   LDL Calculated 149 (H) 0 - 99 mg/dL  TSH  Result Value Ref Range   TSH 1.380 0.450 - 4.500 uIU/mL      Assessment & Plan:   Problem List Items Addressed This Visit      Other   Acute pain of right knee - Primary    Acute x 6 weeks, suspect related to overcompensation with recent back injury in July.  Will obtain imaging of right knee.  Recommend using knee brace or compression when up walking.  Script for short period of Meloxicam provided, to use minimally and only as needed.  Recommend Tylenol as needed and Voltaren gel or Icy/Hot.  Consider PT upon review of imaging results and steroid injection if ongoing pain . Return in 4 weeks.      Relevant Orders   DG Knee Complete 4 Views Right    Other Visit Diagnoses    Flu vaccine need       Relevant Orders   Flu Vaccine QUAD High Dose(Fluad) (Completed)       Follow up plan: Return in about 4 weeks (around 05/07/2019) for right knee pain.

## 2019-05-02 DIAGNOSIS — M1990 Unspecified osteoarthritis, unspecified site: Secondary | ICD-10-CM | POA: Diagnosis not present

## 2019-05-02 DIAGNOSIS — Z791 Long term (current) use of non-steroidal anti-inflammatories (NSAID): Secondary | ICD-10-CM | POA: Diagnosis not present

## 2019-05-02 DIAGNOSIS — E669 Obesity, unspecified: Secondary | ICD-10-CM | POA: Diagnosis not present

## 2019-05-02 DIAGNOSIS — Z809 Family history of malignant neoplasm, unspecified: Secondary | ICD-10-CM | POA: Diagnosis not present

## 2019-05-02 DIAGNOSIS — M5430 Sciatica, unspecified side: Secondary | ICD-10-CM | POA: Diagnosis not present

## 2019-05-02 DIAGNOSIS — Z8249 Family history of ischemic heart disease and other diseases of the circulatory system: Secondary | ICD-10-CM | POA: Diagnosis not present

## 2019-05-02 DIAGNOSIS — G8929 Other chronic pain: Secondary | ICD-10-CM | POA: Diagnosis not present

## 2019-05-02 DIAGNOSIS — G2581 Restless legs syndrome: Secondary | ICD-10-CM | POA: Diagnosis not present

## 2019-05-08 ENCOUNTER — Ambulatory Visit (INDEPENDENT_AMBULATORY_CARE_PROVIDER_SITE_OTHER): Payer: Medicare HMO | Admitting: Nurse Practitioner

## 2019-05-08 ENCOUNTER — Other Ambulatory Visit: Payer: Self-pay

## 2019-05-08 ENCOUNTER — Encounter: Payer: Self-pay | Admitting: Nurse Practitioner

## 2019-05-08 DIAGNOSIS — M1711 Unilateral primary osteoarthritis, right knee: Secondary | ICD-10-CM | POA: Diagnosis not present

## 2019-05-08 MED ORDER — MELOXICAM 7.5 MG PO TABS: 7.5000 mg | ORAL_TABLET | ORAL | 0 refills | Status: DC | PRN

## 2019-05-08 NOTE — Assessment & Plan Note (Signed)
Acute and improved at this time. Recommend using knee brace or compression when up walking.  Refill on Meloxicam provided, to use minimally and only as needed.  Recommend Tylenol as needed and Voltaren gel or Icy/Hot.  Consider PT or steroid injection in future if pain returns . Return for worsening or return of pain.

## 2019-05-08 NOTE — Patient Instructions (Signed)
Acute Knee Pain, Adult Many things can cause knee pain. Sometimes, knee pain is sudden (acute) and may be caused by damage, swelling, or irritation of the muscles and tissues that support your knee. The pain often goes away on its own with time and rest. If the pain does not go away, tests may be done to find out what is causing the pain. Follow these instructions at home: Pay attention to any changes in your symptoms. Take these actions to relieve your pain. If you have a knee sleeve or brace:   Wear the sleeve or brace as told by your doctor. Remove it only as told by your doctor.  Loosen the sleeve or brace if your toes: ? Tingle. ? Become numb. ? Turn cold and blue.  Keep the sleeve or brace clean.  If the sleeve or brace is not waterproof: ? Do not let it get wet. ? Cover it with a watertight covering when you take a bath or shower. Activity  Rest your knee.  Do not do things that cause pain.  Avoid activities where both feet leave the ground at the same time (high-impact activities). Examples are running, jumping rope, and doing jumping jacks.  Work with a physical therapist to make a safe exercise program, as told by your doctor. Managing pain, stiffness, and swelling   If told, put ice on the knee: ? Put ice in a plastic bag. ? Place a towel between your skin and the bag. ? Leave the ice on for 20 minutes, 2-3 times a day.  If told, put pressure (compression) on your injured knee to control swelling, give support, and help with discomfort. Compression may be done with an elastic bandage. General instructions  Take all medicines only as told by your doctor.  Raise (elevate) your knee while you are sitting or lying down. Make sure your knee is higher than your heart.  Sleep with a pillow under your knee.  Do not use any products that contain nicotine or tobacco. These include cigarettes, e-cigarettes, and chewing tobacco. These products may slow down healing. If  you need help quitting, ask your doctor.  If you are overweight, work with your doctor and a food expert (dietitian) to set goals to lose weight. Being overweight can make your knee hurt more.  Keep all follow-up visits as told by your doctor. This is important. Contact a doctor if:  The knee pain does not stop.  The knee pain changes or gets worse.  You have a fever along with knee pain.  Your knee feels warm when you touch it.  Your knee gives out or locks up. Get help right away if:  Your knee swells, and the swelling gets worse.  You cannot move your knee.  You have very bad knee pain. Summary  Many things can cause knee pain. The pain often goes away on its own with time and rest.  Your doctor may do tests to find out the cause of the pain.  Pay attention to any changes in your symptoms. Relieve your pain with rest, medicines, light activity, and use of ice.  Get help right away if you cannot move your knee or your knee pain is very bad. This information is not intended to replace advice given to you by your health care provider. Make sure you discuss any questions you have with your health care provider. Document Released: 08/20/2008 Document Revised: 11/03/2017 Document Reviewed: 11/03/2017 Elsevier Patient Education  2020 [employer].

## 2019-05-08 NOTE — Progress Notes (Signed)
There were no vitals taken for this visit.   Subjective:    Patient ID: Erika Anderson, female    DOB: [birth year], 68 y.o.   MRN: [id number]  HPI: Erika Anderson is a 68 y.o. female  Chief Complaint  Patient presents with  . Knee Pain    4 week f/up    . This visit was completed via FaceTime due to the restrictions of the COVID-19 pandemic. All issues as above were discussed and addressed. Physical exam was done as above through visual confirmation on Facetime. If it was felt that the patient should be evaluated in the office, they were directed there. The patient verbally consented to this visit. . Location of the patient: home . Location of the provider: work . Those involved with this call:  . Provider: Marnee Guarneri, DNP . CMA: Yvonna Alanis, CMA . Front Desk/Registration: Jill Side  . Time spent on call: 15 minutes with patient face to face via video conference. More than 50% of this time was spent in counseling and coordination of care. 10 minutes total spent in review of patient's record and preparation of their chart.  . I verified patient identity using two factors (patient name and date of birth). Patient consents verbally to being seen via telemedicine visit today.    KNEE PAIN Initial visit for this on 04/09/19 when pain had started about 6 weeks prior to right knee.  Reports ever since she had "sciatic nerve thing" she has been having knee issues bilaterally.  Reports pain also at bottom of tail bone.  Was in office on 12/07/2018 for left sided sciatic nerve pain, had been working in yard at time. Reports continued tenderness to tailbone since then and now right knee pain. MRI in July did note bulging disc with impingement L5. When moving no pain, but after sitting down for while and getting back up there is pain.  Is currently taking Gabapentin as needed.  Imaging on 04/09/19 did noted medial and patellofemoral OA.  Reports yesterday she went outside and up  and down steps + walked in yard.  Has had no pain in the past 2 days and overall reports knee pain has improved. Discussed with her progression of OA over time and different methods we can utilize if pain returns, such as PT or knee injections.  Duration: weeks Involved knee: right Mechanism of injury: unknown Location:diffuse Relief with NSAIDs?:  moderate Weakness with weight bearing or walking: no Sensation of giving way: no Locking: no Popping: no Bruising: no Swelling: no Redness: no Paresthesias/decreased sensation: no Fevers: no  Relevant past medical, surgical, family and social history reviewed and updated as indicated. Interim medical history since our last visit reviewed. Allergies and medications reviewed and updated.  Review of Systems  Constitutional: Negative for activity change, appetite change, diaphoresis, fatigue and fever.  Respiratory: Negative for cough, chest tightness and shortness of breath.   Cardiovascular: Negative for chest pain, palpitations and leg swelling.  Gastrointestinal: Negative for abdominal distention, abdominal pain, constipation, diarrhea, nausea and vomiting.  Musculoskeletal: Negative for arthralgias.  Neurological: Negative.   Psychiatric/Behavioral: Negative.     Per HPI unless specifically indicated above     Objective:    There were no vitals taken for this visit.  Wt Readings from Last 3 Encounters:  04/09/19 256 lb (116.1 kg)  01/19/19 246 lb (111.6 kg)  05/10/18 240 lb (108.9 kg)    Physical Exam Vitals signs and nursing note reviewed.  Constitutional:  General: She is awake. She is not in acute distress.    Appearance: She is well-developed. She is not ill-appearing.  HENT:     Head: Normocephalic.     Right Ear: Hearing normal.     Left Ear: Hearing normal.  Eyes:     General: Lids are normal.        Right eye: No discharge.        Left eye: No discharge.     Conjunctiva/sclera: Conjunctivae normal.  Neck:      Musculoskeletal: Normal range of motion.  Pulmonary:     Effort: Pulmonary effort is normal. No accessory muscle usage or respiratory distress.  Musculoskeletal:     Comments: Unable to visualize knee due to poor connection.  Neurological:     Mental Status: She is alert and oriented to person, place, and time.  Psychiatric:        Attention and Perception: Attention normal.        Mood and Affect: Mood normal.        Behavior: Behavior normal. Behavior is cooperative.        Thought Content: Thought content normal.        Judgment: Judgment normal.     Results for orders placed or performed in visit on 01/19/19  CBC with Differential/Platelet  Result Value Ref Range   WBC 7.0 3.4 - 10.8 x10E3/uL   RBC 4.61 3.77 - 5.28 x10E6/uL   Hemoglobin 13.9 11.1 - 15.9 g/dL   Hematocrit 42.4 34.0 - 46.6 %   MCV 92 79 - 97 fL   MCH 30.2 26.6 - 33.0 pg   MCHC 32.8 31.5 - 35.7 g/dL   RDW 13.5 11.7 - 15.4 %   Platelets 246 150 - 450 x10E3/uL   Neutrophils 49 Not Estab. %   Lymphs 39 Not Estab. %   Monocytes 8 Not Estab. %   Eos 2 Not Estab. %   Basos 1 Not Estab. %   Neutrophils Absolute 3.5 1.4 - 7.0 x10E3/uL   Lymphocytes Absolute 2.8 0.7 - 3.1 x10E3/uL   Monocytes Absolute 0.6 0.1 - 0.9 x10E3/uL   EOS (ABSOLUTE) 0.1 0.0 - 0.4 x10E3/uL   Basophils Absolute 0.1 0.0 - 0.2 x10E3/uL   Immature Granulocytes 1 Not Estab. %   Immature Grans (Abs) 0.0 0.0 - 0.1 x10E3/uL  Comprehensive metabolic panel  Result Value Ref Range   Glucose 93 65 - 99 mg/dL   BUN 20 8 - 27 mg/dL   Creatinine, Ser 0.80 0.57 - 1.00 mg/dL   GFR calc non Af Amer 77 >59 mL/min/1.73   GFR calc Af Amer 88 >59 mL/min/1.73   BUN/Creatinine Ratio 25 12 - 28   Sodium 143 134 - 144 mmol/L   Potassium 4.3 3.5 - 5.2 mmol/L   Chloride 104 96 - 106 mmol/L   CO2 25 20 - 29 mmol/L   Calcium 9.7 8.7 - 10.3 mg/dL   Total Protein 6.7 6.0 - 8.5 g/dL   Albumin 4.5 3.8 - 4.8 g/dL   Globulin, Total 2.2 1.5 - 4.5 g/dL    Albumin/Globulin Ratio 2.0 1.2 - 2.2   Bilirubin Total 0.3 0.0 - 1.2 mg/dL   Alkaline Phosphatase 93 39 - 117 IU/L   AST 26 0 - 40 IU/L   ALT 37 (H) 0 - 32 IU/L  Lipid Panel w/o Chol/HDL Ratio  Result Value Ref Range   Cholesterol, Total 243 (H) 100 - 199 mg/dL   Triglycerides 196 (H) 0 -  149 mg/dL   HDL 55 >39 mg/dL   VLDL Cholesterol Cal 39 5 - 40 mg/dL   LDL Calculated 149 (H) 0 - 99 mg/dL  TSH  Result Value Ref Range   TSH 1.380 0.450 - 4.500 uIU/mL      Assessment & Plan:   Problem List Items Addressed This Visit      Musculoskeletal and Integument   Osteoarthritis of right knee    Acute and improved at this time. Recommend using knee brace or compression when up walking.  Refill on Meloxicam provided, to use minimally and only as needed.  Recommend Tylenol as needed and Voltaren gel or Icy/Hot.  Consider PT or steroid injection in future if pain returns . Return for worsening or return of pain.      Relevant Medications   meloxicam (MOBIC) 7.5 MG tablet      I discussed the assessment and treatment plan with the patient. The patient was provided an opportunity to ask questions and all were answered. The patient agreed with the plan and demonstrated an understanding of the instructions.   The patient was advised to call back or seek an in-person evaluation if the symptoms worsen or if the condition fails to improve as anticipated.   I provided 15 minutes of time during this encounter.  Follow up plan: Return if symptoms worsen or fail to improve.

## 2019-05-09 DIAGNOSIS — Z03818 Encounter for observation for suspected exposure to other biological agents ruled out: Secondary | ICD-10-CM | POA: Diagnosis not present

## 2019-05-09 DIAGNOSIS — Z20828 Contact with and (suspected) exposure to other viral communicable diseases: Secondary | ICD-10-CM | POA: Diagnosis not present

## 2019-06-12 ENCOUNTER — Other Ambulatory Visit: Payer: Self-pay | Admitting: Nurse Practitioner

## 2019-07-02 ENCOUNTER — Other Ambulatory Visit: Payer: Self-pay | Admitting: Nurse Practitioner

## 2019-07-06 ENCOUNTER — Other Ambulatory Visit: Payer: Self-pay | Admitting: Nurse Practitioner

## 2019-07-06 MED ORDER — MELOXICAM 7.5 MG PO TABS: ORAL_TABLET | ORAL | 0 refills | Status: DC

## 2019-07-06 NOTE — Telephone Encounter (Signed)
Medication: meloxicam (MOBIC) 7.5 MG tablet [id number]   Has the patient contacted their pharmacy? Yes  (Agent: If no, request that the patient contact the pharmacy for the refill.) (Agent: If yes, when and what did the pharmacy advise?)  Preferred Pharmacy (with phone number or street name): [place], [place]  Phone:  [phone] Fax:  [phone]     Agent: Please be advised that RX refills may take up to 3 business days. We ask that you follow-up with your pharmacy.

## 2019-07-27 ENCOUNTER — Other Ambulatory Visit: Payer: Self-pay | Admitting: Nurse Practitioner

## 2019-07-27 DIAGNOSIS — G2581 Restless legs syndrome: Secondary | ICD-10-CM

## 2019-07-30 ENCOUNTER — Encounter: Payer: Self-pay | Admitting: Nurse Practitioner

## 2019-07-30 ENCOUNTER — Ambulatory Visit (INDEPENDENT_AMBULATORY_CARE_PROVIDER_SITE_OTHER): Payer: Medicare HMO | Admitting: Nurse Practitioner

## 2019-07-30 ENCOUNTER — Telehealth: Payer: Self-pay | Admitting: Nurse Practitioner

## 2019-07-30 ENCOUNTER — Other Ambulatory Visit: Payer: Self-pay

## 2019-07-30 VITALS — BP 131/75 | HR 80 | Temp 98.2°F

## 2019-07-30 DIAGNOSIS — M170 Bilateral primary osteoarthritis of knee: Secondary | ICD-10-CM

## 2019-07-30 DIAGNOSIS — M25842 Other specified joint disorders, left hand: Secondary | ICD-10-CM | POA: Diagnosis not present

## 2019-07-30 DIAGNOSIS — F5104 Psychophysiologic insomnia: Secondary | ICD-10-CM

## 2019-07-30 DIAGNOSIS — G4700 Insomnia, unspecified: Secondary | ICD-10-CM | POA: Insufficient documentation

## 2019-07-30 DIAGNOSIS — R69 Illness, unspecified: Secondary | ICD-10-CM | POA: Diagnosis not present

## 2019-07-30 NOTE — Telephone Encounter (Signed)
Scheduled appt for 3:00 today

## 2019-07-30 NOTE — Progress Notes (Signed)
BP 131/75   Pulse 80   Temp 98.2 F (36.8 C) (Oral)   SpO2 98%    Subjective:    Patient ID: Erika Anderson, female    DOB: Sep 17, 1950, 69 y.o.   MRN: [id number]  HPI: Erika Anderson is a 69 y.o. female  Chief Complaint  Patient presents with  . Pain    pt states she has been having bilateral knee pain, mainly during the night that she states wakes her up  . Insomnia    pt states she has been having worsening problems with her sleep, falling and staying asleep difficulties  . Cyst    top of left hand, noticed it over the weekend, not painful per patient but does move around   [place] Has being ongoing since November with right knee, Meloxicam helped for a short period, and then about three weeks ago became painful again even with medicine on board.  Left knee started hurting a few weeks back.  Had imaging done in November showing OA medial and patellofemoral with medial compartment narrowing right knee, no imaging done of left knee at that time as no pain reported in that knee.   Duration: months Involved knee: bilateral Mechanism of injury: unknown Location:anterior Onset: gradual Severity: 10/10 at worst and 6/10 at best Quality:  dull and aching Frequency: intermittent Radiation: yes Aggravating factors: prolonged sitting  Alleviating factors: ice, APAP and NSAIDs  Status: worse Treatments attempted: Voltaren, ice, heat, APAP and ibuprofen  Relief with NSAIDs?:  mild Weakness with weight bearing or walking: no Sensation of giving way: no Locking: no Popping: yes Bruising: no Swelling: no Redness: no Paresthesias/decreased sensation: no Fevers: no   INSOMNIA Reports this became more prevalent with knee pain, her sister is taking Trazodone which has been helpful and she would like to try this as needed. Duration: months Satisfied with sleep quality: no Difficulty falling asleep: yes Difficulty staying asleep: yes Waking a few hours after sleep onset:  yes Early morning awakenings: no Daytime hypersomnolence: no Wakes feeling refreshed: no Good sleep hygiene: yes Apnea: no Snoring: yes Depressed/anxious mood: no Recent stress: no Restless legs/nocturnal leg cramps: no Chronic pain/arthritis: yes History of sleep study: no Treatments attempted: Melatonin  SKIN MASS Noticed it this week to left hand, small.  No discomfort or redness.   Duration: days Location: left dorsal hand Painful: no Itching: no Onset: gradual Context: not changing Associated signs and symptoms: none History of skin cancer: no  Relevant past medical, surgical, family and social history reviewed and updated as indicated. Interim medical history since our last visit reviewed. Allergies and medications reviewed and updated.  Review of Systems  Constitutional: Negative for activity change, appetite change, diaphoresis, fatigue and fever.  Respiratory: Negative for cough, chest tightness, shortness of breath and wheezing.   Cardiovascular: Negative for chest pain, palpitations and leg swelling.  Gastrointestinal: Negative.   Musculoskeletal: Positive for arthralgias.  Neurological: Negative.   Psychiatric/Behavioral: Positive for sleep disturbance. Negative for decreased concentration, self-injury and suicidal ideas. The patient is not nervous/anxious.     Per HPI unless specifically indicated above     Objective:    BP 131/75   Pulse 80   Temp 98.2 F (36.8 C) (Oral)   SpO2 98%   Wt Readings from Last 3 Encounters:  04/09/19 256 lb (116.1 kg)  01/19/19 246 lb (111.6 kg)  05/10/18 240 lb (108.9 kg)    Physical Exam Vitals and nursing note reviewed.  Constitutional:  General: She is awake. She is not in acute distress.    Appearance: She is well-developed and overweight. She is not ill-appearing.  HENT:     Head: Normocephalic.     Right Ear: Hearing normal.     Left Ear: Hearing normal.  Eyes:     General: Lids are normal.         Right eye: No discharge.        Left eye: No discharge.     Conjunctiva/sclera: Conjunctivae normal.     Pupils: Pupils are equal, round, and reactive to light.  Neck:     Vascular: No carotid bruit.  Cardiovascular:     Rate and Rhythm: Normal rate and regular rhythm.     Heart sounds: Normal heart sounds. No murmur. No gallop.   Pulmonary:     Effort: Pulmonary effort is normal. No accessory muscle usage or respiratory distress.     Breath sounds: Normal breath sounds.  Abdominal:     General: Bowel sounds are normal.     Palpations: Abdomen is soft.  Musculoskeletal:     Cervical back: Normal range of motion and neck supple.     Right knee: Crepitus present. No swelling or erythema. Normal range of motion. No tenderness. Normal alignment, normal meniscus and normal patellar mobility.     Instability Tests: Negative medial McMurray test and negative lateral McMurray test.     Left knee: Crepitus present. No swelling or erythema. Normal range of motion. No tenderness. Normal alignment, normal meniscus and normal patellar mobility.     Instability Tests: Negative medial McMurray test and negative lateral McMurray test.     Right lower leg: No edema.     Left lower leg: No edema.     Comments: Mild tenderness with flexion and extension reported.  Slightly antalgic gait noted.  Skin:    General: Skin is warm and dry.       Neurological:     Mental Status: She is alert and oriented to person, place, and time.  Psychiatric:        Attention and Perception: Attention normal.        Mood and Affect: Mood normal.        Speech: Speech normal.        Behavior: Behavior normal. Behavior is cooperative.    Results for orders placed or performed in visit on 01/19/19  CBC with Differential/Platelet  Result Value Ref Range   WBC 7.0 3.4 - 10.8 x10E3/uL   RBC 4.61 3.77 - 5.28 x10E6/uL   Hemoglobin 13.9 11.1 - 15.9 g/dL   Hematocrit 42.4 34.0 - 46.6 %   MCV 92 79 - 97 fL   MCH 30.2 26.6  - 33.0 pg   MCHC 32.8 31.5 - 35.7 g/dL   RDW 13.5 11.7 - 15.4 %   Platelets 246 150 - 450 x10E3/uL   Neutrophils 49 Not Estab. %   Lymphs 39 Not Estab. %   Monocytes 8 Not Estab. %   Eos 2 Not Estab. %   Basos 1 Not Estab. %   Neutrophils Absolute 3.5 1.4 - 7.0 x10E3/uL   Lymphocytes Absolute 2.8 0.7 - 3.1 x10E3/uL   Monocytes Absolute 0.6 0.1 - 0.9 x10E3/uL   EOS (ABSOLUTE) 0.1 0.0 - 0.4 x10E3/uL   Basophils Absolute 0.1 0.0 - 0.2 x10E3/uL   Immature Granulocytes 1 Not Estab. %   Immature Grans (Abs) 0.0 0.0 - 0.1 x10E3/uL  Comprehensive metabolic panel  Result Value Ref  Range   Glucose 93 65 - 99 mg/dL   BUN 20 8 - 27 mg/dL   Creatinine, Ser 0.80 0.57 - 1.00 mg/dL   GFR calc non Af Amer 77 >59 mL/min/1.73   GFR calc Af Amer 88 >59 mL/min/1.73   BUN/Creatinine Ratio 25 12 - 28   Sodium 143 134 - 144 mmol/L   Potassium 4.3 3.5 - 5.2 mmol/L   Chloride 104 96 - 106 mmol/L   CO2 25 20 - 29 mmol/L   Calcium 9.7 8.7 - 10.3 mg/dL   Total Protein 6.7 6.0 - 8.5 g/dL   Albumin 4.5 3.8 - 4.8 g/dL   Globulin, Total 2.2 1.5 - 4.5 g/dL   Albumin/Globulin Ratio 2.0 1.2 - 2.2   Bilirubin Total 0.3 0.0 - 1.2 mg/dL   Alkaline Phosphatase 93 39 - 117 IU/L   AST 26 0 - 40 IU/L   ALT 37 (H) 0 - 32 IU/L  Lipid Panel w/o Chol/HDL Ratio  Result Value Ref Range   Cholesterol, Total 243 (H) 100 - 199 mg/dL   Triglycerides 196 (H) 0 - 149 mg/dL   HDL 55 >39 mg/dL   VLDL Cholesterol Cal 39 5 - 40 mg/dL   LDL Calculated 149 (H) 0 - 99 mg/dL  TSH  Result Value Ref Range   TSH 1.380 0.450 - 4.500 uIU/mL      Assessment & Plan:   Problem List Items Addressed This Visit      Musculoskeletal and Integument   Osteoarthritis of both knees - Primary    Chronic, for months right knee with no further benefit from Meloxicam and new discomfort left knee.  Plan: - Continue Meloxicam as needed - Lidocaine patches, Voltaren gel, Tylenol, ice/heat as needed at home - Wear knee brace for  support/comfort - Referral to physical therapy - Referral to orthopedics - Would benefit from modest weight loss Return for worsening or continued pain, could consider steroid injection in office.       Relevant Orders   Ambulatory referral to Orthopedics   Ambulatory referral to Physical Therapy     Other   Insomnia    New secondary to OA and RLS.  Will trial Trazodone as needed, script sent in for 25-50 MG PRN QHS.  May benefit both sleep and mood with current pain issues.  Refer to OA plan for pain plan.  Return in 6 weeks for follow-up.      Cyst of joint of left hand    Dorsal aspect left hand.  Aprrox 1/2 cm and mobile.  No tenderness or s/s infection.  At this time will watchful wait.  If any signs or symptoms infection present OR changes in size/shape then will refer to general surgery for assessment.            Follow up plan: Return in about 6 weeks (around 09/10/2019) for Insomnia.

## 2019-07-30 NOTE — Assessment & Plan Note (Signed)
New secondary to OA and RLS.  Will trial Trazodone as needed, script sent in for 25-50 MG PRN QHS.  May benefit both sleep and mood with current pain issues.  Refer to OA plan for pain plan.  Return in 6 weeks for follow-up.

## 2019-07-30 NOTE — Telephone Encounter (Signed)
Pt called to request rx for Trazadone. Stated her restless leg has been keeping her up at night and her sister who is also a pt of Jolene's was put on Trazadone and it has made a huge difference. She would like to try this as well. Please advise.  Callback Blackburn, [place] Phone:  [phone]  Fax:  [phone]

## 2019-07-30 NOTE — Assessment & Plan Note (Signed)
Dorsal aspect left hand.  Aprrox 1/2 cm and mobile.  No tenderness or s/s infection.  At this time will watchful wait.  If any signs or symptoms infection present OR changes in size/shape then will refer to general surgery for assessment.

## 2019-07-30 NOTE — Patient Instructions (Signed)

## 2019-07-30 NOTE — Assessment & Plan Note (Addendum)
Chronic, for months right knee with no further benefit from Meloxicam and new discomfort left knee.  Plan: - Continue Meloxicam as needed - Lidocaine patches, Voltaren gel, Tylenol, ice/heat as needed at home - Wear knee brace for support/comfort - Referral to physical therapy - Referral to orthopedics - Would benefit from modest weight loss Return for worsening or continued pain, could consider steroid injection in office.

## 2019-07-31 ENCOUNTER — Other Ambulatory Visit: Payer: Self-pay | Admitting: Nurse Practitioner

## 2019-08-03 ENCOUNTER — Other Ambulatory Visit: Payer: Self-pay | Admitting: Nurse Practitioner

## 2019-08-03 ENCOUNTER — Telehealth: Payer: Self-pay | Admitting: Nurse Practitioner

## 2019-08-03 MED ORDER — TRAZODONE HCL 50 MG PO TABS: 25.0000 mg | ORAL_TABLET | Freq: Every evening | ORAL | 3 refills | Status: DC | PRN

## 2019-08-03 NOTE — Telephone Encounter (Signed)
Resent this through this morning.

## 2019-08-03 NOTE — Telephone Encounter (Signed)
Patient is calling because Cannady, Barbaraann Faster, NP PCP - General Was going to prescripe the patient trazadone. It has not been sent to the [address]. [place] [phone]

## 2019-08-03 NOTE — Telephone Encounter (Signed)
Called pt let her know that rx has been sent, pt verbalized understanding.

## 2019-08-06 DIAGNOSIS — M170 Bilateral primary osteoarthritis of knee: Secondary | ICD-10-CM | POA: Diagnosis not present

## 2019-08-07 DIAGNOSIS — M170 Bilateral primary osteoarthritis of knee: Secondary | ICD-10-CM | POA: Diagnosis not present

## 2019-08-13 DIAGNOSIS — M170 Bilateral primary osteoarthritis of knee: Secondary | ICD-10-CM | POA: Diagnosis not present

## 2019-08-14 DIAGNOSIS — M170 Bilateral primary osteoarthritis of knee: Secondary | ICD-10-CM | POA: Diagnosis not present

## 2019-08-16 ENCOUNTER — Other Ambulatory Visit: Payer: Self-pay | Admitting: Nurse Practitioner

## 2019-08-16 DIAGNOSIS — Z1231 Encounter for screening mammogram for malignant neoplasm of breast: Secondary | ICD-10-CM

## 2019-08-20 ENCOUNTER — Other Ambulatory Visit: Payer: Self-pay | Admitting: Nurse Practitioner

## 2019-08-20 DIAGNOSIS — M170 Bilateral primary osteoarthritis of knee: Secondary | ICD-10-CM | POA: Diagnosis not present

## 2019-08-21 DIAGNOSIS — M170 Bilateral primary osteoarthritis of knee: Secondary | ICD-10-CM | POA: Diagnosis not present

## 2019-08-27 DIAGNOSIS — M170 Bilateral primary osteoarthritis of knee: Secondary | ICD-10-CM | POA: Diagnosis not present

## 2019-08-28 DIAGNOSIS — M170 Bilateral primary osteoarthritis of knee: Secondary | ICD-10-CM | POA: Diagnosis not present

## 2019-09-03 DIAGNOSIS — M1712 Unilateral primary osteoarthritis, left knee: Secondary | ICD-10-CM | POA: Diagnosis not present

## 2019-09-10 ENCOUNTER — Encounter: Payer: Self-pay | Admitting: Unknown Physician Specialty

## 2019-09-10 ENCOUNTER — Other Ambulatory Visit: Payer: Self-pay

## 2019-09-10 ENCOUNTER — Ambulatory Visit (INDEPENDENT_AMBULATORY_CARE_PROVIDER_SITE_OTHER): Payer: Medicare HMO | Admitting: Unknown Physician Specialty

## 2019-09-10 VITALS — BP 151/66 | HR 78 | Temp 97.9°F | Wt 250.8 lb

## 2019-09-10 DIAGNOSIS — G4733 Obstructive sleep apnea (adult) (pediatric): Secondary | ICD-10-CM | POA: Diagnosis not present

## 2019-09-10 DIAGNOSIS — M25842 Other specified joint disorders, left hand: Secondary | ICD-10-CM | POA: Diagnosis not present

## 2019-09-10 DIAGNOSIS — I10 Essential (primary) hypertension: Secondary | ICD-10-CM | POA: Diagnosis not present

## 2019-09-10 DIAGNOSIS — R69 Illness, unspecified: Secondary | ICD-10-CM | POA: Diagnosis not present

## 2019-09-10 DIAGNOSIS — F5104 Psychophysiologic insomnia: Secondary | ICD-10-CM | POA: Diagnosis not present

## 2019-09-10 NOTE — Assessment & Plan Note (Signed)
Pt is doing well with Trazadone.  However she snores.  She does not feel rested in the morning.  Will order sleep study.  Resources given CBT for sleep

## 2019-09-10 NOTE — Progress Notes (Signed)
BP (!) 151/66 (BP Location: Left Arm, Cuff Size: Normal)   Pulse 78   Temp 97.9 F (36.6 C) (Oral)   Wt 250 lb 12.8 oz (113.8 kg)   SpO2 99%   BMI 41.74 kg/m    Subjective:    Patient ID: Erika Anderson, female    DOB: [birth year], 69 y.o.   MRN: [id number]  HPI: Erika Anderson is a 69 y.o. female  Chief Complaint  Patient presents with  . Insomnia    6 week f.up   Insomnia Pt states she had trouble falling asleep.  Mind won't shut down.  Always up doing something.  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  Since taking Trazadone 50 mg this is better.  She does snore and wakes herself up snoring.  She has not had a sleep study done.    Cyst On back of hand.  2 months ago  Relevant past medical, surgical, family and social history reviewed and updated as indicated. Interim medical history since our last visit reviewed. Allergies and medications reviewed and updated.  Review of Systems  Constitutional: Negative.   Cardiovascular: Negative.   Genitourinary: Negative.   Psychiatric/Behavioral: Negative.     Per HPI unless specifically indicated above     Objective:    BP (!) 151/66 (BP Location: Left Arm, Cuff Size: Normal)   Pulse 78   Temp 97.9 F (36.6 C) (Oral)   Wt 250 lb 12.8 oz (113.8 kg)   SpO2 99%   BMI 41.74 kg/m   Wt Readings from Last 3 Encounters:  09/10/19 250 lb 12.8 oz (113.8 kg)  04/09/19 256 lb (116.1 kg)  01/19/19 246 lb (111.6 kg)    Physical Exam Constitutional:      General: She is not in acute distress.    Appearance: Normal appearance. She is well-developed.  HENT:     Head: Normocephalic and atraumatic.  Eyes:     General: Lids are normal. No scleral icterus.       Right eye: No discharge.        Left eye: No discharge.     Conjunctiva/sclera: Conjunctivae normal.  Cardiovascular:     Rate and Rhythm: Normal rate.  Pulmonary:     Effort: Pulmonary effort is normal.  Abdominal:     Palpations: There  is no hepatomegaly or splenomegaly.  Musculoskeletal:        General: Normal range of motion.  Skin:    Coloration: Skin is not pale.     Findings: No rash.  Neurological:     Mental Status: She is alert and oriented to person, place, and time.  Psychiatric:        Behavior: Behavior normal.        Thought Content: Thought content normal.        Judgment: Judgment normal.    Lesion: Back of hand round lesion freely movable underneath skin  Results for orders placed or performed in visit on 01/19/19  CBC with Differential/Platelet  Result Value Ref Range   WBC 7.0 3.4 - 10.8 x10E3/uL   RBC 4.61 3.77 - 5.28 x10E6/uL   Hemoglobin 13.9 11.1 - 15.9 g/dL   Hematocrit 42.4 34.0 - 46.6 %   MCV 92 79 - 97 fL   MCH 30.2 26.6 - 33.0 pg   MCHC 32.8 31.5 - 35.7 g/dL   RDW 13.5 11.7 - 15.4 %   Platelets 246 150 - 450 x10E3/uL   Neutrophils  49 Not Estab. %   Lymphs 39 Not Estab. %   Monocytes 8 Not Estab. %   Eos 2 Not Estab. %   Basos 1 Not Estab. %   Neutrophils Absolute 3.5 1.4 - 7.0 x10E3/uL   Lymphocytes Absolute 2.8 0.7 - 3.1 x10E3/uL   Monocytes Absolute 0.6 0.1 - 0.9 x10E3/uL   EOS (ABSOLUTE) 0.1 0.0 - 0.4 x10E3/uL   Basophils Absolute 0.1 0.0 - 0.2 x10E3/uL   Immature Granulocytes 1 Not Estab. %   Immature Grans (Abs) 0.0 0.0 - 0.1 x10E3/uL  Comprehensive metabolic panel  Result Value Ref Range   Glucose 93 65 - 99 mg/dL   BUN 20 8 - 27 mg/dL   Creatinine, Ser 0.80 0.57 - 1.00 mg/dL   GFR calc non Af Amer 77 >59 mL/min/1.73   GFR calc Af Amer 88 >59 mL/min/1.73   BUN/Creatinine Ratio 25 12 - 28   Sodium 143 134 - 144 mmol/L   Potassium 4.3 3.5 - 5.2 mmol/L   Chloride 104 96 - 106 mmol/L   CO2 25 20 - 29 mmol/L   Calcium 9.7 8.7 - 10.3 mg/dL   Total Protein 6.7 6.0 - 8.5 g/dL   Albumin 4.5 3.8 - 4.8 g/dL   Globulin, Total 2.2 1.5 - 4.5 g/dL   Albumin/Globulin Ratio 2.0 1.2 - 2.2   Bilirubin Total 0.3 0.0 - 1.2 mg/dL   Alkaline Phosphatase 93 39 - 117 IU/L   AST 26  0 - 40 IU/L   ALT 37 (H) 0 - 32 IU/L  Lipid Panel w/o Chol/HDL Ratio  Result Value Ref Range   Cholesterol, Total 243 (H) 100 - 199 mg/dL   Triglycerides 196 (H) 0 - 149 mg/dL   HDL 55 >39 mg/dL   VLDL Cholesterol Cal 39 5 - 40 mg/dL   LDL Calculated 149 (H) 0 - 99 mg/dL  TSH  Result Value Ref Range   TSH 1.380 0.450 - 4.500 uIU/mL      Assessment & Plan:   Problem List Items Addressed This Visit      Unprioritized   Insomnia    Pt is doing well with Trazadone.  However she snores.  She does not feel rested in the morning.  Will order sleep study.  Resources given CBT for sleep       Other Visit Diagnoses    Obstructive sleep apnea syndrome    -  Primary   Refer for sleep study   Relevant Orders   Ambulatory referral to Sleep Studies   Hypertension, unspecified type       High today but not typical.  Recheck at physical.  Encouraged to check home BPs   Cyst of joint of hand, left       Will schedule for removal       Follow up plan: Return f/u for cyst removal and for physical.  Order CPAP if OSA positive.

## 2019-09-10 NOTE — Patient Instructions (Signed)
CBTforInsomnia.com Information source: Delilah Shan. Ardis Hughs, PhD  Launched: 2005  Created by: Ardis Hughs, a CBT-I and sleep specialist, based on his 20 years of research and clinical practice in sleep medicine at [facility].  Development: CBTforInsomnia.com was developed from [place]' research (see "Peer-reviewed research" below), which was funded by the [employer] and demonstrated that a similar CBT-I program was more effective than Ambien.  Peer-reviewed research: Ardis Hughs GD, Pace-Schott E, York Spaniel. Cognitive-behavioral therapy and pharmacotherapy for insomnia: a randomized controlled trial and direct comparison. Arch Intern Med. [phone].  Because CBTforInsomnia.com is a Geologist, engineering, it cannot be subjected to controlled research, and there are no peer-reviewed articles on this specific program.  Effectiveness: A systematic outcome analysis on the program demonstrates the following results: 85% report improved sleep or a reduction in sleeping pills; 80% report a reduction in sleeping pills, averaging at least 66% fewer pills, and 40% stop sleeping pills altogether; 70% report an increase in total sleep time that averages one additional hour of sleep per night; 85% report a reduction in the number of insomnia nights that averages at least 70% fewer insomnia nights (from almost 6 nights per week at the start of the program to less than 2 nights per week 4 weeks later).  In 9 years of business, only two customers have requested a refund.  Referrals typically from: We do not collect that data, but it is a combination of both physician referrals and self-referrals.  Price: $34.95 per user (for permanent use)  Additional information: This CBT-I program provides weekly personalized feedback and CBT guidelines directly from a recognized CBT-I expert, contains techniques for reducing sleep medications, and offers a simple, easy-to-use format for a  nominal price.  CBT-i Coach CBTiCoach  Information source: Royston Sinner, PsyD, Runner, broadcasting/film/video, Mobile Health in [employer], [place] [facility] [place]  Launched: June 2013  Created by: CBT-i Coach represents a collaboration between the [employer] for PTSD, [employer], and the Department of [employer] for [employer]. The team was led by [facility]. Subject matter experts with deep scientific credibility in the field of insomnia led by Anselm Jungling, PhD, at [facility], as well as those responsible for the national rollout of this evidence-based practice in the [place] system, contributed at every stage of development. Implementation in existing clinical practices was considered continuously throughout design and development of the mobile app, and to this end, pilot studies with expert CBT-I clinicians in [place] settings have been undertaken to improve the product iteratively.  Development: CBT-i Coach is based on the therapy manual, Cognitive Behavioral Therapy for Insomnia in Veterans by Anselm Jungling, PhD, Sharlyn Bologna, PhD, and Wyonia Hough, PhD, et al.  Peer-reviewed research: Studies underway at multiple [place] sites.  Effectiveness: This product is not intended to be used independently. It is an adjunct to typical face-to-face care and to be used in conjunction with a skilled clinician who is trained in CBT-I. This app decreases barriers to successful implementation of a well-accepted treatment protocol and supports increased efficiency of care as it enables patients and providers with a few key features beyond traditional care: (1) CBT-I requires constant attention to sleep behaviors and regular accurate reporting on these in order to enable the algorithm-based aspects of the intervention. CBT-i Coach provides simpler, more accurate, and more discreet ways to collect and use the various elements of data  necessary to the success of this treatment. (2) CBT-I  clinicians are required to do various complex calculations at the start of sessions in order to utilize this data effectively. CBT-i Coach automatically handles these calculations, which reserves time for more valuable patient-provider interactions. (3) CBT-I requires that patients remember various behaviors to practice between sessions for optimal outcomes. CBT-i Coach provides instant and time-independent access to these tools, as well as extra information to support their usage.  Referrals typically from: Of the 30,000 users of CBT-i Coach, most come from physician referrals. A significant proportion of users appear to find this product through online/app store search.  Price: Free (veterans and civilians can download the app)  Additional information: The primary difference between [employer] and the various alternative digital options that exist is that CBT-i Coach is intended to be used within the context of traditional face-to-face treatment in conjunction with a trained healthcare provider. An integrated healthcare system like the [place] is uniquely suited to enable this kind of thorough approach to care given its direct connection with healthcare providers, its opportunities to train them in evidence-based psychotherapies, and its solid technical infrastructure.  [employer]' RESTORE RestoreCBTI  Information source: Angelia Mould, senior Government social research officer, Firefighter  Launched: 2006 in [place]; 2011 in the [place] by: Porfirio Mylar, PhD, [employer]  Development: RESTORE is a widely studied sleep improvement program. The paper "Logging on for Better Sleep: RCT of the Effectiveness of Online Treatment for Insomnia," published in SLEEP, reports on a randomized controlled trial and was published more than 5 years ago.  Peer-reviewed research: Wayna Chalet, Piotrowksi A, [place] of care for psychology in [place] [facility]. Can Psychol. 2012;53:165-177.Marland Kitchen[address], [place], [place] K. Attrition and adherence in the online treatment of chronic insomnia. Behav Sleep Med. 2010;8(3):141-50.Marland KitchenHolmqvist M, Vincent N, Walsh K. Web- vs telehealth-based delivery of cognitive behavioral therapy for insomnia: a randomized controlled trial. Sleep Med. 2013;15(2):187-195.Marland KitchenKeith Rake Logging on for better sleep: randomized controlled trial of the effectiveness of online treatment for insomnia. Sleep. 2009;32(6):807-15.Marcille Buffy, Fransisco Beau M. Logging on for nodding off: Empowering patients through the use of computerized cognitive behavioural therapy (cCBT). The Behavior Therapist. 2009;32:123-126.Marland KitchenJudi Saa S. Sleep locus of control and computerized cognitive-behavioral therapy (cCBT). Behav Res Ther. 2010;48(8):779-783.Madison Hickman  Stepped care for insomnia: an evaluation of implementation in routine practice. J Clin Sleep Med. 2013;9(3):227-234.Marland KitchenJudi Saa S. Determinants of success for computerized CBT: Examination of an insomnia program. Behav Sleep Med. 2013;11:1-13.Madison Hickman Hyperarousal, sleep scheduling, and time in bed as mediators of outcome in computerized cognitive-behavioral therapy (cCBT). Behav Res Ther. 9562;13:086-578.  Effectiveness: Studies have shown that more than 80% of those who try the program improve and show significant improvement in the time it takes to fall asleep, the time they are awake in the middle of the night, the amount of sleep they get, and their productivity at work.  Referrals typically from: Clinicians, employers, and insurers.  Price: Varies based on use case and situation  Additional information: RESTORE has been studied in individuals with many common comorbid conditions including those with sleep apnea and restless legs. It is available in  [place] and [place]. It is also available with other well-studied programs for anxiety, depression, and other common comorbid problems.  SHUTi SHUTi  Information source: Mary B. Pauline Aus, VP Nurse, learning disability, and Lamount Cranker, PhD, Biomedical scientist  Launched: [employer]  offering in March 2012 when [employer] licensed Tidioute from the [place] by: Four clinical researchers: Rubin Payor, PhD; Lamount Cranker, PhD; Kaylyn Layer, PhD; and Ballard Russell, PhD. Carolynn Serve is ranked as an insomnia expert with expertise in CBT-I.  Development: Originally informed by many years of Morin's work with insomnia and CBT for insomnia (sample publications below), as well as Ritterband's formative work Armed forces logistics/support/administrative officer interventions and providing a model for how to develop and evaluate Internet interventions.  Morin CM, Culbert JP, [employer]. Nonpharmacological interventions for insomnia: a meta-analysis of treatment efficacy. Am J Psychiatry. 1994;151(8):1172-1180.Marland KitchenMarland KitchenMorin CM, Colecchi C, Stone J, Sood R, Brink D. Behavioral and pharmacological therapies for late-life insomnia, a randomized controlled trial. JAMA. 1999;281(11):991-999.Carolynn Serve CM, Bastien CH, Vallieres A. Validation of the Insomnia Severity Index as an outcome measure for insomnia research. Sleep Med. 2001;2(4):297-307.Marland KitchenRitterband LM, Gonder-Frederick LA, Cox DJ, [place] AD, [place], [place]. Internet interventions: In review, in use, and into the future. Professional Psychology: Lexicographer. 2003;34(5):527-534.Marland KitchenRitterband LM, Thorndike FP, Cox DJ, Kovatchev BP, Gonder-Frederick L. A behavior change model for Internet interventions. [place] 2009;38(1):18-27.  Peer-reviewed research: Lorane Gell FP, Saylor DK, Bailey ET, Gonder-Frederick LA, Morin CM, Ritterband LM. Development and perceived utility and impact of an Internet intervention for insomnia. E-Journal of Lexicographer.  2008;4(2):32-42.Marland KitchenRitterband LM, Thorndike FP, Gonder-Frederick L, et al. Efficacy of an Internet-based behavioral intervention for adults with insomnia. Arch Gen Psychiatry. 8101;75:102-585..Ritterband LM, Thorndike FP, Gonder-Frederick L, et al. Can cognitive behavioral therapy of insomnia be effectively provided via an Internet-based program? Best of Sleep Medicine. 2009, 2010.Marland KitchenRitterband LM, Ashok Norris, Thorndike FP, Lord HR, Marlow, Virgil LD.  Initial evaluation of an Internet intervention to improve the sleep of cancer survivors with insomnia. Psychooncology. 2012;21(7):695-705.Marland KitchenThorndike FP, Ritterband LM, Morin CA, et al. Randomized controlled trial of an Internet intervention for adults with insomnia: effects on comorbid psychological and fatigue symptoms. J Clin Psychol. 2013;69(10):1078-1093.  Effectiveness: The evidence base suggests that SHUTi is effective for the vast majority of users. Of the most recent 500 users, over 70% of users who started the program either fully completed the program or met criteria for mild or no clinical insomnia based on the Insomnia Severity Index at their last log-in.  Referrals typically from: Distributed equally between physician referral and self-referral.  Price: Clinicians can join the [place] Partner Network to gain their patients a 15% discount to the published $156 subscription rate for [employer], which enables patients to grant their referring physician online access to view and monitor their progress with SHUTi; otherwise, $135 one-time fee per user in the self-help program.  Additional information: SHUTi is recommended by over 135 leading sleep clinics. It has an exclusive partnership with [place], the network of sleep providers founded by the [employer] of Sleep Medicine. SHUTi was also reviewed by an independent expert panel and selected for inclusion in the National Cancer Institute's Research-tested Intervention  Programs (RTIPs). Clinicians have also voiced support of the Clinical Partner feature whereby they can view their patient's progress (program usage and sleep diary information). Continued evaluation of SHUTi in different research trials adds to its credibility among clinicians.  Sleepio [employer]  Information source: Rayburn Go, Teacher, English as a foreign language and cofounder  Launched: 2012  Created by: Co-created by insomnia suffer Rayburn Go and sleep expert Marcell Barlow, PhD. Sudie Bailey holds a master's degree in experimental psychology from the [employer] and overcame his insomnia using techniques refined by [employer]. His difficulty in getting access to  proven nondrug techniques inspired him to create Sleepio. Espie, clinical and Physicist, medical and professor at the [employer], has 200 published scientific papers.  Development: Sleepio was developed through the huge body of existing literature, as well as Espie's 30 years of clinical and research experience.  Peer-reviewed research: Neldon Mc SD, Ivonne Andrew, et al. A randomized, placebo-controlled, trial of online cognitive behavioral therapy for chronic insomnia disorder delivered via an automated media-rich web application. SLEEP. 2012;3:769-781.  Effectiveness: For retail consumers, we offer a 100% money back guarantee.  Referrals typically from: A number of channels-from searching on Google to physician referral.  Price: $1,000 per month for unlimited subscriptions for your patients; alternately, a 30% discount off the listed price of $149 per user for 12 weeks' access and $249 per user for 1 year's access is available to healthcare providers for referred patients  Additional information: Clinical excellence; world-leading clinical team ([place]); gold standard evidence (placebo-group RCT); designed to feel more like entertainment than medicine; the whole program is presented by a virtual  animated sleep expert, The Prof, and his narcoleptic dog, Product manager; exceptional ease of use for users of any age; integration with sleep trackers (Jawbone UP, Fitbit) to automatically tailor the program using tracked sleep data; proactive and supportive online community of other users; Advertising copywriter [facility]) that allows healthcare professionals to remotely monitor their patients as they move through the program.  Programmer, multimedia  Information source: Renae Fickle, VP of sales and business development, [phone]  Launched: 2012  Created by: Linton Flemings is based on [facility], which was developed by Coralyn Helling, PhD, a leading insomnia expert and faculty member of the [place]: Lou Miner coauthored several papers about insomnia treatment and online CBT-I treatment (see "peer-reviewed research" below).  Peer-reviewed research: Henderson Baltimore, Eduard Clos Online treatment of insomnia using behavioral techniques. Sleep Diagnosis and Therapy. 2010;5(3):28-32..See additional studies at www.sleeptutor.com/_content/about-sleep-tutor.asp (under "validation").  Effectiveness: Money back guarantee up to 14 days after initial enrollment.  Referrals typically from: Healthcare professionals only.  Price: $149 per user for 8 sessions; quantity discounts apply for providers willing to commit to a large number of patients  Additional information: Since its launch in 2005, Somnio has helped thousands of patients.  During his research for this article, [place] administrator. Jolinda Croak was gratified to learn that there are nonpharmaceutical options for treating insomnia and, particularly, that these treatments can be easily accessible to most patient populations.

## 2019-09-18 ENCOUNTER — Ambulatory Visit
Admission: RE | Admit: 2019-09-18 | Discharge: 2019-09-18 | Disposition: A | Discharge status: Home / Self Care | Payer: Medicare HMO | Source: Ambulatory Visit | Attending: Nurse Practitioner | Admitting: Nurse Practitioner

## 2019-09-18 DIAGNOSIS — Z1231 Encounter for screening mammogram for malignant neoplasm of breast: Secondary | ICD-10-CM | POA: Diagnosis not present

## 2019-09-18 IMAGING — MG DIGITAL SCREENING BILAT W/ TOMO W/ CAD
8 series · 8 of 24 positions shown · non-contrast
Comparison: Previous exam(s).

CLINICAL DATA: Screening.

EXAM:
DIGITAL SCREENING BILATERAL MAMMOGRAM WITH TOMO AND CAD

[L MLO synth-2D]
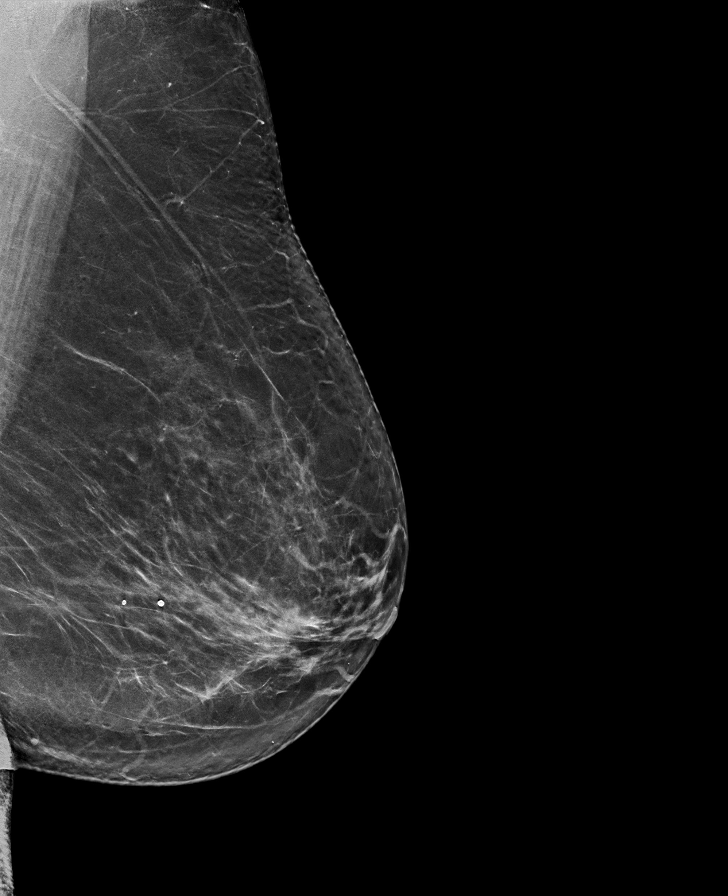

[R CC synth-2D]
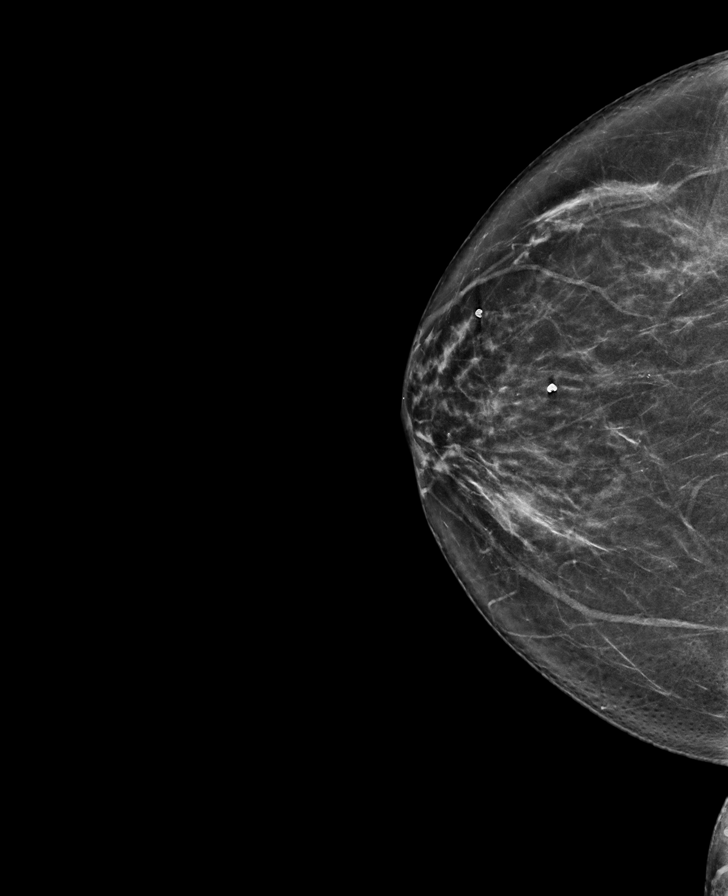

[L CC synth-2D]
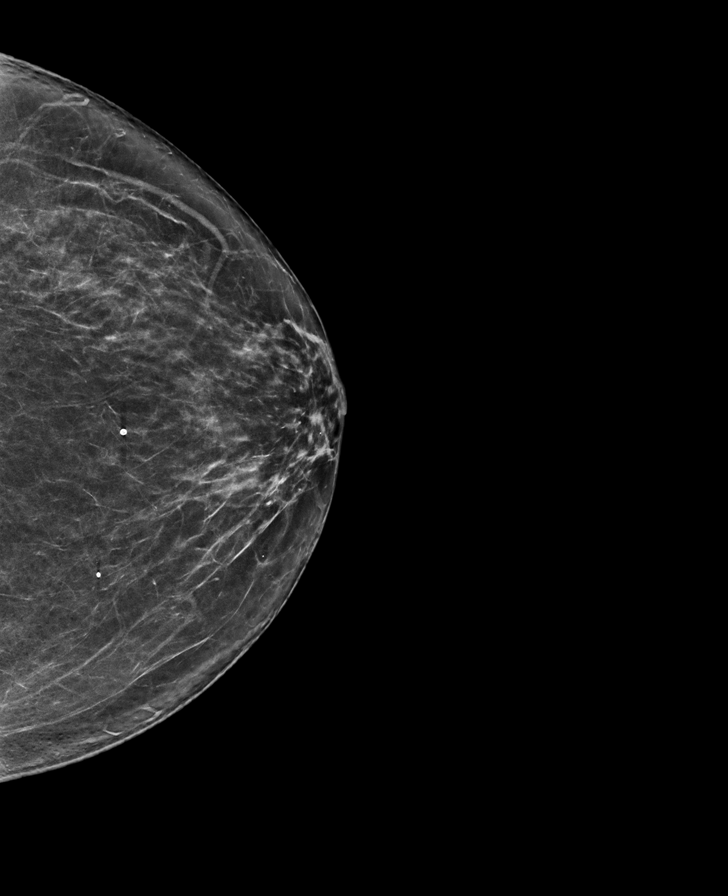

[R MLO synth-2D]
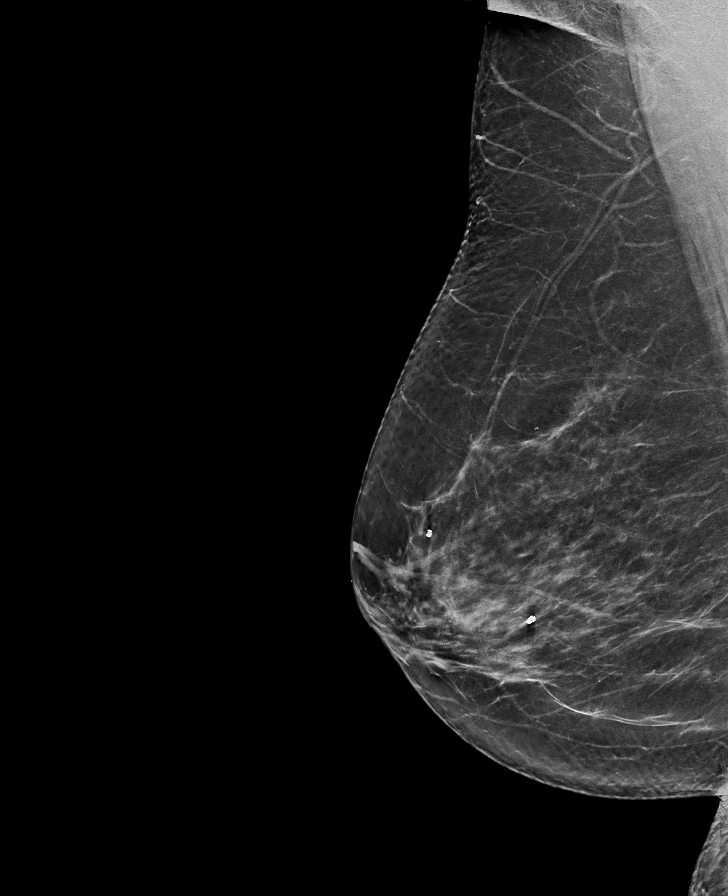

[L MLO tomo · tomo slice 41/82.0]
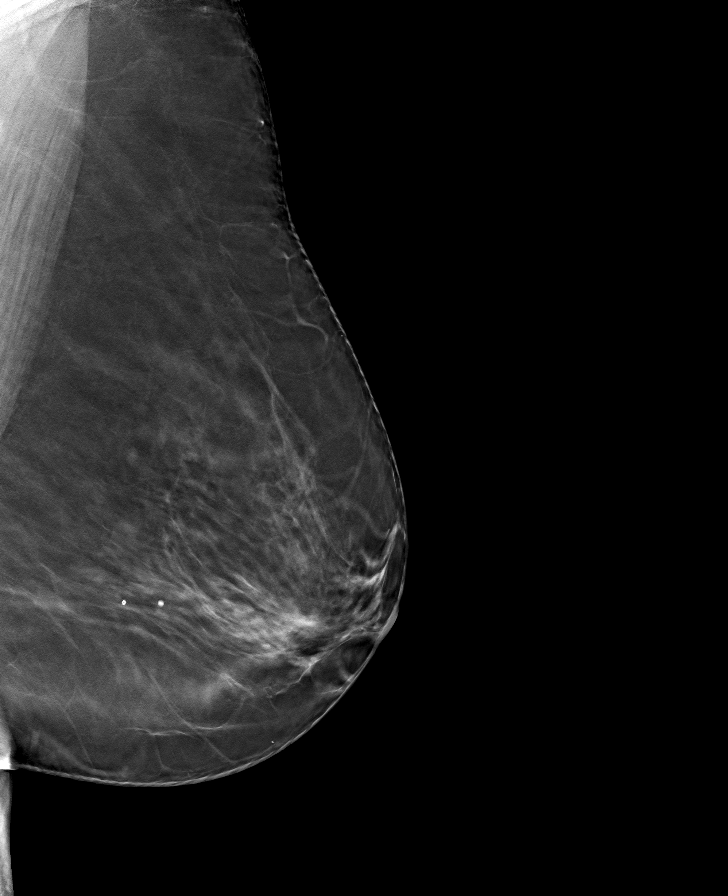

[L CC tomo · tomo slice 37/74.0]
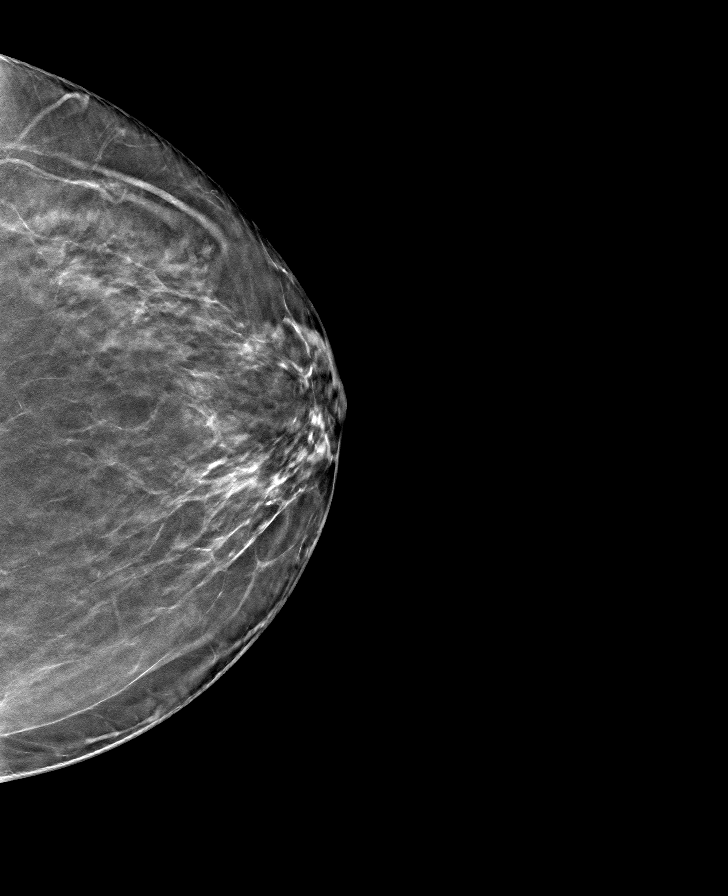

[R MLO tomo · tomo slice 40/79.0]
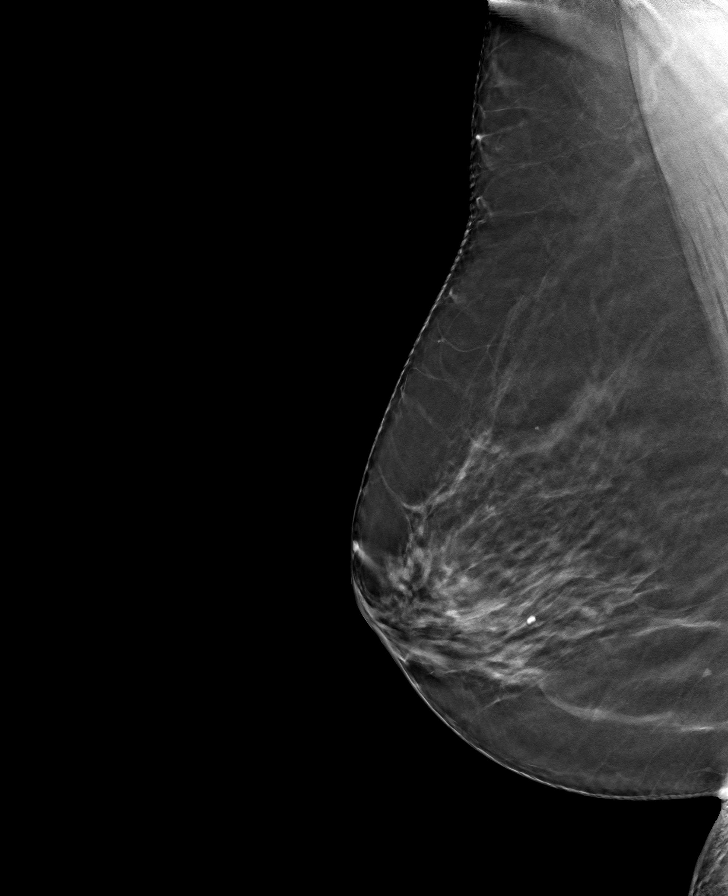

[R CC tomo · tomo slice 39/76.0]
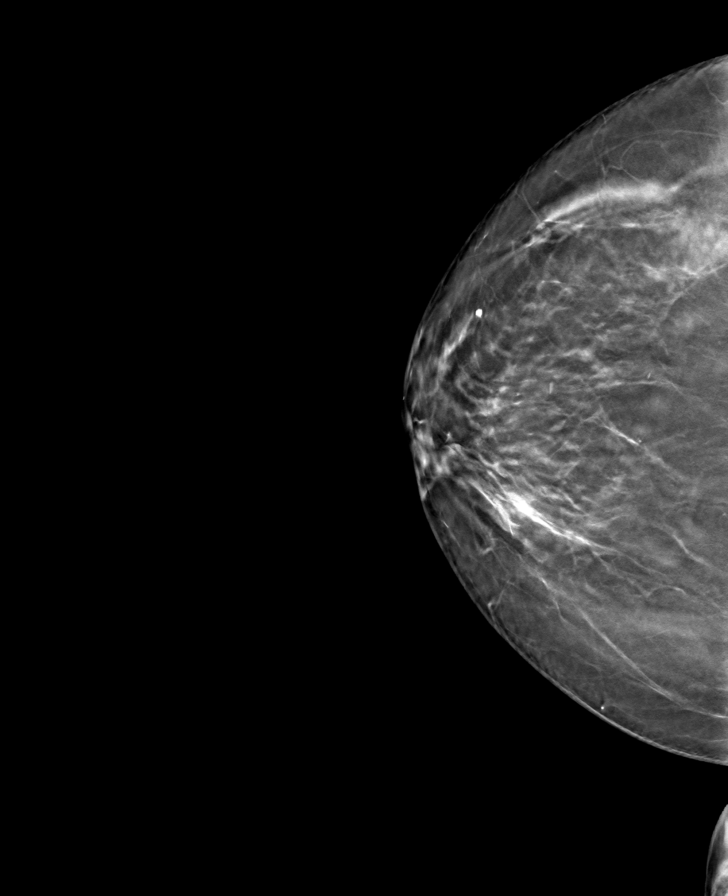

[8 of 24 positions shown; findings below may reference images not displayed]

ACR Breast Density Category b: There are scattered areas of
fibroglandular density.
FINDINGS: There are no findings suspicious for malignancy. Images were
processed with CAD.
IMPRESSION: No mammographic evidence of malignancy. A result letter of this
screening mammogram will be mailed directly to the patient.

RECOMMENDATION:
Screening mammogram in one year. (Code:[TQ])

BI-RADS CATEGORY  1: Negative.

## 2019-09-19 ENCOUNTER — Other Ambulatory Visit: Payer: Self-pay | Admitting: Nurse Practitioner

## 2019-09-19 NOTE — Telephone Encounter (Signed)
Requested Prescriptions  Pending Prescriptions Disp Refills  . meloxicam (MOBIC) 7.5 MG tablet [Pharmacy Med Name: Meloxicam 7.5 MG Oral Tablet] 90 tablet 0    Sig: TAKE 1 TABLET BY MOUTH ONCE DAILY AS NEEDED FOR PAIN     Analgesics:  COX2 Inhibitors Passed - 09/19/2019  4:30 PM      Passed - HGB in normal range and within 360 days    Hemoglobin  Date Value Ref Range Status  01/19/2019 13.9 11.1 - 15.9 g/dL Final         Passed - Cr in normal range and within 360 days    Creatinine, Ser  Date Value Ref Range Status  01/19/2019 0.80 0.57 - 1.00 mg/dL Final         Passed - Patient is not pregnant      Passed - Valid encounter within last 12 months    Recent Outpatient Visits          1 week ago Obstructive sleep apnea syndrome   [facility] Kathrine Haddock, NP   1 month ago Primary osteoarthritis of both knees   [place], [place]co T, NP   4 months ago Primary osteoarthritis of right knee   [place], [place] T, NP   5 months ago Acute pain of right knee   [place] Cannady, Barbaraann Faster, NP   8 months ago Encounter for annual physical exam   [place], Barbaraann Faster, NP      Future Appointments            In 4 months Cannady, Barbaraann Faster, NP [employer]

## 2019-10-01 DIAGNOSIS — M1712 Unilateral primary osteoarthritis, left knee: Secondary | ICD-10-CM | POA: Diagnosis not present

## 2019-10-19 DIAGNOSIS — M1712 Unilateral primary osteoarthritis, left knee: Secondary | ICD-10-CM | POA: Diagnosis not present

## 2019-10-26 DIAGNOSIS — M1712 Unilateral primary osteoarthritis, left knee: Secondary | ICD-10-CM | POA: Diagnosis not present

## 2019-11-02 DIAGNOSIS — M1712 Unilateral primary osteoarthritis, left knee: Secondary | ICD-10-CM | POA: Diagnosis not present

## 2019-11-16 ENCOUNTER — Other Ambulatory Visit: Payer: Self-pay | Admitting: Nurse Practitioner

## 2019-12-03 ENCOUNTER — Telehealth: Payer: Self-pay | Admitting: Nurse Practitioner

## 2019-12-03 NOTE — Telephone Encounter (Signed)
Copied from [place] [phone]. Topic: Appointment Scheduling - Transfer of Care >> Dec 03, 2019  8:50 AM Celene Kras wrote: Pt is requesting to transfer FROM: Erika Anderson  Pt is requesting to transfer TO: Erika Anderson  Reason for requested transfer: Pt called stating that her insurance is not in network with [name] anymore. Please advise.   Send CRM to patient's current PCP (transferring FROM).  Called pt stated she received a letter with the information we no longer was in network.I told her practice admin would call.

## 2019-12-06 ENCOUNTER — Other Ambulatory Visit: Payer: Self-pay | Admitting: Nurse Practitioner

## 2019-12-06 DIAGNOSIS — M2242 Chondromalacia patellae, left knee: Secondary | ICD-10-CM | POA: Diagnosis not present

## 2019-12-06 DIAGNOSIS — M2241 Chondromalacia patellae, right knee: Secondary | ICD-10-CM | POA: Diagnosis not present

## 2019-12-13 DIAGNOSIS — M25562 Pain in left knee: Secondary | ICD-10-CM | POA: Diagnosis not present

## 2019-12-13 DIAGNOSIS — M1712 Unilateral primary osteoarthritis, left knee: Secondary | ICD-10-CM | POA: Diagnosis not present

## 2019-12-14 ENCOUNTER — Encounter: Payer: Self-pay | Admitting: Nurse Practitioner

## 2019-12-14 ENCOUNTER — Telehealth (INDEPENDENT_AMBULATORY_CARE_PROVIDER_SITE_OTHER): Payer: Medicare HMO | Admitting: Nurse Practitioner

## 2019-12-14 DIAGNOSIS — N951 Menopausal and female climacteric states: Secondary | ICD-10-CM | POA: Diagnosis not present

## 2019-12-14 MED ORDER — CITALOPRAM HYDROBROMIDE 20 MG PO TABS: 20.0000 mg | ORAL_TABLET | Freq: Every day | ORAL | 3 refills | Status: DC

## 2019-12-14 NOTE — Progress Notes (Signed)
There were no vitals taken for this visit.   Subjective:    Patient ID: Erika Anderson, female    DOB: [birth year], 69 y.o.   MRN: [id number]  HPI: Erika Anderson is a 69 y.o. female  Chief Complaint  Patient presents with  . Hot Flashes    pt states she has been having hot flashed for a while but states they are to a point where she cannot handle them anymore. States she has tried a few OTc medications that have no really helped.     . This visit was completed via telephone due to the restrictions of the COVID-19 pandemic. All issues as above were discussed and addressed but no physical exam was performed. If it was felt that the patient should be evaluated in the office, they were directed there. The patient verbally consented to this visit. Patient was unable to complete an audio/visual visit due to Technical difficulties,Lack of internet. Due to the catastrophic nature of the COVID-19 pandemic, this visit was done through audio contact only. . Location of the patient: home . Location of the provider: work . Those involved with this call:  . Provider: Marnee Guarneri, DNP . CMA: Yvonna Alanis, CMA . Front Desk/Registration: Don Perking  . Time spent on call: 20 minutes on the phone discussing health concerns. 15 minutes total spent in review of patient's record and preparation of their chart.  . I verified patient identity using two factors (patient name and date of birth). Patient consents verbally to being seen via telemedicine visit today.    MENOPAUSAL SYMPTOMS Has been going on for one year and gotten worse over past 2 months. Has tried all the OTC medications and remedies.  She is currently taking Gabapentin for her knee.  She has taken HRT in past on review, discussed with her various options -- including Gabapentin which she is currently taking. Gravida/Para: 2/2 Duration: uncontrolled Symptom severity: moderate Hot flashes: yes Night sweats: yes Sleep  disturbances: yes Vaginal dryness: no Dyspareunia:no Decreased libido: no Emotional lability: no Stress incontinence: no Previous HRT/pharmacotherapy: no Hysterectomy: yes GYN surgery: hysterectomy several years ago Absolute Contraindications to Hormonal Therapy:     Undiagnosed vaginal bleeding: no    Breast cancer: no    Endometrial cancer: no -- her mother had ovarian cancer    Coronary disease: no    Cerebrovascular disease: no    Venous thromboembolic disease: no  Relevant past medical, surgical, family and social history reviewed and updated as indicated. Interim medical history since our last visit reviewed. Allergies and medications reviewed and updated.  Review of Systems  Constitutional: Negative for activity change, appetite change, diaphoresis, fatigue and fever.  Respiratory: Negative for cough, chest tightness and shortness of breath.   Cardiovascular: Negative for chest pain, palpitations and leg swelling.  Gastrointestinal: Negative.   Neurological: Negative.   Psychiatric/Behavioral: Negative.    Per HPI unless specifically indicated above     Objective:    There were no vitals taken for this visit.  Wt Readings from Last 3 Encounters:  09/10/19 250 lb 12.8 oz (113.8 kg)  04/09/19 256 lb (116.1 kg)  01/19/19 246 lb (111.6 kg)    Physical Exam   Unable to perform due to MyChart video not working, telephone visit only.  Results for orders placed or performed in visit on 01/19/19  CBC with Differential/Platelet  Result Value Ref Range   WBC 7.0 3.4 - 10.8 x10E3/uL   RBC 4.61 3.77 - 5.28  x10E6/uL   Hemoglobin 13.9 11.1 - 15.9 g/dL   Hematocrit 42.4 34.0 - 46.6 %   MCV 92 79 - 97 fL   MCH 30.2 26.6 - 33.0 pg   MCHC 32.8 31 - 35 g/dL   RDW 13.5 11.7 - 15.4 %   Platelets 246 150 - 450 x10E3/uL   Neutrophils 49 Not Estab. %   Lymphs 39 Not Estab. %   Monocytes 8 Not Estab. %   Eos 2 Not Estab. %   Basos 1 Not Estab. %   Neutrophils Absolute 3.5 1 -  7 x10E3/uL   Lymphocytes Absolute 2.8 0 - 3 x10E3/uL   Monocytes Absolute 0.6 0 - 0 x10E3/uL   EOS (ABSOLUTE) 0.1 0.0 - 0.4 x10E3/uL   Basophils Absolute 0.1 0 - 0 x10E3/uL   Immature Granulocytes 1 Not Estab. %   Immature Grans (Abs) 0.0 0.0 - 0.1 x10E3/uL  Comprehensive metabolic panel  Result Value Ref Range   Glucose 93 65 - 99 mg/dL   BUN 20 8 - 27 mg/dL   Creatinine, Ser 0.80 0.57 - 1.00 mg/dL   GFR calc non Af Amer 77 >59 mL/min/1.73   GFR calc Af Amer 88 >59 mL/min/1.73   BUN/Creatinine Ratio 25 12 - 28   Sodium 143 134 - 144 mmol/L   Potassium 4.3 3.5 - 5.2 mmol/L   Chloride 104 96 - 106 mmol/L   CO2 25 20 - 29 mmol/L   Calcium 9.7 8.7 - 10.3 mg/dL   Total Protein 6.7 6.0 - 8.5 g/dL   Albumin 4.5 3.8 - 4.8 g/dL   Globulin, Total 2.2 1.5 - 4.5 g/dL   Albumin/Globulin Ratio 2.0 1.2 - 2.2   Bilirubin Total 0.3 0.0 - 1.2 mg/dL   Alkaline Phosphatase 93 39 - 117 IU/L   AST 26 0 - 40 IU/L   ALT 37 (H) 0 - 32 IU/L  Lipid Panel w/o Chol/HDL Ratio  Result Value Ref Range   Cholesterol, Total 243 (H) 100 - 199 mg/dL   Triglycerides 196 (H) 0 - 149 mg/dL   HDL 55 >39 mg/dL   VLDL Cholesterol Cal 39 5 - 40 mg/dL   LDL Calculated 149 (H) 0 - 99 mg/dL  TSH  Result Value Ref Range   TSH 1.380 0.450 - 4.500 uIU/mL      Assessment & Plan:   Problem List Items Addressed This Visit      Other   Menopausal vasomotor syndrome    Ongoing with worsening over past two months, she is currently taking Gabapentin for her knee.  Discussed various options with her to include Gabapentin, Celexa, Paxil, Effexor, and HRT (which discussed is only short term use and not for long term).  Will trial Celexa 20 MG to start, script sent in.  Continue Gabapentin, which she is currently taking for her knee.  Recommend use of light clothing at home, regular exercise, healthy diet, and keeping home cooler.  Plan to return in 6 weeks for follow-up.  If HRT is needed would lean more towards patches vs  oral, which she is aware of and only for short term -- mammogram up to date.         I discussed the assessment and treatment plan with the patient. The patient was provided an opportunity to ask questions and all were answered. The patient agreed with the plan and demonstrated an understanding of the instructions.   The patient was advised to call back or seek an in-person  evaluation if the symptoms worsen or if the condition fails to improve as anticipated.   I provided 21+ minutes of time during this encounter.  Follow up plan: Return in about 6 weeks (around 01/25/2020) for Hot flashes.

## 2019-12-14 NOTE — Patient Instructions (Signed)
Menopause Menopause is the normal time of life when menstrual periods stop completely. It is usually confirmed by 12 months without a menstrual period. The transition to menopause (perimenopause) most often happens between the ages of 45 and 55. During perimenopause, hormone levels change in your body, which can cause symptoms and affect your health. Menopause may increase your risk for:  Loss of bone (osteoporosis), which causes bone breaks (fractures).  Depression.  Hardening and narrowing of the arteries (atherosclerosis), which can cause heart attacks and strokes. What are the causes? This condition is usually caused by a natural change in hormone levels that happens as you get older. The condition may also be caused by surgery to remove both ovaries (bilateral oophorectomy). What increases the risk? This condition is more likely to start at an earlier age if you have certain medical conditions or treatments, including:  A tumor of the pituitary gland in the brain.  A disease that affects the ovaries and hormone production.  Radiation treatment for cancer.  Certain cancer treatments, such as chemotherapy or hormone (anti-estrogen) therapy.  Heavy smoking and excessive alcohol use.  Family history of early menopause. This condition is also more likely to develop earlier in women who are very thin. What are the signs or symptoms? Symptoms of this condition include:  Hot flashes.  Irregular menstrual periods.  Night sweats.  Changes in feelings about sex. This could be a decrease in sex drive or an increased comfort around your sexuality.  Vaginal dryness and thinning of the vaginal walls. This may cause painful intercourse.  Dryness of the skin and development of wrinkles.  Headaches.  Problems sleeping (insomnia).  Mood swings or irritability.  Memory problems.  Weight gain.  Hair growth on the face and chest.  Bladder infections or problems with urinating. How  is this diagnosed? This condition is diagnosed based on your medical history, a physical exam, your age, your menstrual history, and your symptoms. Hormone tests may also be done. How is this treated? In some cases, no treatment is needed. You and your health care provider should make a decision together about whether treatment is necessary. Treatment will be based on your individual condition and preferences. Treatment for this condition focuses on managing symptoms. Treatment may include:  Menopausal hormone therapy (MHT).  Medicines to treat specific symptoms or complications.  Acupuncture.  Vitamin or herbal supplements. Before starting treatment, make sure to let your health care provider know if you have a personal or family history of:  Heart disease.  Breast cancer.  Blood clots.  Diabetes.  Osteoporosis. Follow these instructions at home: Lifestyle  Do not use any products that contain nicotine or tobacco, such as cigarettes and e-cigarettes. If you need help quitting, ask your health care provider.  Get at least 30 minutes of physical activity on 5 or more days each week.  Avoid alcoholic and caffeinated beverages, as well as spicy foods. This may help prevent hot flashes.  Get 7-8 hours of sleep each night.  If you have hot flashes, try: ? Dressing in layers. ? Avoiding things that may trigger hot flashes, such as spicy food, warm places, or stress. ? Taking slow, deep breaths when a hot flash starts. ? Keeping a fan in your home and office.  Find ways to manage stress, such as deep breathing, meditation, or journaling.  Consider going to group therapy with other women who are having menopause symptoms. Ask your health care provider about recommended group therapy meetings. Eating and   drinking  Eat a healthy, balanced diet that contains whole grains, lean protein, low-fat dairy, and plenty of fruits and vegetables.  Your health care provider may recommend  adding more soy to your diet. Foods that contain soy include tofu, tempeh, and soy milk.  Eat plenty of foods that contain calcium and vitamin D for bone health. Items that are rich in calcium include low-fat milk, yogurt, beans, almonds, sardines, broccoli, and kale. Medicines  Take over-the-counter and prescription medicines only as told by your health care provider.  Talk with your health care provider before starting any herbal supplements. If prescribed, take vitamins and supplements as told by your health care provider. These may include: ? Calcium. Women age 51 and older should get 1,200 mg (milligrams) of calcium every day. ? Vitamin D. Women need 600-800 International Units of vitamin D each day. ? Vitamins B12 and B6. Aim for 50 micrograms of B12 and 1.5 mg of B6 each day. General instructions  Keep track of your menstrual periods, including: ? When they occur. ? How heavy they are and how long they last. ? How much time passes between periods.  Keep track of your symptoms, noting when they start, how often you have them, and how long they last.  Use vaginal lubricants or moisturizers to help with vaginal dryness and improve comfort during sex.  Keep all follow-up visits as told by your health care provider. This is important. This includes any group therapy or counseling. Contact a health care provider if:  You are still having menstrual periods after age 55.  You have pain during sex.  You have not had a period for 12 months and you develop vaginal bleeding. Get help right away if:  You have: ? Severe depression. ? Excessive vaginal bleeding. ? Pain when you urinate. ? A fast or irregular heart beat (palpitations). ? Severe headaches. ? Abdomen (abdominal) pain or severe indigestion.  You fell and you think you have a broken bone.  You develop leg or chest pain.  You develop vision problems.  You feel a lump in your breast. Summary  Menopause is the normal  time of life when menstrual periods stop completely. It is usually confirmed by 12 months without a menstrual period.  The transition to menopause (perimenopause) most often happens between the ages of 45 and 55.  Symptoms can be managed through medicines, lifestyle changes, and complementary therapies such as acupuncture.  Eat a balanced diet that is rich in nutrients to promote bone health and heart health and to manage symptoms during menopause. This information is not intended to replace advice given to you by your health care provider. Make sure you discuss any questions you have with your health care provider. Document Revised: 05/06/2017 Document Reviewed: 06/26/2016 Elsevier Patient Education  2020 [employer]  

## 2019-12-14 NOTE — Assessment & Plan Note (Signed)
Ongoing with worsening over past two months, she is currently taking Gabapentin for her knee.  Discussed various options with her to include Gabapentin, Celexa, Paxil, Effexor, and HRT (which discussed is only short term use and not for long term).  Will trial Celexa 20 MG to start, script sent in.  Continue Gabapentin, which she is currently taking for her knee.  Recommend use of light clothing at home, regular exercise, healthy diet, and keeping home cooler.  Plan to return in 6 weeks for follow-up.  If HRT is needed would lean more towards patches vs oral, which she is aware of and only for short term -- mammogram up to date.

## 2019-12-18 DIAGNOSIS — M1712 Unilateral primary osteoarthritis, left knee: Secondary | ICD-10-CM | POA: Diagnosis not present

## 2019-12-18 DIAGNOSIS — M25562 Pain in left knee: Secondary | ICD-10-CM | POA: Diagnosis not present

## 2019-12-20 DIAGNOSIS — M25562 Pain in left knee: Secondary | ICD-10-CM | POA: Diagnosis not present

## 2019-12-20 DIAGNOSIS — M1712 Unilateral primary osteoarthritis, left knee: Secondary | ICD-10-CM | POA: Diagnosis not present

## 2019-12-25 DIAGNOSIS — M1712 Unilateral primary osteoarthritis, left knee: Secondary | ICD-10-CM | POA: Diagnosis not present

## 2019-12-25 DIAGNOSIS — M25562 Pain in left knee: Secondary | ICD-10-CM | POA: Diagnosis not present

## 2020-01-03 DIAGNOSIS — M25562 Pain in left knee: Secondary | ICD-10-CM | POA: Diagnosis not present

## 2020-01-03 DIAGNOSIS — M1712 Unilateral primary osteoarthritis, left knee: Secondary | ICD-10-CM | POA: Diagnosis not present

## 2020-01-17 ENCOUNTER — Encounter: Payer: Self-pay | Admitting: Nurse Practitioner

## 2020-01-17 DIAGNOSIS — I700 Atherosclerosis of aorta: Secondary | ICD-10-CM | POA: Insufficient documentation

## 2020-01-17 DIAGNOSIS — Z6841 Body Mass Index (BMI) 40.0 and over, adult: Secondary | ICD-10-CM | POA: Insufficient documentation

## 2020-01-18 ENCOUNTER — Other Ambulatory Visit: Payer: Self-pay | Admitting: Nurse Practitioner

## 2020-01-18 DIAGNOSIS — G2581 Restless legs syndrome: Secondary | ICD-10-CM

## 2020-01-21 ENCOUNTER — Other Ambulatory Visit: Payer: Self-pay | Admitting: Nurse Practitioner

## 2020-01-22 ENCOUNTER — Encounter: Payer: Self-pay | Admitting: Nurse Practitioner

## 2020-01-24 ENCOUNTER — Ambulatory Visit (INDEPENDENT_AMBULATORY_CARE_PROVIDER_SITE_OTHER): Payer: Medicare HMO | Admitting: Nurse Practitioner

## 2020-01-24 ENCOUNTER — Other Ambulatory Visit: Payer: Self-pay

## 2020-01-24 ENCOUNTER — Encounter: Payer: Self-pay | Admitting: Nurse Practitioner

## 2020-01-24 VITALS — BP 126/75 | HR 64 | Temp 98.1°F | Ht 65.1 in | Wt 249.6 lb

## 2020-01-24 DIAGNOSIS — F5104 Psychophysiologic insomnia: Secondary | ICD-10-CM

## 2020-01-24 DIAGNOSIS — I700 Atherosclerosis of aorta: Secondary | ICD-10-CM | POA: Diagnosis not present

## 2020-01-24 DIAGNOSIS — E559 Vitamin D deficiency, unspecified: Secondary | ICD-10-CM | POA: Diagnosis not present

## 2020-01-24 DIAGNOSIS — E7800 Pure hypercholesterolemia, unspecified: Secondary | ICD-10-CM

## 2020-01-24 DIAGNOSIS — R69 Illness, unspecified: Secondary | ICD-10-CM | POA: Diagnosis not present

## 2020-01-24 DIAGNOSIS — G2581 Restless legs syndrome: Secondary | ICD-10-CM

## 2020-01-24 DIAGNOSIS — Z6841 Body Mass Index (BMI) 40.0 and over, adult: Secondary | ICD-10-CM | POA: Diagnosis not present

## 2020-01-24 DIAGNOSIS — Z0000 Encounter for general adult medical examination without abnormal findings: Secondary | ICD-10-CM | POA: Diagnosis not present

## 2020-01-24 DIAGNOSIS — N951 Menopausal and female climacteric states: Secondary | ICD-10-CM | POA: Diagnosis not present

## 2020-01-24 MED ORDER — ROPINIROLE HCL 1 MG PO TABS: 1.0000 mg | ORAL_TABLET | Freq: Four times a day (QID) | ORAL | 4 refills | Status: DC

## 2020-01-24 MED ORDER — TRAZODONE HCL 50 MG PO TABS: ORAL_TABLET | ORAL | 4 refills | Status: DC

## 2020-01-24 MED ORDER — MELOXICAM 7.5 MG PO TABS: 7.5000 mg | ORAL_TABLET | Freq: Every day | ORAL | 4 refills | Status: DC

## 2020-01-24 MED ORDER — CITALOPRAM HYDROBROMIDE 20 MG PO TABS: 20.0000 mg | ORAL_TABLET | Freq: Every day | ORAL | 4 refills | Status: DC

## 2020-01-24 NOTE — Assessment & Plan Note (Signed)
Improved, secondary to OA and RLS.  Will continue Trazodone as needed, refills sent in for 25-50 MG PRN QHS.  May benefit both sleep and mood with current pain issues.  Declined sleep study at this time.  Return in 6 months.

## 2020-01-24 NOTE — Assessment & Plan Note (Signed)
Improved with Celexa 20 MG, this is max dose for age.  Continue current regimen and adjust as needed.  Would avoid HRT due to maternal history of ovarian cancer.  Refills sent in on Celexa.  Return in 6 months. °

## 2020-01-24 NOTE — Assessment & Plan Note (Signed)
Noted on CT imaging 05/03/18 -- educated patient on this.  Recommend starting Baby ASA 81 MG daily and will check lipid panel today, consider addition of statin for prevention.  Non-smoker.

## 2020-01-24 NOTE — Assessment & Plan Note (Addendum)
Chronic and stable with Requip and Gabapentin.  Continue current regimen and adjust as needed.  Refills sent.  Monitor kidney function closely with medications.  CMP today.

## 2020-01-24 NOTE — Progress Notes (Signed)
BP 126/75   Pulse 64   Temp 98.1 F (36.7 C) (Oral)   Ht 5' 5.1" (1.654 m)   Wt 249 lb 9.6 oz (113.2 kg)   SpO2 95%   BMI 41.41 kg/m    Subjective:    Patient ID: Erika Anderson, female    DOB: 04-Sep-1950, 69 y.o.   MRN: [id number]  HPI: Shaylynne Lunt is a 69 y.o. female presenting on 01/24/2020 for comprehensive medical examination. Current medical complaints include:none  She currently lives with: significant other Menopausal Symptoms: no   HYPERLIPIDEMIA Currently not taking medication and is diet focused. Hyperlipidemia status: good compliance Satisfied with current treatment?  yes Side effects:  no Medication compliance: good compliance Supplements: none Aspirin:  no The 10-year ASCVD risk score [name] DC Jr., et al., 2013) is: 8%   Values used to calculate the score:     Age: 30 years     Sex: Female     Is Non-Hispanic African American: No     Diabetic: No     Tobacco smoker: No     Systolic Blood Pressure: 885 mmHg     Is BP treated: No     HDL Cholesterol: 55 mg/dL     Total Cholesterol: 243 mg/dL Chest pain:  no Coronary artery disease:  no Family history CAD:  no Family history early CAD:  no  MENOPAUSAL SYMPTOMS Started on Celexa at visit on 12/14/19.  She also continues on Gabapentin and Requip, which she takes for her RLS.   Gravida/Para: 2/2 Duration: controlled Symptom severity: improved Hot flashes: improved Night sweats: improved Sleep disturbances: improved Vaginal dryness: no Dyspareunia:no Decreased libido: no Emotional lability: no Stress incontinence: no Previous HRT/pharmacotherapy: no Hysterectomy: yes GYN surgery: hysterectomy several years ago Absolute Contraindications to Hormonal Therapy:     Undiagnosed vaginal bleeding: no    Breast cancer: no    Endometrial cancer: no -- her mother had ovarian cancer    Coronary disease: no    Cerebrovascular disease: no    Venous thromboembolic disease: no   Depression  Screen done today and results listed below:  Depression screen [facility] 2/9 01/24/2020 01/19/2019 01/17/2019 01/12/2018 01/11/2017  Decreased Interest 0 0 0 0 0  Down, Depressed, Hopeless 0 0 0 0 0  PHQ - 2 Score 0 0 0 0 0  Altered sleeping - 0 - - 1  Tired, decreased energy - 0 - - 0  Change in appetite - 0 - - 1  Feeling bad or failure about yourself  - 0 - - 0  Trouble concentrating - 0 - - 0  Moving slowly or fidgety/restless - 0 - - 0  Suicidal thoughts - 0 - - 0  PHQ-9 Score - 0 - - 2  Difficult doing work/chores - Not difficult at all - - -    The patient does not have a history of falls. I did not complete a risk assessment for falls. A plan of care for falls was not documented.   Past Medical History:  Past Medical History:  Diagnosis Date  . Hyperlipidemia   . Obesity (BMI 30-39.9)   . Restless legs syndrome (RLS) 12/28/2014  . RLS (restless legs syndrome)     Surgical History:  Past Surgical History:  Procedure Laterality Date  . ABDOMINAL HYSTERECTOMY     ovaries remain  . HEMORROIDECTOMY      Medications:  Current Outpatient Medications on File Prior to Visit  Medication Sig  . gabapentin (  NEURONTIN) 300 MG capsule Take 1 capsule by mouth three times daily as needed   No current facility-administered medications on file prior to visit.    Allergies:  No Known Allergies  Social History:  Social History   Socioeconomic History  . Marital status: Married    Spouse name: Not on file  . Number of children: Not on file  . Years of education: 87  . Highest education level: High school graduate  Occupational History  . Occupation: retired  Tobacco Use  . Smoking status: Never Smoker  . Smokeless tobacco: Never Used  Vaping Use  . Vaping Use: Never used  Substance and Sexual Activity  . Alcohol use: No    Alcohol/week: 0.0 standard drinks  . Drug use: No  . Sexual activity: Never  Other Topics Concern  . Not on file  Social History Narrative   Helps take  care of grandchildren    Social Determinants of Health   Financial Resource Strain:   . Difficulty of Paying Living Expenses: Not on file  Food Insecurity:   . Worried About Charity fundraiser in the Last Year: Not on file  . Ran Out of Food in the Last Year: Not on file  Transportation Needs:   . Lack of Transportation (Medical): Not on file  . Lack of Transportation (Non-Medical): Not on file  Physical Activity:   . Days of Exercise per Week: Not on file  . Minutes of Exercise per Session: Not on file  Stress:   . Feeling of Stress : Not on file  Social Connections:   . Frequency of Communication with Friends and Family: Not on file  . Frequency of Social Gatherings with Friends and Family: Not on file  . Attends Religious Services: Not on file  . Active Member of Clubs or Organizations: Not on file  . Attends Archivist Meetings: Not on file  . Marital Status: Not on file  Intimate Partner Violence:   . Fear of Current or Ex-Partner: Not on file  . Emotionally Abused: Not on file  . Physically Abused: Not on file  . Sexually Abused: Not on file   Social History   Tobacco Use  Smoking Status Never Smoker  Smokeless Tobacco Never Used   Social History   Substance and Sexual Activity  Alcohol Use No  . Alcohol/week: 0.0 standard drinks    Family History:  Family History  Problem Relation Age of Onset  . Cancer Mother        ovarian  . Arthritis Sister   . Heart disease Brother        MI  . Stroke Paternal Grandmother   . Cancer Paternal Grandfather        lung  . Breast cancer Neg Hx     Past medical history, surgical history, medications, allergies, family history and social history reviewed with patient today and changes made to appropriate areas of the chart.   Review of Systems - negative All other ROS negative except what is listed above and in the HPI.      Objective:    BP 126/75   Pulse 64   Temp 98.1 F (36.7 C) (Oral)   Ht 5'  5.1" (1.654 m)   Wt 249 lb 9.6 oz (113.2 kg)   SpO2 95%   BMI 41.41 kg/m   Wt Readings from Last 3 Encounters:  01/24/20 249 lb 9.6 oz (113.2 kg)  09/10/19 250 lb 12.8 oz (113.8 kg)  04/09/19 256 lb (116.1 kg)    Physical Exam Constitutional:      General: She is awake. She is not in acute distress.    Appearance: She is well-developed. She is not ill-appearing.  HENT:     Head: Normocephalic and atraumatic.     Right Ear: Hearing, tympanic membrane, ear canal and external ear normal. No drainage.     Left Ear: Hearing, tympanic membrane, ear canal and external ear normal. No drainage.     Nose: Nose normal.     Right Sinus: No maxillary sinus tenderness or frontal sinus tenderness.     Left Sinus: No maxillary sinus tenderness or frontal sinus tenderness.     Mouth/Throat:     Mouth: Mucous membranes are moist.     Pharynx: Oropharynx is clear. Uvula midline. No pharyngeal swelling, oropharyngeal exudate or posterior oropharyngeal erythema.  Eyes:     General: Lids are normal.        Right eye: No discharge.        Left eye: No discharge.     Extraocular Movements: Extraocular movements intact.     Conjunctiva/sclera: Conjunctivae normal.     Pupils: Pupils are equal, round, and reactive to light.     Visual Fields: Right eye visual fields normal and left eye visual fields normal.  Neck:     Thyroid: No thyromegaly.     Vascular: No carotid bruit.     Trachea: Trachea normal.  Cardiovascular:     Rate and Rhythm: Normal rate and regular rhythm.     Heart sounds: Normal heart sounds. No murmur heard.  No gallop.   Pulmonary:     Effort: Pulmonary effort is normal. No accessory muscle usage or respiratory distress.     Breath sounds: Normal breath sounds.  Abdominal:     General: Bowel sounds are normal.     Palpations: Abdomen is soft. There is no hepatomegaly or splenomegaly.     Tenderness: There is no abdominal tenderness.  Musculoskeletal:        General: Normal  range of motion.     Cervical back: Normal range of motion and neck supple.     Right lower leg: No edema.     Left lower leg: No edema.  Lymphadenopathy:     Head:     Right side of head: No submental, submandibular, tonsillar, preauricular or posterior auricular adenopathy.     Left side of head: No submental, submandibular, tonsillar, preauricular or posterior auricular adenopathy.     Cervical: No cervical adenopathy.  Skin:    General: Skin is warm and dry.     Capillary Refill: Capillary refill takes less than 2 seconds.     Findings: No rash.  Neurological:     Mental Status: She is alert and oriented to person, place, and time.     Cranial Nerves: Cranial nerves are intact.     Gait: Gait is intact.     Deep Tendon Reflexes: Reflexes are normal and symmetric.     Reflex Scores:      Brachioradialis reflexes are 2+ on the right side and 2+ on the left side.      Patellar reflexes are 2+ on the right side and 2+ on the left side. Psychiatric:        Attention and Perception: Attention normal.        Mood and Affect: Mood normal.        Speech: Speech normal.  Behavior: Behavior normal. Behavior is cooperative.        Thought Content: Thought content normal.        Judgment: Judgment normal.     Results for orders placed or performed in visit on 01/19/19  CBC with Differential/Platelet  Result Value Ref Range   WBC 7.0 3.4 - 10.8 x10E3/uL   RBC 4.61 3.77 - 5.28 x10E6/uL   Hemoglobin 13.9 11.1 - 15.9 g/dL   Hematocrit 42.4 34.0 - 46.6 %   MCV 92 79 - 97 fL   MCH 30.2 26.6 - 33.0 pg   MCHC 32.8 31 - 35 g/dL   RDW 13.5 11.7 - 15.4 %   Platelets 246 150 - 450 x10E3/uL   Neutrophils 49 Not Estab. %   Lymphs 39 Not Estab. %   Monocytes 8 Not Estab. %   Eos 2 Not Estab. %   Basos 1 Not Estab. %   Neutrophils Absolute 3.5 1 - 7 x10E3/uL   Lymphocytes Absolute 2.8 0 - 3 x10E3/uL   Monocytes Absolute 0.6 0 - 0 x10E3/uL   EOS (ABSOLUTE) 0.1 0.0 - 0.4 x10E3/uL    Basophils Absolute 0.1 0 - 0 x10E3/uL   Immature Granulocytes 1 Not Estab. %   Immature Grans (Abs) 0.0 0.0 - 0.1 x10E3/uL  Comprehensive metabolic panel  Result Value Ref Range   Glucose 93 65 - 99 mg/dL   BUN 20 8 - 27 mg/dL   Creatinine, Ser 0.80 0.57 - 1.00 mg/dL   GFR calc non Af Amer 77 >59 mL/min/1.73   GFR calc Af Amer 88 >59 mL/min/1.73   BUN/Creatinine Ratio 25 12 - 28   Sodium 143 134 - 144 mmol/L   Potassium 4.3 3.5 - 5.2 mmol/L   Chloride 104 96 - 106 mmol/L   CO2 25 20 - 29 mmol/L   Calcium 9.7 8.7 - 10.3 mg/dL   Total Protein 6.7 6.0 - 8.5 g/dL   Albumin 4.5 3.8 - 4.8 g/dL   Globulin, Total 2.2 1.5 - 4.5 g/dL   Albumin/Globulin Ratio 2.0 1.2 - 2.2   Bilirubin Total 0.3 0.0 - 1.2 mg/dL   Alkaline Phosphatase 93 39 - 117 IU/L   AST 26 0 - 40 IU/L   ALT 37 (H) 0 - 32 IU/L  Lipid Panel w/o Chol/HDL Ratio  Result Value Ref Range   Cholesterol, Total 243 (H) 100 - 199 mg/dL   Triglycerides 196 (H) 0 - 149 mg/dL   HDL 55 >39 mg/dL   VLDL Cholesterol Cal 39 5 - 40 mg/dL   LDL Calculated 149 (H) 0 - 99 mg/dL  TSH  Result Value Ref Range   TSH 1.380 0.450 - 4.500 uIU/mL      Assessment & Plan:   Problem List Items Addressed This Visit      Cardiovascular and Mediastinum   Aortic atherosclerosis ([place])    Noted on CT imaging 05/03/18 -- educated patient on this.  Recommend starting Baby ASA 81 MG daily and will check lipid panel today, consider addition of statin for prevention.  Non-smoker.        Other   Restless legs syndrome (RLS)    Chronic and stable with Requip and Gabapentin.  Continue current regimen and adjust as needed.  Refills sent.  Monitor kidney function closely with medications.  CMP today.      Relevant Medications   rOPINIRole (REQUIP) 1 MG tablet   Elevated LDL cholesterol level    Noted on past labs, check  lipid panel today.  Current ASCVD 8%, educated her on statin use.  If ongoing elevation will consider starting statin.  Continue diet  focus at this time.      Relevant Orders   Comprehensive metabolic panel   Lipid Panel w/o Chol/HDL Ratio   Insomnia    Improved, secondary to OA and RLS.  Will continue Trazodone as needed, refills sent in for 25-50 MG PRN QHS.  May benefit both sleep and mood with current pain issues.  Declined sleep study at this time.  Return in 6 months.      Menopausal vasomotor syndrome    Improved with Celexa 20 MG, this is max dose for age.  Continue current regimen and adjust as needed.  Would avoid HRT due to maternal history of ovarian cancer.  Refills sent in on Celexa.  Return in 6 months.      BMI 40.0-44.9, adult (HCC)    BMI 41.41 today.  Recommended eating smaller high protein, low fat meals more frequently and exercising 30 mins a day 5 times a week with a goal of 10-15lb weight loss in the next 3 months. Patient voiced their understanding and motivation to adhere to these recommendations.        Other Visit Diagnoses    Routine general medical examination at a health care facility    -  Primary   Annual labs today to include CBC, TSH, lipid, CMP.    Relevant Orders   CBC with Differential/Platelet   TSH   Vitamin D deficiency       Reports history of low levels, will check today and initiate supplement as needed.  DEXA in 2018 normal.   Relevant Orders   VITAMIN D 25 Hydroxy (Vit-D Deficiency, Fractures)       Follow up plan: Return in about 6 months (around 07/26/2020) for HLD and RLS.   LABORATORY TESTING:  - Pap smear: not applicable  IMMUNIZATIONS:   - Tdap: Tetanus vaccination status reviewed: last tetanus booster within 10 years. - Influenza: Up to date - Pneumovax: Up to date - Prevnar: Up to date - HPV: Not applicable - Zostavax vaccine: Up to date  SCREENING: -Mammogram: Up to date  - Colonoscopy: Up to date  - Bone Density: Up to date  -Hearing Test: Not applicable  -Spirometry: Not applicable   PATIENT COUNSELING:   Advised to take 1 mg of folate  supplement per day if capable of pregnancy.   Sexuality: Discussed sexually transmitted diseases, partner selection, use of condoms, avoidance of unintended pregnancy  and contraceptive alternatives.   Advised to avoid cigarette smoking.  I discussed with the patient that most people either abstain from alcohol or drink within safe limits (<=14/week and <=4 drinks/occasion for males, <=7/weeks and <= 3 drinks/occasion for females) and that the risk for alcohol disorders and other health effects rises proportionally with the number of drinks per week and how often a drinker exceeds daily limits.  Discussed cessation/primary prevention of drug use and availability of treatment for abuse.   Diet: Encouraged to adjust caloric intake to maintain  or achieve ideal body weight, to reduce intake of dietary saturated fat and total fat, to limit sodium intake by avoiding high sodium foods and not adding table salt, and to maintain adequate dietary potassium and calcium preferably from fresh fruits, vegetables, and low-fat dairy products.    stressed the importance of regular exercise  Injury prevention: Discussed safety belts, safety helmets, smoke detector, smoking near bedding or  upholstery.   Dental health: Discussed importance of regular tooth brushing, flossing, and dental visits.    NEXT PREVENTATIVE PHYSICAL DUE IN 1 YEAR. Return in about 6 months (around 07/26/2020) for HLD and RLS.

## 2020-01-24 NOTE — Assessment & Plan Note (Signed)
BMI 41.41 today.  Recommended eating smaller high protein, low fat meals more frequently and exercising 30 mins a day 5 times a week with a goal of 10-15lb weight loss in the next 3 months. Patient voiced their understanding and motivation to adhere to these recommendations.

## 2020-01-24 NOTE — Patient Instructions (Signed)
Preventing High Cholesterol Cholesterol is a white, waxy substance similar to fat that the human body needs to help build cells. The liver makes all the cholesterol that a person's body needs. Having high cholesterol (hypercholesterolemia) increases a person's risk for heart disease and stroke. Extra (excess) cholesterol comes from the food the person eats. High cholesterol can often be prevented with diet and lifestyle changes. If you already have high cholesterol, you can control it with diet and lifestyle changes and with medicine. How can high cholesterol affect me? If you have high cholesterol, deposits (plaques) may build up on the walls of your arteries. The arteries are the blood vessels that carry blood away from your heart. Plaques make the arteries narrower and stiffer. This can limit or block blood flow and cause blood clots to form. Blood clots:  Are tiny balls of cells that form in your blood.  Can move to the heart or brain, causing a heart attack or stroke. Plaques in arteries greatly increase your risk for heart attack and stroke.Making diet and lifestyle changes can reduce your risk for these conditions that may threaten your life. What can increase my risk? This condition is more likely to develop in people who:  Eat foods that are high in saturated fat or cholesterol. Saturated fat is mostly found in: ? Foods that contain animal fat, such as red meat and some dairy products. ? Certain fatty foods made from plants, such as tropical oils.  Are overweight.  Are not getting enough exercise.  Have a family history of high cholesterol. What actions can I take to prevent this? Nutrition   Eat less saturated fat.  Avoid trans fats (partially hydrogenated oils). These are often found in margarine and in some baked goods, fried foods, and snacks bought in packages.  Avoid precooked or cured meat, such as sausages or meat loaves.  Avoid foods and drinks that have added  sugars.  Eat more fruits, vegetables, and whole grains.  Choose healthy sources of protein, such as fish, poultry, lean cuts of red meat, beans, peas, lentils, and nuts.  Choose healthy sources of fat, such as: ? Nuts. ? Vegetable oils, especially olive oil. ? Fish that have healthy fats (omega-3 fatty acids), such as mackerel or salmon. The items listed above may not be a complete list of recommended foods and beverages. Contact a dietitian for more information. Lifestyle  Lose weight if you are overweight. Losing 5-10 lb (2.3-4.5 kg) can help prevent or control high cholesterol. It can also lower your risk for diabetes and high blood pressure. Ask your health care provider to help you with a diet and exercise plan to lose weight safely.  Do not use any products that contain nicotine or tobacco, such as cigarettes, e-cigarettes, and chewing tobacco. If you need help quitting, ask your health care provider.  Limit your alcohol intake. ? Do not drink alcohol if:  Your health care provider tells you not to drink.  You are pregnant, may be pregnant, or are planning to become pregnant. ? If you drink alcohol:  Limit how much you use to:  0-1 drink a day for women.  0-2 drinks a day for men.  Be aware of how much alcohol is in your drink. In the [place] one drink equals one 12 oz bottle of beer (355 mL), one 5 oz glass of wine (148 mL), or one 1 oz glass of hard liquor (44 mL). Activity   Get enough exercise. Each week, do at   least 150 minutes of exercise that takes a medium level of effort (moderate-intensity exercise). ? This is exercise that:  Makes your heart beat faster and makes you breathe harder than usual.  Allows you to still be able to talk. ? You could exercise in short sessions several times a day or longer sessions a few times a week. For example, on 5 days each week, you could walk fast or ride your bike 3 times a day for 10 minutes each time.  Do exercises as told  by your health care provider. Medicines  In addition to diet and lifestyle changes, your health care provider may recommend medicines to help lower cholesterol. This may be a medicine to lower the amount of cholesterol your liver makes. You may need medicine if: ? Diet and lifestyle changes do not lower your cholesterol enough. ? You have high cholesterol and other risk factors for heart disease or stroke.  Take over-the-counter and prescription medicines only as told by your health care provider. General information  Manage your risk factors for high cholesterol. Talk with your health care provider about all your risk factors and how to lower your risk.  Manage other conditions that you have, such as diabetes or high blood pressure (hypertension).  Have blood tests to check your cholesterol levels at regular points in time as told by your health care provider.  Keep all follow-up visits as told by your health care provider. This is important. Where to find more information  American Heart Association: www.heart.org  National Heart, Lung, and Blood Institute: www.nhlbi.nih.gov Summary  High cholesterol increases your risk for heart disease and stroke. By keeping your cholesterol level low, you can reduce your risk for these conditions.  High cholesterol can often be prevented with diet and lifestyle changes.  Work with your health care provider to manage your risk factors, and have your blood tested regularly. This information is not intended to replace advice given to you by your health care provider. Make sure you discuss any questions you have with your health care provider. Document Revised: 09/15/2018 Document Reviewed: 01/31/2016 Elsevier Patient Education  2020 [employer]  

## 2020-01-24 NOTE — Assessment & Plan Note (Signed)
Noted on past labs, check lipid panel today.  Current ASCVD 8%, educated her on statin use.  If ongoing elevation will consider starting statin.  Continue diet focus at this time.

## 2020-01-25 LAB — CBC WITH DIFFERENTIAL/PLATELET
Basophils Absolute: 0 10*3/uL (ref 0.0–0.2)
Basos: 1 %
EOS (ABSOLUTE): 0.1 10*3/uL (ref 0.0–0.4)
Eos: 1 %
Hematocrit: 40.8 % (ref 34.0–46.6)
Hemoglobin: 13.5 g/dL (ref 11.1–15.9)
Immature Grans (Abs): 0.1 10*3/uL (ref 0.0–0.1)
Immature Granulocytes: 1 %
Lymphocytes Absolute: 3.1 10*3/uL (ref 0.7–3.1)
Lymphs: 40 %
MCH: 30.5 pg (ref 26.6–33.0)
MCHC: 33.1 g/dL (ref 31.5–35.7)
MCV: 92 fL (ref 79–97)
Monocytes Absolute: 0.7 10*3/uL (ref 0.1–0.9)
Monocytes: 9 %
Neutrophils Absolute: 3.8 10*3/uL (ref 1.4–7.0)
Neutrophils: 48 %
Platelets: 211 10*3/uL (ref 150–450)
RBC: 4.42 x10E6/uL (ref 3.77–5.28)
RDW: 12.1 % (ref 11.7–15.4)
WBC: 7.8 10*3/uL (ref 3.4–10.8)

## 2020-01-25 LAB — COMPREHENSIVE METABOLIC PANEL
ALT: 16 IU/L (ref 0–32)
AST: 16 IU/L (ref 0–40)
Albumin/Globulin Ratio: 2.1 (ref 1.2–2.2)
Albumin: 4.4 g/dL (ref 3.8–4.8)
Alkaline Phosphatase: 88 IU/L (ref 48–121)
BUN/Creatinine Ratio: 27 (ref 12–28)
BUN: 22 mg/dL (ref 8–27)
Bilirubin Total: 0.4 mg/dL (ref 0.0–1.2)
CO2: 27 mmol/L (ref 20–29)
Calcium: 9.4 mg/dL (ref 8.7–10.3)
Chloride: 103 mmol/L (ref 96–106)
Creatinine, Ser: 0.81 mg/dL (ref 0.57–1.00)
GFR calc Af Amer: 86 mL/min/{1.73_m2} (ref >59)
GFR calc non Af Amer: 75 mL/min/{1.73_m2} (ref >59)
Globulin, Total: 2.1 g/dL (ref 1.5–4.5)
Glucose: 94 mg/dL (ref 65–99)
Potassium: 4.8 mmol/L (ref 3.5–5.2)
Sodium: 140 mmol/L (ref 134–144)
Total Protein: 6.5 g/dL (ref 6.0–8.5)

## 2020-01-25 LAB — LIPID PANEL W/O CHOL/HDL RATIO
Cholesterol, Total: 217 mg/dL — ABNORMAL HIGH (ref 100–199)
HDL: 52 mg/dL (ref >39)
LDL Chol Calc (NIH): 144 mg/dL — ABNORMAL HIGH (ref 0–99)
Triglycerides: 115 mg/dL (ref 0–149)
VLDL Cholesterol Cal: 21 mg/dL (ref 5–40)

## 2020-01-25 LAB — TSH: TSH: 1.59 u[IU]/mL (ref 0.450–4.500)

## 2020-01-25 LAB — VITAMIN D 25 HYDROXY (VIT D DEFICIENCY, FRACTURES): Vit D, 25-Hydroxy: 27.5 ng/mL — ABNORMAL LOW (ref 30.0–100.0)

## 2020-01-25 NOTE — Progress Notes (Signed)
Good afternoon, please let Ivory know her labs have returned.  Everything looks great with exception of cholesterol levels, although some trend down from previous visit.  Can continue diet and exercise focus at this time.  Also Vitamin D a little low, recommend she start taking Vitamin D3 1000 to 2000 units daily and will recheck next visit.  Have a great day!!

## 2020-02-10 DIAGNOSIS — Z20822 Contact with and (suspected) exposure to covid-19: Secondary | ICD-10-CM | POA: Diagnosis not present

## 2020-02-13 NOTE — Telephone Encounter (Signed)
Late entry: Spoke with patient and informed her that our office is still in network with her insurance. Patient was relieved and excited she will be able to continue seeing Jolene.

## 2020-02-22 ENCOUNTER — Ambulatory Visit (INDEPENDENT_AMBULATORY_CARE_PROVIDER_SITE_OTHER): Payer: Medicare HMO

## 2020-02-22 VITALS — Ht 66.0 in | Wt 245.0 lb

## 2020-02-22 DIAGNOSIS — Z0000 Encounter for general adult medical examination without abnormal findings: Secondary | ICD-10-CM

## 2020-02-22 NOTE — Progress Notes (Signed)
I connected with Erika Anderson today by telephone and verified that I am speaking with the correct person using two identifiers. Location patient: home Location provider: work Persons participating in the virtual visit: Erika Anderson, Erika Durand LPN.   I discussed the limitations, risks, security and privacy concerns of performing an evaluation and management service by telephone and the availability of in person appointments. I also discussed with the patient that there may be a patient responsible charge related to this service. The patient expressed understanding and verbally consented to this telephonic visit.    Interactive audio and video telecommunications were attempted between this provider and patient, however failed, due to patient having technical difficulties OR patient did not have access to video capability.  We continued and completed visit with audio only.     Vital signs may be patient reported or missing.  Subjective:   Erika Anderson is a 69 y.o. female who presents for Medicare Annual (Subsequent) preventive examination.  Review of Systems     Cardiac Risk Factors include: advanced age (>27men, >35 women);obesity (BMI >30kg/m2);sedentary lifestyle     Objective:    Today's Vitals   02/22/20 1256  Weight: 245 lb (111.1 kg)  Height: 5\' 6"  (1.676 m)   Body mass index is 39.54 kg/m.  Advanced Directives 02/22/2020 01/17/2019 05/03/2018 01/12/2018 01/11/2017  Does Patient Have a Medical Advance Directive? No No No No No  Would patient like information on creating a medical advance directive? - - No - Patient declined Yes (MAU/Ambulatory/Procedural Areas - Information given) Yes (ED - Information included in AVS)    Current Medications (verified) Outpatient Encounter Medications as of 02/22/2020  Medication Sig  . citalopram (CELEXA) 20 MG tablet Take 1 tablet (20 mg total) by mouth daily.  . meloxicam (MOBIC) 7.5 MG tablet Take 1 tablet (7.5 mg total) by  mouth daily.  Marland Kitchen rOPINIRole (REQUIP) 1 MG tablet Take 1 tablet (1 mg total) by mouth 4 (four) times daily.  . traZODone (DESYREL) 50 MG tablet TAKE 1/2 TO 1 (ONE-HALF TO ONE) TABLET BY MOUTH AT BEDTIME AS NEEDED FOR SLEEP  . gabapentin (NEURONTIN) 300 MG capsule Take 1 capsule by mouth three times daily as needed (Patient not taking: Reported on 02/22/2020)   No facility-administered encounter medications on file as of 02/22/2020.    Allergies (verified) Patient has no known allergies.   History: Past Medical History:  Diagnosis Date  . Hyperlipidemia   . Obesity (BMI 30-39.9)   . Restless legs syndrome (RLS) 12/28/2014  . RLS (restless legs syndrome)    Past Surgical History:  Procedure Laterality Date  . ABDOMINAL HYSTERECTOMY     ovaries remain  . HEMORROIDECTOMY     Family History  Problem Relation Age of Onset  . Cancer Mother        ovarian  . Arthritis Sister   . Heart disease Brother        MI  . Stroke Paternal Grandmother   . Cancer Paternal Grandfather        lung  . Breast cancer Neg Hx    Social History   Socioeconomic History  . Marital status: Married    Spouse name: Not on file  . Number of children: Not on file  . Years of education: 76  . Highest education level: High school graduate  Occupational History  . Occupation: retired  Tobacco Use  . Smoking status: Never Smoker  . Smokeless tobacco: Never Used  Vaping Use  . Vaping  Use: Never used  Substance and Sexual Activity  . Alcohol use: No    Alcohol/week: 0.0 standard drinks  . Drug use: No  . Sexual activity: Not Currently  Other Topics Concern  . Not on file  Social History Narrative   Helps take care of grandchildren    Social Determinants of Health   Financial Resource Strain: Low Risk   . Difficulty of Paying Living Expenses: Not hard at all  Food Insecurity: No Food Insecurity  . Worried About Charity fundraiser in the Last Year: Never true  . Ran Out of Food in the Last Year:  Never true  Transportation Needs: No Transportation Needs  . Lack of Transportation (Medical): No  . Lack of Transportation (Non-Medical): No  Physical Activity: Inactive  . Days of Exercise per Week: 0 days  . Minutes of Exercise per Session: 0 min  Stress: No Stress Concern Present  . Feeling of Stress : Not at all  Social Connections:   . Frequency of Communication with Friends and Family: Not on file  . Frequency of Social Gatherings with Friends and Family: Not on file  . Attends Religious Services: Not on file  . Active Member of Clubs or Organizations: Not on file  . Attends Archivist Meetings: Not on file  . Marital Status: Not on file    Tobacco Counseling Counseling given: Not Answered   Clinical Intake:  Pre-visit preparation completed: Yes  Pain : No/denies pain     Nutritional Status: BMI > 30  Obese Nutritional Risks: None Diabetes: No  How often do you need to have someone help you when you read instructions, pamphlets, or other written materials from your doctor or pharmacy?: 1 - Never What is the last grade level you completed in school?: 12th grade  Diabetic? no  Interpreter Needed?: No  Information entered by :: NAllen LPN   Activities of Daily Living In your present state of health, do you have any difficulty performing the following activities: 02/22/2020 01/24/2020  Hearing? N N  Vision? N N  Difficulty concentrating or making decisions? N N  Walking or climbing stairs? Y N  Dressing or bathing? N N  Doing errands, shopping? N N  Preparing Food and eating ? N -  Using the Toilet? N -  In the past six months, have you accidently leaked urine? N -  Do you have problems with loss of bowel control? N -  Managing your Medications? N -  Managing your Finances? N -  Housekeeping or managing your Housekeeping? N -  Some recent data might be hidden    Patient Care Team: Venita Lick, NP as PCP - General (Nurse  Practitioner) Rico Junker, RN as Registered Nurse Theodore Demark, RN as Registered Nurse  Indicate any recent Medical Services you may have received from other than [facility] providers in the past year (date may be approximate).     Assessment:   This is a routine wellness examination for Erika Anderson.  Hearing/Vision screen  Hearing Screening   125Hz  250Hz  500Hz  1000Hz  2000Hz  3000Hz  4000Hz  6000Hz  8000Hz   Right ear:           Left ear:           Vision Screening Comments: Regular eye exams, [facility]  Dietary issues and exercise activities discussed: Current Exercise Habits: The patient does not participate in regular exercise at present  Goals    . DIET - INCREASE WATER INTAKE  Recommend drinking at least 6-8 glasses of water a day     . Patient Stated     02/22/2020, stay active      Depression Screen PHQ 2/9 Scores 02/22/2020 01/24/2020 01/19/2019 01/17/2019 01/12/2018 01/11/2017 12/30/2015  PHQ - 2 Score 0 0 0 0 0 0 0  PHQ- 9 Score - - 0 - - 2 -    Fall Risk Fall Risk  02/22/2020 01/24/2020 09/10/2019 05/10/2018 01/12/2018  Falls in the past year? 0 0 0 0 No  Number falls in past yr: - 0 0 - -  Injury with Fall? - 0 0 - -  Risk for fall due to : Medication side effect No Fall Risks - - -  Follow up Falls evaluation completed;Education provided;Falls prevention discussed Falls evaluation completed Falls evaluation completed Falls evaluation completed -    Any stairs in or around the home? Yes  If so, are there any without handrails? No  Home free of loose throw rugs in walkways, pet beds, electrical cords, etc? Yes  Adequate lighting in your home to reduce risk of falls? Yes   ASSISTIVE DEVICES UTILIZED TO PREVENT FALLS:  Life alert? No  Use of a cane, walker or w/c? No  Grab bars in the bathroom? No  Shower chair or bench in shower? No  Elevated toilet seat or a handicapped toilet? No   TIMED UP AND GO:  Was the test performed? No .   Cognitive Function:      6CIT Screen 02/22/2020 01/12/2018  What Year? 0 points 0 points  What month? 0 points 0 points  What time? 0 points 0 points  Count back from 20 0 points 0 points  Months in reverse 0 points 0 points  Repeat phrase 2 points 2 points  Total Score 2 2    Immunizations Immunization History  Administered Date(s) Administered  . Fluad Quad(high Dose 65+) 04/09/2019  . Influenza,inj,Quad PF,6+ Mos 03/09/2016  . Moderna SARS-COVID-2 Vaccination 07/04/2019, 08/01/2019  . Pneumococcal Conjugate-13 01/11/2017  . Pneumococcal Polysaccharide-23 01/12/2018  . Tdap 12/30/2015  . Zoster Recombinat (Shingrix) 02/07/2019    TDAP status: Up to date Flu Vaccine status: Up to date Pneumococcal vaccine status: Up to date Covid-19 vaccine status: Completed vaccines  Qualifies for Shingles Vaccine? Yes   Zostavax completed No   Shingrix Completed?: Yes  Screening Tests Health Maintenance  Topic Date Due  . INFLUENZA VACCINE  01/06/2020  . MAMMOGRAM  09/17/2020  . COLONOSCOPY  06/07/2021  . TETANUS/TDAP  12/29/2025  . DEXA SCAN  Completed  . COVID-19 Vaccine  Completed  . Hepatitis C Screening  Completed  . PNA vac Low Risk Adult  Completed    Health Maintenance  Health Maintenance Due  Topic Date Due  . INFLUENZA VACCINE  01/06/2020    Colorectal cancer screening: Completed 06/08/2011. Repeat every 10 years Mammogram status: Completed 09/18/2019. Repeat every year Bone Density status: Completed 03/29/2017.   Lung Cancer Screening: (Low Dose CT Chest recommended if Age 32-80 years, 30 pack-year currently smoking OR have quit w/in 15years.) does not qualify.   Lung Cancer Screening Referral: no  Additional Screening:  Hepatitis C Screening: does qualify; Completed 01/11/2017   Vision Screening: Recommended annual ophthalmology exams for early detection of glaucoma and other disorders of the eye. Is the patient up to date with their annual eye exam?  Yes  Who is the provider or what  is the name of the office in which the patient attends annual eye  exams? [facility] If pt is not established with a provider, would they like to be referred to a provider to establish care? No .   Dental Screening: Recommended annual dental exams for proper oral hygiene  Community Resource Referral / Chronic Care Management: CRR required this visit?  No   CCM required this visit?  No      Plan:     I have personally reviewed and noted the following in the patient's chart:   . Medical and social history . Use of alcohol, tobacco or illicit drugs  . Current medications and supplements . Functional ability and status . Nutritional status . Physical activity . Advanced directives . List of other physicians . Hospitalizations, surgeries, and ER visits in previous 12 months . Vitals . Screenings to include cognitive, depression, and falls . Referrals and appointments  In addition, I have reviewed and discussed with patient certain preventive protocols, quality metrics, and best practice recommendations. A written personalized care plan for preventive services as well as general preventive health recommendations were provided to patient.     Kellie Simmering, LPN   11/19/18[birth year]   Nurse Notes:

## 2020-02-22 NOTE — Patient Instructions (Signed)
Erika Anderson , Thank you for taking time to come for your Medicare Wellness Visit. I appreciate your ongoing commitment to your health goals. Please review the following plan we discussed and let me know if I can assist you in the future.   Screening recommendations/referrals: Colonoscopy: completed 06/08/2011 Mammogram: completed 09/18/2019 Bone Density: completed 03/29/2017 Recommended yearly ophthalmology/optometry visit for glaucoma screening and checkup Recommended yearly dental visit for hygiene and checkup  Vaccinations: Influenza vaccine: due Pneumococcal vaccine: completed 01/12/2018 Tdap vaccine: completed 12/30/2015 Shingles vaccine: completed   Covid-19: 08/01/2019, 07/04/2019  Advanced directives: Advance directive discussed with you today.    Conditions/risks identified: none  Next appointment: Follow up in one year for your annual wellness visit    Preventive Care 65 Years and Older, Female Preventive care refers to lifestyle choices and visits with your health care provider that can promote health and wellness. What does preventive care include?  A yearly physical exam. This is also called an annual well check.  Dental exams once or twice a year.  Routine eye exams. Ask your health care provider how often you should have your eyes checked.  Personal lifestyle choices, including:  Daily care of your teeth and gums.  Regular physical activity.  Eating a healthy diet.  Avoiding tobacco and drug use.  Limiting alcohol use.  Practicing safe sex.  Taking low-dose aspirin every day.  Taking vitamin and mineral supplements as recommended by your health care provider. What happens during an annual well check? The services and screenings done by your health care provider during your annual well check will depend on your age, overall health, lifestyle risk factors, and family history of disease. Counseling  Your health care provider may ask you questions about  your:  Alcohol use.  Tobacco use.  Drug use.  Emotional well-being.  Home and relationship well-being.  Sexual activity.  Eating habits.  History of falls.  Memory and ability to understand (cognition).  Work and work Statistician.  Reproductive health. Screening  You may have the following tests or measurements:  Height, weight, and BMI.  Blood pressure.  Lipid and cholesterol levels. These may be checked every 5 years, or more frequently if you are over 66 years old.  Skin check.  Lung cancer screening. You may have this screening every year starting at age 50 if you have a 30-pack-year history of smoking and currently smoke or have quit within the past 15 years.  Fecal occult blood test (FOBT) of the stool. You may have this test every year starting at age 13.  Flexible sigmoidoscopy or colonoscopy. You may have a sigmoidoscopy every 5 years or a colonoscopy every 10 years starting at age 42.  Hepatitis C blood test.  Hepatitis B blood test.  Sexually transmitted disease (STD) testing.  Diabetes screening. This is done by checking your blood sugar (glucose) after you have not eaten for a while (fasting). You may have this done every 1-3 years.  Bone density scan. This is done to screen for osteoporosis. You may have this done starting at age 56.  Mammogram. This may be done every 1-2 years. Talk to your health care provider about how often you should have regular mammograms. Talk with your health care provider about your test results, treatment options, and if necessary, the need for more tests. Vaccines  Your health care provider may recommend certain vaccines, such as:  Influenza vaccine. This is recommended every year.  Tetanus, diphtheria, and acellular pertussis (Tdap, Td) vaccine. You  may need a Td booster every 10 years.  Zoster vaccine. You may need this after age 103.  Pneumococcal 13-valent conjugate (PCV13) vaccine. One dose is recommended  after age 57.  Pneumococcal polysaccharide (PPSV23) vaccine. One dose is recommended after age 45. Talk to your health care provider about which screenings and vaccines you need and how often you need them. This information is not intended to replace advice given to you by your health care provider. Make sure you discuss any questions you have with your health care provider. Document Released: 06/20/2015 Document Revised: 02/11/2016 Document Reviewed: 03/25/2015 Elsevier Interactive Patient Education  2017 [place] Prevention in the Home Falls can cause injuries. They can happen to people of all ages. There are many things you can do to make your home safe and to help prevent falls. What can I do on the outside of my home?  Regularly fix the edges of walkways and driveways and fix any cracks.  Remove anything that might make you trip as you walk through a door, such as a raised step or threshold.  Trim any bushes or trees on the path to your home.  Use bright outdoor lighting.  Clear any walking paths of anything that might make someone trip, such as rocks or tools.  Regularly check to see if handrails are loose or broken. Make sure that both sides of any steps have handrails.  Any raised decks and porches should have guardrails on the edges.  Have any leaves, snow, or ice cleared regularly.  Use sand or salt on walking paths during winter.  Clean up any spills in your garage right away. This includes oil or grease spills. What can I do in the bathroom?  Use night lights.  Install grab bars by the toilet and in the tub and shower. Do not use towel bars as grab bars.  Use non-skid mats or decals in the tub or shower.  If you need to sit down in the shower, use a plastic, non-slip stool.  Keep the floor dry. Clean up any water that spills on the floor as soon as it happens.  Remove soap buildup in the tub or shower regularly.  Attach bath mats securely with  double-sided non-slip rug tape.  Do not have throw rugs and other things on the floor that can make you trip. What can I do in the bedroom?  Use night lights.  Make sure that you have a light by your bed that is easy to reach.  Do not use any sheets or blankets that are too big for your bed. They should not hang down onto the floor.  Have a firm chair that has side arms. You can use this for support while you get dressed.  Do not have throw rugs and other things on the floor that can make you trip. What can I do in the kitchen?  Clean up any spills right away.  Avoid walking on wet floors.  Keep items that you use a lot in easy-to-reach places.  If you need to reach something above you, use a strong step stool that has a grab bar.  Keep electrical cords out of the way.  Do not use floor polish or wax that makes floors slippery. If you must use wax, use non-skid floor wax.  Do not have throw rugs and other things on the floor that can make you trip. What can I do with my stairs?  Do not leave any items  on the stairs.  Make sure that there are handrails on both sides of the stairs and use them. Fix handrails that are broken or loose. Make sure that handrails are as long as the stairways.  Check any carpeting to make sure that it is firmly attached to the stairs. Fix any carpet that is loose or worn.  Avoid having throw rugs at the top or bottom of the stairs. If you do have throw rugs, attach them to the floor with carpet tape.  Make sure that you have a light switch at the top of the stairs and the bottom of the stairs. If you do not have them, ask someone to add them for you. What else can I do to help prevent falls?  Wear shoes that:  Do not have high heels.  Have rubber bottoms.  Are comfortable and fit you well.  Are closed at the toe. Do not wear sandals.  If you use a stepladder:  Make sure that it is fully opened. Do not climb a closed stepladder.  Make  sure that both sides of the stepladder are locked into place.  Ask someone to hold it for you, if possible.  Clearly mark and make sure that you can see:  Any grab bars or handrails.  First and last steps.  Where the edge of each step is.  Use tools that help you move around (mobility aids) if they are needed. These include:  Canes.  Walkers.  Scooters.  Crutches.  Turn on the lights when you go into a dark area. Replace any light bulbs as soon as they burn out.  Set up your furniture so you have a clear path. Avoid moving your furniture around.  If any of your floors are uneven, fix them.  If there are any pets around you, be aware of where they are.  Review your medicines with your doctor. Some medicines can make you feel dizzy. This can increase your chance of falling. Ask your doctor what other things that you can do to help prevent falls. This information is not intended to replace advice given to you by your health care provider. Make sure you discuss any questions you have with your health care provider. Document Released: 03/20/2009 Document Revised: 10/30/2015 Document Reviewed: 06/28/2014 Elsevier Interactive Patient Education  2017 [employer].

## 2020-05-20 NOTE — Progress Notes (Signed)
Virtual Visit via Video Note  I connected with Erika Anderson on 05/20/20 at  9:20 AM EST by a video enabled telemedicine application and verified that I am speaking with the correct person using two identifiers.  Location: Patient: home Provider: [facility]   I discussed the limitations of evaluation and management by telemedicine and the availability of in person appointments. The patient expressed understanding and agreed to proceed.  History of Present Illness:  UPPER RESPIRATORY TRACT INFECTION Since Saturday 12/11. Thinks it may have started after raking leaves outside in the cold.  Worst symptom: cough and congestion Fever: no Cough: yes, productive phlegm Shortness of breath: no Wheezing: no Chest pain: no Chest tightness: no Chest congestion: yes Nasal congestion: yes Runny nose: sometimes Sneezing: no Sore throat: no Sinus pressure: yes Headache: yes Ear pain: no  Ear pressure: no  Eyes red/itching:no Eye drainage/crusting: no  Vomiting: no Rash: no Fatigue: yes Sick contacts: no Context: better Recurrent sinusitis: no Relief with OTC cold/cough medications: yes  Treatments attempted: mucinex  COVID vaccinated. Due for booster.    Observations/Objective:  Patient had trouble connecting to video visit, entirety of visit conducted over the phone.  Speaks in full sentences, no respiratory distress.  Assessment and Plan:  Viral URI Symptoms and exam most consistent with viral URI. Afebrile and without respiratory compromist. Supportive care including OTC symptom relief, maintaining adequate oral hydration, honey for cough. Will test for COVID as may be a candidate for MAB tx if positive, self-quarantine instructions provided. Emergency precautions reviewed, see AVS for details.      I discussed the assessment and treatment plan with the patient. The patient was provided an opportunity to ask questions and all were answered. The patient agreed with the plan  and demonstrated an understanding of the instructions.   The patient was advised to call back or seek an in-person evaluation if the symptoms worsen or if the condition fails to improve as anticipated.  I provided 10 minutes of non-face-to-face time during this encounter.   Myles Gip, DO

## 2020-05-21 ENCOUNTER — Telehealth (INDEPENDENT_AMBULATORY_CARE_PROVIDER_SITE_OTHER): Payer: Medicare HMO | Admitting: Family Medicine

## 2020-05-21 ENCOUNTER — Encounter: Payer: Self-pay | Admitting: Family Medicine

## 2020-05-21 DIAGNOSIS — J069 Acute upper respiratory infection, unspecified: Secondary | ICD-10-CM | POA: Insufficient documentation

## 2020-05-21 NOTE — Addendum Note (Signed)
Addended by: Gerrit Halls L on: 05/21/2020 03:20 PM   Modules accepted: Orders

## 2020-05-21 NOTE — Addendum Note (Signed)
Addended by: Georgina Peer on: 05/21/2020 03:01 PM   Modules accepted: Orders

## 2020-05-21 NOTE — Patient Instructions (Signed)
You most likely have a cold and it should start to get better about 10-14 days after it started.    For your cough, try mucinex or honey. Lemon can also be helpful to soothe sore throat.  For your nasal congestion and runny nose, try using Afrin (generic is Oxymetazoline) twice daily for 3 days.  Do not use for longer that 3 days.    Some other therapies you can try are: push fluids, rest, gargle warm salt water and use vaporizer or mist as needed.   Drinking warm liquids such as teas and soups can help with secretions and cough. A mist humidifier or vaporizer can work well to help with secretions and cough.  It is very important to clean the humidifier between use according to the instructions.    Our nurses will give you a call to schedule your COVID and flu tests. While you are waiting for your results, you need to self-quarantine. Self-quarantine means that you do not leave your house. You do not go to the store or drive around. You try to avoid the other people who live with you (stay in a different room and wear a mask when you have to be in contact with them.) Your family members and house-mates need to also be aware that you are being tested in case they develop any symptoms. If you become significantly worse and cannot breathe while waiting for results, call 911. You may end self-quarantine if your test results negative or 10 days from symptom onset with 24 hours fever free if you test positive.

## 2020-05-21 NOTE — Assessment & Plan Note (Addendum)
Symptoms and exam most consistent with viral URI. Afebrile and without respiratory compromist. Supportive care including OTC symptom relief, maintaining adequate oral hydration, honey for cough. Will test for COVID as may be a candidate for MAB tx if positive, self-quarantine instructions provided. Emergency precautions reviewed, see AVS for details.

## 2020-05-22 ENCOUNTER — Telehealth: Payer: Medicare HMO | Admitting: Unknown Physician Specialty

## 2020-05-22 LAB — VERITOR FLU A/B WAIVED
Influenza A: NEGATIVE
Influenza B: NEGATIVE

## 2020-05-23 ENCOUNTER — Other Ambulatory Visit: Payer: Self-pay | Admitting: Family Medicine

## 2020-05-23 ENCOUNTER — Telehealth: Payer: Self-pay | Admitting: Nurse Practitioner

## 2020-05-23 LAB — SARS-COV-2, NAA 2 DAY TAT

## 2020-05-23 LAB — NOVEL CORONAVIRUS, NAA: SARS-CoV-2, NAA: NOT DETECTED

## 2020-05-23 MED ORDER — DM-GUAIFENESIN ER 30-600 MG PO TB12: 1.0000 | ORAL_TABLET | Freq: Two times a day (BID) | ORAL | 0 refills | Status: AC | PRN

## 2020-05-23 NOTE — Telephone Encounter (Signed)
Done

## 2020-05-23 NOTE — Telephone Encounter (Signed)
Routing to provider that saw the patient.

## 2020-05-23 NOTE — Telephone Encounter (Signed)
Patient is calling to receive the results of her COVID test that her negative. Patient expressed understanding. Patient is calling to request a script for congestion, coughing a lot of phelm, with nose running. Preferred [place]. [phone]

## 2020-05-26 NOTE — Telephone Encounter (Signed)
Patient notified

## 2020-06-25 ENCOUNTER — Telehealth (INDEPENDENT_AMBULATORY_CARE_PROVIDER_SITE_OTHER): Payer: Medicare HMO | Admitting: Nurse Practitioner

## 2020-06-25 ENCOUNTER — Other Ambulatory Visit: Payer: Self-pay | Admitting: Nurse Practitioner

## 2020-06-25 ENCOUNTER — Encounter: Payer: Self-pay | Admitting: Nurse Practitioner

## 2020-06-25 DIAGNOSIS — U071 COVID-19: Secondary | ICD-10-CM

## 2020-06-25 DIAGNOSIS — Z8616 Personal history of COVID-19: Secondary | ICD-10-CM | POA: Insufficient documentation

## 2020-06-25 MED ORDER — ALBUTEROL SULFATE HFA 108 (90 BASE) MCG/ACT IN AERS: 2.0000 | INHALATION_SPRAY | Freq: Four times a day (QID) | RESPIRATORY_TRACT | 0 refills | Status: DC | PRN

## 2020-06-25 MED ORDER — HYDROCOD POLST-CPM POLST ER 10-8 MG/5ML PO SUER: 5.0000 mL | Freq: Two times a day (BID) | ORAL | 0 refills | Status: DC | PRN

## 2020-06-25 MED ORDER — PREDNISONE 20 MG PO TABS: 40.0000 mg | ORAL_TABLET | Freq: Every day | ORAL | 0 refills | Status: AC

## 2020-06-25 NOTE — Telephone Encounter (Signed)
Pharmacy request change in medication- see note

## 2020-06-25 NOTE — Assessment & Plan Note (Signed)
Home test positive, symptoms starting 06/23/20.  Exposure to grand children.  At this time is lower risk and vaccinated x 2 + improving.  Recommend she self quarantine x 10 days.  Scripts for Prednisone burst, Albuterol, and Tussionex sent in.   I would recommend starting Vitamin C 500 MG twice a day, Quercetin 250-500 MG twice a day, Zinc 75-100 MG daily, and Vitamin D3 2000-4000 units daily to help.  You may also use over the counter cold medications for symptom relief.  Ensure good hydration and rest.  Tylenol for fever if presents. If any increased SOB or CP immediately go to ER.  Return to office if any worsening or ongoing symptoms.

## 2020-06-25 NOTE — Telephone Encounter (Signed)
Can this be changed per pharmacy

## 2020-06-25 NOTE — Patient Instructions (Signed)

## 2020-06-25 NOTE — Progress Notes (Signed)
There were no vitals taken for this visit.   Subjective:    Patient ID: Erika Anderson, female    DOB: Jan 06, 1951, 70 y.o.   MRN: [id number]  HPI: Erika Anderson is a 70 y.o. female  Chief Complaint  Patient presents with  . Covid Positive    Patient tested positive for covid yesterday with an at home test, symptoms began on Monday. Has fever, headache, congestion, and loose stools. Patient is taking Vit D and drinking lots of water     . This visit was completed via telephone due to the restrictions of the COVID-19 pandemic. All issues as above were discussed and addressed but no physical exam was performed. If it was felt that the patient should be evaluated in the office, they were directed there. The patient verbally consented to this visit. Patient was unable to complete an audio/visual visit due to Technical difficulties, Lack of internet. Due to the catastrophic nature of the COVID-19 pandemic, this visit was done through audio contact only. . Location of the patient: home . Location of the provider: work . Those involved with this call:  . Provider: Marnee Guarneri, DNP . CMA: Louanna Raw, CMA . Front Desk/Registration: Jill Side  . Time spent on call: 21 minutes on the phone discussing health concerns. 15 minutes total spent in review of patient's record and preparation of their chart.  . I verified patient identity using two factors (patient name and date of birth). Patient consents verbally to being seen via telemedicine visit today.    COVID POSITIVE Tested positive for Covid on home kit yesterday, symptoms started on Monday 06/23/20.  She exposed to grand kids.  Has had Covid vaccines x 2, last in February.  Denies loss of taste or smell.  Feeling a little better today. Fever: yes -- little over 100 yesterday Cough: yes Shortness of breath: no Wheezing: no Chest pain: no Chest tightness: no Chest congestion: no Nasal congestion: yes Runny nose:  yes Post nasal drip: yes Sneezing: no Sore throat: no Swollen glands: no Sinus pressure: yes Headache: yes -- yesterday, better today Face pain: no Toothache: no Ear pain: none Ear pressure: none Eyes red/itching:no Eye drainage/crusting: no  Vomiting: no -- did have diarrhea today Rash: no Fatigue: yes Sick contacts: yes Strep contacts: no  Context: stable Recurrent sinusitis: no Relief with OTC cold/cough medications: yes  Treatments attempted: Nyquil, Vitamin D and fluids  Relevant past medical, surgical, family and social history reviewed and updated as indicated. Interim medical history since our last visit reviewed. Allergies and medications reviewed and updated.  Review of Systems  Constitutional: Positive for fatigue and fever. Negative for activity change and appetite change.  HENT: Positive for congestion, postnasal drip and rhinorrhea. Negative for ear discharge, ear pain, facial swelling, sinus pressure, sinus pain, sneezing, sore throat and voice change.   Eyes: Negative for pain and visual disturbance.  Respiratory: Positive for cough. Negative for chest tightness, shortness of breath and wheezing.   Cardiovascular: Negative for chest pain, palpitations and leg swelling.  Gastrointestinal: Positive for diarrhea. Negative for abdominal distention, abdominal pain, constipation, nausea and vomiting.  Endocrine: Negative.   Musculoskeletal: Positive for myalgias.  Neurological: Positive for headaches. Negative for dizziness and numbness.  Psychiatric/Behavioral: Negative.     Per HPI unless specifically indicated above     Objective:    There were no vitals taken for this visit.  Wt Readings from Last 3 Encounters:  02/22/20 245 lb (111.1 kg)  01/24/20 249 lb 9.6 oz (113.2 kg)  09/10/19 250 lb 12.8 oz (113.8 kg)    Physical Exam   Unable to perform due to telephone visit only.  Results for orders placed or performed in visit on 05/21/20  Novel  Coronavirus, NAA ([facility])   Specimen: Nasopharyngeal(NP) swabs in vial transport medium  Result Value Ref Range   SARS-CoV-2, NAA Not Detected Not Detected  SARS-COV-2, NAA 2 DAY TAT  Result Value Ref Range   SARS-CoV-2, NAA 2 DAY TAT Performed   Veritor Flu A/B Waived  Result Value Ref Range   Influenza A Negative Negative   Influenza B Negative Negative      Assessment & Plan:   Problem List Items Addressed This Visit      Other   Lab test positive for detection of COVID-19 virus    Home test positive, symptoms starting 06/23/20.  Exposure to grand children.  At this time is lower risk and vaccinated x 2 + improving.  Recommend she self quarantine x 10 days.  Scripts for Prednisone burst, Albuterol, and Tussionex sent in.   I would recommend starting Vitamin C 500 MG twice a day, Quercetin 250-500 MG twice a day, Zinc 75-100 MG daily, and Vitamin D3 2000-4000 units daily to help.  You may also use over the counter cold medications for symptom relief.  Ensure good hydration and rest.  Tylenol for fever if presents. If any increased SOB or CP immediately go to ER.  Return to office if any worsening or ongoing symptoms.         I discussed the assessment and treatment plan with the patient. The patient was provided an opportunity to ask questions and all were answered. The patient agreed with the plan and demonstrated an understanding of the instructions.   The patient was advised to call back or seek an in-person evaluation if the symptoms worsen or if the condition fails to improve as anticipated.   I provided 21+ minutes of time during this encounter.  Follow up plan: Return if symptoms worsen or fail to improve.

## 2020-06-26 ENCOUNTER — Other Ambulatory Visit: Payer: Self-pay | Admitting: Nurse Practitioner

## 2020-06-26 ENCOUNTER — Telehealth: Payer: Self-pay

## 2020-06-26 MED ORDER — HYDROCODONE-HOMATROPINE 5-1.5 MG/5ML PO SYRP: 5.0000 mL | ORAL_SOLUTION | Freq: Three times a day (TID) | ORAL | 0 refills | Status: DC | PRN

## 2020-06-26 NOTE — Telephone Encounter (Signed)
Sent this in to pharmacy for patient

## 2020-06-26 NOTE — Telephone Encounter (Signed)
Copied from [place] [id number]. Topic: Quick Communication - See Telephone Encounter >> Jun 26, 2020 12:37 PM Loma Boston wrote: CRM for notification. See Telephone encounter for: 06/26/20.chlorpheniramine-HYDROcodone (TUSSIONEX) 10-8 MG/5ML SURE please ck fax, [facility] has sent over request for a different med as they are out of this. Incoming fax

## 2020-06-26 NOTE — Telephone Encounter (Signed)
Pharmacy requesting new RX for Hycodan as Tussionex is on backorder.

## 2020-07-26 ENCOUNTER — Encounter: Payer: Self-pay | Admitting: Nurse Practitioner

## 2020-07-28 ENCOUNTER — Ambulatory Visit (INDEPENDENT_AMBULATORY_CARE_PROVIDER_SITE_OTHER): Payer: Medicare HMO | Admitting: Nurse Practitioner

## 2020-07-28 ENCOUNTER — Other Ambulatory Visit: Payer: Self-pay

## 2020-07-28 ENCOUNTER — Encounter: Payer: Self-pay | Admitting: Nurse Practitioner

## 2020-07-28 VITALS — BP 119/73 | HR 73 | Temp 98.3°F | Wt 244.4 lb

## 2020-07-28 DIAGNOSIS — G8929 Other chronic pain: Secondary | ICD-10-CM | POA: Insufficient documentation

## 2020-07-28 DIAGNOSIS — M170 Bilateral primary osteoarthritis of knee: Secondary | ICD-10-CM

## 2020-07-28 DIAGNOSIS — M545 Low back pain, unspecified: Secondary | ICD-10-CM

## 2020-07-28 DIAGNOSIS — N951 Menopausal and female climacteric states: Secondary | ICD-10-CM | POA: Diagnosis not present

## 2020-07-28 DIAGNOSIS — Z23 Encounter for immunization: Secondary | ICD-10-CM

## 2020-07-28 DIAGNOSIS — G2581 Restless legs syndrome: Secondary | ICD-10-CM

## 2020-07-28 DIAGNOSIS — R8281 Pyuria: Secondary | ICD-10-CM

## 2020-07-28 DIAGNOSIS — I700 Atherosclerosis of aorta: Secondary | ICD-10-CM

## 2020-07-28 DIAGNOSIS — Z6841 Body Mass Index (BMI) 40.0 and over, adult: Secondary | ICD-10-CM | POA: Diagnosis not present

## 2020-07-28 LAB — MICROSCOPIC EXAMINATION
Crystal Type: NONE SEEN
Crystals: NONE SEEN
Trichomonas, UA: NONE SEEN
Yeast, UA: NONE SEEN

## 2020-07-28 LAB — URINALYSIS, ROUTINE W REFLEX MICROSCOPIC
Bilirubin, UA: NEGATIVE
Glucose, UA: NEGATIVE
Leukocytes,UA: NEGATIVE
Nitrite, UA: NEGATIVE
Protein,UA: NEGATIVE
Specific Gravity, UA: 1.02 (ref 1.005–1.030)
Urobilinogen, Ur: 0.2 mg/dL (ref 0.2–1.0)
pH, UA: 5.5 (ref 5.0–7.5)

## 2020-07-28 MED ORDER — TIZANIDINE HCL 4 MG PO TABS: 4.0000 mg | ORAL_TABLET | Freq: Four times a day (QID) | ORAL | 0 refills | Status: DC | PRN

## 2020-07-28 MED ORDER — PREDNISONE 10 MG PO TABS: ORAL_TABLET | ORAL | 0 refills | Status: DC

## 2020-07-28 NOTE — Addendum Note (Signed)
Addended by: Marnee Guarneri T on: 07/28/2020 12:30 PM   Modules accepted: Orders

## 2020-07-28 NOTE — Patient Instructions (Signed)
Acute Back Pain, Adult Acute back pain is sudden and usually short-lived. It is often caused by an injury to the muscles and tissues in the back. The injury may result from:  A muscle or ligament getting overstretched or torn (strained). Ligaments are tissues that connect bones to each other. Lifting something improperly can cause a back strain.  Wear and tear (degeneration) of the spinal disks. Spinal disks are circular tissue that provide cushioning between the bones of the spine (vertebrae).  Twisting motions, such as while playing sports or doing yard work.  A hit to the back.  Arthritis. You may have a physical exam, lab tests, and imaging tests to find the cause of your pain. Acute back pain usually goes away with rest and home care. Follow these instructions at home: Managing pain, stiffness, and swelling  Treatment may include medicines for pain and inflammation that are taken by mouth or applied to the skin, prescription pain medicine, or muscle relaxants. Take over-the-counter and prescription medicines only as told by your health care provider.  Your health care provider may recommend applying ice during the first 24-48 hours after your pain starts. To do this: ? Put ice in a plastic bag. ? Place a towel between your skin and the bag. ? Leave the ice on for 20 minutes, 2-3 times a day.  If directed, apply heat to the affected area as often as told by your health care provider. Use the heat source that your health care provider recommends, such as a moist heat pack or a heating pad. ? Place a towel between your skin and the heat source. ? Leave the heat on for 20-30 minutes. ? Remove the heat if your skin turns bright red. This is especially important if you are unable to feel pain, heat, or cold. You have a greater risk of getting burned. Activity  Do not stay in bed. Staying in bed for more than 1-2 days can delay your recovery.  Sit up and stand up straight. Avoid leaning  forward when you sit or hunching over when you stand. ? If you work at a desk, sit close to it so you do not need to lean over. Keep your chin tucked in. Keep your neck drawn back, and keep your elbows bent at a 90-degree angle (right angle). ? Sit high and close to the steering wheel when you drive. Add lower back (lumbar) support to your car seat, if needed.  Take short walks on even surfaces as soon as you are able. Try to increase the length of time you walk each day.  Do not sit, drive, or stand in one place for more than 30 minutes at a time. Sitting or standing for long periods of time can put stress on your back.  Do not drive or use heavy machinery while taking prescription pain medicine.  Use proper lifting techniques. When you bend and lift, use positions that put less stress on your back: ? Bend your knees. ? Keep the load close to your body. ? Avoid twisting.  Exercise regularly as told by your health care provider. Exercising helps your back heal faster and helps prevent back injuries by keeping muscles strong and flexible.  Work with a physical therapist to make a safe exercise program, as recommended by your health care provider. Do any exercises as told by your physical therapist.   Lifestyle  Maintain a healthy weight. Extra weight puts stress on your back and makes it difficult to have   good posture.  Avoid activities or situations that make you feel anxious or stressed. Stress and anxiety increase muscle tension and can make back pain worse. Learn ways to manage anxiety and stress, such as through exercise. General instructions  Sleep on a firm mattress in a comfortable position. Try lying on your side with your knees slightly bent. If you lie on your back, put a pillow under your knees.  Follow your treatment plan as told by your health care provider. This may include: ? Cognitive or behavioral therapy. ? Acupuncture or massage therapy. ? Meditation or yoga. Contact  a health care provider if:  You have pain that is not relieved with rest or medicine.  You have increasing pain going down into your legs or buttocks.  Your pain does not improve after 2 weeks.  You have pain at night.  You lose weight without trying.  You have a fever or chills. Get help right away if:  You develop new bowel or bladder control problems.  You have unusual weakness or numbness in your arms or legs.  You develop nausea or vomiting.  You develop abdominal pain.  You feel faint. Summary  Acute back pain is sudden and usually short-lived.  Use proper lifting techniques. When you bend and lift, use positions that put less stress on your back.  Take over-the-counter and prescription medicines and apply heat or ice as directed by your health care provider. This information is not intended to replace advice given to you by your health care provider. Make sure you discuss any questions you have with your health care provider. Document Revised: 02/15/2020 Document Reviewed: 02/15/2020 Elsevier Patient Education  2021 [employer]vier Inc.  

## 2020-07-28 NOTE — Progress Notes (Signed)
BP 119/73   Pulse 73   Temp 98.3 F (36.8 C) (Oral)   Wt 244 lb 6.4 oz (110.9 kg)   SpO2 96%   BMI 39.45 kg/m    Subjective:    Patient ID: Erika Anderson, female    DOB: November 11, 1950, 70 y.o.   MRN: [id number]  HPI: Erika Anderson is a 70 y.o. female  Chief Complaint  Patient presents with  . Follow-up    Patient state she has been having a lot of back issues, which she had a back injury years ago and she went to therapy, but it has come back and she states she feel her tailbone may be bruised. Patient states it tends to interfere with her walking up the stairs and having trouble getting out of the bed. Also states she has been having a pain in your knee   INSOMNIA Reports she is not sleeping well, is taking Trazodone 150 MG.  Has RLS and is taking Requip -- 4 MG daily, sometimes takes 1 MG extra, + taking Gabapentin 300 MG TID.   Duration: chronic Satisfied with sleep quality: no Difficulty falling asleep: yes Difficulty staying asleep: yes Waking a few hours after sleep onset: yes Early morning awakenings: yes Daytime hypersomnolence: no Wakes feeling refreshed: no Good sleep hygiene: yes Apnea: no Snoring: yes Depressed/anxious mood: yes Recent stress: no Restless legs/nocturnal leg cramps: yes Chronic pain/arthritis: yes History of sleep study: no Treatments attempted: melatonin   MENOPAUSE Taking Celexa for menopausal symptoms. Mood status: stable Satisfied with current treatment?: yes Symptom severity: moderate  Duration of current treatment : chronic Side effects: no Medication compliance: good compliance Psychotherapy/counseling: none Depressed mood: no Anxious mood: no Anhedonia: no Significant weight loss or gain: no Insomnia: yes hard to stay asleep Fatigue: no Feelings of worthlessness or guilt: no Impaired concentration/indecisiveness: no Suicidal ideations: no Hopelessness: no Crying spells: no Depression screen [facility] 2/9 07/28/2020  02/22/2020 01/24/2020 01/19/2019 01/17/2019  Decreased Interest 0 0 0 0 0  Down, Depressed, Hopeless 0 0 0 0 0  PHQ - 2 Score 0 0 0 0 0  Altered sleeping 0 - - 0 -  Tired, decreased energy 0 - - 0 -  Change in appetite 0 - - 0 -  Feeling bad or failure about yourself  0 - - 0 -  Trouble concentrating 0 - - 0 -  Moving slowly or fidgety/restless 0 - - 0 -  Suicidal thoughts 0 - - 0 -  PHQ-9 Score 0 - - 0 -  Difficult doing work/chores Not difficult at all - - Not difficult at all -   BACK PAIN Hurt back two years ago, feels like this is getting worse, can barely get up steps -- going back to way it was when first hurt it. States tailbone hurts all the time.  The initial injury was from laying on back and painting.  Reports both knees hurt too, right worse then left.  Has history of same and saw ortho where she received steroid shot which helped knee.  The back pain is making her ADLs limited, including hard to get on toilet and off of it.  Has tried physical therapy in past, but did not have benefit from this.  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  Knee imaging noted right side medial and patellofemoral osteoarthritis with medial compartment. Duration: months Mechanism of injury: no trauma Location: R>L, midline and low back  Onset: gradual Severity: 4/10 current, but gets to 10/10 Quality: dull, aching and throbbing Frequency: constant Radiation: none Aggravating factors: lifting, movement, walking and bending Alleviating factors: heat Status: worse Treatments attempted: rest, heat, APAP, ibuprofen and physical therapy  Relief with NSAIDs?: no Nighttime pain:  no Paresthesias / decreased sensation:  no Bowel / bladder incontinence:  no Fevers:  no Dysuria / urinary frequency:  no  Relevant past medical, surgical, family and social history reviewed and updated as indicated. Interim medical history since our last  visit reviewed. Allergies and medications reviewed and updated.  Review of Systems  Constitutional: Negative.   Cardiovascular: Negative.   Genitourinary: Negative.   Musculoskeletal: Positive for arthralgias and back pain.  Psychiatric/Behavioral: Negative.     Per HPI unless specifically indicated above     Objective:    BP 119/73   Pulse 73   Temp 98.3 F (36.8 C) (Oral)   Wt 244 lb 6.4 oz (110.9 kg)   SpO2 96%   BMI 39.45 kg/m   Wt Readings from Last 3 Encounters:  07/28/20 244 lb 6.4 oz (110.9 kg)  02/22/20 245 lb (111.1 kg)  01/24/20 249 lb 9.6 oz (113.2 kg)    Physical Exam Vitals and nursing note reviewed.  Constitutional:      General: She is awake. She is not in acute distress.    Appearance: She is well-developed and overweight. She is not ill-appearing.  HENT:     Head: Normocephalic.     Right Ear: Hearing normal.     Left Ear: Hearing normal.  Eyes:     General: Lids are normal.        Right eye: No discharge.        Left eye: No discharge.     Conjunctiva/sclera: Conjunctivae normal.     Pupils: Pupils are equal, round, and reactive to light.  Neck:     Vascular: No carotid bruit.  Cardiovascular:     Rate and Rhythm: Normal rate and regular rhythm.     Heart sounds: Normal heart sounds. No murmur heard. No gallop.   Pulmonary:     Effort: Pulmonary effort is normal. No accessory muscle usage or respiratory distress.     Breath sounds: Normal breath sounds.  Abdominal:     General: Bowel sounds are normal.     Palpations: Abdomen is soft.  Musculoskeletal:     Cervical back: Normal range of motion and neck supple.     Lumbar back: No swelling, edema, tenderness or bony tenderness. Normal range of motion. Positive right straight leg raise test. Negative left straight leg raise test.     Right knee: Crepitus present. No swelling or erythema. Normal range of motion. No tenderness. Normal alignment, normal meniscus and normal patellar mobility.      Instability Tests: Medial McMurray test negative and lateral McMurray test negative.     Left knee: Crepitus present. No swelling or erythema. Normal range of motion. No tenderness. Normal alignment, normal meniscus and normal patellar mobility.     Instability Tests: Medial McMurray test negative and lateral McMurray test negative.     Right lower leg: No edema.     Left lower leg: No edema.     Comments: Mild tenderness with flexion and extension reported.  Slightly antalgic gait noted.  No decrease lumbar ROM, but tenderness with flexion and extension reported.  Skin:    General: Skin is warm and dry.  Neurological:     Mental Status: She  is alert and oriented to person, place, and time.  Psychiatric:        Attention and Perception: Attention normal.        Mood and Affect: Mood normal.        Speech: Speech normal.        Behavior: Behavior normal. Behavior is cooperative.     Results for orders placed or performed in visit on 05/21/20  Novel Coronavirus, NAA ([facility])   Specimen: Nasopharyngeal(NP) swabs in vial transport medium  Result Value Ref Range   SARS-CoV-2, NAA Not Detected Not Detected  SARS-COV-2, NAA 2 DAY TAT  Result Value Ref Range   SARS-CoV-2, NAA 2 DAY TAT Performed   Veritor Flu A/B Waived  Result Value Ref Range   Influenza A Negative Negative   Influenza B Negative Negative      Assessment & Plan:   Problem List Items Addressed This Visit      Cardiovascular and Mediastinum   Aortic atherosclerosis ([place]) - Primary    Noted on CT imaging 05/03/18 -- educated patient on this.  Recommend starting Baby ASA 81 MG daily and will check lipid panel next visit, consider addition of statin for prevention.  Non-smoker.        Musculoskeletal and Integument   Osteoarthritis of both knees    Ongoing issue, could consider steroid injection next visit, but pain is more presenting to back at this time.  Recommend PT in future if ongoing.  Would benefit from  this.      Relevant Medications   predniSONE (DELTASONE) 10 MG tablet   tiZANidine (ZANAFLEX) 4 MG tablet     Other   Restless legs syndrome (RLS)    Chronic and stable with Requip and Gabapentin.  Continue current regimen and adjust as needed.  Refills sent.  Monitor kidney function closely with medications.  CMP next visit.      Menopausal vasomotor syndrome    Improved with Celexa 20 MG, this is max dose for age.  Continue current regimen and adjust as needed.  Would avoid HRT due to maternal history of ovarian cancer.  Refills sent in on Celexa.  Return in 6 months.      BMI 40.0-44.9, adult (HCC)    Recommended eating smaller high protein, low fat meals more frequently and exercising 30 mins a day 5 times a week with a goal of 10-15lb weight loss in the next 3 months. Patient voiced their understanding and motivation to adhere to these recommendations.       Chronic bilateral low back pain without sciatica    Chronic, with acute flare and know Degenerative Disc.  At this time will treat with Prednisone taper and Tizanidine.  Scripts sent.  Recommend alternating heat and ice at home + scheduling Tylenol 1000 MG TID (max dose).  Avoid Ibuprofen unless needed.  Obtain urine today. No red flag symptoms at this time.  Return to office in 4 weeks for follow-up.  Consider ortho or PT if ongoing.      Relevant Medications   predniSONE (DELTASONE) 10 MG tablet   tiZANidine (ZANAFLEX) 4 MG tablet   Other Relevant Orders   Urinalysis, Routine w reflex microscopic    Other Visit Diagnoses    Needs flu shot       Relevant Orders   Flu Vaccine QUAD High Dose(Fluad) (Completed)       Follow up plan: Return in about 4 weeks (around 08/25/2020) for Back pain.

## 2020-07-28 NOTE — Assessment & Plan Note (Signed)
Recommended eating smaller high protein, low fat meals more frequently and exercising 30 mins a day 5 times a week with a goal of 10-15lb weight loss in the next 3 months. Patient voiced their understanding and motivation to adhere to these recommendations.  

## 2020-07-28 NOTE — Assessment & Plan Note (Signed)
Ongoing issue, could consider steroid injection next visit, but pain is more presenting to back at this time.  Recommend PT in future if ongoing.  Would benefit from this.

## 2020-07-28 NOTE — Assessment & Plan Note (Signed)
Improved with Celexa 20 MG, this is max dose for age.  Continue current regimen and adjust as needed.  Would avoid HRT due to maternal history of ovarian cancer.  Refills sent in on Celexa.  Return in 6 months. °

## 2020-07-28 NOTE — Assessment & Plan Note (Signed)
Noted on CT imaging 05/03/18 -- educated patient on this.  Recommend starting Baby ASA 81 MG daily and will check lipid panel next visit, consider addition of statin for prevention.  Non-smoker.

## 2020-07-28 NOTE — Assessment & Plan Note (Signed)
Chronic and stable with Requip and Gabapentin.  Continue current regimen and adjust as needed.  Refills sent.  Monitor kidney function closely with medications.  CMP next visit.

## 2020-07-28 NOTE — Assessment & Plan Note (Signed)
Chronic, with acute flare and know Degenerative Disc.  At this time will treat with Prednisone taper and Tizanidine.  Scripts sent.  Recommend alternating heat and ice at home + scheduling Tylenol 1000 MG TID (max dose).  Avoid Ibuprofen unless needed.  Obtain urine today. No red flag symptoms at this time.  Return to office in 4 weeks for follow-up.  Consider ortho or PT if ongoing.

## 2020-07-30 LAB — URINE CULTURE: Organism ID, Bacteria: NO GROWTH

## 2020-08-19 ENCOUNTER — Other Ambulatory Visit: Payer: Self-pay | Admitting: Nurse Practitioner

## 2020-08-19 NOTE — Telephone Encounter (Signed)
Requested Prescriptions  Pending Prescriptions Disp Refills  . gabapentin (NEURONTIN) 300 MG capsule [Pharmacy Med Name: Gabapentin 300 MG Oral Capsule] 90 capsule 0    Sig: Take 1 capsule by mouth three times daily as needed     Neurology: Anticonvulsants - gabapentin Passed - 08/19/2020  7:23 PM      Passed - Valid encounter within last 12 months    Recent Outpatient Visits          3 weeks ago Aortic atherosclerosis ([place])   [place], Henrine Screws T, NP   1 month ago Lab test positive for detection of COVID-19 virus   [facility] [place], [place] T, NP   3 months ago Viral URI   [place] Myles Gip, DO   6 months ago Routine general medical examination at a health care facility   [facility], [place] T, NP   8 months ago Menopausal vasomotor syndrome   [place], Barbaraann Faster, NP      Future Appointments            In 1 week Cannady, Barbaraann Faster, NP [facility]   In 6 months  [place]           . tiZANidine (ZANAFLEX) 4 MG tablet [Pharmacy Med Name: tiZANidine HCl 4 MG Oral Tablet] 30 tablet 0    Sig: TAKE 1 TABLET BY MOUTH EVERY 6 HOURS AS NEEDED FOR MUSCLE SPASM     Not Delegated - Cardiovascular:  Alpha-2 Agonists - tizanidine Failed - 08/19/2020  7:23 PM      Failed - This refill cannot be delegated      Passed - Valid encounter within last 6 months    Recent Outpatient Visits          3 weeks ago Aortic atherosclerosis ([place])   [place] Cannady, Henrine Screws T, NP   1 month ago Lab test positive for detection of COVID-19 virus   [place], [place] T, NP   3 months ago Viral URI   [place] Myles Gip, DO   6 months ago Routine general medical examination at a health care facility   [facility], [place] T, NP   8 months ago Menopausal vasomotor syndrome   [place], Barbaraann Faster, NP      Future Appointments            In 1 week Cannady, Barbaraann Faster, NP [facility]   In 6 months  [facility], [place]

## 2020-08-19 NOTE — Telephone Encounter (Signed)
Requested medications are due for refill today.  Yes  Requested medications are on the active medications list.  Yes  Last refill. 07/28/2020  Future visit scheduled.   yes  Notes to clinic.  Not delegated

## 2020-08-20 NOTE — Telephone Encounter (Signed)
Last seen 07/28/20

## 2020-08-26 ENCOUNTER — Ambulatory Visit: Payer: Medicare HMO | Admitting: Internal Medicine

## 2020-08-29 ENCOUNTER — Ambulatory Visit (INDEPENDENT_AMBULATORY_CARE_PROVIDER_SITE_OTHER): Payer: Medicare HMO | Admitting: Nurse Practitioner

## 2020-08-29 ENCOUNTER — Encounter: Payer: Self-pay | Admitting: Nurse Practitioner

## 2020-08-29 ENCOUNTER — Other Ambulatory Visit: Payer: Self-pay

## 2020-08-29 VITALS — BP 122/66 | HR 62

## 2020-08-29 DIAGNOSIS — E7800 Pure hypercholesterolemia, unspecified: Secondary | ICD-10-CM

## 2020-08-29 DIAGNOSIS — M545 Low back pain, unspecified: Secondary | ICD-10-CM | POA: Diagnosis not present

## 2020-08-29 DIAGNOSIS — M170 Bilateral primary osteoarthritis of knee: Secondary | ICD-10-CM

## 2020-08-29 DIAGNOSIS — I700 Atherosclerosis of aorta: Secondary | ICD-10-CM

## 2020-08-29 DIAGNOSIS — G8929 Other chronic pain: Secondary | ICD-10-CM | POA: Diagnosis not present

## 2020-08-29 NOTE — Progress Notes (Signed)
Established Patient Office Visit  Subjective:  Patient ID: Erika Anderson, female    DOB: [birth year]  Age: 71 y.o. MRN: [id number]  CC:  Chief Complaint  Patient presents with  . Back Pain    Patient is here for follow up on back pain.     HPI Earnestene Angello [facility] presents for follow-up back pain.  Back pain and bilateral knee pain significantly improved with prednisone and muscle relaxer. She notices more pain/tightness in the morning and after sitting for long periods of time. She has been taking tylenol as needed and doing stretches as well. She is not interested in physical therapy at this time. She is also trying to lose weight.   Past Medical History:  Diagnosis Date  . Hyperlipidemia   . Obesity (BMI 30-39.9)   . Restless legs syndrome (RLS) 12/28/2014  . RLS (restless legs syndrome)     Past Surgical History:  Procedure Laterality Date  . ABDOMINAL HYSTERECTOMY     ovaries remain  . HEMORROIDECTOMY      Family History  Problem Relation Age of Onset  . Cancer Mother        ovarian  . Arthritis Sister   . Heart disease Brother        MI  . Stroke Paternal Grandmother   . Cancer Paternal Grandfather        lung  . Breast cancer Neg Hx     Social History   Socioeconomic History  . Marital status: Married    Spouse name: Not on file  . Number of children: Not on file  . Years of education: 23  . Highest education level: High school graduate  Occupational History  . Occupation: retired  Tobacco Use  . Smoking status: Never Smoker  . Smokeless tobacco: Never Used  Vaping Use  . Vaping Use: Never used  Substance and Sexual Activity  . Alcohol use: No    Alcohol/week: 0.0 standard drinks  . Drug use: No  . Sexual activity: Not Currently  Other Topics Concern  . Not on file  Social History Narrative   Helps take care of grandchildren    Social Determinants of Health   Financial Resource Strain: Low Risk   . Difficulty of Paying Living  Expenses: Not hard at all  Food Insecurity: No Food Insecurity  . Worried About Charity fundraiser in the Last Year: Never true  . Ran Out of Food in the Last Year: Never true  Transportation Needs: No Transportation Needs  . Lack of Transportation (Medical): No  . Lack of Transportation (Non-Medical): No  Physical Activity: Inactive  . Days of Exercise per Week: 0 days  . Minutes of Exercise per Session: 0 min  Stress: No Stress Concern Present  . Feeling of Stress : Not at all  Social Connections: Not on file  Intimate Partner Violence: Not on file    Outpatient Medications Prior to Visit  Medication Sig Dispense Refill  . tiZANidine (ZANAFLEX) 4 MG tablet TAKE 1 TABLET BY MOUTH EVERY 6 HOURS AS NEEDED FOR MUSCLE SPASM 30 tablet 0  . albuterol (VENTOLIN HFA) 108 (90 Base) MCG/ACT inhaler Inhale 2 puffs into the lungs every 6 (six) hours as needed for wheezing or shortness of breath. 18 g 0  . aspirin EC 81 MG tablet Take 81 mg by mouth daily. Swallow whole.    . citalopram (CELEXA) 20 MG tablet Take 1 tablet (20 mg total) by mouth daily. 90 tablet 4  .  Fish Oil-Cholecalciferol (OMEGA-3 FISH OIL/VITAMIN D3 PO) Take by mouth.    . gabapentin (NEURONTIN) 300 MG capsule Take 1 capsule by mouth three times daily as needed 90 capsule 0  . HYDROcodone-homatropine (HYCODAN) 5-1.5 MG/5ML syrup Take 5 mLs by mouth every 8 (eight) hours as needed for cough. (Patient not taking: Reported on 07/28/2020) 120 mL 0  . meloxicam (MOBIC) 7.5 MG tablet Take 1 tablet (7.5 mg total) by mouth daily. (Patient not taking: Reported on 07/28/2020) 30 tablet 4  . predniSONE (DELTASONE) 10 MG tablet Take 6 tablets by mouth daily for 2 days, then reduce by 1 tablet every 2 days until gone 42 tablet 0  . rOPINIRole (REQUIP) 1 MG tablet Take 1 tablet (1 mg total) by mouth 4 (four) times daily. 360 tablet 4  . traZODone (DESYREL) 50 MG tablet TAKE 1/2 TO 1 (ONE-HALF TO ONE) TABLET BY MOUTH AT BEDTIME AS NEEDED FOR  SLEEP 90 tablet 4   No facility-administered medications prior to visit.    No Known Allergies  ROS Review of Systems  Constitutional: Positive for fatigue (chronic).  Genitourinary: Positive for frequency (improving).  Musculoskeletal: Positive for back pain (improved).  Neurological: Negative.       Objective:    Physical Exam Vitals and nursing note reviewed.  Constitutional:      General: She is not in acute distress.    Appearance: Normal appearance.  HENT:     Head: Normocephalic and atraumatic.  Eyes:     Conjunctiva/sclera: Conjunctivae normal.  Neck:     Vascular: No carotid bruit.  Cardiovascular:     Rate and Rhythm: Normal rate and regular rhythm.     Pulses: Normal pulses.     Heart sounds: Normal heart sounds.  Pulmonary:     Effort: Pulmonary effort is normal.     Breath sounds: Normal breath sounds.  Musculoskeletal:        General: No tenderness.     Cervical back: Normal range of motion.     Comments: Full lumbar range of motion, however tenderness with flexion and extension  Skin:    General: Skin is warm and dry.  Neurological:     General: No focal deficit present.     Mental Status: She is alert and oriented to person, place, and time.     Motor: No weakness.  Psychiatric:        Mood and Affect: Mood normal.        Behavior: Behavior normal.        Thought Content: Thought content normal.        Judgment: Judgment normal.     BP 122/66 (BP Location: Left Arm, Patient Position: Sitting)   Pulse 62  Wt Readings from Last 3 Encounters:  07/28/20 244 lb 6.4 oz (110.9 kg)  02/22/20 245 lb (111.1 kg)  01/24/20 249 lb 9.6 oz (113.2 kg)     Health Maintenance Due  Topic Date Due  . COVID-19 Vaccine (3 - Booster for Moderna series) 01/29/2020    There are no preventive care reminders to display for this patient.  Lab Results  Component Value Date   TSH 1.590 01/24/2020   Lab Results  Component Value Date   WBC 7.8 01/24/2020    HGB 13.5 01/24/2020   HCT 40.8 01/24/2020   MCV 92 01/24/2020   PLT 211 01/24/2020   Lab Results  Component Value Date   NA 140 01/24/2020   K 4.8 01/24/2020   CO2 27  01/24/2020   GLUCOSE 94 01/24/2020   BUN 22 01/24/2020   CREATININE 0.81 01/24/2020   BILITOT 0.4 01/24/2020   ALKPHOS 88 01/24/2020   AST 16 01/24/2020   ALT 16 01/24/2020   PROT 6.5 01/24/2020   ALBUMIN 4.4 01/24/2020   CALCIUM 9.4 01/24/2020   ANIONGAP 9 05/03/2018   Lab Results  Component Value Date   CHOL 217 (H) 01/24/2020   Lab Results  Component Value Date   HDL 52 01/24/2020   Lab Results  Component Value Date   LDLCALC 144 (H) 01/24/2020   Lab Results  Component Value Date   TRIG 115 01/24/2020      Assessment & Plan:   Problem List Items Addressed This Visit      Cardiovascular and Mediastinum   Aortic atherosclerosis ([place]) - Primary    Started aspirin last visit. No signs or symptoms of bleeding. Will check lipid panel today.      Relevant Orders   Lipid Panel w/o Chol/HDL Ratio   Comp Met (CMET)     Musculoskeletal and Integument   Osteoarthritis of both knees    Chronic, improved. Bilateral knee pain improved with prednisone taper. She is not interested in PT at this time. Consider in the future if pain persists. Follow-up if symptoms worsen.         Other   Elevated LDL cholesterol level    With aortic atherosclerosis will check lipid panel today.       Chronic bilateral low back pain without sciatica    Chronic, improving. Had significant relief with prednisone and tizanidine. Continue ice/heat and tylenol at home. She is not interested going to physical therapy, she has been doing stretches at home. Continue stretches as well. Follow-up if symptoms worsen.         No orders of the defined types were placed in this encounter.   Follow-up: Return in about 5 months (around 01/29/2021) for hld, back pain.    Charyl Dancer, NP

## 2020-08-29 NOTE — Assessment & Plan Note (Signed)
Chronic, improved. Bilateral knee pain improved with prednisone taper. She is not interested in PT at this time. Consider in the future if pain persists. Follow-up if symptoms worsen.

## 2020-08-29 NOTE — Assessment & Plan Note (Signed)
Chronic, improving. Had significant relief with prednisone and tizanidine. Continue ice/heat and tylenol at home. She is not interested going to physical therapy, she has been doing stretches at home. Continue stretches as well. Follow-up if symptoms worsen.

## 2020-08-29 NOTE — Assessment & Plan Note (Signed)
Started aspirin last visit. No signs or symptoms of bleeding. Will check lipid panel today.

## 2020-08-29 NOTE — Patient Instructions (Signed)
It was great to see you!  Continue taking tylenol as needed for pain and using heat or ice on your back. Also keep doing your stretches at home.   Let's follow-up in 5 months, sooner if you have concerns.  Take care,  Vance Peper, NP

## 2020-08-29 NOTE — Assessment & Plan Note (Signed)
With aortic atherosclerosis will check lipid panel today.

## 2020-08-30 LAB — COMPREHENSIVE METABOLIC PANEL
ALT: 17 IU/L (ref 0–32)
AST: 21 IU/L (ref 0–40)
Albumin/Globulin Ratio: 1.6 (ref 1.2–2.2)
Albumin: 4.4 g/dL (ref 3.8–4.8)
Alkaline Phosphatase: 72 IU/L (ref 44–121)
BUN/Creatinine Ratio: 26 (ref 12–28)
BUN: 21 mg/dL (ref 8–27)
Bilirubin Total: 0.4 mg/dL (ref 0.0–1.2)
CO2: 24 mmol/L (ref 20–29)
Calcium: 10.3 mg/dL (ref 8.7–10.3)
Chloride: 101 mmol/L (ref 96–106)
Creatinine, Ser: 0.82 mg/dL (ref 0.57–1.00)
Globulin, Total: 2.7 g/dL (ref 1.5–4.5)
Glucose: 94 mg/dL (ref 65–99)
Potassium: 4.9 mmol/L (ref 3.5–5.2)
Sodium: 140 mmol/L (ref 134–144)
Total Protein: 7.1 g/dL (ref 6.0–8.5)
eGFR: 78 mL/min/{1.73_m2} (ref >59)

## 2020-08-30 LAB — LIPID PANEL W/O CHOL/HDL RATIO
Cholesterol, Total: 209 mg/dL — ABNORMAL HIGH (ref 100–199)
HDL: 45 mg/dL (ref >39)
LDL Chol Calc (NIH): 136 mg/dL — ABNORMAL HIGH (ref 0–99)
Triglycerides: 158 mg/dL — ABNORMAL HIGH (ref 0–149)
VLDL Cholesterol Cal: 28 mg/dL (ref 5–40)

## 2020-09-21 ENCOUNTER — Other Ambulatory Visit: Payer: Self-pay | Admitting: Nurse Practitioner

## 2020-09-21 NOTE — Telephone Encounter (Signed)
Requested medication (s) are due for refill today: yes  Requested medication (s) are on the active medication list: yes  Last refill:  08/20/20  Future visit scheduled: yes  Notes to clinic:  med not delegated to NT to RF   Requested Prescriptions  Pending Prescriptions Disp Refills   tiZANidine (ZANAFLEX) 4 MG tablet [Pharmacy Med Name: tiZANidine HCl 4 MG Oral Tablet] 30 tablet 0    Sig: TAKE 1 TABLET BY MOUTH EVERY 6 HOURS AS NEEDED FOR MUSCLE SPASM      Not Delegated - Cardiovascular:  Alpha-2 Agonists - tizanidine Failed - 09/21/2020  6:31 AM      Failed - This refill cannot be delegated      Passed - Valid encounter within last 6 months    Recent Outpatient Visits           3 weeks ago Aortic atherosclerosis ([place])   [place], Lauren A, NP   1 month ago Aortic atherosclerosis ([place])   [place] Cannady, Jolene T, NP   2 months ago Lab test positive for detection of COVID-19 virus   [place], [place] T, NP   4 months ago Viral URI   [place] Myles Gip, DO   8 months ago Routine general medical examination at a health care facility   [facility], Barbaraann Faster, NP       Future Appointments             In 4 months Cannady, Barbaraann Faster, NP [facility] [facility], [place]   In 5 months  [facility]

## 2020-09-22 NOTE — Telephone Encounter (Signed)
Pt scheduled 8/22

## 2020-10-20 ENCOUNTER — Other Ambulatory Visit: Payer: Self-pay | Admitting: Nurse Practitioner

## 2020-10-20 NOTE — Telephone Encounter (Signed)
Requested Prescriptions  Pending Prescriptions Disp Refills  . gabapentin (NEURONTIN) 300 MG capsule [Pharmacy Med Name: Gabapentin 300 MG Oral Capsule] 90 capsule 0    Sig: Take 1 capsule by mouth three times daily as needed     Neurology: Anticonvulsants - gabapentin Passed - 10/20/2020  6:28 PM      Passed - Valid encounter within last 12 months    Recent Outpatient Visits          1 month ago Aortic atherosclerosis ([place])   [place], Lauren A, NP   2 months ago Aortic atherosclerosis ([place])   [place], Jolene T, NP   3 months ago Lab test positive for detection of COVID-19 virus   [employer], [place] T, NP   5 months ago Viral URI   [place] Myles Gip, DO   9 months ago Routine general medical examination at a health care facility   [facility], Barbaraann Faster, NP      Future Appointments            In 3 months Cannady, Barbaraann Faster, NP [facility]   In 4 months  [facility]           . tiZANidine (ZANAFLEX) 4 MG tablet [Pharmacy Med Name: tiZANidine HCl 4 MG Oral Tablet] 30 tablet 0    Sig: TAKE 1 TABLET BY MOUTH EVERY 6 HOURS AS NEEDED FOR MUSCLE SPASM     Not Delegated - Cardiovascular:  Alpha-2 Agonists - tizanidine Failed - 10/20/2020  6:28 PM      Failed - This refill cannot be delegated      Passed - Valid encounter within last 6 months    Recent Outpatient Visits          1 month ago Aortic atherosclerosis ([place])   [place], Lauren A, NP   2 months ago Aortic atherosclerosis ([place])   [place] Cannady, Jolene T, NP   3 months ago Lab test positive for detection of COVID-19 virus   [place], [place] T, NP   5 months ago Viral URI   [place] Myles Gip, DO   9 months ago Routine general medical examination at a health care facility   [facility], Barbaraann Faster, NP      Future Appointments            In 3 months Cannady, Barbaraann Faster, NP [facility] [facility], [place]   In 4 months  [facility]

## 2020-10-20 NOTE — Telephone Encounter (Signed)
Requested medications are due for refill today.  yes  Requested medications are on the active medications list.  yes  Last refill. 09/22/2020  Future visit scheduled.   yes  Notes to clinic.  Medication not delegated.

## 2020-10-21 NOTE — Telephone Encounter (Signed)
Pt last apt on 08/29/2020 next apt on 01/26/2021

## 2020-10-29 ENCOUNTER — Other Ambulatory Visit: Payer: Self-pay

## 2020-10-29 ENCOUNTER — Ambulatory Visit (INDEPENDENT_AMBULATORY_CARE_PROVIDER_SITE_OTHER): Payer: Medicare HMO | Admitting: Nurse Practitioner

## 2020-10-29 ENCOUNTER — Encounter: Payer: Self-pay | Admitting: Nurse Practitioner

## 2020-10-29 VITALS — BP 131/73 | HR 69 | Temp 98.6°F | Wt 236.6 lb

## 2020-10-29 DIAGNOSIS — L0390 Cellulitis, unspecified: Secondary | ICD-10-CM | POA: Insufficient documentation

## 2020-10-29 DIAGNOSIS — L03116 Cellulitis of left lower limb: Secondary | ICD-10-CM | POA: Diagnosis not present

## 2020-10-29 MED ORDER — MUPIROCIN 2 % EX OINT: 1.0000 "application " | TOPICAL_OINTMENT | Freq: Two times a day (BID) | CUTANEOUS | 0 refills | Status: DC

## 2020-10-29 MED ORDER — DOXYCYCLINE HYCLATE 100 MG PO TABS: 100.0000 mg | ORAL_TABLET | Freq: Two times a day (BID) | ORAL | 0 refills | Status: DC

## 2020-10-29 NOTE — Progress Notes (Signed)
BP 131/73   Pulse 69   Temp 98.6 F (37 C) (Oral)   Wt 236 lb 9.6 oz (107.3 kg)   SpO2 97%   BMI 38.19 kg/m    Subjective:    Patient ID: Erika Anderson, female    DOB: 06-24-1950, 70 y.o.   MRN: [id number]  HPI: Erika Anderson is a 70 y.o. female  Chief Complaint  Patient presents with  . Foot Itch    Patient states she first noticed a spot or two come up on her right foot over the weekend. Patient states she was looking to make sure it was a bluster and noticed some clear drainage come from one of the spot on the back of her heel. Patient states the area of concern are very sore.    SKIN INFECTION To back of left foot at Achille's area and x 1 to lateral left heel.  First noticed it over weekend, had been out working in yard.  Does not recall bug biting her. Duration: days Location: left Achille's area History of trauma in area: no Pain: yes Quality: yes Severity: mild Redness: yes Swelling: yes Oozing: no Pus: no Fevers: no Nausea/vomiting: no Status: stable Treatments attempted:none  Tetanus: UTD  Relevant past medical, surgical, family and social history reviewed and updated as indicated. Interim medical history since our last visit reviewed. Allergies and medications reviewed and updated.  Review of Systems  Constitutional: Negative.   Respiratory: Negative.   Cardiovascular: Negative.   Skin: Positive for wound.  Neurological: Negative.   Psychiatric/Behavioral: Negative.     Per HPI unless specifically indicated above     Objective:    BP 131/73   Pulse 69   Temp 98.6 F (37 C) (Oral)   Wt 236 lb 9.6 oz (107.3 kg)   SpO2 97%   BMI 38.19 kg/m   Wt Readings from Last 3 Encounters:  10/29/20 236 lb 9.6 oz (107.3 kg)  07/28/20 244 lb 6.4 oz (110.9 kg)  02/22/20 245 lb (111.1 kg)    Physical Exam Vitals and nursing note reviewed.  Constitutional:      General: She is awake. She is not in acute distress.    Appearance: She is  well-developed and well-groomed. She is obese. She is not ill-appearing or toxic-appearing.  HENT:     Head: Normocephalic.     Right Ear: Hearing normal.     Left Ear: Hearing normal.  Eyes:     General: Lids are normal.        Right eye: No discharge.        Left eye: No discharge.     Conjunctiva/sclera: Conjunctivae normal.     Pupils: Pupils are equal, round, and reactive to light.  Cardiovascular:     Rate and Rhythm: Normal rate and regular rhythm.     Heart sounds: Normal heart sounds. No murmur heard. No gallop.   Pulmonary:     Effort: Pulmonary effort is normal. No accessory muscle usage or respiratory distress.     Breath sounds: Normal breath sounds.  Abdominal:     General: Bowel sounds are normal.     Palpations: Abdomen is soft.  Musculoskeletal:     Cervical back: Normal range of motion and neck supple.     Right lower leg: No edema.     Left lower leg: No edema.  Skin:    General: Skin is warm and dry.     Findings: Wound present.  Comments: One small pea-sized intact blister-like area to lateral aspect left heel, mild erythema.  No warmth or drainage.  Varicose veins present.  Neurological:     Mental Status: She is alert and oriented to person, place, and time.  Psychiatric:        Attention and Perception: Attention normal.        Mood and Affect: Mood normal.        Speech: Speech normal.        Behavior: Behavior normal. Behavior is cooperative.        Thought Content: Thought content normal.    Results for orders placed or performed in visit on 08/29/20  Lipid Panel w/o Chol/HDL Ratio  Result Value Ref Range   Cholesterol, Total 209 (H) 100 - 199 mg/dL   Triglycerides 158 (H) 0 - 149 mg/dL   HDL 45 >39 mg/dL   VLDL Cholesterol Cal 28 5 - 40 mg/dL   LDL Chol Calc (NIH) 136 (H) 0 - 99 mg/dL  Comp Met (CMET)  Result Value Ref Range   Glucose 94 65 - 99 mg/dL   BUN 21 8 - 27 mg/dL   Creatinine, Ser 0.82 0.57 - 1.00 mg/dL   eGFR 78 >59  mL/min/1.73   BUN/Creatinine Ratio 26 12 - 28   Sodium 140 134 - 144 mmol/L   Potassium 4.9 3.5 - 5.2 mmol/L   Chloride 101 96 - 106 mmol/L   CO2 24 20 - 29 mmol/L   Calcium 10.3 8.7 - 10.3 mg/dL   Total Protein 7.1 6.0 - 8.5 g/dL   Albumin 4.4 3.8 - 4.8 g/dL   Globulin, Total 2.7 1.5 - 4.5 g/dL   Albumin/Globulin Ratio 1.6 1.2 - 2.2   Bilirubin Total 0.4 0.0 - 1.2 mg/dL   Alkaline Phosphatase 72 44 - 121 IU/L   AST 21 0 - 40 IU/L   ALT 17 0 - 32 IU/L      Assessment & Plan:   Problem List Items Addressed This Visit      Other   Cellulitis - Primary    Acute x 4 days to left Achille's area.  Suspect bug bite.  Will treat at this time with Doxycycline 100 MG BID x 7 days and Mupirocin ointment.  Recommend she apply warm compresses to area QID for 20 minutes each.  Monitor area and if worsening erythema or warmth immediately notify provider.  Return in one week for follow-up.          Follow up plan: Return in about 1 week (around 11/05/2020) for Cellulitis.

## 2020-10-29 NOTE — Assessment & Plan Note (Signed)
Acute x 4 days to left Achille's area.  Suspect bug bite.  Will treat at this time with Doxycycline 100 MG BID x 7 days and Mupirocin ointment.  Recommend she apply warm compresses to area QID for 20 minutes each.  Monitor area and if worsening erythema or warmth immediately notify provider.  Return in one week for follow-up.

## 2020-10-29 NOTE — Patient Instructions (Signed)

## 2020-11-05 ENCOUNTER — Other Ambulatory Visit: Payer: Self-pay

## 2020-11-05 ENCOUNTER — Ambulatory Visit (INDEPENDENT_AMBULATORY_CARE_PROVIDER_SITE_OTHER): Payer: Medicare HMO | Admitting: Nurse Practitioner

## 2020-11-05 ENCOUNTER — Encounter: Payer: Self-pay | Admitting: Nurse Practitioner

## 2020-11-05 VITALS — BP 137/70 | HR 60 | Temp 98.8°F | Wt 235.8 lb

## 2020-11-05 DIAGNOSIS — G8929 Other chronic pain: Secondary | ICD-10-CM

## 2020-11-05 DIAGNOSIS — L03116 Cellulitis of left lower limb: Secondary | ICD-10-CM | POA: Diagnosis not present

## 2020-11-05 DIAGNOSIS — M545 Low back pain, unspecified: Secondary | ICD-10-CM

## 2020-11-05 DIAGNOSIS — Z6841 Body Mass Index (BMI) 40.0 and over, adult: Secondary | ICD-10-CM | POA: Diagnosis not present

## 2020-11-05 MED ORDER — DOXYCYCLINE HYCLATE 100 MG PO TABS: 100.0000 mg | ORAL_TABLET | Freq: Two times a day (BID) | ORAL | 0 refills | Status: AC

## 2020-11-05 MED ORDER — GABAPENTIN 300 MG PO CAPS: ORAL_CAPSULE | ORAL | 4 refills | Status: DC

## 2020-11-05 MED ORDER — TIZANIDINE HCL 4 MG PO TABS: ORAL_TABLET | ORAL | 7 refills | Status: DC

## 2020-11-05 NOTE — Assessment & Plan Note (Signed)
Acute and improving at this time.  Will extend Doxycycline for 3 more days since is improving -- recommend continue ointment BID.  Return to office if any worsening of symptoms presents.

## 2020-11-05 NOTE — Patient Instructions (Signed)
[email or web address]://[email or web address]">  Managing Chronic Back Pain Chronic back pain is back pain that lasts for 12 weeks or longer. It often affects the lower back. Back pain may feel like a muscle ache or a sharp, stabbing pain. It can be mild, moderate, or severe. If you have been diagnosed with chronic back pain, there are things you can do to manage your symptoms. You may have to try different things to see what works best for you. Your health care provider may also give you specific instructions. How to manage lifestyle changes Treating chronic back pain often starts with rest and pain relief, followed by exercises to restore movement and strength to your back (physical therapy). You may need surgery if other treatments do not help, or if your pain is caused by a condition or an injury. Follow your treatment plan as told by your health care provider. This may include:  Relaxation techniques.  Talk therapy or counseling with a mental health specialist. A form of talk therapy called cognitive behavioral therapy (CBT) can be especially helpful. This therapy helps you set goals and follow up on the changes that you make.  Acupuncture or massage therapy.  Local electrical stimulation.  Injections. These deliver numbing or pain-relieving medicines into your spine or the area of pain. How to recognize changes in your chronic back pain Your condition may improve with treatment. However, back pain may not go away or may get worse over time. Watch your symptoms carefully and let your health care provider know if your symptoms get worse or do not improve. Your back pain may be getting worse if you have:  Pain that begins to cause problems with posture.  Pain that gets worse when you are sitting, standing, walking, bending, or lifting.  Pain that affects you while you are active, or at rest, or both.  Pain that eventually makes it hard to move around (limits mobility).  Pain that occurs with  fever, weight loss, or difficulty urinating.  Pain that causes numbness and tingling. How to use body mechanics and posture to help with pain Healthy body mechanics and good posture can help to relieve stress on your back. Body mechanics refers to the movements and positions of your body during your daily activities. Posture is part of body mechanics. Good posture means:  Your spine is in its natural S-curve, or neutral, position.  Your shoulders are pulled back slightly.  Your head is not tipped forward. Follow these guidelines to improve your posture and body mechanics in your everyday activities. Standing  When standing, keep your spine neutral and your feet about hip-width apart. Keep your knees slightly bent. Your ears, shoulders, and hips should line up.  When you do a task in which you stand in one place for a long time, place one foot on a stable object that is 2-4 inches (5-10 cm) high, such as a footstool. This helps keep your spine neutral.   Sitting  When sitting, keep your spine neutral and your feet flat on the floor. Use a footrest, if necessary, and keep your thighs parallel to the floor. Avoid rounding your shoulders, and avoid tilting your head forward.  When working at a desk or a computer, keep your desk at a height where your hands are slightly lower than your elbows. Slide your chair under your desk so you are close enough to maintain good posture.  When working at a computer, place your monitor at a height where you are looking straight ahead and  you do not have to tilt your head forward or downward to view the screen.   Lifting  Keep your feet at least shoulder-width apart and tighten the muscles of your abdomen.  Bend your knees and hips and keep your spine neutral. Be sure to lift using the strength of your legs, not your back. Do not lock your knees straight out.  Always ask for help to lift heavy or awkward objects.   Resting  When lying down and resting,  avoid positions that are most painful.  If you have pain with activities such as sitting, bending, stooping, or squatting, lie in a position in which your body does not bend very much. For example, avoid curling up on your side with your arms and knees near your chest (fetal position).  If you have pain with activities such as standing for a long time or reaching with your arms, lie with your spine in a neutral position and bend your knees slightly. Try: ? Lying on your side with a pillow between your knees. ? Lying on your back with a pillow under your knees.   Follow these instructions at home: Medicines  Treatment may include over-the-counter or prescription medicines for pain and inflammation that are taken by mouth or applied to the skin. Another treatment may include muscle relaxants. Take over-the-counter and prescription medicines only as told by your health care provider.  Ask your health care provider if the medicine prescribed to you: ? Requires you to avoid driving or using machinery. ? Can cause constipation. You may need to take these actions to prevent or treat constipation:  Drink enough fluid to keep your urine pale yellow.  Take over-the-counter or prescription medicines.  Eat foods that are high in fiber, such as beans, whole grains, and fresh fruits and vegetables.  Limit foods that are high in fat and processed sugars, such as fried or sweet foods. Lifestyle  Do not use any products that contain nicotine or tobacco, such as cigarettes, e-cigarettes, and chewing tobacco. If you need help quitting, ask your health care provider.  Eat a healthy diet that includes foods such as vegetables, fruits, fish, and lean meats.  Work with your health care provider to achieve or maintain a healthy weight. General instructions  Get regular exercise as told. Exercise improves flexibility and strength.  If physical therapy was prescribed, do exercises as told by your health care  provider.  Use ice or heat therapy as told by your health care provider.  Keep all follow-up visits as told by your health care provider. This is important. Where can I get support? Consider joining a support group for people managing chronic back pain. Ask your health care provider about support groups in your area. You can also find online and in-person support groups through:  The American Chronic Pain Association: theacpa.org  Pain Connection Program: painconnection.org Contact a health care provider if:  You have pain that is not relieved with rest or medicine.  Your pain gets worse, or you have new pain.  You have a fever.  You have rapid weight loss.  You have trouble doing your normal activities. Get help right away if:  You have weakness or numbness in one or both of your legs or feet.  You have trouble controlling your bladder or your bowels.  You have severe back pain and have any of the following: ? Nausea or vomiting. ? Abdominal pain. ? Shortness of breath or you faint. Summary  Chronic back  pain is often treated with rest, pain relief, and physical therapy.  Talk therapy, acupuncture, massage, and local electrical stimulation may help.  Follow your treatment plan as told by your health care provider.  Joining a support group may help you manage chronic back pain. This information is not intended to replace advice given to you by your health care provider. Make sure you discuss any questions you have with your health care provider. Document Revised: 07/05/2019 Document Reviewed: 03/13/2019 Elsevier Patient Education  [place].

## 2020-11-05 NOTE — Assessment & Plan Note (Signed)
Chronic, ongoing with worsening discomfort reported.  At this time switch Gabapentin to 300 MG QHS only due to drowsiness with this.  Continue Tizanidine as needed + Tylenol.  Recommend use of TENS and heat.  Has tried PT in past without benefit.  Due to past MRI findings and worsening pain discussed with her she may benefit from neurosurgery assessment and further recommendations.  She would like to pursue this.  Referral placed.  Return as scheduled or if worsening pain.

## 2020-11-05 NOTE — Progress Notes (Signed)
BP 137/70 (BP Location: Left Arm, Cuff Size: Normal)   Pulse 60   Temp 98.8 F (37.1 C) (Oral)   Wt 235 lb 12.8 oz (107 kg)   SpO2 95%   BMI 38.06 kg/m    Subjective:    Patient ID: Erika Anderson, female    DOB: May 30, 1951, 70 y.o.   MRN: [id number]  HPI: Erika Anderson is a 71 y.o. female  Chief Complaint  Patient presents with  . Cellulitis    1 week f/up- states she has one dose of abx left  . Joint Pain    Pt states she has been having joint pains, mainly in her lower back and knees. States she was one meloxicam in the past that helped    SKIN INFECTION Follow-up for cellulitis treated with Doxycycline and Mupirocin on 10/29/20.  Initially presented to  back of left foot at [place] area and x 1 to lateral left heel.  First noticed it over last weekend, had been out working in yard.  Does not recall bug biting her.  Is improving with less edema and erythema, reports area of "bites" are scabbing. Duration: days Location: left [place] area History of trauma in area: no Pain: none Quality: none Severity: mild Redness: yes Swelling: none Oozing: no Pus: no Fevers: no Nausea/vomiting: no Status: stable Treatments attempted:none  Tetanus: UTD   BACK PAIN Reports having joint pains, in past has used for Meloxicam.  Pain to lower back mainly.  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  She feels the lower back pain is getting worse.  This pain started in August 2020 -- was painting when laying on back and that is when pain started.  Takes Gabapentin, works, but makes groggy if takes 3 a day.  Tizanidine helps some.  Has done physical therapy without much benefit. Duration: chronic Pain: yes Symmetric: yes  10/10  In evening, at baseline 5/10 Quality: dull and aching Frequency: constant Radiation: R leg below the knee Aggravating factors: lifting, movement and bending Alleviating factors:  Meloxicam , Gabapentin, Tizanidine Status: fluctuating Treatments attempted: Meloxicam, heat and APAP , Gabapentin, Tizanidine Relief with NSAIDs?: moderate with Meloxicam Nighttime pain:  no Paresthesias / decreased sensation:  no Bowel / bladder incontinence:  no Fevers:  no Dysuria / urinary frequency:  no  Relevant past medical, surgical, family and social history reviewed and updated as indicated. Interim medical history since our last visit reviewed. Allergies and medications reviewed and updated.  Review of Systems  Constitutional: Negative.   Respiratory: Negative.   Cardiovascular: Negative.   Skin: Positive for wound.  Neurological: Negative.   Psychiatric/Behavioral: Negative.     Per HPI unless specifically indicated above     Objective:    BP 137/70 (BP Location: Left Arm, Cuff Size: Normal)   Pulse 60   Temp 98.8 F (37.1 C) (Oral)   Wt 235 lb 12.8 oz (107 kg)   SpO2 95%   BMI 38.06 kg/m   Wt Readings from Last 3 Encounters:  11/05/20 235 lb 12.8 oz (107 kg)  10/29/20 236 lb 9.6 oz (107.3 kg)  07/28/20 244 lb 6.4 oz (110.9 kg)    Physical Exam Vitals and nursing note reviewed.  Constitutional:      General: She is awake. She is not in acute distress.    Appearance: She is well-developed and well-groomed. She is obese. She is not ill-appearing or toxic-appearing.  HENT:     Head: Normocephalic.     Right Ear: Hearing normal.     Left Ear: Hearing normal.  Eyes:     General: Lids are normal.        Right eye: No discharge.        Left eye: No discharge.     Conjunctiva/sclera: Conjunctivae normal.     Pupils: Pupils are equal, round, and reactive to light.  Cardiovascular:     Rate and Rhythm: Normal rate and regular rhythm.     Heart sounds: Normal heart sounds. No murmur heard. No gallop.   Pulmonary:     Effort: Pulmonary effort is normal. No accessory muscle usage or respiratory distress.     Breath sounds: Normal breath sounds.   Abdominal:     General: Bowel sounds are normal.     Palpations: Abdomen is soft.  Musculoskeletal:     Cervical back: Normal range of motion and neck supple.     Lumbar back: Laceration and tenderness (to right SI area) present. No swelling, edema, signs of trauma or spasms. Normal range of motion. Positive right straight leg raise test. Negative left straight leg raise test.     Right lower leg: No edema.     Left lower leg: No edema.     Comments: Discomfort with extension, none with flexion.  Mild discomfort with rotation and lateral bend right side.    Skin:    General: Skin is warm and dry.     Findings: Wound present.          Comments: One small pea-sized intact area of crusting to lateral left foot.  No warmth or drainage.  Varicose veins present.  Neurological:     Mental Status: She is alert and oriented to person, place, and time.  Psychiatric:        Attention and Perception: Attention normal.        Mood and Affect: Mood normal.        Speech: Speech normal.        Behavior: Behavior normal. Behavior is cooperative.        Thought Content: Thought content normal.    Results for orders placed or performed in visit on 08/29/20  Lipid Panel w/o Chol/HDL Ratio  Result Value Ref Range   Cholesterol, Total 209 (H) 100 - 199 mg/dL   Triglycerides 158 (H) 0 - 149 mg/dL   HDL 45 >39 mg/dL   VLDL Cholesterol Cal 28 5 - 40 mg/dL   LDL Chol Calc (NIH) 136 (H) 0 - 99 mg/dL  Comp Met (CMET)  Result Value Ref Range   Glucose 94 65 - 99 mg/dL   BUN 21 8 - 27 mg/dL   Creatinine, Ser 0.82 0.57 - 1.00 mg/dL   eGFR 78 >59 mL/min/1.73   BUN/Creatinine Ratio 26 12 - 28   Sodium 140 134 - 144 mmol/L   Potassium 4.9 3.5 - 5.2 mmol/L   Chloride 101 96 - 106 mmol/L   CO2 24 20 - 29 mmol/L   Calcium 10.3 8.7 - 10.3 mg/dL   Total Protein 7.1 6.0 - 8.5 g/dL   Albumin 4.4 3.8 - 4.8 g/dL   Globulin, Total 2.7 1.5 - 4.5 g/dL   Albumin/Globulin Ratio 1.6 1.2 - 2.2   Bilirubin Total  0.4 0.0 - 1.2 mg/dL   Alkaline Phosphatase 72 44 - 121 IU/L   AST 21 0 - 40 IU/L   ALT 17 0 - 32 IU/L  Assessment & Plan:   Problem List Items Addressed This Visit      Other   BMI 40.0-44.9, adult ([place]) - Primary    Ongoing with almost 20 pounds loss since February, praised for this.  Recommended eating smaller high protein, low fat meals more frequently and exercising 30 mins a day 5 times a week with a goal of 10-15lb weight loss in the next 3 months. Patient voiced their understanding and motivation to adhere to these recommendations.       Chronic bilateral low back pain without sciatica    Chronic, ongoing with worsening discomfort reported.  At this time switch Gabapentin to 300 MG QHS only due to drowsiness with this.  Continue Tizanidine as needed + Tylenol.  Recommend use of TENS and heat.  Has tried PT in past without benefit.  Due to past MRI findings and worsening pain discussed with her she may benefit from neurosurgery assessment and further recommendations.  She would like to pursue this.  Referral placed.  Return as scheduled or if worsening pain.      Relevant Medications   tiZANidine (ZANAFLEX) 4 MG tablet   Other Relevant Orders   Ambulatory referral to Neurosurgery   Cellulitis    Acute and improving at this time.  Will extend Doxycycline for 3 more days since is improving -- recommend continue ointment BID.  Return to office if any worsening of symptoms presents.          Follow up plan: Return for as scheduled.

## 2020-11-05 NOTE — Assessment & Plan Note (Signed)
Ongoing with almost 20 pounds loss since February, praised for this.  Recommended eating smaller high protein, low fat meals more frequently and exercising 30 mins a day 5 times a week with a goal of 10-15lb weight loss in the next 3 months. Patient voiced their understanding and motivation to adhere to these recommendations.

## 2020-11-25 DIAGNOSIS — M5136 Other intervertebral disc degeneration, lumbar region: Secondary | ICD-10-CM | POA: Diagnosis not present

## 2020-11-25 DIAGNOSIS — M5416 Radiculopathy, lumbar region: Secondary | ICD-10-CM | POA: Diagnosis not present

## 2020-11-28 DIAGNOSIS — G8929 Other chronic pain: Secondary | ICD-10-CM | POA: Diagnosis not present

## 2020-11-28 DIAGNOSIS — G2581 Restless legs syndrome: Secondary | ICD-10-CM | POA: Diagnosis not present

## 2020-11-28 DIAGNOSIS — R030 Elevated blood-pressure reading, without diagnosis of hypertension: Secondary | ICD-10-CM | POA: Diagnosis not present

## 2020-11-28 DIAGNOSIS — R69 Illness, unspecified: Secondary | ICD-10-CM | POA: Diagnosis not present

## 2020-11-28 DIAGNOSIS — M1990 Unspecified osteoarthritis, unspecified site: Secondary | ICD-10-CM | POA: Diagnosis not present

## 2020-11-28 DIAGNOSIS — G629 Polyneuropathy, unspecified: Secondary | ICD-10-CM | POA: Diagnosis not present

## 2020-11-28 DIAGNOSIS — H269 Unspecified cataract: Secondary | ICD-10-CM | POA: Diagnosis not present

## 2020-11-28 DIAGNOSIS — G4700 Insomnia, unspecified: Secondary | ICD-10-CM | POA: Diagnosis not present

## 2020-11-28 DIAGNOSIS — I499 Cardiac arrhythmia, unspecified: Secondary | ICD-10-CM | POA: Diagnosis not present

## 2020-12-03 ENCOUNTER — Other Ambulatory Visit: Payer: Self-pay | Admitting: Nurse Practitioner

## 2020-12-03 DIAGNOSIS — Z1231 Encounter for screening mammogram for malignant neoplasm of breast: Secondary | ICD-10-CM

## 2020-12-04 DIAGNOSIS — M5416 Radiculopathy, lumbar region: Secondary | ICD-10-CM | POA: Diagnosis not present

## 2020-12-04 DIAGNOSIS — M5126 Other intervertebral disc displacement, lumbar region: Secondary | ICD-10-CM | POA: Diagnosis not present

## 2020-12-26 DIAGNOSIS — M5136 Other intervertebral disc degeneration, lumbar region: Secondary | ICD-10-CM | POA: Diagnosis not present

## 2020-12-26 DIAGNOSIS — M47816 Spondylosis without myelopathy or radiculopathy, lumbar region: Secondary | ICD-10-CM | POA: Diagnosis not present

## 2020-12-26 DIAGNOSIS — M5416 Radiculopathy, lumbar region: Secondary | ICD-10-CM | POA: Diagnosis not present

## 2021-01-08 ENCOUNTER — Ambulatory Visit
Admission: RE | Admit: 2021-01-08 | Discharge: 2021-01-08 | Disposition: A | Discharge status: Home / Self Care | Payer: Medicare HMO | Source: Ambulatory Visit | Attending: Nurse Practitioner | Admitting: Nurse Practitioner

## 2021-01-08 ENCOUNTER — Other Ambulatory Visit: Payer: Self-pay

## 2021-01-08 DIAGNOSIS — Z1231 Encounter for screening mammogram for malignant neoplasm of breast: Secondary | ICD-10-CM | POA: Insufficient documentation

## 2021-01-08 IMAGING — MG MM DIGITAL SCREENING BILAT W/ TOMO AND CAD
8 series · 8 of 24 positions shown · non-contrast
Comparison: Previous exam(s).

CLINICAL DATA: Screening.

EXAM:
DIGITAL SCREENING BILATERAL MAMMOGRAM WITH TOMOSYNTHESIS AND CAD
TECHNIQUE: Bilateral screening digital craniocaudal and mediolateral oblique
mammograms were obtained. Bilateral screening digital breast
tomosynthesis was performed. The images were evaluated with
computer-aided detection.

[L CC synth-2D]
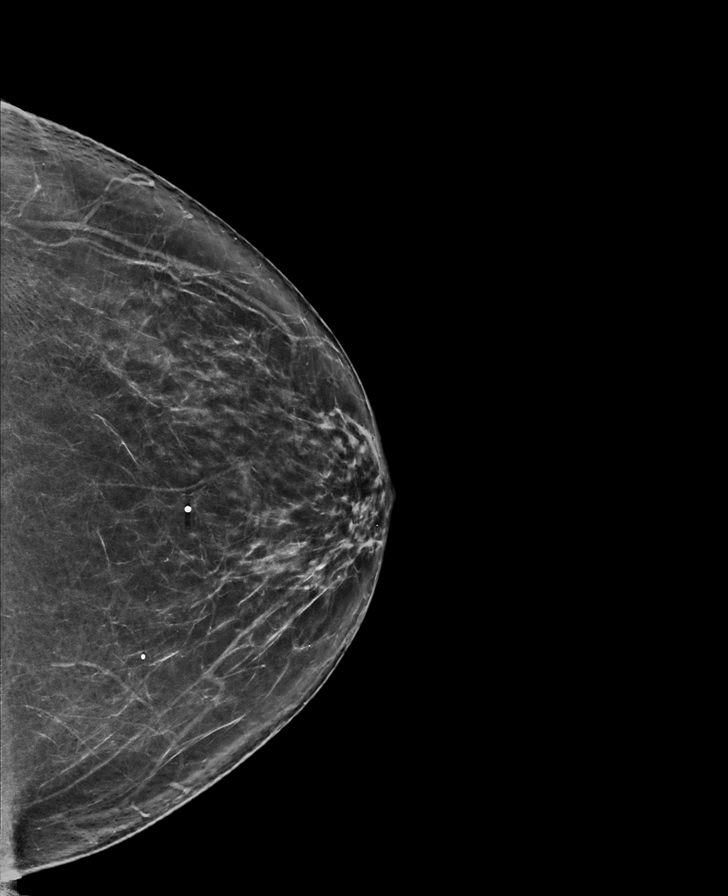

[L MLO synth-2D]
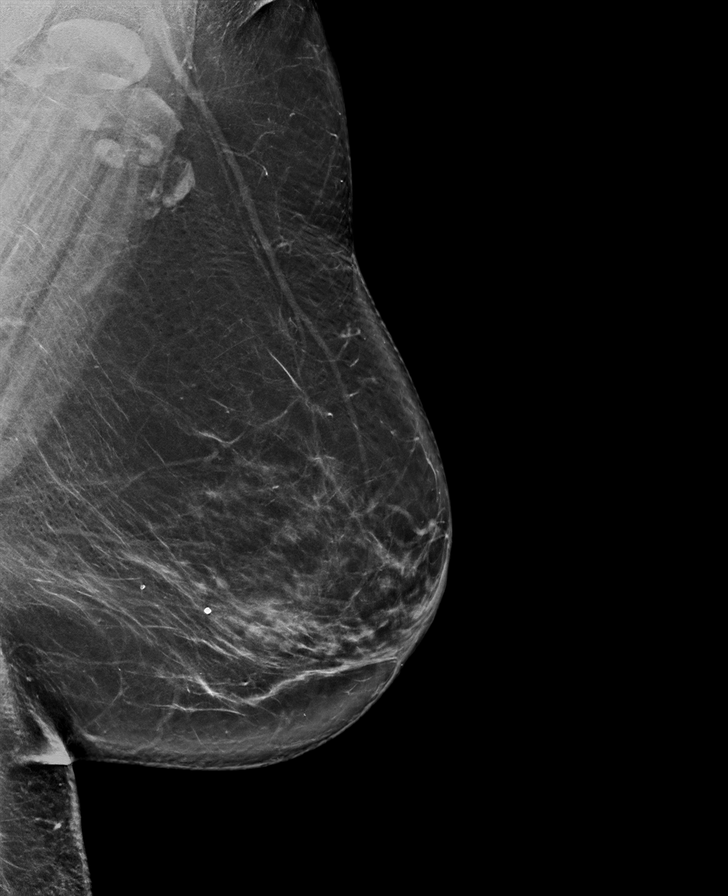

[R CC synth-2D]
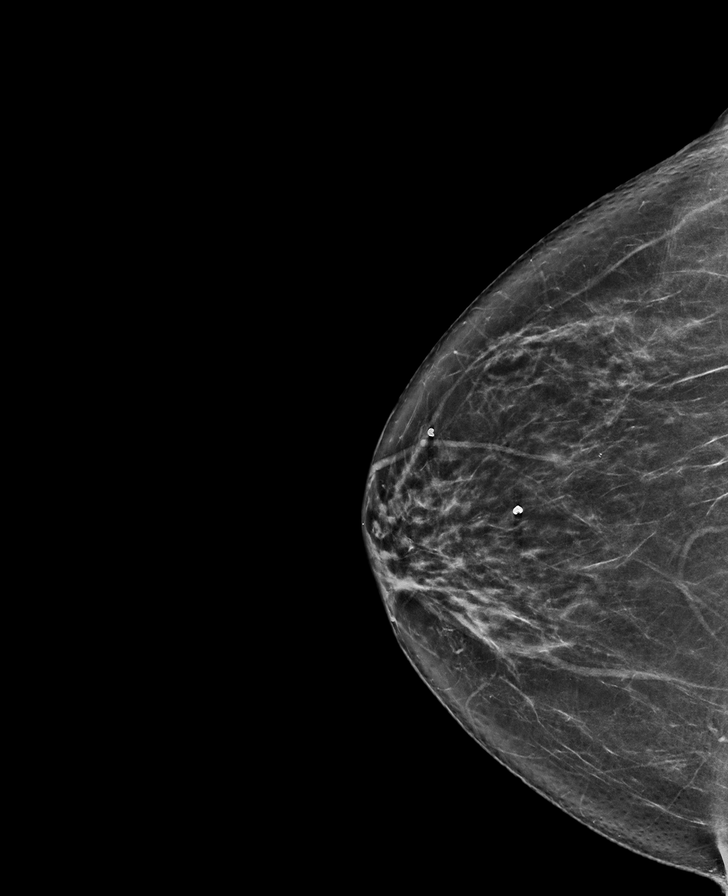

[R MLO synth-2D]
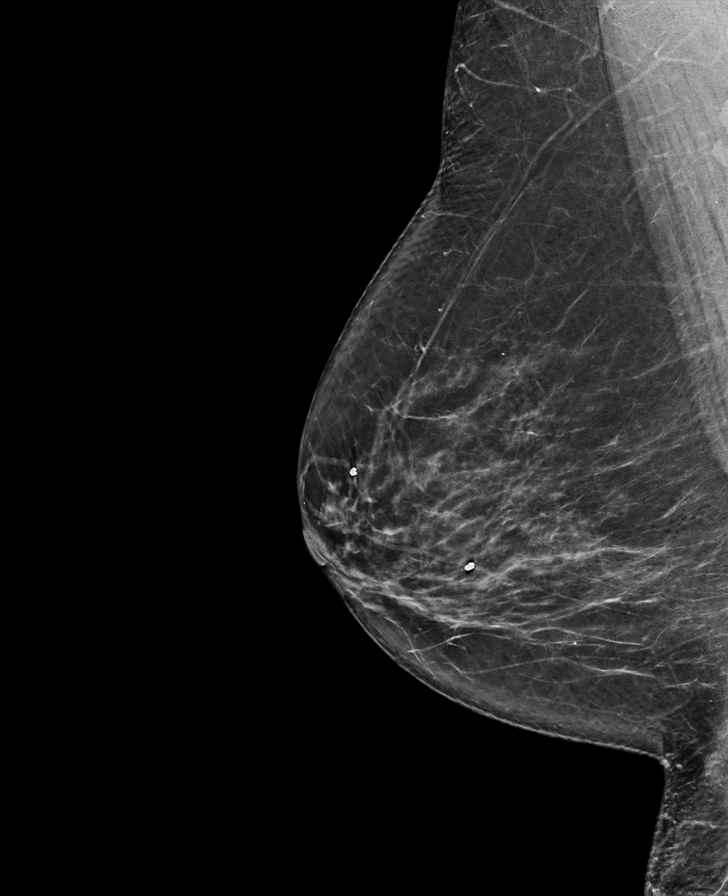

[L MLO tomo · tomo slice 47/94.0]
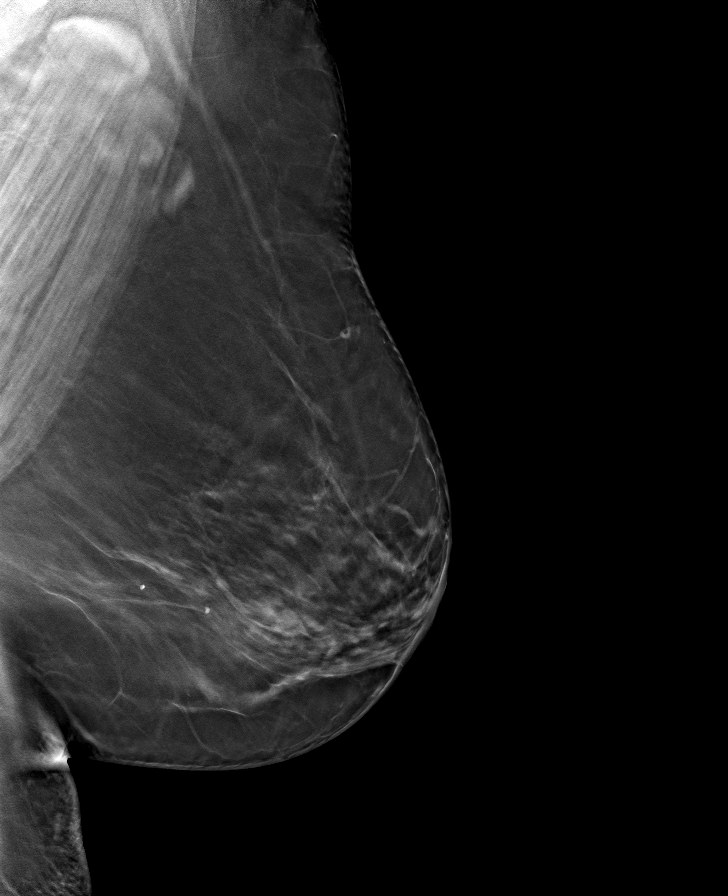

[R MLO tomo · tomo slice 42/83.0]
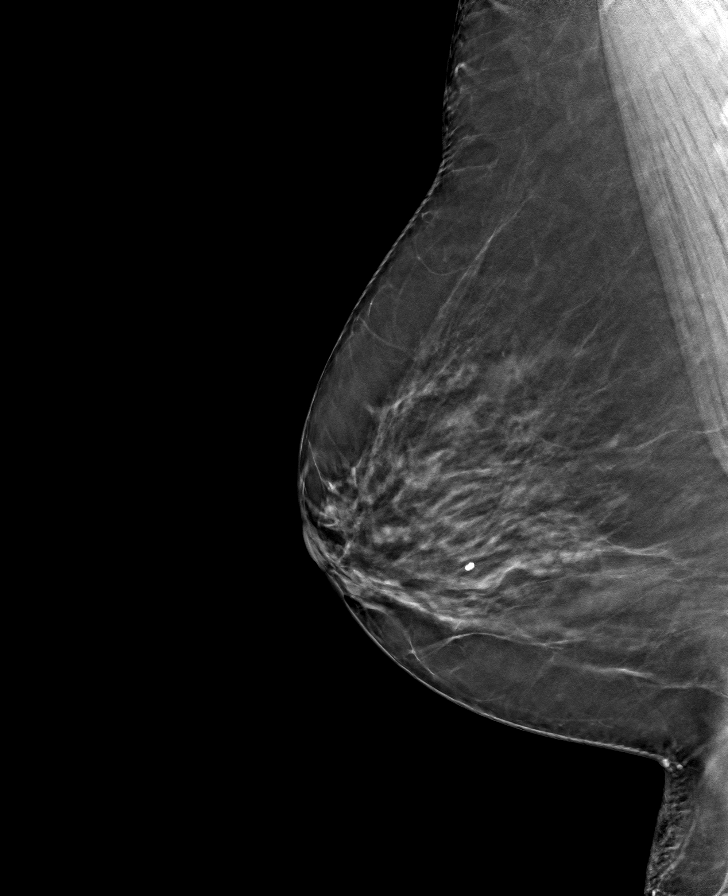

[R CC tomo · tomo slice 39/76.0]
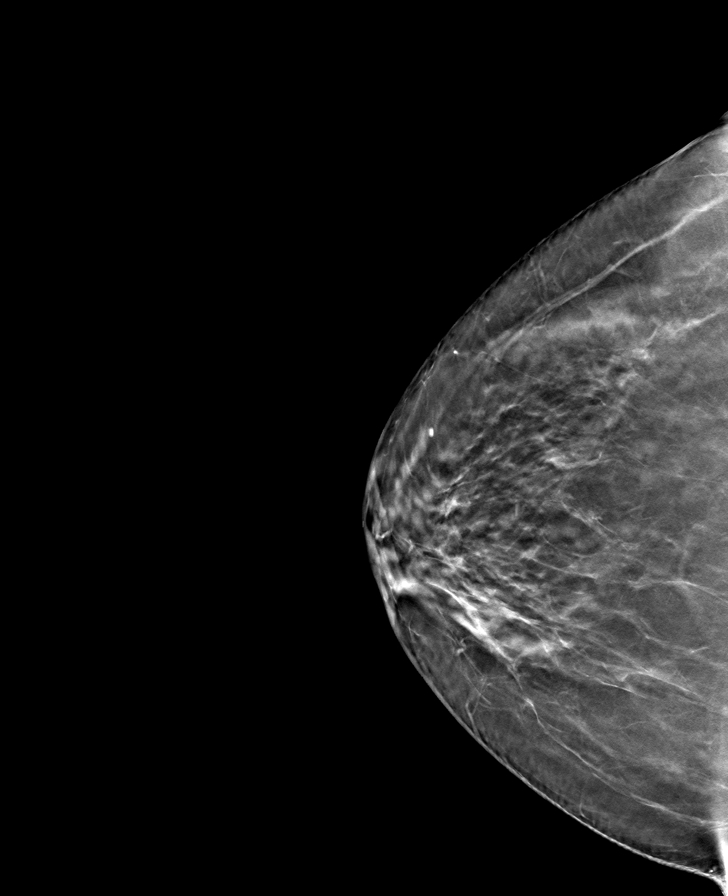

[L CC tomo · tomo slice 41/82.0]
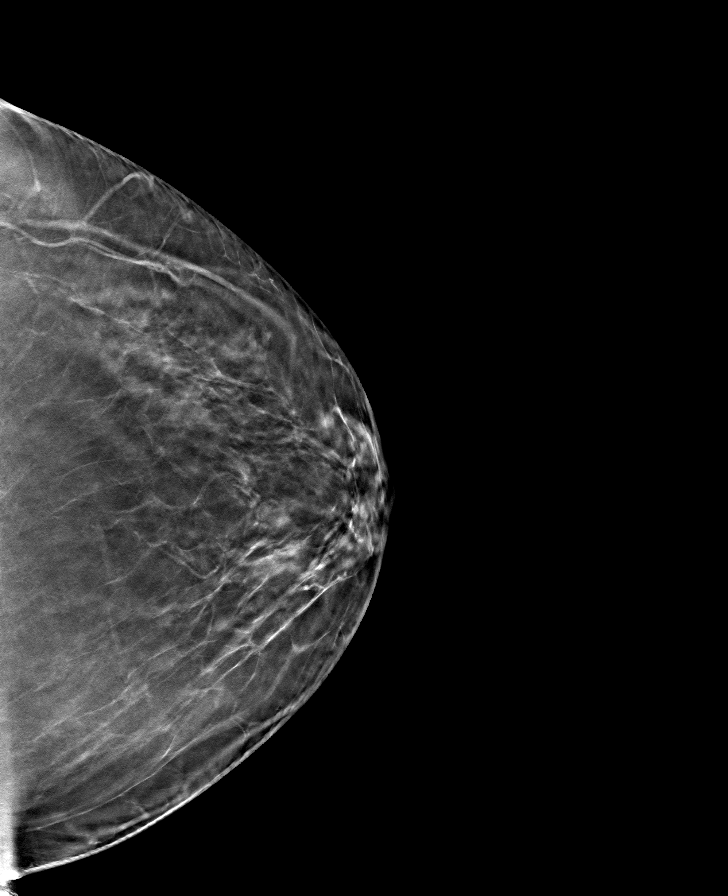

[8 of 24 positions shown; findings below may reference images not displayed]

ACR Breast Density Category c: The breast tissue is heterogeneously
dense, which may obscure small masses.
FINDINGS: There are no findings suspicious for malignancy.
IMPRESSION: No mammographic evidence of malignancy. A result letter of this
screening mammogram will be mailed directly to the patient.

RECOMMENDATION:
Screening mammogram in one year. (Code:[V2])

BI-RADS CATEGORY  1: Negative.

## 2021-01-16 DIAGNOSIS — M47816 Spondylosis without myelopathy or radiculopathy, lumbar region: Secondary | ICD-10-CM | POA: Diagnosis not present

## 2021-01-26 ENCOUNTER — Other Ambulatory Visit: Payer: Self-pay

## 2021-01-26 ENCOUNTER — Ambulatory Visit (INDEPENDENT_AMBULATORY_CARE_PROVIDER_SITE_OTHER): Payer: Medicare HMO | Admitting: Nurse Practitioner

## 2021-01-26 ENCOUNTER — Encounter: Payer: Self-pay | Admitting: Nurse Practitioner

## 2021-01-26 DIAGNOSIS — G8929 Other chronic pain: Secondary | ICD-10-CM | POA: Diagnosis not present

## 2021-01-26 DIAGNOSIS — M545 Low back pain, unspecified: Secondary | ICD-10-CM | POA: Diagnosis not present

## 2021-01-26 MED ORDER — MELOXICAM 7.5 MG PO TABS
7.5000 mg | ORAL_TABLET | Freq: Every day | ORAL | 0 refills | Status: DC
Start: 2021-01-26 — End: 2021-07-30

## 2021-01-26 NOTE — Patient Instructions (Signed)
Chronic Back Pain When back pain lasts longer than 3 months, it is called chronic back pain. Pain may get worse at certain times (flare-ups). There are things you can do at home to manage your pain. Follow these instructions at home: Pay attention to any changes in your symptoms. Take these actions to help with your pain: Managing pain and stiffness   If told, put ice on the painful area. Your doctor may tell you to use ice for 24-48 hours after the flare-up starts. To do this: Put ice in a plastic bag. Place a towel between your skin and the bag. Leave the ice on for 20 minutes, 2-3 times a day. If told, put heat on the painful area. Do this as often as told by your doctor. Use the heat source that your doctor recommends, such as a moist heat pack or a heating pad. Place a towel between your skin and the heat source. Leave the heat on for 20-30 minutes. Take off the heat if your skin turns bright red. This is especially important if you are unable to feel pain, heat, or cold. You may have a greater risk of getting burned. Soak in a warm bath. This can help relieve pain. Activity  Avoid bending and other activities that make pain worse. When standing: Keep your upper back and neck straight. Keep your shoulders pulled back. Avoid slouching. When sitting: Keep your back straight. Relax your shoulders. Do not round your shoulders or pull them backward. Do not sit or stand in one place for long periods of time. Take short rest breaks during the day. Lying down or standing is usually better than sitting. Resting can help relieve pain. When sitting or lying down for a long time, do some mild activity or stretching. This will help to prevent stiffness and pain. Get regular exercise. Ask your doctor what activities are safe for you. Do not lift anything that is heavier than 10 lb (4.5 kg) or the limit that you are told, until your doctor says that it is safe. To prevent injury when you lift  things: Bend your knees. Keep the weight close to your body. Avoid twisting. Sleep on a firm mattress. Try lying on your side with your knees slightly bent. If you lie on your back, put a pillow under your knees. Medicines Treatment may include medicines for pain and swelling taken by mouth or put on the skin, prescription pain medicine, or muscle relaxants. Take over-the-counter and prescription medicines only as told by your doctor. Ask your doctor if the medicine prescribed to you: Requires you to avoid driving or using machinery. Can cause trouble pooping (constipation). You may need to take these actions to prevent or treat trouble pooping: Drink enough fluid to keep your pee (urine) pale yellow. Take over-the-counter or prescription medicines. Eat foods that are high in fiber. These include beans, whole grains, and fresh fruits and vegetables. Limit foods that are high in fat and sugars. These include fried or sweet foods. General instructions Do not use any products that contain nicotine or tobacco, such as cigarettes, e-cigarettes, and chewing tobacco. If you need help quitting, ask your doctor. Keep all follow-up visits as told by your doctor. This is important. Contact a doctor if: Your pain does not get better with rest or medicine. Your pain gets worse, or you have new pain. You have a high fever. You lose weight very quickly. You have trouble doing your normal activities. Get help right away if: One   or both of your legs or feet feel weak. One or both of your legs or feet lose feeling (have numbness). You have trouble controlling when you poop (have a bowel movement) or pee (urinate). You have bad back pain and: You feel like you may vomit (nauseous), or you vomit. You have pain in your belly (abdomen). You have shortness of breath. You faint. Summary When back pain lasts longer than 3 months, it is called chronic back pain. Pain may get worse at certain times  (flare-ups). Use ice and heat as told by your doctor. Your doctor may tell you to use ice after flare-ups. This information is not intended to replace advice given to you by your health care provider. Make sure you discuss any questions you have with your health care provider. Document Revised: 07/04/2019 Document Reviewed: 07/04/2019 Elsevier Patient Education  2022 [employer]  

## 2021-01-26 NOTE — Progress Notes (Signed)
BP 136/74 (BP Location: Left Arm)   Pulse 67   Temp 98.1 F (36.7 C) (Oral)   Wt 240 lb 9.6 oz (109.1 kg)   SpO2 98%   BMI 38.83 kg/m    Subjective:    Patient ID: Erika Anderson, female    DOB: [birth year], 70 y.o.   MRN: [id number]  HPI: Erika Anderson is a 70 y.o. female  Chief Complaint  Patient presents with   Back Pain    Patient is here for a follow up on back pain. Patient states the back pain isn't getting any better, but states she is having another numbing procedure before they perform laser procedure on her back pain.    BACK PAIN Follow-up for back pain.  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  Currently seeing neurosurgery -- last seen 12/26/20, has had an injection, but this lasted 3 hours only.  Sees them again on the 29th for next steps -- epidural injection. This pain started in August 2020 -- was painting when laying on back and that is when pain started.  Takes Gabapentin, works, but makes groggy if takes 3 a day.  Tizanidine helps some.  Has done physical therapy without much benefit in past. Duration:  chronic Pain: yes Symmetric: yes  Scale: 7/10 at worst, 3-4/10 at best Quality: dull and aching Frequency: constant Radiation: R leg below the knee at times Aggravating factors: lifting, movement and bending Alleviating factors:  Meloxicam  , Gabapentin, Tizanidine Status: fluctuating Treatments attempted:  Meloxicam, heat and APAP , Gabapentin, Tizanidine Relief with NSAIDs?:  moderate with Meloxicam Nighttime pain:  no Paresthesias / decreased sensation:  no Bowel / bladder incontinence:  no Fevers:  no Dysuria / urinary frequency:  no  Relevant past medical, surgical, family and social history reviewed and updated as indicated. Interim medical history since our last visit reviewed. Allergies and medications reviewed and updated.  Review of Systems  Constitutional:  Negative.   Respiratory: Negative.    Cardiovascular: Negative.   Musculoskeletal:  Positive for back pain.  Neurological: Negative.   Psychiatric/Behavioral: Negative.     Per HPI unless specifically indicated above     Objective:    BP 136/74 (BP Location: Left Arm)   Pulse 67   Temp 98.1 F (36.7 C) (Oral)   Wt 240 lb 9.6 oz (109.1 kg)   SpO2 98%   BMI 38.83 kg/m   Wt Readings from Last 3 Encounters:  01/26/21 240 lb 9.6 oz (109.1 kg)  11/05/20 235 lb 12.8 oz (107 kg)  10/29/20 236 lb 9.6 oz (107.3 kg)    Physical Exam Vitals and nursing note reviewed.  Constitutional:      General: She is awake. She is not in acute distress.    Appearance: She is well-developed and well-groomed. She is obese. She is not ill-appearing or toxic-appearing.  HENT:     Head: Normocephalic.     Right Ear: Hearing normal.     Left Ear: Hearing normal.  Eyes:     General: Lids are normal.        Right eye: No discharge.        Left eye: No discharge.     Conjunctiva/sclera: Conjunctivae normal.     Pupils: Pupils are equal, round, and reactive to light.  Cardiovascular:     Rate and Rhythm: Normal rate and regular rhythm.     Heart sounds: Normal  heart sounds. No murmur heard.   No gallop.  Pulmonary:     Effort: Pulmonary effort is normal. No accessory muscle usage or respiratory distress.     Breath sounds: Normal breath sounds.  Abdominal:     General: Bowel sounds are normal.     Palpations: Abdomen is soft.  Musculoskeletal:     Cervical back: Normal range of motion and neck supple.     Lumbar back: Tenderness (to right SI area) present. No swelling, edema, signs of trauma, lacerations or spasms. Normal range of motion. Positive right straight leg raise test. Negative left straight leg raise test.     Right lower leg: No edema.     Left lower leg: No edema.     Comments: Discomfort with extension, none with flexion.  Mild discomfort with rotation and lateral bend right side.     Skin:    General: Skin is warm and dry.  Neurological:     Mental Status: She is alert and oriented to person, place, and time.  Psychiatric:        Attention and Perception: Attention normal.        Mood and Affect: Mood normal.        Speech: Speech normal.        Behavior: Behavior normal. Behavior is cooperative.        Thought Content: Thought content normal.   Results for orders placed or performed in visit on 08/29/20  Lipid Panel w/o Chol/HDL Ratio  Result Value Ref Range   Cholesterol, Total 209 (H) 100 - 199 mg/dL   Triglycerides 158 (H) 0 - 149 mg/dL   HDL 45 >39 mg/dL   VLDL Cholesterol Cal 28 5 - 40 mg/dL   LDL Chol Calc (NIH) 136 (H) 0 - 99 mg/dL  Comp Met (CMET)  Result Value Ref Range   Glucose 94 65 - 99 mg/dL   BUN 21 8 - 27 mg/dL   Creatinine, Ser 0.82 0.57 - 1.00 mg/dL   eGFR 78 >59 mL/min/1.73   BUN/Creatinine Ratio 26 12 - 28   Sodium 140 134 - 144 mmol/L   Potassium 4.9 3.5 - 5.2 mmol/L   Chloride 101 96 - 106 mmol/L   CO2 24 20 - 29 mmol/L   Calcium 10.3 8.7 - 10.3 mg/dL   Total Protein 7.1 6.0 - 8.5 g/dL   Albumin 4.4 3.8 - 4.8 g/dL   Globulin, Total 2.7 1.5 - 4.5 g/dL   Albumin/Globulin Ratio 1.6 1.2 - 2.2   Bilirubin Total 0.4 0.0 - 1.2 mg/dL   Alkaline Phosphatase 72 44 - 121 IU/L   AST 21 0 - 40 IU/L   ALT 17 0 - 32 IU/L      Assessment & Plan:   Problem List Items Addressed This Visit       Other   Chronic bilateral low back pain without sciatica    Chronic, ongoing.  At this time continue collaboration with neurosurgery.  Continue Gabapentin 300 MG QHS only due to drowsiness with this.  Continue Tizanidine as needed + Tylenol & Meloxicam.  Recommend use of TENS and heat.  Return in 6 months for physical, sooner if worsening symptoms.      Relevant Medications   meloxicam (MOBIC) 7.5 MG tablet     Follow up plan: Return in about 6 months (around 07/29/2021) for Annual physical.

## 2021-01-26 NOTE — Assessment & Plan Note (Signed)
Chronic, ongoing.  At this time continue collaboration with neurosurgery.  Continue Gabapentin 300 MG QHS only due to drowsiness with this.  Continue Tizanidine as needed + Tylenol & Meloxicam.  Recommend use of TENS and heat.  Return in 6 months for physical, sooner if worsening symptoms.

## 2021-02-02 DIAGNOSIS — M47816 Spondylosis without myelopathy or radiculopathy, lumbar region: Secondary | ICD-10-CM | POA: Diagnosis not present

## 2021-02-03 ENCOUNTER — Other Ambulatory Visit: Payer: Self-pay | Admitting: Nurse Practitioner

## 2021-02-03 NOTE — Telephone Encounter (Signed)
Requested Prescriptions  Pending Prescriptions Disp Refills  . citalopram (CELEXA) 20 MG tablet [Pharmacy Med Name: Citalopram Hydrobromide 20 MG Oral Tablet] 90 tablet 0    Sig: Take 1 tablet by mouth once daily     Psychiatry:  Antidepressants - SSRI Passed - 02/03/2021  9:15 AM      Passed - Valid encounter within last 6 months    Recent Outpatient Visits          1 week ago Chronic bilateral low back pain without sciatica   [facility] [place], Jolene T, NP   3 months ago BMI 40.0-44.9, adult ([place])   [place], Jolene T, NP   3 months ago Cellulitis of left lower extremity   [place], Jolene T, NP   5 months ago Aortic atherosclerosis ([place])   [place], Lauren A, NP   6 months ago Aortic atherosclerosis ([place])   [place], Barbaraann Faster, NP      Future Appointments            In 2 weeks  [place], [place]   In 5 months [place], Barbaraann Faster, NP [employer]

## 2021-02-17 DIAGNOSIS — M47816 Spondylosis without myelopathy or radiculopathy, lumbar region: Secondary | ICD-10-CM | POA: Diagnosis not present

## 2021-02-23 ENCOUNTER — Ambulatory Visit: Payer: Medicare HMO

## 2021-02-23 ENCOUNTER — Telehealth: Payer: Self-pay

## 2021-02-23 NOTE — Telephone Encounter (Signed)
This nurse called patient in regards to scheduled telephonic AWV. Patient is currently getting grandson from school and unable to perform visit at this time. She would like [place] to call her at a later date to reschedule.

## 2021-02-27 ENCOUNTER — Ambulatory Visit (INDEPENDENT_AMBULATORY_CARE_PROVIDER_SITE_OTHER): Payer: Medicare HMO

## 2021-02-27 DIAGNOSIS — Z0000 Encounter for general adult medical examination without abnormal findings: Secondary | ICD-10-CM

## 2021-02-27 NOTE — Progress Notes (Signed)
Subjective:   Erika Anderson is a 70 y.o. female who presents for Medicare Annual (Subsequent) preventive examination.  I connected with  Erika Anderson on 02/27/21 by an audio only telemedicine application and verified that I am speaking with the correct person using two identifiers.   I discussed the limitations, risks, security and privacy concerns of performing an evaluation and management service by telephone and the availability of in person appointments. I also discussed with the patient that there may be a patient responsible charge related to this service. The patient expressed understanding and verbally consented to this telephonic visit.  Location of Patient: home Location of Provider: office  List any persons and their role that are participating in the visit with the patient.    Review of Systems    Defer to PCP.  Cardiac Risk Factors include: none     Objective:    Today's Vitals   02/27/21 1758  PainSc: 0-No pain   There is no height or weight on file to calculate BMI.  Advanced Directives 02/27/2021 02/22/2020 01/17/2019 05/03/2018 01/12/2018 01/11/2017  Does Patient Have a Medical Advance Directive? No No No No No No  Would patient like information on creating a medical advance directive? No - Patient declined - - No - Patient declined Yes (MAU/Ambulatory/Procedural Areas - Information given) Yes (ED - Information included in AVS)    Current Medications (verified) Outpatient Encounter Medications as of 02/27/2021  Medication Sig   aspirin EC 81 MG tablet Take 81 mg by mouth daily. Swallow whole.   citalopram (CELEXA) 20 MG tablet Take 1 tablet by mouth once daily   Fish Oil-Cholecalciferol (OMEGA-3 FISH OIL/VITAMIN D3 PO) Take by mouth.   gabapentin (NEURONTIN) 300 MG capsule Take 1 capsule by mouth every night.   meloxicam (MOBIC) 7.5 MG tablet Take 1 tablet (7.5 mg total) by mouth daily.   mupirocin ointment (BACTROBAN) 2 % Apply 1 application topically 2  (two) times daily.   rOPINIRole (REQUIP) 1 MG tablet Take 1 tablet (1 mg total) by mouth 4 (four) times daily.   tiZANidine (ZANAFLEX) 4 MG tablet TAKE 1 TABLET BY MOUTH EVERY 6 HOURS AS NEEDED FOR MUSCLE SPASM   traZODone (DESYREL) 50 MG tablet TAKE 1/2 TO 1 (ONE-HALF TO ONE) TABLET BY MOUTH AT BEDTIME AS NEEDED FOR SLEEP   No facility-administered encounter medications on file as of 02/27/2021.    Allergies (verified) Patient has no known allergies.   History: Past Medical History:  Diagnosis Date   Hyperlipidemia    Obesity (BMI 30-39.9)    Restless legs syndrome (RLS) 12/28/2014   RLS (restless legs syndrome)    Past Surgical History:  Procedure Laterality Date   HEMORROIDECTOMY     TOTAL ABDOMINAL HYSTERECTOMY     ovaries remain   Family History  Problem Relation Age of Onset   Cancer Mother        ovarian   Dementia Father    Arthritis Sister    Heart disease Brother        MI   Stroke Paternal Grandmother    Cancer Paternal Grandfather        lung   Breast cancer Neg Hx    Social History   Socioeconomic History   Marital status: Married    Spouse name: Not on file   Number of children: Not on file   Years of education: 12   Highest education level: High school graduate  Occupational History   Occupation: retired  Tobacco Use   Smoking status: Never   Smokeless tobacco: Never  Vaping Use   Vaping Use: Never used  Substance and Sexual Activity   Alcohol use: No    Alcohol/week: 0.0 standard drinks   Drug use: No   Sexual activity: Not Currently  Other Topics Concern   Not on file  Social History Narrative   Helps take care of grandchildren    Social Determinants of Health   Financial Resource Strain: Low Risk    Difficulty of Paying Living Expenses: Not hard at all  Food Insecurity: No Food Insecurity   Worried About Charity fundraiser in the Last Year: Never true   [place] in the Last Year: Never true  Transportation Needs: No  Transportation Needs   Lack of Transportation (Medical): No   Lack of Transportation (Non-Medical): No  Physical Activity: Sufficiently Active   Days of Exercise per Week: 7 days   Minutes of Exercise per Session: 30 min  Stress: No Stress Concern Present   Feeling of Stress : Not at all  Social Connections: Moderately Integrated   Frequency of Communication with Friends and Family: More than three times a week   Frequency of Social Gatherings with Friends and Family: More than three times a week   Attends Religious Services: More than 4 times per year   Active Member of [employer] or Organizations: No   Attends Music therapist: Never   Marital Status: Married    Tobacco Counseling Counseling given: Not Answered   Clinical Intake:  Pre-visit preparation completed: Yes  Pain : No/denies pain Pain Score: 0-No pain     Nutritional Risks: None Diabetes: No  How often do you need to have someone help you when you read instructions, pamphlets, or other written materials from your doctor or pharmacy?: 1 - Never What is the last grade level you completed in school?: 12th grade  Diabetic?No  Interpreter Needed?: No      Activities of Daily Living In your present state of health, do you have any difficulty performing the following activities: 02/27/2021  Hearing? N  Vision? N  Difficulty concentrating or making decisions? N  Walking or climbing stairs? N  Dressing or bathing? N  Doing errands, shopping? N  Preparing Food and eating ? N  Using the Toilet? N  In the past six months, have you accidently leaked urine? N  Do you have problems with loss of bowel control? N  Managing your Medications? N  Managing your Finances? N  Housekeeping or managing your Housekeeping? N  Some recent data might be hidden    Patient Care Team: Venita Lick, NP as PCP - General (Nurse Practitioner) Rico Junker, RN as Registered Nurse Theodore Demark, RN as  Registered Nurse  Indicate any recent Medical Services you may have received from other than [facility] providers in the past year (date may be approximate).     Assessment:   This is a routine wellness examination for Erika Anderson.  Hearing/Vision screen No results found.  Dietary issues and exercise activities discussed: Current Exercise Habits: Home exercise routine, Type of exercise: walking, Time (Minutes): 30, Frequency (Times/Week): 7, Weekly Exercise (Minutes/Week): 210, Intensity: Mild, Exercise limited by: orthopedic condition(s)   Goals Addressed   None   Depression Screen PHQ 2/9 Scores 02/27/2021 07/28/2020 02/22/2020 01/24/2020 01/19/2019 01/17/2019 01/12/2018  PHQ - 2 Score 0 0 0 0 0 0 0  PHQ- 9 Score - 0 - -  0 - -    Fall Risk Fall Risk  02/27/2021 02/22/2020 01/24/2020 09/10/2019 05/10/2018  Falls in the past year? 0 0 0 0 0  Number falls in past yr: 0 - 0 0 -  Injury with Fall? 0 - 0 0 -  Risk for fall due to : No Fall Risks Medication side effect No Fall Risks - -  Follow up Falls evaluation completed Falls evaluation completed;Education provided;Falls prevention discussed Falls evaluation completed Falls evaluation completed Falls evaluation completed    FALL RISK PREVENTION PERTAINING TO THE HOME:  Any stairs in or around the home? Yes  If so, are there any without handrails? No  Home free of loose throw rugs in walkways, pet beds, electrical cords, etc? Yes  Adequate lighting in your home to reduce risk of falls? Yes   ASSISTIVE DEVICES UTILIZED TO PREVENT FALLS:  Life alert? No  Use of a cane, walker or w/c? No  Grab bars in the bathroom? No  Shower chair or bench in shower? No  Elevated toilet seat or a handicapped toilet? No   TIMED UP AND GO:  Was the test performed?  N/A .  Length of time to ambulate 10 feet: N/A sec.     Cognitive Function:     6CIT Screen 02/27/2021 02/22/2020 01/12/2018  What Year? 0 points 0 points 0 points  What month? 0 points 0 points 0  points  What time? 0 points 0 points 0 points  Count back from 20 0 points 0 points 0 points  Months in reverse 0 points 0 points 0 points  Repeat phrase 0 points 2 points 2 points  Total Score 0 2 2    Immunizations Immunization History  Administered Date(s) Administered   Fluad Quad(high Dose 65+) 04/09/2019, 07/28/2020   Influenza,inj,Quad PF,6+ Mos 03/09/2016   Moderna Sars-Covid-2 Vaccination 07/04/2019, 08/01/2019   Pneumococcal Conjugate-13 01/11/2017   Pneumococcal Polysaccharide-23 01/12/2018   Tdap 12/30/2015   Zoster Recombinat (Shingrix) 02/07/2019, 05/15/2019    TDAP status: Up to date  Flu Vaccine status: Due, Education has been provided regarding the importance of this vaccine. Advised may receive this vaccine at local pharmacy or Health Dept. Aware to provide a copy of the vaccination record if obtained from local pharmacy or Health Dept. Verbalized acceptance and understanding.  Pneumococcal vaccine status: Up to date  Covid-19 vaccine status: Information provided on how to obtain vaccines.   Qualifies for Shingles Vaccine? Yes   Zostavax completed No   Shingrix Completed?: Yes  Screening Tests Health Maintenance  Topic Date Due   COVID-19 Vaccine (3 - Booster for Moderna series) 12/29/2019   INFLUENZA VACCINE  01/05/2021   COLONOSCOPY (Pts 45-41yrs Insurance coverage will need to be confirmed)  06/07/2021   MAMMOGRAM  01/08/2022   TETANUS/TDAP  12/29/2025   DEXA SCAN  Completed   Hepatitis C Screening  Completed   Zoster Vaccines- Shingrix  Completed   HPV VACCINES  Aged Out    Health Maintenance  Health Maintenance Due  Topic Date Due   COVID-19 Vaccine (3 - Booster for Moderna series) 12/29/2019   INFLUENZA VACCINE  01/05/2021    Colorectal cancer screening: Type of screening: Colonoscopy. Completed 06/08/2011. Repeat every 10 years  Mammogram status: Completed 01/08/21. Repeat every year  Bone Density status: Completed 03/29/17. Results  reflect: Bone density results: NORMAL. Repeat every 5 years.  Lung Cancer Screening: (Low Dose CT Chest recommended if Age 48-80 years, 30 pack-year currently smoking OR have quit w/in  15years.) does not qualify.   Lung Cancer Screening Referral: N/A  Additional Screening:  Hepatitis C Screening: does qualify; Completed 01/11/17  Vision Screening: Recommended annual ophthalmology exams for early detection of glaucoma and other disorders of the eye. Is the patient up to date with their annual eye exam?  Yes  Who is the provider or what is the name of the office in which the patient attends annual eye exams? [facility] If pt is not established with a provider, would they like to be referred to a provider to establish care? No .   Dental Screening: Recommended annual dental exams for proper oral hygiene  Community Resource Referral / Chronic Care Management: CRR required this visit?  No   CCM required this visit?  No      Plan:     I have personally reviewed and noted the following in the patient's chart:   Medical and social history Use of alcohol, tobacco or illicit drugs  Current medications and supplements including opioid prescriptions.  Functional ability and status Nutritional status Physical activity Advanced directives List of other physicians Hospitalizations, surgeries, and ER visits in previous 12 months Vitals Screenings to include cognitive, depression, and falls Referrals and appointments  In addition, I have reviewed and discussed with patient certain preventive protocols, quality metrics, and best practice recommendations. A written personalized care plan for preventive services as well as general preventive health recommendations were provided to patient.    Ms. Parrack , Thank you for taking time to come for your Medicare Wellness Visit. I appreciate your ongoing commitment to your health goals. Please review the following plan we discussed and let  me know if I can assist you in the future.   These are the goals we discussed:  Goals      DIET - INCREASE WATER INTAKE     Recommend drinking at least 6-8 glasses of water a day      Patient Stated     02/22/2020, stay active        This is a list of the screening recommended for you and due dates:  Health Maintenance  Topic Date Due   COVID-19 Vaccine (3 - Booster for Moderna series) 12/29/2019   Flu Shot  01/05/2021   Colon Cancer Screening  06/07/2021   Mammogram  01/08/2022   Tetanus Vaccine  12/29/2025   DEXA scan (bone density measurement)  Completed   Hepatitis C Screening: USPSTF Recommendation to screen - Ages 41-79 yo.  Completed   Zoster (Shingles) Vaccine  Completed   HPV Vaccine  Aged Out      [place], [place]   02/27/2021   Nurse Notes: Non Face to Face 60 Minutes

## 2021-02-27 NOTE — Patient Instructions (Signed)
Health Maintenance, Female Adopting a healthy lifestyle and getting preventive care are important in promoting health and wellness. Ask your health care provider about: The right schedule for you to have regular tests and exams. Things you can do on your own to prevent diseases and keep yourself healthy. What should I know about diet, weight, and exercise? Eat a healthy diet  Eat a diet that includes plenty of vegetables, fruits, low-fat dairy products, and lean protein. Do not eat a lot of foods that are high in solid fats, added sugars, or sodium. Maintain a healthy weight Body mass index (BMI) is used to identify weight problems. It estimates body fat based on height and weight. Your health care provider can help determine your BMI and help you achieve or maintain a healthy weight. Get regular exercise Get regular exercise. This is one of the most important things you can do for your health. Most adults should: Exercise for at least 150 minutes each week. The exercise should increase your heart rate and make you sweat (moderate-intensity exercise). Do strengthening exercises at least twice a week. This is in addition to the moderate-intensity exercise. Spend less time sitting. Even light physical activity can be beneficial. Watch cholesterol and blood lipids Have your blood tested for lipids and cholesterol at 69 years of age, then have this test every 5 years. Have your cholesterol levels checked more often if: Your lipid or cholesterol levels are high. You are older than 69 years of age. You are at high risk for heart disease. What should I know about cancer screening? Depending on your health history and family history, you may need to have cancer screening at various ages. This may include screening for: Breast cancer. Cervical cancer. Colorectal cancer. Skin cancer. Lung cancer. What should I know about heart disease, diabetes, and high blood pressure? Blood pressure and heart  disease High blood pressure causes heart disease and increases the risk of stroke. This is more likely to develop in people who have high blood pressure readings, are of African descent, or are overweight. Have your blood pressure checked: Every 3-5 years if you are 18-39 years of age. Every year if you are 40 years old or older. Diabetes Have regular diabetes screenings. This checks your fasting blood sugar level. Have the screening done: Once every three years after age 40 if you are at a normal weight and have a low risk for diabetes. More often and at a younger age if you are overweight or have a high risk for diabetes. What should I know about preventing infection? Hepatitis B If you have a higher risk for hepatitis B, you should be screened for this virus. Talk with your health care provider to find out if you are at risk for hepatitis B infection. Hepatitis C Testing is recommended for: Everyone born from 1945 through 1965. Anyone with known risk factors for hepatitis C. Sexually transmitted infections (STIs) Get screened for STIs, including gonorrhea and chlamydia, if: You are sexually active and are younger than 69 years of age. You are older than 69 years of age and your health care provider tells you that you are at risk for this type of infection. Your sexual activity has changed since you were last screened, and you are at increased risk for chlamydia or gonorrhea. Ask your health care provider if you are at risk. Ask your health care provider about whether you are at high risk for HIV. Your health care provider may recommend a prescription medicine   to help prevent HIV infection. If you choose to take medicine to prevent HIV, you should first get tested for HIV. You should then be tested every 3 months for as long as you are taking the medicine. Pregnancy If you are about to stop having your period (premenopausal) and you may become pregnant, seek counseling before you get  pregnant. Take 400 to 800 micrograms (mcg) of folic acid every day if you become pregnant. Ask for birth control (contraception) if you want to prevent pregnancy. Osteoporosis and menopause Osteoporosis is a disease in which the bones lose minerals and strength with aging. This can result in bone fractures. If you are 65 years old or older, or if you are at risk for osteoporosis and fractures, ask your health care provider if you should: Be screened for bone loss. Take a calcium or vitamin D supplement to lower your risk of fractures. Be given hormone replacement therapy (HRT) to treat symptoms of menopause. Follow these instructions at home: Lifestyle Do not use any products that contain nicotine or tobacco, such as cigarettes, e-cigarettes, and chewing tobacco. If you need help quitting, ask your health care provider. Do not use street drugs. Do not share needles. Ask your health care provider for help if you need support or information about quitting drugs. Alcohol use Do not drink alcohol if: Your health care provider tells you not to drink. You are pregnant, may be pregnant, or are planning to become pregnant. If you drink alcohol: Limit how much you use to 0-1 drink a day. Limit intake if you are breastfeeding. Be aware of how much alcohol is in your drink. In the [place] one drink equals one 12 oz bottle of beer (355 mL), one 5 oz glass of wine (148 mL), or one 1 oz glass of hard liquor (44 mL). General instructions Schedule regular health, dental, and eye exams. Stay current with your vaccines. Tell your health care provider if: You often feel depressed. You have ever been abused or do not feel safe at home. Summary Adopting a healthy lifestyle and getting preventive care are important in promoting health and wellness. Follow your health care provider's instructions about healthy diet, exercising, and getting tested or screened for diseases. Follow your health care provider's  instructions on monitoring your cholesterol and blood pressure. This information is not intended to replace advice given to you by your health care provider. Make sure you discuss any questions you have with your health care provider. Document Revised: 08/01/2020 Document Reviewed: 05/17/2018 Elsevier Patient Education  2022 Elsevier Inc.  

## 2021-03-10 ENCOUNTER — Other Ambulatory Visit: Payer: Self-pay | Admitting: Nurse Practitioner

## 2021-03-10 DIAGNOSIS — G2581 Restless legs syndrome: Secondary | ICD-10-CM

## 2021-03-24 DIAGNOSIS — M5416 Radiculopathy, lumbar region: Secondary | ICD-10-CM | POA: Diagnosis not present

## 2021-03-24 DIAGNOSIS — M62830 Muscle spasm of back: Secondary | ICD-10-CM | POA: Diagnosis not present

## 2021-03-24 DIAGNOSIS — M5126 Other intervertebral disc displacement, lumbar region: Secondary | ICD-10-CM | POA: Diagnosis not present

## 2021-03-24 DIAGNOSIS — M47816 Spondylosis without myelopathy or radiculopathy, lumbar region: Secondary | ICD-10-CM | POA: Diagnosis not present

## 2021-03-24 DIAGNOSIS — M5136 Other intervertebral disc degeneration, lumbar region: Secondary | ICD-10-CM | POA: Diagnosis not present

## 2021-03-30 ENCOUNTER — Other Ambulatory Visit: Payer: Self-pay | Admitting: Physical Medicine and Rehabilitation

## 2021-03-30 ENCOUNTER — Other Ambulatory Visit: Payer: Self-pay

## 2021-03-30 DIAGNOSIS — M47816 Spondylosis without myelopathy or radiculopathy, lumbar region: Secondary | ICD-10-CM

## 2021-04-04 ENCOUNTER — Other Ambulatory Visit: Payer: Self-pay | Admitting: Nurse Practitioner

## 2021-04-04 NOTE — Telephone Encounter (Signed)
Requested Prescriptions  Pending Prescriptions Disp Refills  . traZODone (DESYREL) 50 MG tablet [Pharmacy Med Name: traZODone HCl 50 MG Oral Tablet] 90 tablet 0    Sig: TAKE 1/2 TO 1 (ONE-HALF TO ONE) TABLET BY MOUTH AT BEDTIME AS NEEDED FOR SLEEP     Psychiatry: Antidepressants - Serotonin Modulator Passed - 04/04/2021  2:58 PM      Passed - Valid encounter within last 6 months    Recent Outpatient Visits          2 months ago Chronic bilateral low back pain without sciatica   [place] Cannady, Jolene T, NP   5 months ago BMI 40.0-44.9, adult ([place])   [place], Jolene T, NP   5 months ago Cellulitis of left lower extremity   [place] Cannady, Jolene T, NP   7 months ago Aortic atherosclerosis ([place])   [place], Lauren A, NP   8 months ago Aortic atherosclerosis ([place])   [place], Barbaraann Faster, NP      Future Appointments            In 3 months Cannady, Barbaraann Faster, NP [facility]

## 2021-04-08 ENCOUNTER — Other Ambulatory Visit: Payer: Self-pay

## 2021-04-08 ENCOUNTER — Ambulatory Visit
Admission: RE | Admit: 2021-04-08 | Discharge: 2021-04-08 | Disposition: A | Discharge status: Home / Self Care | Payer: Medicare HMO | Source: Ambulatory Visit | Attending: Physical Medicine and Rehabilitation | Admitting: Physical Medicine and Rehabilitation

## 2021-04-08 DIAGNOSIS — M47816 Spondylosis without myelopathy or radiculopathy, lumbar region: Secondary | ICD-10-CM | POA: Insufficient documentation

## 2021-04-08 DIAGNOSIS — M5136 Other intervertebral disc degeneration, lumbar region: Secondary | ICD-10-CM | POA: Diagnosis not present

## 2021-04-08 IMAGING — MR MR LUMBAR SPINE W/O CM
5 series · 31 of 48 positions shown · non-contrast
Comparison: MR lumbar [DATE].

CLINICAL DATA: Lumbar spondylosis. Chronic low back pain radiating
into both legs.

EXAM:
MRI LUMBAR SPINE WITHOUT CONTRAST
TECHNIQUE: Multiplanar, multisequence MR imaging of the lumbar spine was
performed. No intravenous contrast was administered.

[Series 5: T2 · sagittal · 4.0mm · 0.81mm/px · 6 of 17 slices shown (1 of 2)]
[im 1/17]
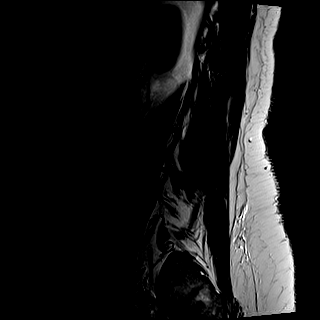
[im 4/17]
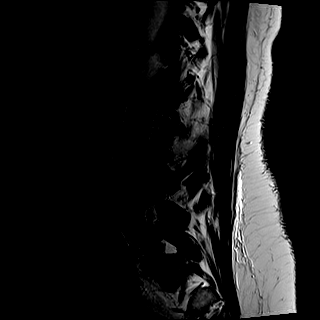
[im 7/17]
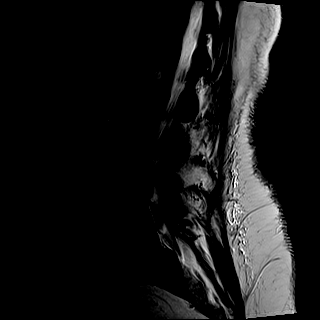
[im 10/17]
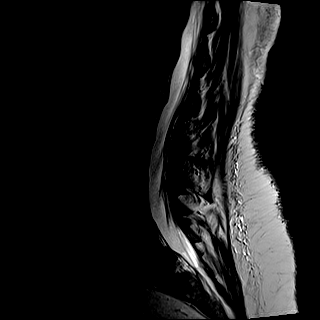
[im 13/17]
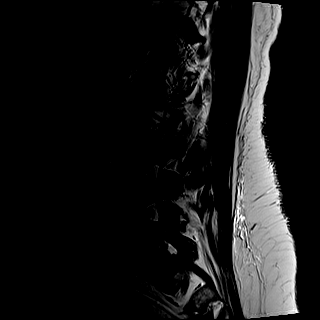
[im 17/17]
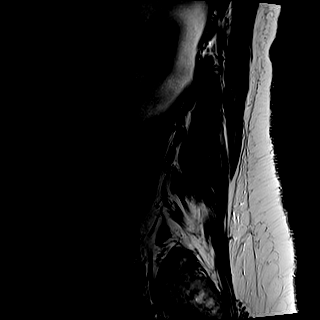

[Series 6: T1 · sagittal · 4.0mm · 0.81mm/px · 7 of 17 slices shown (1 of 2)]
[im 1/17]
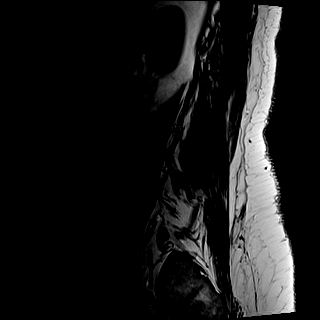
[im 3/17]
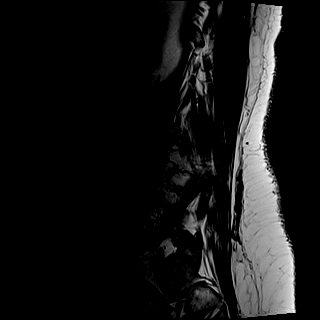
[im 6/17]
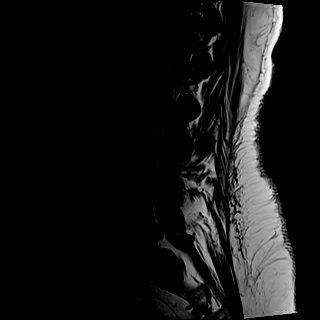
[im 9/17]
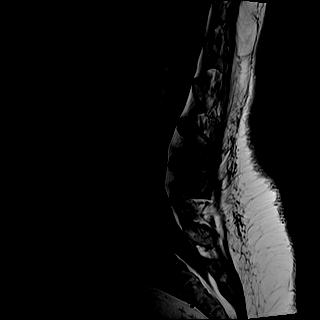
[im 11/17]
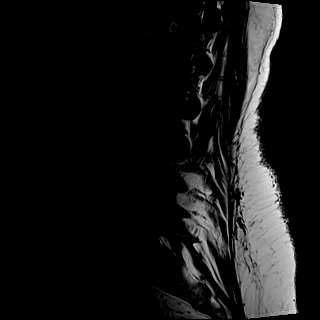
[im 14/17]
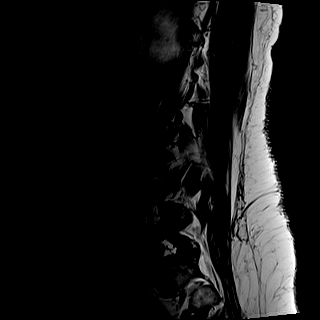
[im 17/17]
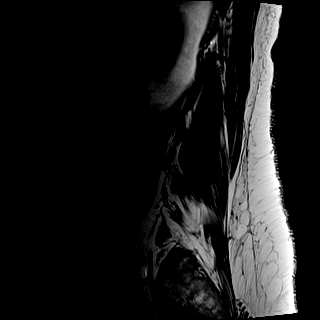

[Series 7: STIR · sagittal · 4.0mm · 0.41mm/px · 2 of 17 slices shown]
[im 1/17]
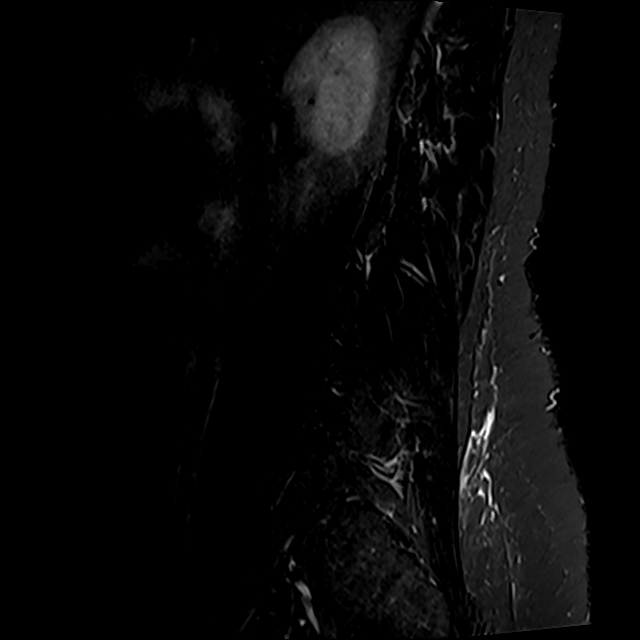
[im 3/17]
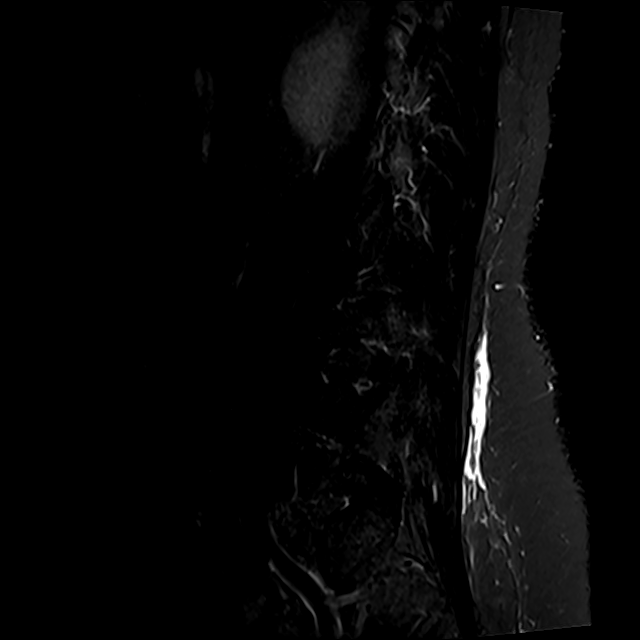

[Series 8: T2 · axial · 4.0mm · 0.78mm/px · z∈[-129,+88]mm · 8 of 37 slices shown (2 of 2)]
[im 1/37]
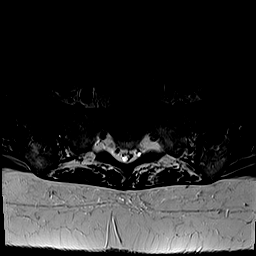
[im 6/37]
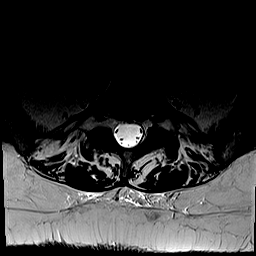
[im 12/37]
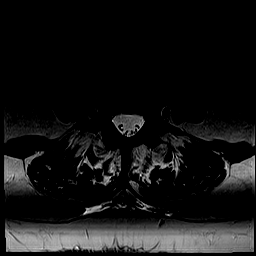
[im 17/37]
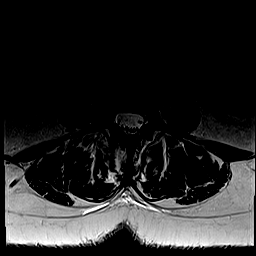
[im 20/37]
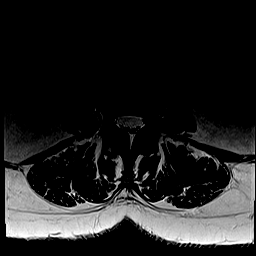
[im 25/37]
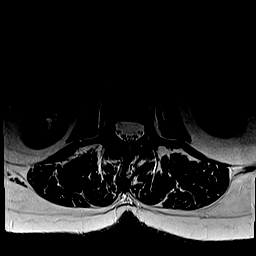
[im 31/37]
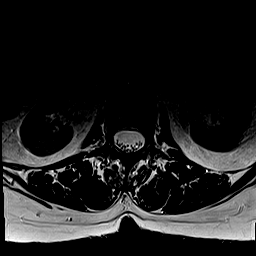
[im 37/37]
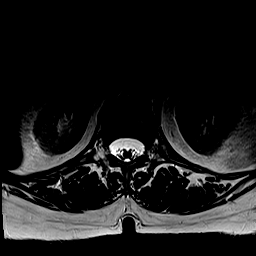

[Series 9: T1 · axial · 4.0mm · 0.39mm/px · z∈[-129,+88]mm · 8 of 37 slices shown (2 of 2)]
[im 1/37]
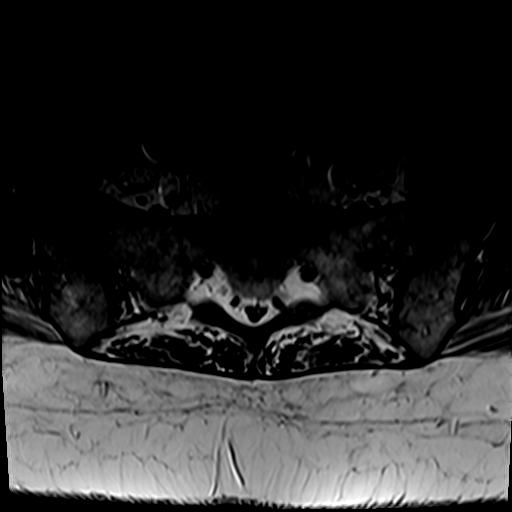
[im 6/37]
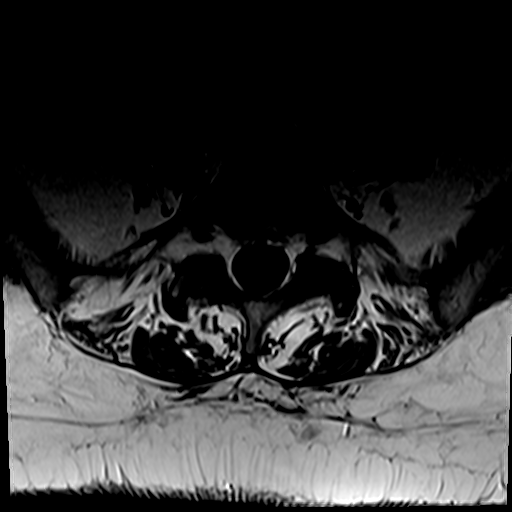
[im 12/37]
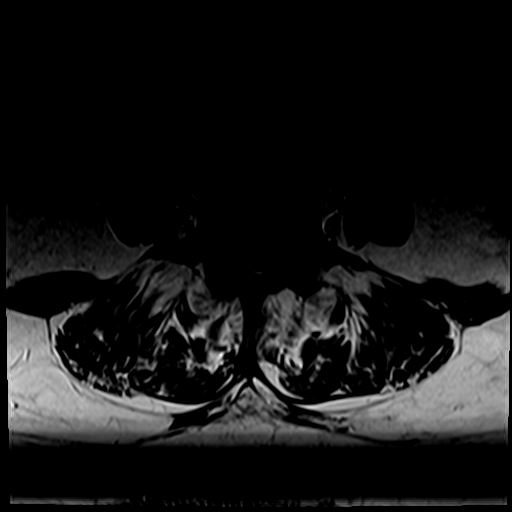
[im 17/37]
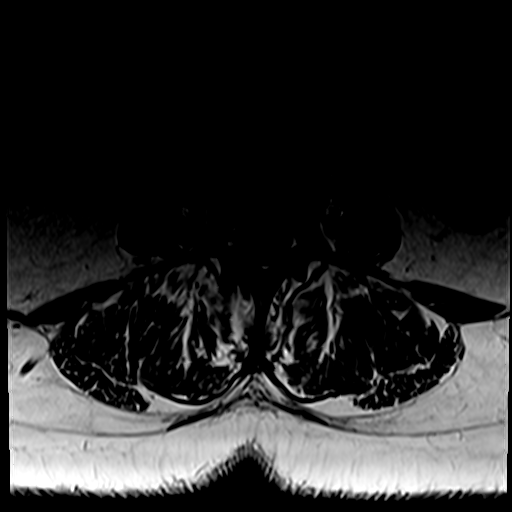
[im 20/37]
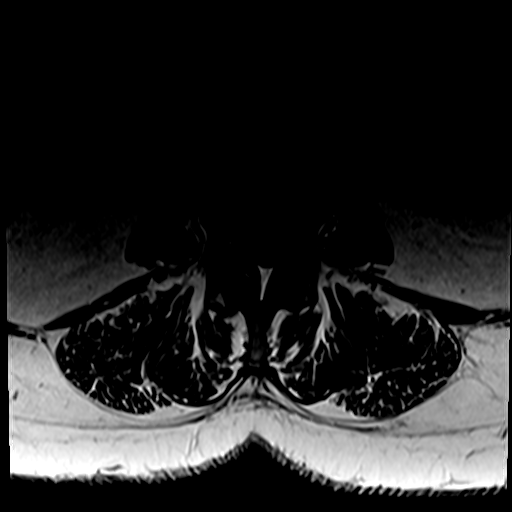
[im 25/37]
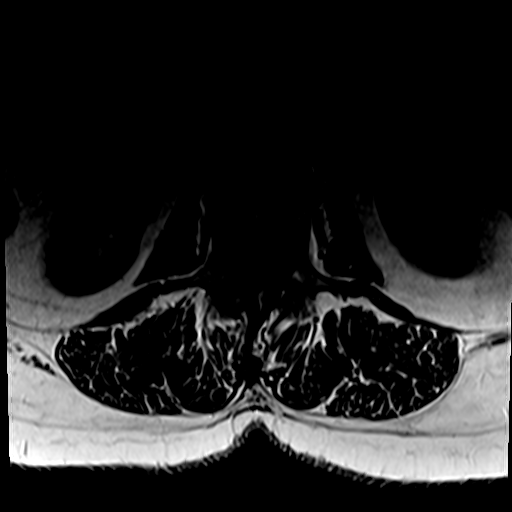
[im 31/37]
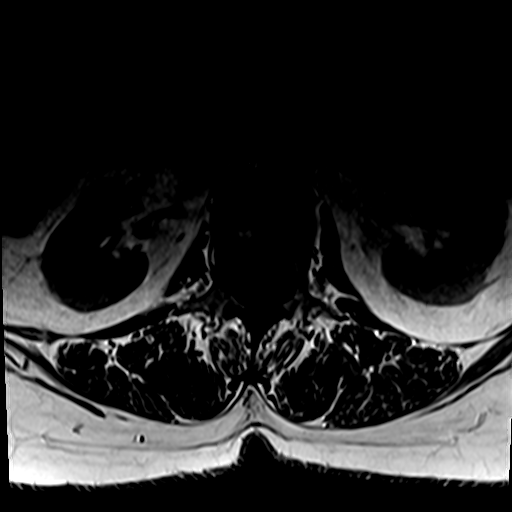
[im 37/37]
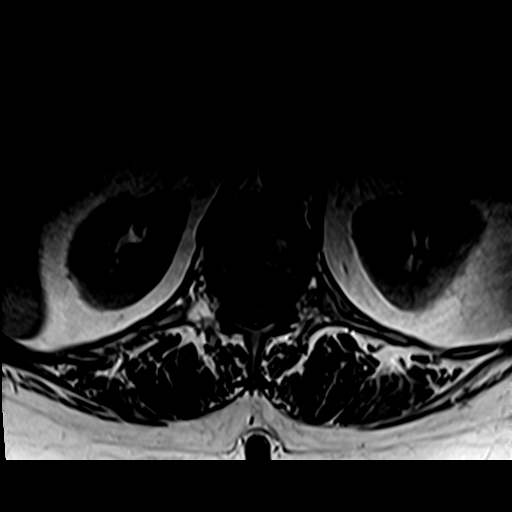

[31 of 48 positions shown; findings below may reference images not displayed]

FINDINGS: Segmentation:  Standard.

Alignment:  Unchanged grade 1 anterolisthesis of L5 on S1.

Vertebrae: No fracture or suspicious marrow lesion. Scattered small
vertebral hemangiomas. New mild degenerative endplate edema at
L5-S1.

Conus medullaris and cauda equina: Conus extends to the L1 level.
Conus and cauda equina appear normal.

Paraspinal and other soft tissues: Unremarkable.

Disc levels:

Disc desiccation throughout the lumbar spine with mild disc space
narrowing at L1-2 and L4-5 and moderate narrowing at L5-S1.

T12-L1: At most trace disc bulging without stenosis.

L1-2: Shallow central to right paracentral disc protrusion without
stenosis, unchanged.

L2-3: Minimal disc bulging and moderate right greater than left
facet hypertrophy without stenosis, unchanged.

L3-4: Minimal disc bulging and moderate facet and ligamentum flavum
hypertrophy without stenosis, unchanged.

L4-5: Moderate facet hypertrophy without disc herniation or
stenosis, unchanged.

L5-S1: The left foraminal disc extrusion on the prior study has
decreased in size. Anterolisthesis with disc uncovering, a small
residual left foraminal disc protrusion, and severe facet
hypertrophy result in mild residual left neural foraminal stenosis,
improved from prior. Patent spinal canal and right neural foramen.
IMPRESSION: 1. Decreased size of a left foraminal disc herniation at L5-S1 with
improved, mild left neural foraminal stenosis.
2. Unchanged disc and facet degeneration elsewhere without stenosis.

## 2021-04-16 DIAGNOSIS — M5416 Radiculopathy, lumbar region: Secondary | ICD-10-CM | POA: Diagnosis not present

## 2021-04-16 DIAGNOSIS — M5126 Other intervertebral disc displacement, lumbar region: Secondary | ICD-10-CM | POA: Diagnosis not present

## 2021-04-24 ENCOUNTER — Other Ambulatory Visit: Payer: Self-pay

## 2021-04-24 ENCOUNTER — Encounter: Payer: Self-pay | Admitting: Emergency Medicine

## 2021-04-24 ENCOUNTER — Emergency Department
Admission: EM | Admit: 2021-04-24 | Discharge: 2021-04-24 | Disposition: A | Discharge status: Home / Self Care | Payer: Medicare HMO | Attending: Emergency Medicine | Admitting: Emergency Medicine

## 2021-04-24 ENCOUNTER — Emergency Department: Payer: Medicare HMO

## 2021-04-24 DIAGNOSIS — Z7982 Long term (current) use of aspirin: Secondary | ICD-10-CM | POA: Insufficient documentation

## 2021-04-24 DIAGNOSIS — Z79899 Other long term (current) drug therapy: Secondary | ICD-10-CM | POA: Diagnosis not present

## 2021-04-24 DIAGNOSIS — W19XXXA Unspecified fall, initial encounter: Secondary | ICD-10-CM | POA: Insufficient documentation

## 2021-04-24 DIAGNOSIS — M545 Low back pain, unspecified: Secondary | ICD-10-CM | POA: Diagnosis not present

## 2021-04-24 DIAGNOSIS — S34109A Unspecified injury to unspecified level of lumbar spinal cord, initial encounter: Secondary | ICD-10-CM | POA: Diagnosis present

## 2021-04-24 DIAGNOSIS — Z8616 Personal history of COVID-19: Secondary | ICD-10-CM | POA: Insufficient documentation

## 2021-04-24 DIAGNOSIS — S32010A Wedge compression fracture of first lumbar vertebra, initial encounter for closed fracture: Secondary | ICD-10-CM | POA: Insufficient documentation

## 2021-04-24 IMAGING — CR DG LUMBAR SPINE 2-3V
3 series · 3 of 3 positions shown · non-contrast
Comparison: [DATE]

CLINICAL DATA: Status post fall with lower back pain.

EXAM:
LUMBAR SPINE - 2-3 VIEW

[l-spine ap]
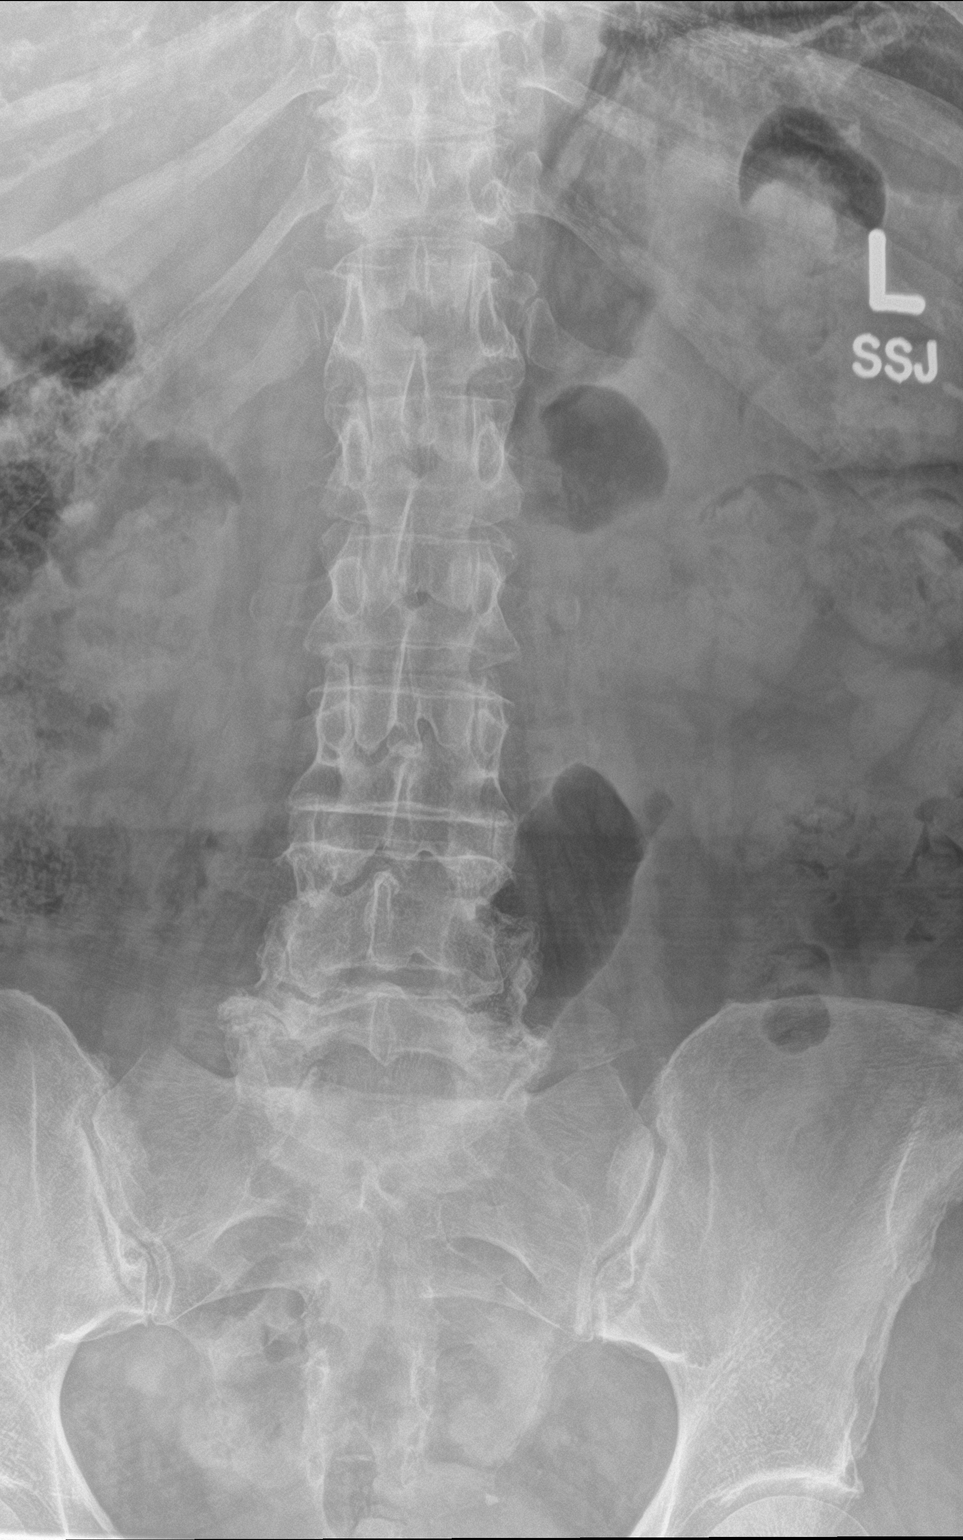

[l-spine lat]
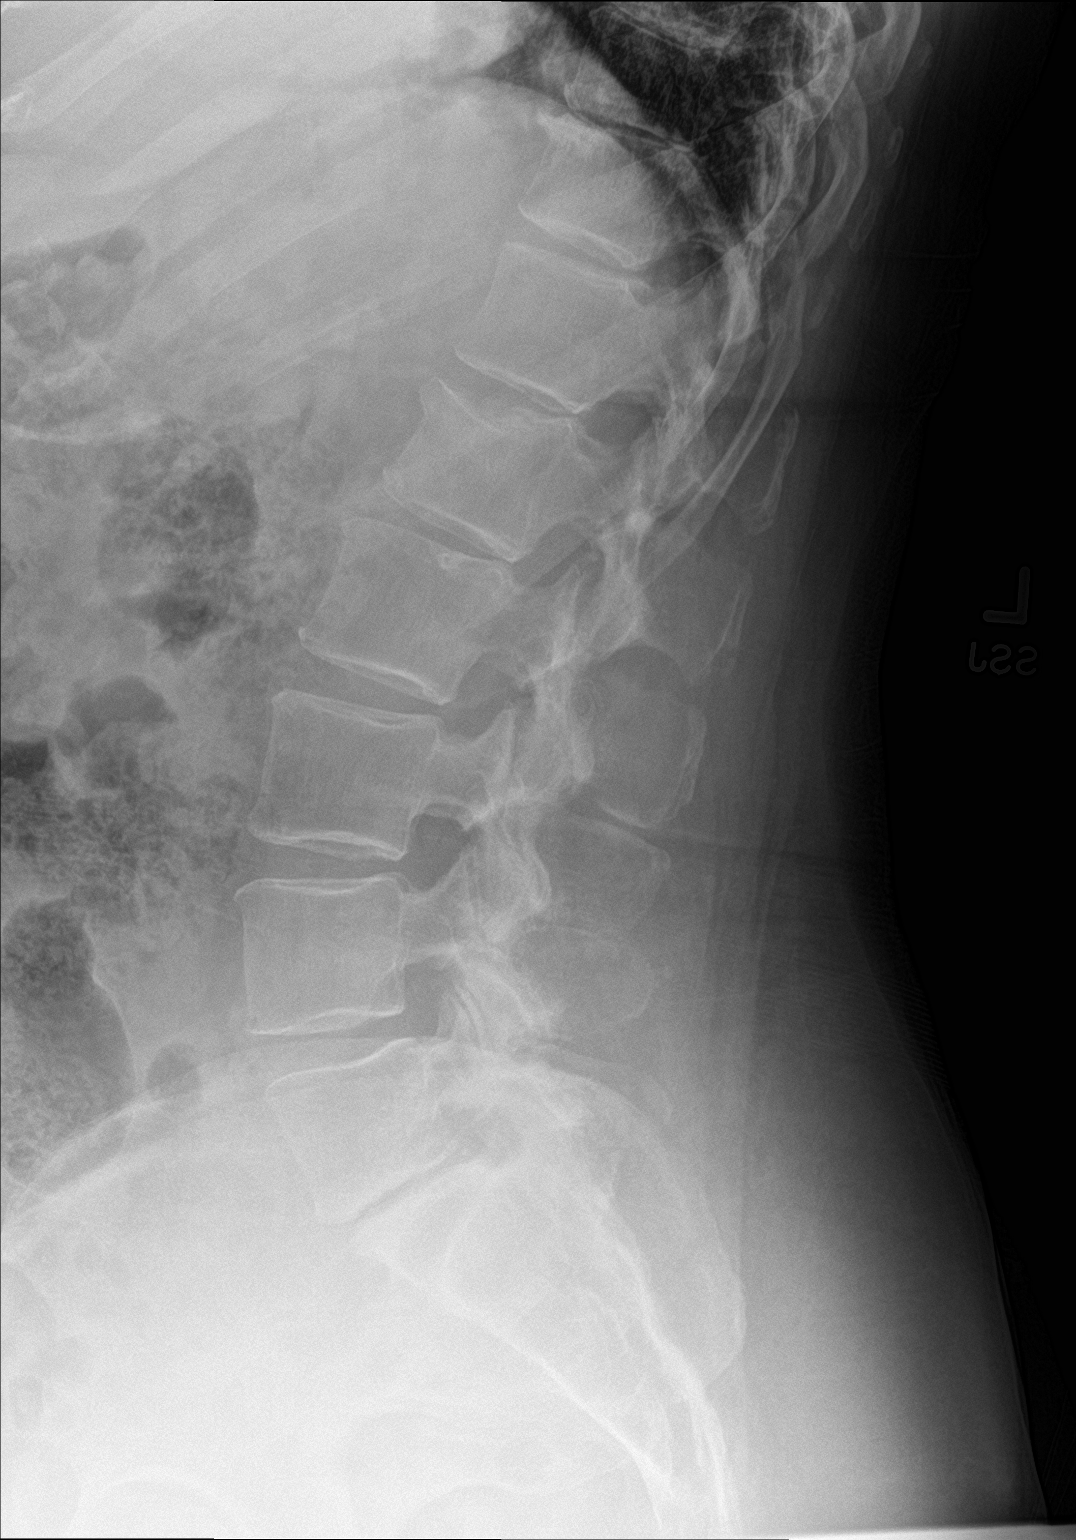

[l-spine spot]
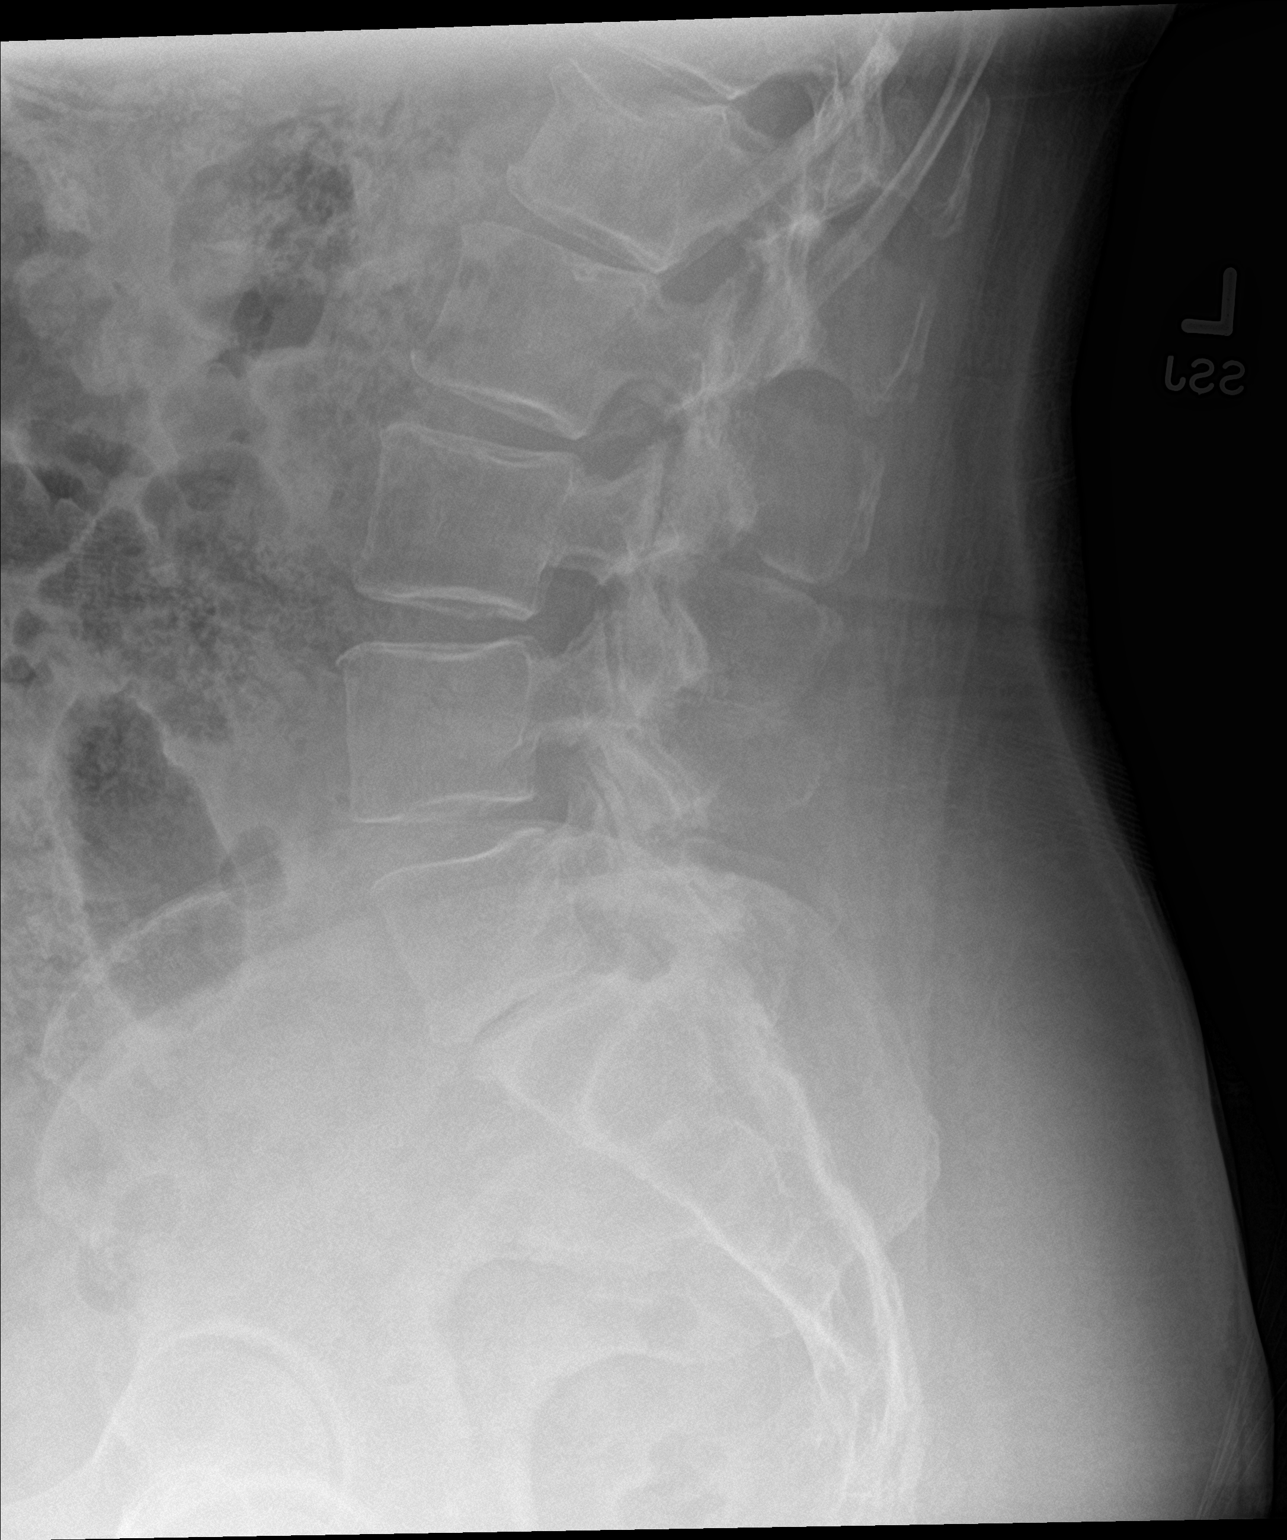

[3 of 3 positions shown; findings below may reference images not displayed]

FINDINGS: Acute compression fracture deformity is seen involving the L1
vertebral body. Alignment is normal. Moderate severity endplate
sclerosis is seen at the level of L5-S1. Mild to moderate severity
intervertebral disc space narrowing is noted at the level of L1-L2
and L5-S1.
IMPRESSION: 1. Acute compression fracture deformity of the L1 vertebral body.
2. Mild to moderate degenerative disc disease at L1-L2 and L5-S1.

## 2021-04-24 MED ORDER — HYDROCODONE-IBUPROFEN 5-200 MG PO TABS: 1.0000 | ORAL_TABLET | Freq: Three times a day (TID) | ORAL | 0 refills | Status: AC | PRN

## 2021-04-24 MED ORDER — HYDROCODONE-ACETAMINOPHEN 5-325 MG PO TABS
1.0000 | ORAL_TABLET | Freq: Once | ORAL | Status: AC
  Administered 2021-04-24: 1 via ORAL
  Filled 2021-04-24: qty 1

## 2021-04-24 NOTE — ED Triage Notes (Signed)
Pt comes into the ED via POV c/o low back pain.  Pt states that she was raking leaves yesterday and backed up and tripped over a root and fell on her buttock.  Pt now c/o low back pain.  Pt ambulatory to triage in NAD at this time with even and unlabored respirations

## 2021-04-24 NOTE — ED Provider Notes (Signed)
[facility] Emergency Department Provider Note    ____________________________________________   Event Date/Time   First MD Initiated Contact with Patient 04/24/21 1928     (approximate)  I have reviewed the triage vital signs and the nursing notes.   HISTORY  Chief Complaint Back Pain    HPI Artrice Kraker is a 70 y.o. female, history of chronic back pain and RLS, presents to the emergency department for evaluation of low back pain. She states that she was raking leaves yesterday and backed up over a root and fell on her buttock.  Denies loss of consciousness or chest pain prior or after the fall.  She was able to ambulate after the fall, but noticed significantly more pain in her back while doing so.  Patient has been taking Tylenol at home with little relief. Denies headaches, neck pain, upper or lower extremity pain, fevers/chills, urinary symptoms, bowel incontinence, abdominal pain.    History limited by: No limitations.  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 Authorizing  Provider  hydrocodone-ibuprofen (VICOPROFEN) 5-200 MG tablet Take 1 tablet by mouth every 8 (eight) hours as needed for up to 7 days for pain. 04/24/21 05/01/21 Yes Teodoro Spray, PA  aspirin EC 81 MG tablet Take 81 mg by mouth daily. Swallow whole.    [provider]  citalopram (CELEXA) 20 MG tablet Take 1 tablet by mouth once daily 02/03/21   Cannady, Jolene T, NP  Fish Oil-Cholecalciferol (OMEGA-3 FISH OIL/VITAMIN D3 PO) Take by mouth.    [provider]  gabapentin (NEURONTIN) 300 MG capsule Take 1 capsule by mouth every night. 11/05/20   Cannady, Henrine Screws T, NP  meloxicam (MOBIC) 7.5 MG tablet Take 1 tablet (7.5 mg total) by mouth daily. 01/26/21   Marnee Guarneri T, NP  mupirocin ointment (BACTROBAN) 2 % Apply 1 application topically 2 (two) times daily. 10/29/20   Cannady, Henrine Screws T, NP  rOPINIRole (REQUIP) 1 MG tablet Take 1 tablet by mouth 4 times daily 03/10/21   Cannady, Jolene T, NP  tiZANidine (ZANAFLEX) 4 MG tablet TAKE 1 TABLET BY MOUTH EVERY 6 HOURS AS NEEDED FOR MUSCLE SPASM 11/05/20   Cannady, Henrine Screws T, NP  traZODone (DESYREL) 50 MG tablet TAKE 1/2 TO 1 (ONE-HALF TO ONE) TABLET BY MOUTH AT BEDTIME AS NEEDED FOR SLEEP 04/04/21   Marnee Guarneri T, NP    Allergies Patient has no known allergies.  Family History  Problem Relation Age of Onset   Cancer Mother        ovarian   Dementia Father  Arthritis Sister    Heart disease Brother        MI   Stroke Paternal Grandmother    Cancer Paternal Grandfather        lung   Breast cancer Neg Hx     Social History Social History   Tobacco Use   Smoking status: Never   Smokeless tobacco: Never  Vaping Use   Vaping Use: Never used  Substance Use Topics   Alcohol use: No    Alcohol/week: 0.0 standard drinks   Drug use: No    Review of Systems  Constitutional: Negative for fever/chills, weight loss, or fatigue.  Eyes: Negative for visual changes or discharge.  ENT: Negative for congestion, hearing  changes, or sore throat.  Gastrointestinal: Negative for abdominal pain, nausea/vomiting, or diarrhea.  Genitourinary: Negative for dysuria or hematuria.  Musculoskeletal: Positive for back pain.  Negative for joint pain.   Skin: Negative for rashes or lesions.  Neurological: Negative for headache, syncope, dizziness, tremors, or numbness/tingling.   10-point ROS otherwise negative. ____________________________________________   PHYSICAL EXAM:  VITAL SIGNS: ED Triage Vitals  Enc Vitals Group     BP 04/24/21 1842 (!) 175/78     Pulse Rate 04/24/21 1842 70     Resp 04/24/21 1842 17     Temp 04/24/21 1842 98 F (36.7 C)     Temp Source 04/24/21 1842 Oral     SpO2 04/24/21 1842 99 %     Weight 04/24/21 1841 240 lb 8.4 oz (109.1 kg)     Height 04/24/21 1841 5\' 6"  (1.676 m)     Head Circumference --      Peak Flow --      Pain Score 04/24/21 1841 10     Pain Loc --      Pain Edu? --      Excl. in [place]? --     Physical Exam Constitutional:      Appearance: Normal appearance.  HENT:     Head: Normocephalic and atraumatic.     Nose: Nose normal.     Mouth/Throat:     Mouth: Mucous membranes are moist.     Pharynx: Oropharynx is clear.  Eyes:     Extraocular Movements: Extraocular movements intact.     Pupils: Pupils are equal, round, and reactive to light.  Cardiovascular:     Rate and Rhythm: Normal rate and regular rhythm.     Pulses: Normal pulses.     Heart sounds: Normal heart sounds.  Pulmonary:     Effort: Pulmonary effort is normal.     Breath sounds: Normal breath sounds.  Abdominal:     General: Abdomen is flat. There is no distension.     Palpations: Abdomen is soft.     Tenderness: There is no abdominal tenderness. There is no guarding.  Musculoskeletal:     Cervical back: Normal range of motion and neck supple. No tenderness.     Comments: Midline spinal tenderness noted along the lumbar region.  No gross deformities or step-off.  Pulses, motor, sensation  intact in the upper and lower extremities.  No cervical tenderness.  Patient was able to ambulate across the room with no assistance, as well as flex and extend the back with no resistance.   Skin:    General: Skin is warm and dry.  Neurological:     General: No focal deficit present.     Mental Status: She is alert and oriented to person, place, and time. Mental  status is at baseline.     Cranial Nerves: No cranial nerve deficit.     Sensory: No sensory deficit.     Motor: No weakness.     Coordination: Coordination normal.     Deep Tendon Reflexes: Reflexes normal.  Psychiatric:        Mood and Affect: Mood normal.        Behavior: Behavior normal.        Thought Content: Thought content normal.        Judgment: Judgment normal.     ____________________________________________    LABS  (all labs ordered are listed, but only abnormal results are displayed)  Labs Reviewed - No data to display   ____________________________________________   EKG None.   ____________________________________________    RADIOLOGY I personally viewed and evaluated these images as part of my medical decision making, as well as reviewing the written report by the radiologist.  ED Provider Interpretation: I agree with the radiologist interpretation of these findings.  Acute compression fracture deformity along the L1 vertebral body.  Normal alignment.  DG Lumbar Spine 2-3 Views  Result Date: 04/24/2021 CLINICAL DATA:  Status post fall with lower back pain. EXAM: LUMBAR SPINE - 2-3 VIEW COMPARISON:  December 25, 2018 FINDINGS: Acute compression fracture deformity is seen involving the L1 vertebral body. Alignment is normal. Moderate severity endplate sclerosis is seen at the level of L5-S1. Mild to moderate severity intervertebral disc space narrowing is noted at the level of L1-L2 and L5-S1. IMPRESSION: 1. Acute compression fracture deformity of the L1 vertebral body. 2. Mild to moderate  degenerative disc disease at L1-L2 and L5-S1. Electronically Signed   By: Virgina Norfolk M.D.   On: 04/24/2021 19:10    ____________________________________________   PROCEDURES  Procedures   Medications  HYDROcodone-acetaminophen (NORCO/VICODIN) 5-325 MG per tablet 1 tablet (1 tablet Oral Given 04/24/21 2032)    Critical Care performed: No  ____________________________________________   INITIAL IMPRESSION / ASSESSMENT AND PLAN / ED COURSE  Pertinent labs & imaging results that were available during my care of the patient were reviewed by me and considered in my medical decision making (see chart for details).      Josefina Rynders is a 70 y.o. female, history of chronic back pain and RLS, presents to the emergency department for evaluation of low back pain following mechanical fall onto her buttock.   Patient appears well.  NAD at this time.  Physical exam significant for midline spinal tenderness around the L1-L2 region.  Pulse, motor, sensation intact in all extremities.  No endorsement of urinary or fecal incontinence.  Patient was able to ambulate across the room without assistance and maintained ability to flex and extend her back moderately.  Vital signs are within normal limits for the patient  Patient is currently uncomfortable and is experiencing a mild to moderate amount of pain.  We will go ahead and treat with hydrocodone-acetaminophen. Initial imaging shows L1 compression fracture with normal alignment.    I suspect this L1 compression fracture to be acute in nature and the cause for the patient's current presentation . Given the patient's ambulatory status and no significant separation between vertebrae on imaging, will plan to discharge this patient with pain control and neurosurgery follow-up.      ____________________________________________   FINAL CLINICAL IMPRESSION(S) / ED DIAGNOSES  Final diagnoses:  Compression fracture of L1 lumbar vertebra,  closed, initial encounter ([place])     NEW MEDICATIONS STARTED DURING THIS VISIT:  ED  Discharge Orders          Ordered    hydrocodone-ibuprofen (VICOPROFEN) 5-200 MG tablet  Every 8 hours PRN        04/24/21 2033             Note:  This document was prepared using Dragon voice recognition software and may include unintentional dictation errors.    Teodoro Spray, [place] 04/24/21 2038    Naaman Plummer, MD 04/24/21 2239

## 2021-04-26 ENCOUNTER — Encounter: Payer: Self-pay | Admitting: Nurse Practitioner

## 2021-04-30 ENCOUNTER — Other Ambulatory Visit: Payer: Self-pay | Admitting: Nurse Practitioner

## 2021-05-01 NOTE — Telephone Encounter (Signed)
Requested Prescriptions  Pending Prescriptions Disp Refills  . citalopram (CELEXA) 20 MG tablet [Pharmacy Med Name: Citalopram Hydrobromide 20 MG Oral Tablet] 90 tablet 0    Sig: Take 1 tablet by mouth once daily     Psychiatry:  Antidepressants - SSRI Passed - 04/30/2021  6:01 PM      Passed - Valid encounter within last 6 months    Recent Outpatient Visits          3 months ago Chronic bilateral low back pain without sciatica   [facility] [place], Jolene T, NP   5 months ago BMI 40.0-44.9, adult ([place])   [place], Jolene T, NP   6 months ago Cellulitis of left lower extremity   [place], Jolene T, NP   8 months ago Aortic atherosclerosis ([place])   [place], Lauren A, NP   9 months ago Aortic atherosclerosis ([place])   [place], Barbaraann Faster, NP      Future Appointments            In 3 months Cannady, Barbaraann Faster, NP [facility]

## 2021-05-12 DIAGNOSIS — M5136 Other intervertebral disc degeneration, lumbar region: Secondary | ICD-10-CM | POA: Diagnosis not present

## 2021-05-12 DIAGNOSIS — M5126 Other intervertebral disc displacement, lumbar region: Secondary | ICD-10-CM | POA: Diagnosis not present

## 2021-05-12 DIAGNOSIS — M5416 Radiculopathy, lumbar region: Secondary | ICD-10-CM | POA: Diagnosis not present

## 2021-05-25 DIAGNOSIS — M5126 Other intervertebral disc displacement, lumbar region: Secondary | ICD-10-CM | POA: Diagnosis not present

## 2021-05-25 DIAGNOSIS — M5416 Radiculopathy, lumbar region: Secondary | ICD-10-CM | POA: Diagnosis not present

## 2021-05-26 DIAGNOSIS — M4316 Spondylolisthesis, lumbar region: Secondary | ICD-10-CM | POA: Diagnosis not present

## 2021-05-26 DIAGNOSIS — M8588 Other specified disorders of bone density and structure, other site: Secondary | ICD-10-CM | POA: Diagnosis not present

## 2021-05-26 DIAGNOSIS — M4856XA Collapsed vertebra, not elsewhere classified, lumbar region, initial encounter for fracture: Secondary | ICD-10-CM | POA: Diagnosis not present

## 2021-05-26 DIAGNOSIS — Z8781 Personal history of (healed) traumatic fracture: Secondary | ICD-10-CM | POA: Diagnosis not present

## 2021-06-09 ENCOUNTER — Telehealth: Payer: Self-pay | Admitting: Nurse Practitioner

## 2021-06-09 NOTE — Telephone Encounter (Signed)
Routing to provider  

## 2021-06-09 NOTE — Telephone Encounter (Signed)
Erika Anderson with [employer] is calling to ask if a bone density test can be ordered at the next ov on 07/30/21 during her CPE. Pt sustained a  Compression fracture of L1 lumbar vertebra, closed, initial encounter ([place]) on 04/24/21 only seen in the ED.  [phone]

## 2021-06-15 DIAGNOSIS — M5126 Other intervertebral disc displacement, lumbar region: Secondary | ICD-10-CM | POA: Diagnosis not present

## 2021-06-15 DIAGNOSIS — M62830 Muscle spasm of back: Secondary | ICD-10-CM | POA: Diagnosis not present

## 2021-06-15 DIAGNOSIS — M5416 Radiculopathy, lumbar region: Secondary | ICD-10-CM | POA: Diagnosis not present

## 2021-06-15 DIAGNOSIS — M5136 Other intervertebral disc degeneration, lumbar region: Secondary | ICD-10-CM | POA: Diagnosis not present

## 2021-06-24 DIAGNOSIS — M545 Low back pain, unspecified: Secondary | ICD-10-CM | POA: Diagnosis not present

## 2021-06-24 DIAGNOSIS — M5126 Other intervertebral disc displacement, lumbar region: Secondary | ICD-10-CM | POA: Diagnosis not present

## 2021-06-24 DIAGNOSIS — M5416 Radiculopathy, lumbar region: Secondary | ICD-10-CM | POA: Diagnosis not present

## 2021-06-24 DIAGNOSIS — M62830 Muscle spasm of back: Secondary | ICD-10-CM | POA: Diagnosis not present

## 2021-06-24 DIAGNOSIS — M5136 Other intervertebral disc degeneration, lumbar region: Secondary | ICD-10-CM | POA: Diagnosis not present

## 2021-06-26 ENCOUNTER — Other Ambulatory Visit: Payer: Self-pay | Admitting: Nurse Practitioner

## 2021-06-26 DIAGNOSIS — M545 Low back pain, unspecified: Secondary | ICD-10-CM | POA: Diagnosis not present

## 2021-06-26 DIAGNOSIS — M5126 Other intervertebral disc displacement, lumbar region: Secondary | ICD-10-CM | POA: Diagnosis not present

## 2021-06-26 DIAGNOSIS — M5136 Other intervertebral disc degeneration, lumbar region: Secondary | ICD-10-CM | POA: Diagnosis not present

## 2021-06-26 DIAGNOSIS — M62830 Muscle spasm of back: Secondary | ICD-10-CM | POA: Diagnosis not present

## 2021-06-26 DIAGNOSIS — M5416 Radiculopathy, lumbar region: Secondary | ICD-10-CM | POA: Diagnosis not present

## 2021-06-26 NOTE — Telephone Encounter (Signed)
Requested Prescriptions  Pending Prescriptions Disp Refills   traZODone (DESYREL) 50 MG tablet [Pharmacy Med Name: traZODone HCl 50 MG Oral Tablet] 90 tablet 0    Sig: TAKE 1/2 TO 1 (ONE-HALF TO ONE) TABLET BY MOUTH AT BEDTIME AS NEEDED FOR SLEEP     Psychiatry: Antidepressants - Serotonin Modulator Passed - 06/26/2021  6:26 AM      Passed - Valid encounter within last 6 months    Recent Outpatient Visits          5 months ago Chronic bilateral low back pain without sciatica   [place] Cannady, Jolene T, NP   7 months ago BMI 40.0-44.9, adult ([place])   [place], Jolene T, NP   8 months ago Cellulitis of left lower extremity   [place] Cannady, Jolene T, NP   10 months ago Aortic atherosclerosis ([place])   [place], Lauren A, NP   11 months ago Aortic atherosclerosis ([place])   [place], Barbaraann Faster, NP      Future Appointments            In 1 month Cannady, Barbaraann Faster, NP [facility]            tiZANidine (ZANAFLEX) 4 MG tablet [Pharmacy Med Name: tiZANidine HCl 4 MG Oral Tablet] 30 tablet 0    Sig: TAKE 1 TABLET BY MOUTH EVERY 6 HOURS AS NEEDED FOR MUSCLE SPASM     Not Delegated - Cardiovascular:  Alpha-2 Agonists - tizanidine Failed - 06/26/2021  6:26 AM      Failed - This refill cannot be delegated      Passed - Valid encounter within last 6 months    Recent Outpatient Visits          5 months ago Chronic bilateral low back pain without sciatica   [place] Cannady, Jolene T, NP   7 months ago BMI 40.0-44.9, adult ([place])   [place] Cannady, Jolene T, NP   8 months ago Cellulitis of left lower extremity   [place], Jolene T, NP   10 months ago Aortic atherosclerosis ([place])   [place], Lauren A, NP   11 months ago Aortic atherosclerosis ([place])   [place], Barbaraann Faster, NP      Future Appointments            In 1 month Cannady, Barbaraann Faster, NP [facility], [facility]

## 2021-06-26 NOTE — Telephone Encounter (Signed)
Requested medication (s) are due for refill today: yes  Requested medication (s) are on the active medication list: yes  Last refill:  05/22/21  Future visit scheduled: 07/30/21  Notes to clinic:  This medication can not be delegated, please assess.   Requested Prescriptions  Pending Prescriptions Disp Refills   tiZANidine (ZANAFLEX) 4 MG tablet [Pharmacy Med Name: tiZANidine HCl 4 MG Oral Tablet] 30 tablet 0    Sig: TAKE 1 TABLET BY MOUTH EVERY 6 HOURS AS NEEDED FOR MUSCLE SPASM     Not Delegated - Cardiovascular:  Alpha-2 Agonists - tizanidine Failed - 06/26/2021  6:26 AM      Failed - This refill cannot be delegated      Passed - Valid encounter within last 6 months    Recent Outpatient Visits           5 months ago Chronic bilateral low back pain without sciatica   [place] Cannady, Jolene T, NP   7 months ago BMI 40.0-44.9, adult ([place])   [place], Jolene T, NP   8 months ago Cellulitis of left lower extremity   [place] Cannady, Jolene T, NP   10 months ago Aortic atherosclerosis ([place])   [place], Lauren A, NP   11 months ago Aortic atherosclerosis ([place])   [place], Barbaraann Faster, NP       Future Appointments             In 1 month Cannady, Mc[place]osh T, NP [facility], [facility]            Signed Prescriptions Disp Refills   traZODone (DESYREL) 50 MG tablet 90 tablet 0    Sig: TAKE 1/2 TO 1 (ONE-HALF TO ONE) TABLET BY MOUTH AT BEDTIME AS NEEDED FOR SLEEP     Psychiatry: Antidepressants - Serotonin Modulator Passed - 06/26/2021  6:26 AM      Passed - Valid encounter within last 6 months    Recent Outpatient Visits           5 months ago Chronic bilateral low back pain without sciatica   [place] Cannady, Jolene T, NP   7 months ago BMI 40.0-44.9, adult ([place])   [place], Jolene T, NP   8 months ago Cellulitis of left  lower extremity   [place] Cannady, Jolene T, NP   10 months ago Aortic atherosclerosis ([place])   [place], Lauren A, NP   11 months ago Aortic atherosclerosis ([place])   [place], Barbaraann Faster, NP       Future Appointments             In 1 month Cannady, Barbaraann Faster, NP [facility], [facility]

## 2021-06-29 ENCOUNTER — Other Ambulatory Visit: Payer: Self-pay | Admitting: Nurse Practitioner

## 2021-06-30 NOTE — Telephone Encounter (Signed)
Requested medications are due for refill today.  Provider to review  Requested medications are on the active medications list.  yes  Last refill. 06/26/2021  Future visit scheduled.   yes  Notes to clinic.  Medication refilled 06/26/2021. Medication not delegated.    Requested Prescriptions  Pending Prescriptions Disp Refills   tiZANidine (ZANAFLEX) 4 MG tablet [Pharmacy Med Name: tiZANidine HCl 4 MG Oral Tablet] 30 tablet 0    Sig: TAKE 1 TABLET BY MOUTH EVERY 6 HOURS AS NEEDED FOR MUSCLE SPASM     Not Delegated - Cardiovascular:  Alpha-2 Agonists - tizanidine Failed - 06/29/2021  6:37 PM      Failed - This refill cannot be delegated      Passed - Valid encounter within last 6 months    Recent Outpatient Visits           5 months ago Chronic bilateral low back pain without sciatica   [place] Cannady, Jolene T, NP   7 months ago BMI 40.0-44.9, adult ([place])   [place] Cannady, Jolene T, NP   8 months ago Cellulitis of left lower extremity   [place], Jolene T, NP   10 months ago Aortic atherosclerosis ([place])   [place], Lauren A, NP   11 months ago Aortic atherosclerosis ([place])   [place], Barbaraann Faster, NP       Future Appointments             In 1 month Cannady, Barbaraann Faster, NP [facility], [facility]

## 2021-07-01 DIAGNOSIS — M62830 Muscle spasm of back: Secondary | ICD-10-CM | POA: Diagnosis not present

## 2021-07-01 DIAGNOSIS — M545 Low back pain, unspecified: Secondary | ICD-10-CM | POA: Diagnosis not present

## 2021-07-01 DIAGNOSIS — M5126 Other intervertebral disc displacement, lumbar region: Secondary | ICD-10-CM | POA: Diagnosis not present

## 2021-07-01 DIAGNOSIS — M5136 Other intervertebral disc degeneration, lumbar region: Secondary | ICD-10-CM | POA: Diagnosis not present

## 2021-07-01 DIAGNOSIS — M5416 Radiculopathy, lumbar region: Secondary | ICD-10-CM | POA: Diagnosis not present

## 2021-07-03 DIAGNOSIS — M5126 Other intervertebral disc displacement, lumbar region: Secondary | ICD-10-CM | POA: Diagnosis not present

## 2021-07-03 DIAGNOSIS — M5416 Radiculopathy, lumbar region: Secondary | ICD-10-CM | POA: Diagnosis not present

## 2021-07-03 DIAGNOSIS — M62830 Muscle spasm of back: Secondary | ICD-10-CM | POA: Diagnosis not present

## 2021-07-03 DIAGNOSIS — M545 Low back pain, unspecified: Secondary | ICD-10-CM | POA: Diagnosis not present

## 2021-07-03 DIAGNOSIS — M5136 Other intervertebral disc degeneration, lumbar region: Secondary | ICD-10-CM | POA: Diagnosis not present

## 2021-07-05 ENCOUNTER — Other Ambulatory Visit: Payer: Self-pay | Admitting: Nurse Practitioner

## 2021-07-06 NOTE — Telephone Encounter (Signed)
Requested medication (s) are due for refill today:   Requested medication (s) are on the active medication list: Yes  Last refill:  06/26/21  Future visit scheduled: Yes  Notes to clinic:  See request.    Requested Prescriptions  Pending Prescriptions Disp Refills   tiZANidine (ZANAFLEX) 4 MG tablet [Pharmacy Med Name: tiZANidine HCl 4 MG Oral Tablet] 30 tablet 0    Sig: TAKE 1 TABLET BY MOUTH EVERY 6 HOURS AS NEEDED FOR MUSCLE SPASM     Not Delegated - Cardiovascular:  Alpha-2 Agonists - tizanidine Failed - 07/05/2021  1:58 PM      Failed - This refill cannot be delegated      Passed - Valid encounter within last 6 months    Recent Outpatient Visits           5 months ago Chronic bilateral low back pain without sciatica   [place] Cannady, Jolene T, NP   8 months ago BMI 40.0-44.9, adult ([place])   [place] Cannady, Jolene T, NP   8 months ago Cellulitis of left lower extremity   [place], Jolene T, NP   10 months ago Aortic atherosclerosis ([place])   [place], Lauren A, NP   11 months ago Aortic atherosclerosis ([place])   [place], Barbaraann Faster, NP       Future Appointments             In 3 weeks Cannady, Barbaraann Faster, NP [facility], [facility]

## 2021-07-08 DIAGNOSIS — M5136 Other intervertebral disc degeneration, lumbar region: Secondary | ICD-10-CM | POA: Diagnosis not present

## 2021-07-08 DIAGNOSIS — M5416 Radiculopathy, lumbar region: Secondary | ICD-10-CM | POA: Diagnosis not present

## 2021-07-08 DIAGNOSIS — M545 Low back pain, unspecified: Secondary | ICD-10-CM | POA: Diagnosis not present

## 2021-07-08 DIAGNOSIS — M62830 Muscle spasm of back: Secondary | ICD-10-CM | POA: Diagnosis not present

## 2021-07-08 DIAGNOSIS — M5126 Other intervertebral disc displacement, lumbar region: Secondary | ICD-10-CM | POA: Diagnosis not present

## 2021-07-09 DIAGNOSIS — G629 Polyneuropathy, unspecified: Secondary | ICD-10-CM | POA: Diagnosis not present

## 2021-07-09 DIAGNOSIS — I10 Essential (primary) hypertension: Secondary | ICD-10-CM | POA: Diagnosis not present

## 2021-07-09 DIAGNOSIS — M1990 Unspecified osteoarthritis, unspecified site: Secondary | ICD-10-CM | POA: Diagnosis not present

## 2021-07-09 DIAGNOSIS — F419 Anxiety disorder, unspecified: Secondary | ICD-10-CM | POA: Diagnosis not present

## 2021-07-09 DIAGNOSIS — G4700 Insomnia, unspecified: Secondary | ICD-10-CM | POA: Diagnosis not present

## 2021-07-09 DIAGNOSIS — G8929 Other chronic pain: Secondary | ICD-10-CM | POA: Diagnosis not present

## 2021-07-09 DIAGNOSIS — Z7982 Long term (current) use of aspirin: Secondary | ICD-10-CM | POA: Diagnosis not present

## 2021-07-09 DIAGNOSIS — R69 Illness, unspecified: Secondary | ICD-10-CM | POA: Diagnosis not present

## 2021-07-09 DIAGNOSIS — Z008 Encounter for other general examination: Secondary | ICD-10-CM | POA: Diagnosis not present

## 2021-07-09 DIAGNOSIS — G2581 Restless legs syndrome: Secondary | ICD-10-CM | POA: Diagnosis not present

## 2021-07-09 DIAGNOSIS — Z9181 History of falling: Secondary | ICD-10-CM | POA: Diagnosis not present

## 2021-07-09 DIAGNOSIS — Z809 Family history of malignant neoplasm, unspecified: Secondary | ICD-10-CM | POA: Diagnosis not present

## 2021-07-09 DIAGNOSIS — E785 Hyperlipidemia, unspecified: Secondary | ICD-10-CM | POA: Diagnosis not present

## 2021-07-10 DIAGNOSIS — M5416 Radiculopathy, lumbar region: Secondary | ICD-10-CM | POA: Diagnosis not present

## 2021-07-10 DIAGNOSIS — M62830 Muscle spasm of back: Secondary | ICD-10-CM | POA: Diagnosis not present

## 2021-07-10 DIAGNOSIS — M545 Low back pain, unspecified: Secondary | ICD-10-CM | POA: Diagnosis not present

## 2021-07-10 DIAGNOSIS — M5126 Other intervertebral disc displacement, lumbar region: Secondary | ICD-10-CM | POA: Diagnosis not present

## 2021-07-10 DIAGNOSIS — M5136 Other intervertebral disc degeneration, lumbar region: Secondary | ICD-10-CM | POA: Diagnosis not present

## 2021-07-15 DIAGNOSIS — M5136 Other intervertebral disc degeneration, lumbar region: Secondary | ICD-10-CM | POA: Diagnosis not present

## 2021-07-15 DIAGNOSIS — M545 Low back pain, unspecified: Secondary | ICD-10-CM | POA: Diagnosis not present

## 2021-07-15 DIAGNOSIS — M62830 Muscle spasm of back: Secondary | ICD-10-CM | POA: Diagnosis not present

## 2021-07-15 DIAGNOSIS — M5126 Other intervertebral disc displacement, lumbar region: Secondary | ICD-10-CM | POA: Diagnosis not present

## 2021-07-15 DIAGNOSIS — M5416 Radiculopathy, lumbar region: Secondary | ICD-10-CM | POA: Diagnosis not present

## 2021-07-17 DIAGNOSIS — M5136 Other intervertebral disc degeneration, lumbar region: Secondary | ICD-10-CM | POA: Diagnosis not present

## 2021-07-17 DIAGNOSIS — M5416 Radiculopathy, lumbar region: Secondary | ICD-10-CM | POA: Diagnosis not present

## 2021-07-17 DIAGNOSIS — M62830 Muscle spasm of back: Secondary | ICD-10-CM | POA: Diagnosis not present

## 2021-07-17 DIAGNOSIS — M5126 Other intervertebral disc displacement, lumbar region: Secondary | ICD-10-CM | POA: Diagnosis not present

## 2021-07-17 DIAGNOSIS — M545 Low back pain, unspecified: Secondary | ICD-10-CM | POA: Diagnosis not present

## 2021-07-21 DIAGNOSIS — M5137 Other intervertebral disc degeneration, lumbosacral region: Secondary | ICD-10-CM | POA: Diagnosis not present

## 2021-07-21 DIAGNOSIS — Z8781 Personal history of (healed) traumatic fracture: Secondary | ICD-10-CM | POA: Diagnosis not present

## 2021-07-21 DIAGNOSIS — S32010A Wedge compression fracture of first lumbar vertebra, initial encounter for closed fracture: Secondary | ICD-10-CM | POA: Diagnosis not present

## 2021-07-21 DIAGNOSIS — M5489 Other dorsalgia: Secondary | ICD-10-CM | POA: Diagnosis not present

## 2021-07-22 DIAGNOSIS — M545 Low back pain, unspecified: Secondary | ICD-10-CM | POA: Diagnosis not present

## 2021-07-22 DIAGNOSIS — M62830 Muscle spasm of back: Secondary | ICD-10-CM | POA: Diagnosis not present

## 2021-07-22 DIAGNOSIS — M5126 Other intervertebral disc displacement, lumbar region: Secondary | ICD-10-CM | POA: Diagnosis not present

## 2021-07-22 DIAGNOSIS — M5416 Radiculopathy, lumbar region: Secondary | ICD-10-CM | POA: Diagnosis not present

## 2021-07-22 DIAGNOSIS — M5136 Other intervertebral disc degeneration, lumbar region: Secondary | ICD-10-CM | POA: Diagnosis not present

## 2021-07-24 DIAGNOSIS — M5126 Other intervertebral disc displacement, lumbar region: Secondary | ICD-10-CM | POA: Diagnosis not present

## 2021-07-24 DIAGNOSIS — M5136 Other intervertebral disc degeneration, lumbar region: Secondary | ICD-10-CM | POA: Diagnosis not present

## 2021-07-24 DIAGNOSIS — M5416 Radiculopathy, lumbar region: Secondary | ICD-10-CM | POA: Diagnosis not present

## 2021-07-24 DIAGNOSIS — M62830 Muscle spasm of back: Secondary | ICD-10-CM | POA: Diagnosis not present

## 2021-07-24 DIAGNOSIS — M545 Low back pain, unspecified: Secondary | ICD-10-CM | POA: Diagnosis not present

## 2021-07-26 DIAGNOSIS — Z8781 Personal history of (healed) traumatic fracture: Secondary | ICD-10-CM | POA: Insufficient documentation

## 2021-07-26 NOTE — Patient Instructions (Signed)
Healthy Eating °Following a healthy eating pattern may help you to achieve and maintain a healthy body weight, reduce the risk of chronic disease, and live a long and productive life. It is important to follow a healthy eating pattern at an appropriate calorie level for your body. Your nutritional needs should be met primarily through food by choosing a variety of nutrient-rich foods. °What are tips for following this plan? °Reading food labels °Read labels and choose the following: °Reduced or low sodium. °Juices with 100% fruit juice. °Foods with low saturated fats and high polyunsaturated and monounsaturated fats. °Foods with whole grains, such as whole wheat, cracked wheat, brown rice, and wild rice. °Whole grains that are fortified with folic acid. This is recommended for women who are pregnant or who want to become pregnant. °Read labels and avoid the following: °Foods with a lot of added sugars. These include foods that contain brown sugar, corn sweetener, corn syrup, dextrose, fructose, glucose, high-fructose corn syrup, honey, invert sugar, lactose, malt syrup, maltose, molasses, raw sugar, sucrose, trehalose, or turbinado sugar. °Do not eat more than the following amounts of added sugar per day: °6 teaspoons (25 g) for women. °9 teaspoons (38 g) for men. °Foods that contain processed or refined starches and grains. °Refined grain products, such as white flour, degermed cornmeal, white bread, and white rice. °Shopping °Choose nutrient-rich snacks, such as vegetables, whole fruits, and nuts. Avoid high-calorie and high-sugar snacks, such as potato chips, fruit snacks, and candy. °Use oil-based dressings and spreads on foods instead of solid fats such as butter, stick margarine, or cream cheese. °Limit pre-made sauces, mixes, and "instant" products such as flavored rice, instant noodles, and ready-made pasta. °Try more plant-protein sources, such as tofu, tempeh, black beans, edamame, lentils, nuts, and  seeds. °Explore eating plans such as the Mediterranean diet or vegetarian diet. °Cooking °Use oil to sauté or stir-fry foods instead of solid fats such as butter, stick margarine, or lard. °Try baking, boiling, grilling, or broiling instead of frying. °Remove the fatty part of meats before cooking. °Steam vegetables in water or broth. °Meal planning ° °At meals, imagine dividing your plate into fourths: °One-half of your plate is fruits and vegetables. °One-fourth of your plate is whole grains. °One-fourth of your plate is protein, especially lean meats, poultry, eggs, tofu, beans, or nuts. °Include low-fat dairy as part of your daily diet. °Lifestyle °Choose healthy options in all settings, including home, work, school, restaurants, or stores. °Prepare your food safely: °Wash your hands after handling raw meats. °Keep food preparation surfaces clean by regularly washing with hot, soapy water. °Keep raw meats separate from ready-to-eat foods, such as fruits and vegetables. °Cook seafood, meat, poultry, and eggs to the recommended internal temperature. °Store foods at safe temperatures. In general: °Keep cold foods at 40°F (4.4°C) or below. °Keep hot foods at 140°F (60°C) or above. °Keep your freezer at 0°F (-17.8°C) or below. °Foods are no longer safe to eat when they have been between the temperatures of 40°-140°F (4.4-60°C) for more than 2 hours. °What foods should I eat? °Fruits °Aim to eat 2 cup-equivalents of fresh, canned (in natural juice), or frozen fruits each day. Examples of 1 cup-equivalent of fruit include 1 small apple, 8 large strawberries, 1 cup canned fruit, ½ cup dried fruit, or 1 cup 100% juice. °Vegetables °Aim to eat 2½-3 cup-equivalents of fresh and frozen vegetables each day, including different varieties and colors. Examples of 1 cup-equivalent of vegetables include 2 medium carrots, 2 cups raw,   leafy greens, 1 cup chopped vegetable (raw or cooked), or 1 medium baked potato. °Grains °Aim to  eat 6 ounce-equivalents of whole grains each day. Examples of 1 ounce-equivalent of grains include 1 slice of bread, 1 cup ready-to-eat cereal, 3 cups popcorn, or ½ cup cooked rice, pasta, or cereal. °Meats and other proteins °Aim to eat 5-6 ounce-equivalents of protein each day. Examples of 1 ounce-equivalent of protein include 1 egg, 1/2 cup nuts or seeds, or 1 tablespoon (16 g) peanut butter. A cut of meat or fish that is the size of a deck of cards is about 3-4 ounce-equivalents. °Of the protein you eat each week, try to have at least 8 ounces come from seafood. This includes salmon, trout, herring, and anchovies. °Dairy °Aim to eat 3 cup-equivalents of fat-free or low-fat dairy each day. Examples of 1 cup-equivalent of dairy include 1 cup (240 mL) milk, 8 ounces (250 g) yogurt, 1½ ounces (44 g) natural cheese, or 1 cup (240 mL) fortified soy milk. °Fats and oils °Aim for about 5 teaspoons (21 g) per day. Choose monounsaturated fats, such as canola and olive oils, avocados, peanut butter, and most nuts, or polyunsaturated fats, such as sunflower, corn, and soybean oils, walnuts, pine nuts, sesame seeds, sunflower seeds, and flaxseed. °Beverages °Aim for six 8-oz glasses of water per day. Limit coffee to three to five 8-oz cups per day. °Limit caffeinated beverages that have added calories, such as soda and energy drinks. °Limit alcohol intake to no more than 1 drink a day for nonpregnant women and 2 drinks a day for men. One drink equals 12 oz of beer (355 mL), 5 oz of wine (148 mL), or 1½ oz of hard liquor (44 mL). °Seasoning and other foods °Avoid adding excess amounts of salt to your foods. Try flavoring foods with herbs and spices instead of salt. °Avoid adding sugar to foods. °Try using oil-based dressings, sauces, and spreads instead of solid fats. °This information is based on general [place] nutrition guidelines. For more information, visit choosemyplate.gov. Exact amounts may vary based on your nutrition  needs. °Summary °A healthy eating plan may help you to maintain a healthy weight, reduce the risk of chronic diseases, and stay active throughout your life. °Plan your meals. Make sure you eat the right portions of a variety of nutrient-rich foods. °Try baking, boiling, grilling, or broiling instead of frying. °Choose healthy options in all settings, including home, work, school, restaurants, or stores. °This information is not intended to replace advice given to you by your health care provider. Make sure you discuss any questions you have with your health care provider. °Document Revised: 01/20/2021 Document Reviewed: 01/20/2021 °Elsevier Patient Education © 2022 [employer] ° °

## 2021-07-27 DIAGNOSIS — M545 Low back pain, unspecified: Secondary | ICD-10-CM | POA: Diagnosis not present

## 2021-07-27 DIAGNOSIS — M5416 Radiculopathy, lumbar region: Secondary | ICD-10-CM | POA: Diagnosis not present

## 2021-07-27 DIAGNOSIS — M5126 Other intervertebral disc displacement, lumbar region: Secondary | ICD-10-CM | POA: Diagnosis not present

## 2021-07-27 DIAGNOSIS — M5136 Other intervertebral disc degeneration, lumbar region: Secondary | ICD-10-CM | POA: Diagnosis not present

## 2021-07-27 DIAGNOSIS — G8929 Other chronic pain: Secondary | ICD-10-CM | POA: Diagnosis not present

## 2021-07-29 DIAGNOSIS — M545 Low back pain, unspecified: Secondary | ICD-10-CM | POA: Diagnosis not present

## 2021-07-29 DIAGNOSIS — M5416 Radiculopathy, lumbar region: Secondary | ICD-10-CM | POA: Diagnosis not present

## 2021-07-29 DIAGNOSIS — M5126 Other intervertebral disc displacement, lumbar region: Secondary | ICD-10-CM | POA: Diagnosis not present

## 2021-07-29 DIAGNOSIS — M5136 Other intervertebral disc degeneration, lumbar region: Secondary | ICD-10-CM | POA: Diagnosis not present

## 2021-07-29 DIAGNOSIS — M62830 Muscle spasm of back: Secondary | ICD-10-CM | POA: Diagnosis not present

## 2021-07-30 ENCOUNTER — Other Ambulatory Visit: Payer: Self-pay | Admitting: Nurse Practitioner

## 2021-07-30 ENCOUNTER — Other Ambulatory Visit: Payer: Self-pay

## 2021-07-30 ENCOUNTER — Encounter: Payer: Self-pay | Admitting: Nurse Practitioner

## 2021-07-30 ENCOUNTER — Ambulatory Visit (INDEPENDENT_AMBULATORY_CARE_PROVIDER_SITE_OTHER): Payer: Medicare HMO | Admitting: Nurse Practitioner

## 2021-07-30 VITALS — BP 113/71 | HR 69 | Temp 97.6°F | Ht 65.0 in | Wt 244.2 lb

## 2021-07-30 DIAGNOSIS — R7301 Impaired fasting glucose: Secondary | ICD-10-CM

## 2021-07-30 DIAGNOSIS — Z780 Asymptomatic menopausal state: Secondary | ICD-10-CM

## 2021-07-30 DIAGNOSIS — I700 Atherosclerosis of aorta: Secondary | ICD-10-CM | POA: Diagnosis not present

## 2021-07-30 DIAGNOSIS — G2581 Restless legs syndrome: Secondary | ICD-10-CM

## 2021-07-30 DIAGNOSIS — Z6841 Body Mass Index (BMI) 40.0 and over, adult: Secondary | ICD-10-CM

## 2021-07-30 DIAGNOSIS — N951 Menopausal and female climacteric states: Secondary | ICD-10-CM | POA: Diagnosis not present

## 2021-07-30 DIAGNOSIS — M170 Bilateral primary osteoarthritis of knee: Secondary | ICD-10-CM | POA: Diagnosis not present

## 2021-07-30 DIAGNOSIS — Z0000 Encounter for general adult medical examination without abnormal findings: Secondary | ICD-10-CM

## 2021-07-30 DIAGNOSIS — Z1211 Encounter for screening for malignant neoplasm of colon: Secondary | ICD-10-CM

## 2021-07-30 DIAGNOSIS — E669 Obesity, unspecified: Secondary | ICD-10-CM | POA: Insufficient documentation

## 2021-07-30 DIAGNOSIS — G8929 Other chronic pain: Secondary | ICD-10-CM

## 2021-07-30 DIAGNOSIS — E7800 Pure hypercholesterolemia, unspecified: Secondary | ICD-10-CM | POA: Diagnosis not present

## 2021-07-30 DIAGNOSIS — E559 Vitamin D deficiency, unspecified: Secondary | ICD-10-CM | POA: Diagnosis not present

## 2021-07-30 DIAGNOSIS — Z8781 Personal history of (healed) traumatic fracture: Secondary | ICD-10-CM

## 2021-07-30 DIAGNOSIS — M545 Low back pain, unspecified: Secondary | ICD-10-CM | POA: Diagnosis not present

## 2021-07-30 DIAGNOSIS — F5104 Psychophysiologic insomnia: Secondary | ICD-10-CM

## 2021-07-30 LAB — MICROALBUMIN, URINE WAIVED
Creatinine, Urine Waived: 300 mg/dL (ref 10–300)
Microalb, Ur Waived: 80 mg/L — ABNORMAL HIGH (ref 0–19)

## 2021-07-30 LAB — BAYER DCA HB A1C WAIVED: HB A1C (BAYER DCA - WAIVED): 5 % (ref 4.8–5.6)

## 2021-07-30 MED ORDER — GABAPENTIN 300 MG PO CAPS: ORAL_CAPSULE | ORAL | 4 refills | Status: DC

## 2021-07-30 MED ORDER — ROPINIROLE HCL 1 MG PO TABS: 1.0000 mg | ORAL_TABLET | Freq: Four times a day (QID) | ORAL | 4 refills | Status: DC

## 2021-07-30 MED ORDER — PEG 3350-KCL-NA BICARB-NACL 420 G PO SOLR: 4000.0000 mL | Freq: Once | ORAL | 0 refills | Status: AC

## 2021-07-30 MED ORDER — CITALOPRAM HYDROBROMIDE 20 MG PO TABS: 20.0000 mg | ORAL_TABLET | Freq: Every day | ORAL | 4 refills | Status: DC

## 2021-07-30 NOTE — Assessment & Plan Note (Signed)
Chronic and stable with Requip and Gabapentin.  Continue current regimen and adjust as needed.  Refills sent as needed.  Monitor kidney function closely with medications.  CBC, CMP, TSH, A1c on labs today.

## 2021-07-30 NOTE — Assessment & Plan Note (Signed)
Chronic, with known Degenerative Disc. Continue collaboration with neurosurgery.  Recommend alternating heat and ice at home + scheduling Tylenol 1000 MG TID (max dose).  Avoid Ibuprofen unless needed.  Continue Gabapentin daily.  No red flag symptoms at this time.

## 2021-07-30 NOTE — Assessment & Plan Note (Signed)
Chronic, stable.  Recommend she cut back on Trazodone to 1/2 a pill in evening as needed and continue to cut back.  Do not take unless needed.  She wishes to reduce to discontinue this.

## 2021-07-30 NOTE — Assessment & Plan Note (Signed)
Recommended eating smaller high protein, low fat meals more frequently and exercising 30 mins a day 5 times a week with a goal of 10-15lb weight loss in the next 3 months. Patient voiced their understanding and motivation to adhere to these recommendations.  

## 2021-07-30 NOTE — Telephone Encounter (Signed)
Celexa duplicate refill request refused because pharmacy received order this morning at 11:15 AM for #90, 4 refills.

## 2021-07-30 NOTE — Assessment & Plan Note (Signed)
On 04/24/21, recommend she schedule DEXA repeat with mammogram in August, ordered today.  She agrees with this plan.  Continue collaboration with neurosurgery.

## 2021-07-30 NOTE — Progress Notes (Signed)
BP 113/71    Pulse 69    Temp 97.6 F (36.4 C) (Oral)    Ht '5\' 5"'  (1.651 m)    Wt 244 lb 3.2 oz (110.8 kg)    SpO2 96%    BMI 40.64 kg/m    Subjective:    Patient ID: Erika Anderson, female    DOB: 08-30-1950, 71 y.o.   MRN: [id number]  HPI: Erika Anderson is a 71 y.o. female presenting on 07/30/2021 for comprehensive medical examination. Current medical complaints include: right knee pain  She currently lives with: husband Menopausal Symptoms: taking Celexa with benefit  Currently being followed by Dr. Cari Caraway for history of compression fracture after fall to L1 on 04/24/21.  Sees Dr. Sharlet Salina for dry needling and physical therapy at this time.  No surgical intervention needed.  History of DEXA in 2018 with normal findings.  KNEE PAIN (RIGHT) Has been having right knee pain and would like to discuss injections again to right knee.  Seen by [facility] for this in past.   Duration: months Involved knee: right Mechanism of injury: unknown Location:anterior Onset: gradual Severity: 8/10  Quality:  dull and aching Frequency: intermittent Radiation: no Aggravating factors: weight bearing, bending, and movement  Alleviating factors: Tylenol  Status: worse Treatments attempted: rest, heat, and APAP  Relief with NSAIDs?:  mild Weakness with weight bearing or walking: no Sensation of giving way: no Locking: no Popping: yes Bruising: no Swelling: no Redness: no Paresthesias/decreased sensation: no Fevers: no   ELEVATED LDL Supplements: none Aspirin:  yes The 10-year ASCVD risk score (Arnett DK, et al., 2019) is: 7.2%   Values used to calculate the score:     Age: 61 years     Sex: Female     Is Non-Hispanic African American: No     Diabetic: No     Tobacco smoker: No     Systolic Blood Pressure: 662 mmHg     Is BP treated: No     HDL Cholesterol: 45 mg/dL     Total Cholesterol: 209 mg/dL Chest pain:  no Coronary artery disease:  no Family history CAD:   no Family history early CAD:  no   RESTLESS LEGS Continues on Requip and Gabapentin (both for back and legs).  She reports this offers benefit.  She does continue on Trazodone (1 tablet) for sleep at night, would like to try stopping this. Duration: chronic Location: lower legs Bilateral: yes Symmetric: yes Severity: mild Onset:  gradual Frequency:  intermittent Symptoms only occur while legs at rest: yes Sudden unintentional leg jerking: no Bed partner bothered by leg movements: no LE numbness: no Decreased sensation: no Weakness: no Insomnia: no Daytime somnolence: no Fatigue: no Alleviating factors: Requip and Gabapentin Aggravating factors: none Status: better Treatments attempted: Requip and Gabapentin  Depression Screen done today and results listed below:  Depression screen [facility] 2/9 07/30/2021 02/27/2021 07/28/2020 02/22/2020 01/24/2020  Decreased Interest 0 0 0 0 0  Down, Depressed, Hopeless 0 0 0 0 0  PHQ - 2 Score 0 0 0 0 0  Altered sleeping 1 - 0 - -  Tired, decreased energy 1 - 0 - -  Change in appetite 1 - 0 - -  Feeling bad or failure about yourself  0 - 0 - -  Trouble concentrating 0 - 0 - -  Moving slowly or fidgety/restless 0 - 0 - -  Suicidal thoughts 0 - 0 - -  PHQ-9 Score 3 - 0 - -  Difficult doing work/chores Not difficult at all - Not difficult at all - -    Fall Risk 01/24/2020 02/22/2020 02/27/2021 04/24/2021 07/30/2021  Falls in the past year? 0 0 0 - 1  Was there an injury with Fall? 0 - 0 - 1  Fall Risk Category Calculator 0 - 0 - 2  Fall Risk Category Low - Low - Moderate  Patient Fall Risk Level Low fall risk Low fall risk Low fall risk Low fall risk Moderate fall risk  Patient at Risk for Falls Due to No Fall Risks Medication side effect No Fall Risks - History of fall(s)  Fall risk Follow up Falls evaluation completed Falls evaluation completed;Education provided;Falls prevention discussed Falls evaluation completed - Education provided;Falls  prevention discussed    Functional Status Survey: Is the patient deaf or have difficulty hearing?: No Does the patient have difficulty seeing, even when wearing glasses/contacts?: No Does the patient have difficulty concentrating, remembering, or making decisions?: No Does the patient have difficulty walking or climbing stairs?: No Does the patient have difficulty dressing or bathing?: No Does the patient have difficulty doing errands alone such as visiting a doctor's office or shopping?: No    Past Medical History:  Past Medical History:  Diagnosis Date   Hyperlipidemia    Obesity (BMI 30-39.9)    Restless legs syndrome (RLS) 12/28/2014   RLS (restless legs syndrome)     Surgical History:  Past Surgical History:  Procedure Laterality Date   HEMORROIDECTOMY     TOTAL ABDOMINAL HYSTERECTOMY     ovaries remain    Medications:  Current Outpatient Medications on File Prior to Visit  Medication Sig   aspirin EC 81 MG tablet Take 81 mg by mouth daily. Swallow whole.   celecoxib (CELEBREX) 100 MG capsule Take 100 mg by mouth 2 (two) times daily.   citalopram (CELEXA) 20 MG tablet Take 1 tablet by mouth once daily   gabapentin (NEURONTIN) 300 MG capsule Take 1 capsule by mouth every night.   methocarbamol (ROBAXIN) 500 MG tablet Take 250-500 mg by mouth at bedtime as needed.   rOPINIRole (REQUIP) 1 MG tablet Take 1 tablet by mouth 4 times daily   traZODone (DESYREL) 50 MG tablet TAKE 1/2 TO 1 (ONE-HALF TO ONE) TABLET BY MOUTH AT BEDTIME AS NEEDED FOR SLEEP   No current facility-administered medications on file prior to visit.    Allergies:  No Known Allergies  Social History:  Social History   Socioeconomic History   Marital status: Married    Spouse name: Not on file   Number of children: Not on file   Years of education: 12   Highest education level: High school graduate  Occupational History   Occupation: retired  Tobacco Use   Smoking status: Never   Smokeless  tobacco: Never  Scientific laboratory technician Use: Never used  Substance and Sexual Activity   Alcohol use: No    Alcohol/week: 0.0 standard drinks   Drug use: No   Sexual activity: Not Currently  Other Topics Concern   Not on file  Social History Narrative   Helps take care of grandchildren    Social Determinants of Health   Financial Resource Strain: Low Risk    Difficulty of Paying Living Expenses: Not hard at all  Food Insecurity: No Food Insecurity   Worried About Charity fundraiser in the Last Year: Never true   [place] in the Last Year: Never true  Transportation  Needs: No Transportation Needs   Lack of Transportation (Medical): No   Lack of Transportation (Non-Medical): No  Physical Activity: Sufficiently Active   Days of Exercise per Week: 7 days   Minutes of Exercise per Session: 30 min  Stress: No Stress Concern Present   Feeling of Stress : Not at all  Social Connections: Moderately Integrated   Frequency of Communication with Friends and Family: More than three times a week   Frequency of Social Gatherings with Friends and Family: More than three times a week   Attends Religious Services: More than 4 times per year   Active Member of [employer] or Organizations: No   Attends Music therapist: Never   Marital Status: Married  Human resources officer Violence: Not At Risk   Fear of Current or Ex-Partner: No   Emotionally Abused: No   Physically Abused: No   Sexually Abused: No   Social History   Tobacco Use  Smoking Status Never  Smokeless Tobacco Never   Social History   Substance and Sexual Activity  Alcohol Use No   Alcohol/week: 0.0 standard drinks    Family History:  Family History  Problem Relation Age of Onset   Cancer Mother        ovarian   Dementia Father    Arthritis Sister    Heart disease Brother        MI   Stroke Paternal Grandmother    Cancer Paternal Grandfather        lung   Breast cancer Neg Hx     Past medical  history, surgical history, medications, allergies, family history and social history reviewed with patient today and changes made to appropriate areas of the chart.   ROS All other ROS negative except what is listed above and in the HPI.      Objective:    BP 113/71    Pulse 69    Temp 97.6 F (36.4 C) (Oral)    Ht '5\' 5"'  (1.651 m)    Wt 244 lb 3.2 oz (110.8 kg)    SpO2 96%    BMI 40.64 kg/m   Wt Readings from Last 3 Encounters:  07/30/21 244 lb 3.2 oz (110.8 kg)  04/24/21 240 lb 8.4 oz (109.1 kg)  01/26/21 240 lb 9.6 oz (109.1 kg)    Physical Exam Vitals and nursing note reviewed. Exam conducted with a chaperone present.  Constitutional:      General: She is awake. She is not in acute distress.    Appearance: She is well-developed and well-groomed. She is obese. She is not ill-appearing or toxic-appearing.  HENT:     Head: Normocephalic and atraumatic.     Right Ear: Hearing, tympanic membrane, ear canal and external ear normal. No drainage.     Left Ear: Hearing, tympanic membrane, ear canal and external ear normal. No drainage.     Nose: Nose normal.     Right Sinus: No maxillary sinus tenderness or frontal sinus tenderness.     Left Sinus: No maxillary sinus tenderness or frontal sinus tenderness.     Mouth/Throat:     Mouth: Mucous membranes are moist.     Pharynx: Oropharynx is clear. Uvula midline. No pharyngeal swelling, oropharyngeal exudate or posterior oropharyngeal erythema.  Eyes:     General: Lids are normal.        Right eye: No discharge.        Left eye: No discharge.     Extraocular Movements: Extraocular movements  intact.     Conjunctiva/sclera: Conjunctivae normal.     Pupils: Pupils are equal, round, and reactive to light.     Visual Fields: Right eye visual fields normal and left eye visual fields normal.  Neck:     Thyroid: No thyromegaly.     Vascular: No carotid bruit.     Trachea: Trachea normal.  Cardiovascular:     Rate and Rhythm: Normal rate  and regular rhythm.     Heart sounds: Normal heart sounds. No murmur heard.   No gallop.  Pulmonary:     Effort: Pulmonary effort is normal. No accessory muscle usage or respiratory distress.     Breath sounds: Normal breath sounds.  Abdominal:     General: Bowel sounds are normal.     Palpations: Abdomen is soft. There is no hepatomegaly or splenomegaly.     Tenderness: There is no abdominal tenderness.  Musculoskeletal:        General: Normal range of motion.     Cervical back: Normal range of motion and neck supple.     Right knee: Crepitus present. No swelling or erythema. Normal range of motion. Tenderness present over the patellar tendon. Normal pulse.     Instability Tests: Anterior drawer test negative. Posterior drawer test negative.     Left knee: Normal.     Right lower leg: No edema.     Left lower leg: No edema.  Lymphadenopathy:     Head:     Right side of head: No submental, submandibular, tonsillar, preauricular or posterior auricular adenopathy.     Left side of head: No submental, submandibular, tonsillar, preauricular or posterior auricular adenopathy.     Cervical: No cervical adenopathy.  Skin:    General: Skin is warm and dry.     Capillary Refill: Capillary refill takes less than 2 seconds.     Findings: No rash.  Neurological:     Mental Status: She is alert and oriented to person, place, and time.     Gait: Gait is intact.     Deep Tendon Reflexes: Reflexes are normal and symmetric.     Reflex Scores:      Brachioradialis reflexes are 2+ on the right side and 2+ on the left side.      Patellar reflexes are 2+ on the right side and 2+ on the left side. Psychiatric:        Attention and Perception: Attention normal.        Mood and Affect: Mood normal.        Speech: Speech normal.        Behavior: Behavior normal. Behavior is cooperative.        Thought Content: Thought content normal.        Judgment: Judgment normal.   Results for orders placed or  performed in visit on 08/29/20  Lipid Panel w/o Chol/HDL Ratio  Result Value Ref Range   Cholesterol, Total 209 (H) 100 - 199 mg/dL   Triglycerides 158 (H) 0 - 149 mg/dL   HDL 45 >39 mg/dL   VLDL Cholesterol Cal 28 5 - 40 mg/dL   LDL Chol Calc (NIH) 136 (H) 0 - 99 mg/dL  Comp Met (CMET)  Result Value Ref Range   Glucose 94 65 - 99 mg/dL   BUN 21 8 - 27 mg/dL   Creatinine, Ser 0.82 0.57 - 1.00 mg/dL   eGFR 78 >59 mL/min/1.73   BUN/Creatinine Ratio 26 12 - 28   Sodium 140  134 - 144 mmol/L   Potassium 4.9 3.5 - 5.2 mmol/L   Chloride 101 96 - 106 mmol/L   CO2 24 20 - 29 mmol/L   Calcium 10.3 8.7 - 10.3 mg/dL   Total Protein 7.1 6.0 - 8.5 g/dL   Albumin 4.4 3.8 - 4.8 g/dL   Globulin, Total 2.7 1.5 - 4.5 g/dL   Albumin/Globulin Ratio 1.6 1.2 - 2.2   Bilirubin Total 0.4 0.0 - 1.2 mg/dL   Alkaline Phosphatase 72 44 - 121 IU/L   AST 21 0 - 40 IU/L   ALT 17 0 - 32 IU/L      Assessment & Plan:   Problem List Items Addressed This Visit       Cardiovascular and Mediastinum   Aortic atherosclerosis ([place])    Noted on CT imaging 05/03/18 -- educated patient on this.  Recommend continue Baby ASA 81 MG daily and will check lipid panel today, consider addition of statin for prevention.  Non-smoker.      Relevant Orders   Comprehensive metabolic panel   Lipid Panel w/o Chol/HDL Ratio   TSH     Musculoskeletal and Integument   H/O compression fracture of spine    On 04/24/21, recommend she schedule DEXA repeat with mammogram in August, ordered today.  She agrees with this plan.  Continue collaboration with neurosurgery.      Relevant Orders   DG Bone Density   Osteoarthritis of both knees    Referral back to [facility] per request, as struggling with right knee at this time.      Relevant Medications   celecoxib (CELEBREX) 100 MG capsule   methocarbamol (ROBAXIN) 500 MG tablet     Other   BMI 40.0-44.9, adult (HCC)    Recommended eating smaller high protein, low fat meals  more frequently and exercising 30 mins a day 5 times a week with a goal of 10-15lb weight loss in the next 3 months. Patient voiced their understanding and motivation to adhere to these recommendations.       Relevant Orders   TSH   Chronic bilateral low back pain without sciatica    Chronic, with known Degenerative Disc. Continue collaboration with neurosurgery.  Recommend alternating heat and ice at home + scheduling Tylenol 1000 MG TID (max dose).  Avoid Ibuprofen unless needed.  Continue Gabapentin daily.  No red flag symptoms at this time.        Relevant Medications   celecoxib (CELEBREX) 100 MG capsule   methocarbamol (ROBAXIN) 500 MG tablet   Elevated LDL cholesterol level    With aortic atherosclerosis will check lipid panel today. Initiate statin as needed.      Relevant Orders   Comprehensive metabolic panel   Lipid Panel w/o Chol/HDL Ratio   Insomnia    Chronic, stable.  Recommend she cut back on Trazodone to 1/2 a pill in evening as needed and continue to cut back.  Do not take unless needed.  She wishes to reduce to discontinue this.      Menopausal vasomotor syndrome    Improved with Celexa 20 MG, this is max dose for age.  Continue current regimen and adjust as needed.  Would avoid HRT due to maternal history of ovarian cancer.  Refills sent in on Celexa.  Return in 6 months.      Obesity - Primary    BMI 40.64.  Recommended eating smaller high protein, low fat meals more frequently and exercising 30 mins a day 5  times a week with a goal of 10-15lb weight loss in the next 3 months. Patient voiced their understanding and motivation to adhere to these recommendations.       Relevant Orders   TSH   Restless legs syndrome (RLS)    Chronic and stable with Requip and Gabapentin.  Continue current regimen and adjust as needed.  Refills sent as needed.  Monitor kidney function closely with medications.  CBC, CMP, TSH, A1c on labs today.      Relevant Orders   CBC with  Differential/Platelet   Other Visit Diagnoses     Postmenopausal estrogen deficiency       DEXA ordered today   IFG (impaired fasting glucose)       A1c today, remains stable below prediabetic range.   Relevant Orders   Bayer DCA Hb A1c Waived   Microalbumin, Urine Waived   Vitamin D deficiency       History of low levels, check today and initiate supplement as needed.   Relevant Orders   VITAMIN D 25 Hydroxy (Vit-D Deficiency, Fractures)   Colon cancer screening       GI referral placed.   Relevant Orders   Ambulatory referral to Gastroenterology   Encounter for annual physical exam       Annual physical today with labs and health maintenane reviewed.        Follow up plan: Return in about 6 months (around 01/27/2022) for RLS and HLD.  LABORATORY TESTING:  - Pap smear: not applicable  IMMUNIZATIONS:   - Tdap: Tetanus vaccination status reviewed: last tetanus booster within 10 years. - Influenza: Up to date - Pneumovax: Up to date - Prevnar: Up to date - COVID: Up to date - HPV: Not applicable - Shingrix vaccine: Up to date  SCREENING: -Mammogram: Up to date  - Colonoscopy: Ordered today  - Bone Density: Up to date  -Hearing Test: Not applicable  -Spirometry: Not applicable   PATIENT COUNSELING:   Advised to take 1 mg of folate supplement per day if capable of pregnancy.   Sexuality: Discussed sexually transmitted diseases, partner selection, use of condoms, avoidance of unintended pregnancy  and contraceptive alternatives.   Advised to avoid cigarette smoking.  I discussed with the patient that most people either abstain from alcohol or drink within safe limits (<=14/week and <=4 drinks/occasion for males, <=7/weeks and <= 3 drinks/occasion for females) and that the risk for alcohol disorders and other health effects rises proportionally with the number of drinks per week and how often a drinker exceeds daily limits.  Discussed cessation/primary prevention of  drug use and availability of treatment for abuse.   Diet: Encouraged to adjust caloric intake to maintain  or achieve ideal body weight, to reduce intake of dietary saturated fat and total fat, to limit sodium intake by avoiding high sodium foods and not adding table salt, and to maintain adequate dietary potassium and calcium preferably from fresh fruits, vegetables, and low-fat dairy products.    Stressed the importance of regular exercise  Injury prevention: Discussed safety belts, safety helmets, smoke detector, smoking near bedding or upholstery.   Dental health: Discussed importance of regular tooth brushing, flossing, and dental visits.    NEXT PREVENTATIVE PHYSICAL DUE IN 1 YEAR. Return in about 6 months (around 01/27/2022) for RLS and HLD.

## 2021-07-30 NOTE — Telephone Encounter (Signed)
Refused because received at [facility]. #[id number] at 11:15 AM this morning #90, 4 refills.  Requested Prescriptions  Pending Prescriptions Disp Refills   citalopram (CELEXA) 20 MG tablet [Pharmacy Med Name: Citalopram Hydrobromide 20 MG Oral Tablet] 90 tablet 0    Sig: Take 1 tablet by mouth once daily     Psychiatry:  Antidepressants - SSRI Passed - 07/30/2021  7:19 AM      Passed - Valid encounter within last 6 months    Recent Outpatient Visits          Today Class 3 severe obesity due to excess calories without serious comorbidity with body mass index (BMI) of 40.0 to 44.9 in adult [facility])   [facility], Jolene T, NP   6 months ago Chronic bilateral low back pain without sciatica   [place] Cannady, Jolene T, NP   8 months ago BMI 40.0-44.9, adult ([place])   [place] Cannady, Jolene T, NP   9 months ago Cellulitis of left lower extremity   [place] Cannady, Jolene T, NP   11 months ago Aortic atherosclerosis ([place])   [place], Lauren A, NP      Future Appointments            In 6 months Cannady, Barbaraann Faster, NP [employer]

## 2021-07-30 NOTE — Assessment & Plan Note (Signed)
Noted on CT imaging 05/03/18 -- educated patient on this.  Recommend continue Baby ASA 81 MG daily and will check lipid panel today, consider addition of statin for prevention.  Non-smoker.

## 2021-07-30 NOTE — Assessment & Plan Note (Signed)
BMI 40.64.  Recommended eating smaller high protein, low fat meals more frequently and exercising 30 mins a day 5 times a week with a goal of 10-15lb weight loss in the next 3 months. Patient voiced their understanding and motivation to adhere to these recommendations.

## 2021-07-30 NOTE — Assessment & Plan Note (Signed)
Referral back to [facility] per request, as struggling with right knee at this time.

## 2021-07-30 NOTE — Assessment & Plan Note (Signed)
With aortic atherosclerosis will check lipid panel today. Initiate statin as needed.

## 2021-07-30 NOTE — Assessment & Plan Note (Signed)
Improved with Celexa 20 MG, this is max dose for age.  Continue current regimen and adjust as needed.  Would avoid HRT due to maternal history of ovarian cancer.  Refills sent in on Celexa.  Return in 6 months.

## 2021-07-30 NOTE — Progress Notes (Signed)
Gastroenterology Pre-Procedure Review  Request Date: 08/24/2021 Requesting Physician: Dr. Vicente Males  PATIENT REVIEW QUESTIONS: The patient responded to the following health history questions as indicated:    1. Are you having any GI issues? no 2. Do you have a personal history of Polyps? no 3. Do you have a family history of Colon Cancer or Polyps? no 4. Diabetes Mellitus? no 5. Joint replacements in the past 12 months?no 6. Major health problems in the past 3 months?no 7. Any artificial heart valves, MVP, or defibrillator?no    MEDICATIONS & ALLERGIES:    Patient reports the following regarding taking any anticoagulation/antiplatelet therapy:   Plavix, Coumadin, Eliquis, Xarelto, Lovenox, Pradaxa, Brilinta, or Effient? no Aspirin? yes (81 mg)  Patient confirms/reports the following medications:  Current Outpatient Medications  Medication Sig Dispense Refill   aspirin EC 81 MG tablet Take 81 mg by mouth daily. Swallow whole.     celecoxib (CELEBREX) 100 MG capsule Take 100 mg by mouth 2 (two) times daily.     citalopram (CELEXA) 20 MG tablet Take 1 tablet by mouth once daily 90 tablet 0   gabapentin (NEURONTIN) 300 MG capsule Take 1 capsule by mouth every night. 90 capsule 4   methocarbamol (ROBAXIN) 500 MG tablet Take 250-500 mg by mouth at bedtime as needed.     rOPINIRole (REQUIP) 1 MG tablet Take 1 tablet by mouth 4 times daily 360 tablet 1   traZODone (DESYREL) 50 MG tablet TAKE 1/2 TO 1 (ONE-HALF TO ONE) TABLET BY MOUTH AT BEDTIME AS NEEDED FOR SLEEP 90 tablet 0   No current facility-administered medications for this visit.    Patient confirms/reports the following allergies:  No Known Allergies  No orders of the defined types were placed in this encounter.   AUTHORIZATION INFORMATION Primary Insurance: 1D#: Group #:  Secondary Insurance: 1D#: Group #:  SCHEDULE INFORMATION: Date: 08/24/2021 Time: Location: [place]

## 2021-07-31 ENCOUNTER — Other Ambulatory Visit: Payer: Self-pay | Admitting: Nurse Practitioner

## 2021-07-31 DIAGNOSIS — M5416 Radiculopathy, lumbar region: Secondary | ICD-10-CM | POA: Diagnosis not present

## 2021-07-31 DIAGNOSIS — M5126 Other intervertebral disc displacement, lumbar region: Secondary | ICD-10-CM | POA: Diagnosis not present

## 2021-07-31 DIAGNOSIS — D729 Disorder of white blood cells, unspecified: Secondary | ICD-10-CM

## 2021-07-31 DIAGNOSIS — M545 Low back pain, unspecified: Secondary | ICD-10-CM | POA: Diagnosis not present

## 2021-07-31 DIAGNOSIS — M62830 Muscle spasm of back: Secondary | ICD-10-CM | POA: Diagnosis not present

## 2021-07-31 DIAGNOSIS — M5136 Other intervertebral disc degeneration, lumbar region: Secondary | ICD-10-CM | POA: Diagnosis not present

## 2021-07-31 LAB — CBC WITH DIFFERENTIAL/PLATELET
Basophils Absolute: 0.1 10*3/uL (ref 0.0–0.2)
Basos: 1 %
EOS (ABSOLUTE): 0.1 10*3/uL (ref 0.0–0.4)
Eos: 1 %
Hematocrit: 43.1 % (ref 34.0–46.6)
Hemoglobin: 14.2 g/dL (ref 11.1–15.9)
Immature Grans (Abs): 0 10*3/uL (ref 0.0–0.1)
Immature Granulocytes: 0 %
Lymphocytes Absolute: 6.3 10*3/uL — ABNORMAL HIGH (ref 0.7–3.1)
Lymphs: 54 %
MCH: 31 pg (ref 26.6–33.0)
MCHC: 32.9 g/dL (ref 31.5–35.7)
MCV: 94 fL (ref 79–97)
Monocytes Absolute: 0.8 10*3/uL (ref 0.1–0.9)
Monocytes: 7 %
Neutrophils Absolute: 4.2 10*3/uL (ref 1.4–7.0)
Neutrophils: 37 %
Platelets: 215 10*3/uL (ref 150–450)
RBC: 4.58 x10E6/uL (ref 3.77–5.28)
RDW: 12.4 % (ref 11.7–15.4)
WBC: 11.4 10*3/uL — ABNORMAL HIGH (ref 3.4–10.8)

## 2021-07-31 LAB — LIPID PANEL W/O CHOL/HDL RATIO
Cholesterol, Total: 219 mg/dL — ABNORMAL HIGH (ref 100–199)
HDL: 54 mg/dL (ref >39)
LDL Chol Calc (NIH): 132 mg/dL — ABNORMAL HIGH (ref 0–99)
Triglycerides: 189 mg/dL — ABNORMAL HIGH (ref 0–149)
VLDL Cholesterol Cal: 33 mg/dL (ref 5–40)

## 2021-07-31 LAB — COMPREHENSIVE METABOLIC PANEL
ALT: 19 IU/L (ref 0–32)
AST: 24 IU/L (ref 0–40)
Albumin/Globulin Ratio: 1.9 (ref 1.2–2.2)
Albumin: 4.3 g/dL (ref 3.8–4.8)
Alkaline Phosphatase: 81 IU/L (ref 44–121)
BUN/Creatinine Ratio: 22 (ref 12–28)
BUN: 20 mg/dL (ref 8–27)
Bilirubin Total: 0.4 mg/dL (ref 0.0–1.2)
CO2: 21 mmol/L (ref 20–29)
Calcium: 9.5 mg/dL (ref 8.7–10.3)
Chloride: 105 mmol/L (ref 96–106)
Creatinine, Ser: 0.91 mg/dL (ref 0.57–1.00)
Globulin, Total: 2.3 g/dL (ref 1.5–4.5)
Glucose: 88 mg/dL (ref 70–99)
Potassium: 4.5 mmol/L (ref 3.5–5.2)
Sodium: 140 mmol/L (ref 134–144)
Total Protein: 6.6 g/dL (ref 6.0–8.5)
eGFR: 68 mL/min/{1.73_m2} (ref >59)

## 2021-07-31 LAB — VITAMIN D 25 HYDROXY (VIT D DEFICIENCY, FRACTURES): Vit D, 25-Hydroxy: 29.6 ng/mL — ABNORMAL LOW (ref 30.0–100.0)

## 2021-07-31 LAB — TSH: TSH: 2.12 u[IU]/mL (ref 0.450–4.500)

## 2021-07-31 NOTE — Progress Notes (Signed)
Contacted via [place] -- needs lab only visit in 4 weeks please  The 10-year ASCVD risk score (Arnett DK, et al., 2019) is: 7%   Values used to calculate the score:     Age: 71 years     Sex: Female     Is Non-Hispanic African American: No     Diabetic: No     Tobacco smoker: No     Systolic Blood Pressure: 372 mmHg     Is BP treated: No     HDL Cholesterol: 54 mg/dL     Total Cholesterol: 219 mg/dL   Good afternoon Erika Anderson, your labs have returned: - CBC is showing some mild elevation in lymphocytes and white blood cells.  Have you been sick recently?  I would like you to schedule a lab only visit to recheck this in 4 weeks please. - Cholesterol labs are still elevated, but at this time continue to recommend diet and activity focus -- we will continue to monitor and start statin as needed in future. - Vitamin D remains a little low, please ensure you take Vitamin D3 2000 units daily. -- Kidney function, creatinine and eGFR, remains normal, as is liver function, AST and ALT.  Thyroid normal. Any questions? Keep being awesome!!  Thank you for allowing me to participate in your care.  I appreciate you. Kindest regards, Krisandra Bueno

## 2021-08-05 DIAGNOSIS — M62830 Muscle spasm of back: Secondary | ICD-10-CM | POA: Diagnosis not present

## 2021-08-05 DIAGNOSIS — M5126 Other intervertebral disc displacement, lumbar region: Secondary | ICD-10-CM | POA: Diagnosis not present

## 2021-08-05 DIAGNOSIS — M5136 Other intervertebral disc degeneration, lumbar region: Secondary | ICD-10-CM | POA: Diagnosis not present

## 2021-08-05 DIAGNOSIS — M545 Low back pain, unspecified: Secondary | ICD-10-CM | POA: Diagnosis not present

## 2021-08-05 DIAGNOSIS — M5416 Radiculopathy, lumbar region: Secondary | ICD-10-CM | POA: Diagnosis not present

## 2021-08-07 DIAGNOSIS — M545 Low back pain, unspecified: Secondary | ICD-10-CM | POA: Diagnosis not present

## 2021-08-07 DIAGNOSIS — M5136 Other intervertebral disc degeneration, lumbar region: Secondary | ICD-10-CM | POA: Diagnosis not present

## 2021-08-07 DIAGNOSIS — M62830 Muscle spasm of back: Secondary | ICD-10-CM | POA: Diagnosis not present

## 2021-08-07 DIAGNOSIS — M5126 Other intervertebral disc displacement, lumbar region: Secondary | ICD-10-CM | POA: Diagnosis not present

## 2021-08-07 DIAGNOSIS — M5416 Radiculopathy, lumbar region: Secondary | ICD-10-CM | POA: Diagnosis not present

## 2021-08-10 DIAGNOSIS — M5416 Radiculopathy, lumbar region: Secondary | ICD-10-CM | POA: Diagnosis not present

## 2021-08-10 DIAGNOSIS — M545 Low back pain, unspecified: Secondary | ICD-10-CM | POA: Diagnosis not present

## 2021-08-10 DIAGNOSIS — M5126 Other intervertebral disc displacement, lumbar region: Secondary | ICD-10-CM | POA: Diagnosis not present

## 2021-08-10 DIAGNOSIS — M62830 Muscle spasm of back: Secondary | ICD-10-CM | POA: Diagnosis not present

## 2021-08-10 DIAGNOSIS — M5136 Other intervertebral disc degeneration, lumbar region: Secondary | ICD-10-CM | POA: Diagnosis not present

## 2021-08-19 DIAGNOSIS — H2513 Age-related nuclear cataract, bilateral: Secondary | ICD-10-CM | POA: Diagnosis not present

## 2021-08-21 ENCOUNTER — Encounter: Payer: Self-pay | Admitting: Gastroenterology

## 2021-08-24 ENCOUNTER — Ambulatory Visit: Payer: Medicare HMO | Admitting: Registered Nurse

## 2021-08-24 ENCOUNTER — Other Ambulatory Visit: Payer: Self-pay

## 2021-08-24 ENCOUNTER — Encounter
Admission: RE | Disposition: A | Discharge status: Home / Self Care | Payer: Self-pay | Source: Home / Self Care | Attending: Gastroenterology

## 2021-08-24 ENCOUNTER — Ambulatory Visit
Admission: RE | Admit: 2021-08-24 | Discharge: 2021-08-24 | Disposition: A | Discharge status: Home / Self Care | Payer: Medicare HMO | Attending: Gastroenterology | Admitting: Gastroenterology

## 2021-08-24 ENCOUNTER — Encounter: Payer: Self-pay | Admitting: Gastroenterology

## 2021-08-24 DIAGNOSIS — K635 Polyp of colon: Secondary | ICD-10-CM | POA: Diagnosis not present

## 2021-08-24 DIAGNOSIS — Z1211 Encounter for screening for malignant neoplasm of colon: Secondary | ICD-10-CM

## 2021-08-24 DIAGNOSIS — M1990 Unspecified osteoarthritis, unspecified site: Secondary | ICD-10-CM | POA: Insufficient documentation

## 2021-08-24 DIAGNOSIS — K649 Unspecified hemorrhoids: Secondary | ICD-10-CM | POA: Diagnosis not present

## 2021-08-24 DIAGNOSIS — K640 First degree hemorrhoids: Secondary | ICD-10-CM | POA: Insufficient documentation

## 2021-08-24 HISTORY — PX: COLONOSCOPY WITH PROPOFOL: SHX5780

## 2021-08-24 SURGERY — COLONOSCOPY WITH PROPOFOL
Anesthesia: General

## 2021-08-24 MED ORDER — PROPOFOL 500 MG/50ML IV EMUL
INTRAVENOUS | Status: DC | PRN
  Administered 2021-08-24: 150 ug/kg/min via INTRAVENOUS

## 2021-08-24 MED ORDER — PROPOFOL 10 MG/ML IV BOLUS
INTRAVENOUS | Status: DC | PRN
  Administered 2021-08-24: 20 mg via INTRAVENOUS
  Administered 2021-08-24: 100 mg via INTRAVENOUS

## 2021-08-24 MED ORDER — SODIUM CHLORIDE 0.9 % IV SOLN: INTRAVENOUS | Status: DC

## 2021-08-24 MED ORDER — DEXMEDETOMIDINE (PRECEDEX) IN NS 20 MCG/5ML (4 MCG/ML) IV SYRINGE
PREFILLED_SYRINGE | INTRAVENOUS | Status: DC | PRN
Start: 2021-08-24 — End: 2021-08-24
  Administered 2021-08-24: 12 ug via INTRAVENOUS
  Administered 2021-08-24: 8 ug via INTRAVENOUS

## 2021-08-24 MED ORDER — LIDOCAINE HCL (CARDIAC) PF 100 MG/5ML IV SOSY
PREFILLED_SYRINGE | INTRAVENOUS | Status: DC | PRN
  Administered 2021-08-24: 40 mg via INTRAVENOUS

## 2021-08-24 MED ORDER — PROPOFOL 500 MG/50ML IV EMUL
INTRAVENOUS | Status: AC
  Filled 2021-08-24: qty 50

## 2021-08-24 NOTE — Op Note (Signed)
[facility] ?Gastroenterology ?Patient Name: Erika Anderson ?Procedure Date: 08/24/2021 10:28 AM ?MRN: [id number] ?Account #: [id number] ?Date of Birth: 03-24-1951 ?Admit Type: Outpatient ?Age: 71 ?Room: [facility] ENDO ROOM 4 ?Gender: Female ?Note Status: Finalized ?Instrument Name: Colonscope [id number] ?Procedure:             Colonoscopy ?Indications:           Screening for colorectal malignant neoplasm ?Providers:             Jonathon Bellows MD, MD ?Medicines:             Monitored Anesthesia Care ?Complications:         No immediate complications. ?Procedure:             Pre-Anesthesia Assessment: ?                       - Prior to the procedure, a History and Physical was  ?                       performed, and patient medications, allergies and  ?                       sensitivities were reviewed. The patient's tolerance  ?                       of previous anesthesia was reviewed. ?                       - The risks and benefits of the procedure and the  ?                       sedation options and risks were discussed with the  ?                       patient. All questions were answered and informed  ?                       consent was obtained. ?                       - ASA Grade Assessment: II - A patient with mild  ?                       systemic disease. ?                       After obtaining informed consent, the colonoscope was  ?                       passed under direct vision. Throughout the procedure,  ?                       the patient's blood pressure, pulse, and oxygen  ?                       saturations were monitored continuously. The  ?                       Colonoscope was introduced through the anus and  ?  advanced to the the cecum, identified by the  ?                       appendiceal orifice. The colonoscopy was performed  ?                       with ease. The patient tolerated the procedure well.  ?                       The quality of the bowel  preparation was excellent. ?Findings: ?     The perianal and digital rectal examinations were normal. ?     A 5 mm polyp was found in the ascending colon. The polyp was sessile.  ?     The polyp was removed with a cold snare. Resection and retrieval were  ?     complete. ?     Non-bleeding internal hemorrhoids were found during retroflexion. The  ?     hemorrhoids were large and Grade I (internal hemorrhoids that do not  ?     prolapse). ?     The exam was otherwise without abnormality on direct and retroflexion  ?     views. ?Impression:            - One 5 mm polyp in the ascending colon, removed with  ?                       a cold snare. Resected and retrieved. ?                       - Non-bleeding internal hemorrhoids. ?                       - The examination was otherwise normal on direct and  ?                       retroflexion views. ?Recommendation:        - Discharge patient to home (with escort). ?                       - Resume previous diet. ?                       - Continue present medications. ?                       - Await pathology results. ?                       - Repeat colonoscopy for surveillance based on  ?                       pathology results. ?Procedure Code(s):     --- Professional --- ?                       [phone], Colonoscopy, flexible; with removal of  ?                       tumor(s), polyp(s), or other lesion(s) by snare  ?  technique ?Diagnosis Code(s):     --- Professional --- ?                       Z12.11, Encounter for screening for malignant neoplasm  ?                       of colon ?                       K63.5, Polyp of colon ?                       K64.0, First degree hemorrhoids ?CPT copyright 2019 American Medical Association. All rights reserved. ?The codes documented in this report are preliminary and upon coder review may  ?be revised to meet current compliance requirements. ?Jonathon Bellows, MD ?Jonathon Bellows MD, MD ?08/24/2021 10:56:02 AM ?This  report has been signed electronically. ?Number of Addenda: 0 ?Note Initiated On: 08/24/2021 10:28 AM ?Scope Withdrawal Time: 0 hours 10 minutes 6 seconds  ?Total Procedure Duration: 0 hours 13 minutes 43 seconds  ?Estimated Blood Loss:  Estimated blood loss: none. ?     [facility] ?

## 2021-08-24 NOTE — Anesthesia Postprocedure Evaluation (Signed)
Anesthesia Post Note ? ?Patient: Latysha Thackston [facility] ? ?Procedure(s) Performed: COLONOSCOPY WITH PROPOFOL ? ?Patient location during evaluation: Endoscopy ?Anesthesia Type: General ?Level of consciousness: awake and alert ?Pain management: pain level controlled ?Vital Signs Assessment: post-procedure vital signs reviewed and stable ?Respiratory status: spontaneous breathing, nonlabored ventilation, respiratory function stable and patient connected to nasal cannula oxygen ?Cardiovascular status: blood pressure returned to baseline and stable ?Postop Assessment: no apparent nausea or vomiting ?Anesthetic complications: no ? ? ?No notable events documented. ? ? ?Last Vitals:  ?Vitals:  ? 08/24/21 1112 08/24/21 1118  ?BP: 105/61 (!) 109/59  ?Pulse: (!) 54 (!) 54  ?Resp: 11 12  ?Temp:    ?SpO2: 94% 95%  ?  ?Last Pain:  ?Vitals:  ? 08/24/21 1118  ?TempSrc:   ?PainSc: 0-No pain  ? ? ?  ?  ?  ?  ?  ?  ? ?Precious Haws Analuisa Tudor ? ? ? ? ?

## 2021-08-24 NOTE — Anesthesia Preprocedure Evaluation (Signed)
Anesthesia Evaluation  ?Patient identified by MRN, date of birth, ID band ?Patient awake ? ? ? ?Reviewed: ?Allergy & Precautions, NPO status , Patient's Chart, lab work & pertinent test results ? ?History of Anesthesia Complications ?Negative for: history of anesthetic complications ? ?Airway ?Mallampati: III ? ?TM Distance: <3 FB ?Neck ROM: full ? ? ? Dental ? ?(+) Chipped ?  ?Pulmonary ?neg pulmonary ROS, neg shortness of breath,  ?  ?Pulmonary exam normal ? ? ? ? ? ? ? Cardiovascular ?Exercise Tolerance: Good ?(-) anginanegative cardio ROS ?Normal cardiovascular exam ? ? ?  ?Neuro/Psych ?negative neurological ROS ? negative psych ROS  ? GI/Hepatic ?negative GI ROS, Neg liver ROS, neg GERD  ,  ?Endo/Other  ?negative endocrine ROS ? Renal/GU ?negative Renal ROS  ?negative genitourinary ?  ?Musculoskeletal ? ?(+) Arthritis ,  ? Abdominal ?  ?Peds ? Hematology ?negative hematology ROS ?(+)   ?Anesthesia Other Findings ?Past Medical History: ?No date: Hyperlipidemia ?No date: Obesity (BMI 30-39.9) ?12/28/2014: Restless legs syndrome (RLS) ?No date: RLS (restless legs syndrome) ? ?Past Surgical History: ?No date: HEMORROIDECTOMY ?No date: TOTAL ABDOMINAL HYSTERECTOMY ?    Comment:  ovaries remain ? ?BMI   ? Body Mass Index: 41.10 kg/m?  ?  ? ? Reproductive/Obstetrics ?negative OB ROS ? ?  ? ? ? ? ? ? ? ? ? ? ? ? ? ?  ?  ? ? ? ? ? ? ? ? ?Anesthesia Physical ?Anesthesia Plan ? ?ASA: 2 ? ?Anesthesia Plan: General  ? ?Post-op Pain Management:   ? ?Induction: Intravenous ? ?PONV Risk Score and Plan: Propofol infusion and TIVA ? ?Airway Management Planned: Natural Airway and Nasal Cannula ? ?Additional Equipment:  ? ?Intra-op Plan:  ? ?Post-operative Plan:  ? ?Informed Consent: I have reviewed the patients History and Physical, chart, labs and discussed the procedure including the risks, benefits and alternatives for the proposed anesthesia with the patient or authorized representative who  has indicated his/her understanding and acceptance.  ? ? ? ?Dental Advisory Given ? ?Plan Discussed with: Anesthesiologist, CRNA and Surgeon ? ?Anesthesia Plan Comments: (Patient consented for risks of anesthesia including but not limited to:  ?- adverse reactions to medications ?- risk of airway placement if required ?- damage to eyes, teeth, lips or other oral mucosa ?- nerve damage due to positioning  ?- sore throat or hoarseness ?- Damage to heart, brain, nerves, lungs, other parts of body or loss of life ? ?Patient voiced understanding.)  ? ? ? ? ? ? ?Anesthesia Quick Evaluation ? ?

## 2021-08-24 NOTE — H&P (Signed)
? ? ? ?Erika Bellows, MD ?[address], [place], [place], [place], [address] ?[address] [place], [place], [place], [address] ?Phone: [phone]  ?Fax: [phone] ? ?Primary Care Physician:  Venita Lick, NP ? ? ?Pre-Procedure History & Physical: ?HPI:  Erika Anderson is a 71 y.o. female is here for an colonoscopy. ?  ?Past Medical History:  ?Diagnosis Date  ? Hyperlipidemia   ? Obesity (BMI 30-39.9)   ? Restless legs syndrome (RLS) 12/28/2014  ? RLS (restless legs syndrome)   ? ? ?Past Surgical History:  ?Procedure Laterality Date  ? HEMORROIDECTOMY    ? TOTAL ABDOMINAL HYSTERECTOMY    ? ovaries remain  ? ? ?Prior to Admission medications   ?Medication Sig Start Date End Date Taking? Authorizing Provider  ?aspirin EC 81 MG tablet Take 81 mg by mouth daily. Swallow whole.   Yes [provider]  ?celecoxib (CELEBREX) 100 MG capsule Take 100 mg by mouth 2 (two) times daily. 07/21/21  Yes [provider]  ?citalopram (CELEXA) 20 MG tablet Take 1 tablet (20 mg total) by mouth daily. 07/30/21  Yes Marnee Guarneri T, NP  ?gabapentin (NEURONTIN) 300 MG capsule Take 1 capsule by mouth every night. 07/30/21  Yes Cannady, Jolene T, NP  ?methocarbamol (ROBAXIN) 500 MG tablet Take 250-500 mg by mouth at bedtime as needed. 07/14/21  Yes [provider]  ?rOPINIRole (REQUIP) 1 MG tablet Take 1 tablet (1 mg total) by mouth 4 (four) times daily. 07/30/21  Yes Cannady, Jolene T, NP  ?traZODone (DESYREL) 50 MG tablet TAKE 1/2 TO 1 (ONE-HALF TO ONE) TABLET BY MOUTH AT BEDTIME AS NEEDED FOR SLEEP 06/26/21  Yes Venita Lick, NP  ? ? ?Allergies as of 07/30/2021  ? (No Known Allergies)  ? ? ?Family History  ?Problem Relation Age of Onset  ? Cancer Mother   ?     ovarian  ? Dementia Father   ? Arthritis Sister   ? Heart disease Brother   ?     MI  ? Stroke Paternal Grandmother   ? Cancer Paternal Grandfather   ?     lung  ? Breast cancer Neg Hx   ? ? ?Social History  ? ?Socioeconomic History  ?  Marital status: Married  ?  Spouse name: Not on file  ? Number of children: Not on file  ? Years of education: 75  ? Highest education level: High school graduate  ?Occupational History  ? Occupation: retired  ?Tobacco Use  ? Smoking status: Never  ? Smokeless tobacco: Never  ?Vaping Use  ? Vaping Use: Never used  ?Substance and Sexual Activity  ? Alcohol use: No  ?  Alcohol/week: 0.0 standard drinks  ? Drug use: No  ? Sexual activity: Not Currently  ?Other Topics Concern  ? Not on file  ?Social History Narrative  ? Helps take care of grandchildren   ? ?Social Determinants of Health  ? ?Financial Resource Strain: Low Risk   ? Difficulty of Paying Living Expenses: Not hard at all  ?Food Insecurity: No Food Insecurity  ? Worried About Charity fundraiser in the Last Year: Never true  ? Ran Out of Food in the Last Year: Never true  ?Transportation Needs: No Transportation Needs  ? Lack of Transportation (Medical): No  ? Lack of Transportation (Non-Medical): No  ?Physical Activity: Sufficiently Active  ? Days of Exercise per Week: 7 days  ? Minutes of Exercise per Session: 30 min  ?  Stress: No Stress Concern Present  ? Feeling of Stress : Not at all  ?Social Connections: Moderately Integrated  ? Frequency of Communication with Friends and Family: More than three times a week  ? Frequency of Social Gatherings with Friends and Family: More than three times a week  ? Attends Religious Services: More than 4 times per year  ? Active Member of Clubs or Organizations: No  ? Attends Archivist Meetings: Never  ? Marital Status: Married  ?Intimate Partner Violence: Not At Risk  ? Fear of Current or Ex-Partner: No  ? Emotionally Abused: No  ? Physically Abused: No  ? Sexually Abused: No  ? ? ?Review of Systems: ?See HPI, otherwise negative ROS ? ?Physical Exam: ?BP (!) 141/73   Pulse 66   Temp (!) 97.5 ?F (36.4 ?C) (Temporal)   Resp 19   Ht '5\' 5"'$  (1.651 m)   Wt 112 kg   SpO2 98%   BMI 41.10 kg/m?  ?General:    Alert,  pleasant and cooperative in NAD ?Head:  Normocephalic and atraumatic. ?Neck:  Supple; no masses or thyromegaly. ?Lungs:  Clear throughout to auscultation, normal respiratory effort.    ?Heart:  +S1, +S2, Regular rate and rhythm, No edema. ?Abdomen:  Soft, nontender and nondistended. Normal bowel sounds, without guarding, and without rebound.   ?Neurologic:  Alert and  oriented x4;  grossly normal neurologically. ? ?Impression/Plan: ?Erika Anderson is here for an colonoscopy to be performed for Screening colonoscopy average risk   ?Risks, benefits, limitations, and alternatives regarding  colonoscopy have been reviewed with the patient.  Questions have been answered.  All parties agreeable. ? ? ?Erika Bellows, MD  08/24/2021, 10:28 AM ? ?

## 2021-08-24 NOTE — Transfer of Care (Signed)
Immediate Anesthesia Transfer of Care Note ? ?Patient: Erika Anderson [facility] ? ?Procedure(s) Performed: Procedure(s): ?COLONOSCOPY WITH PROPOFOL (N/A) ? ?Patient Location: PACU and Endoscopy Unit ? ?Anesthesia Type:General ? ?Level of Consciousness: sedated ? ?Airway & Oxygen Therapy: Patient Spontanous Breathing and Patient connected to nasal cannula oxygen ? ?Post-op Assessment: Report given to RN and Post -op Vital signs reviewed and stable ? ?Post vital signs: Reviewed and stable ? ?Last Vitals:  ?Vitals:  ? 08/24/21 0949 08/24/21 1058  ?BP: (!) 141/73 111/86  ?Pulse: 66 (!) 57  ?Resp: 19 12  ?Temp: (!) 36.4 ?C (!) 35.7 ?C  ?SpO2: 98% 94%  ? ? ?Complications: No apparent anesthesia complications ?

## 2021-08-25 ENCOUNTER — Encounter: Payer: Self-pay | Admitting: Gastroenterology

## 2021-08-25 LAB — SURGICAL PATHOLOGY

## 2021-08-27 ENCOUNTER — Encounter: Payer: Self-pay | Admitting: Gastroenterology

## 2021-08-27 NOTE — Progress Notes (Signed)
Ok

## 2021-08-28 ENCOUNTER — Other Ambulatory Visit: Payer: Medicare HMO

## 2021-08-28 ENCOUNTER — Other Ambulatory Visit: Payer: Self-pay

## 2021-08-28 DIAGNOSIS — D729 Disorder of white blood cells, unspecified: Secondary | ICD-10-CM | POA: Diagnosis not present

## 2021-08-29 ENCOUNTER — Encounter: Payer: Self-pay | Admitting: Nurse Practitioner

## 2021-08-29 ENCOUNTER — Other Ambulatory Visit: Payer: Self-pay | Admitting: Nurse Practitioner

## 2021-08-29 DIAGNOSIS — D729 Disorder of white blood cells, unspecified: Secondary | ICD-10-CM

## 2021-08-29 LAB — CBC WITH DIFFERENTIAL/PLATELET
Basophils Absolute: 0.1 10*3/uL (ref 0.0–0.2)
Basos: 1 %
EOS (ABSOLUTE): 0.1 10*3/uL (ref 0.0–0.4)
Eos: 1 %
Hematocrit: 41.6 % (ref 34.0–46.6)
Hemoglobin: 13.9 g/dL (ref 11.1–15.9)
Immature Grans (Abs): 0 10*3/uL (ref 0.0–0.1)
Immature Granulocytes: 0 %
Lymphocytes Absolute: 6.1 10*3/uL — ABNORMAL HIGH (ref 0.7–3.1)
Lymphs: 56 %
MCH: 30.4 pg (ref 26.6–33.0)
MCHC: 33.4 g/dL (ref 31.5–35.7)
MCV: 91 fL (ref 79–97)
Monocytes Absolute: 0.7 10*3/uL (ref 0.1–0.9)
Monocytes: 6 %
Neutrophils Absolute: 3.9 10*3/uL (ref 1.4–7.0)
Neutrophils: 36 %
Platelets: 180 10*3/uL (ref 150–450)
RBC: 4.57 x10E6/uL (ref 3.77–5.28)
RDW: 12.4 % (ref 11.7–15.4)
WBC: 10.9 10*3/uL — ABNORMAL HIGH (ref 3.4–10.8)

## 2021-08-29 NOTE — Progress Notes (Signed)
Contacted via MyChart -- need 4 week lab only visit please ? ? ?Good evening Erika Anderson, your labs have come back.  White blood cell count is trending down, now only mildly elevated.  Lymphocytes remain a little elevated.  No recent illnesses, correct?  Any recent Prednisone use? It would be good to check this one more time in 4 weeks to ensure these continue to improve.  I will have them call to schedule a lab only visit.  Any questions? ?Keep being stellar!!  Thank you for allowing me to participate in your care.  I appreciate you. ?Kindest regards, ?Verlie Hellenbrand ?

## 2021-08-30 ENCOUNTER — Other Ambulatory Visit: Payer: Self-pay | Admitting: Nurse Practitioner

## 2021-09-25 ENCOUNTER — Ambulatory Visit (INDEPENDENT_AMBULATORY_CARE_PROVIDER_SITE_OTHER): Payer: Medicare HMO | Admitting: Nurse Practitioner

## 2021-09-25 ENCOUNTER — Other Ambulatory Visit: Payer: Self-pay | Admitting: Nurse Practitioner

## 2021-09-25 ENCOUNTER — Encounter: Payer: Self-pay | Admitting: Nurse Practitioner

## 2021-09-25 VITALS — BP 116/74 | HR 128 | Temp 97.9°F | Ht 65.0 in | Wt 240.4 lb

## 2021-09-25 DIAGNOSIS — Z1231 Encounter for screening mammogram for malignant neoplasm of breast: Secondary | ICD-10-CM

## 2021-09-25 DIAGNOSIS — E782 Mixed hyperlipidemia: Secondary | ICD-10-CM

## 2021-09-25 DIAGNOSIS — I499 Cardiac arrhythmia, unspecified: Secondary | ICD-10-CM

## 2021-09-25 DIAGNOSIS — R8281 Pyuria: Secondary | ICD-10-CM

## 2021-09-25 DIAGNOSIS — D729 Disorder of white blood cells, unspecified: Secondary | ICD-10-CM | POA: Diagnosis not present

## 2021-09-25 DIAGNOSIS — N9089 Other specified noninflammatory disorders of vulva and perineum: Secondary | ICD-10-CM | POA: Diagnosis not present

## 2021-09-25 DIAGNOSIS — I4892 Unspecified atrial flutter: Secondary | ICD-10-CM | POA: Insufficient documentation

## 2021-09-25 LAB — MICROSCOPIC EXAMINATION

## 2021-09-25 LAB — URINALYSIS, ROUTINE W REFLEX MICROSCOPIC
Bilirubin, UA: NEGATIVE
Glucose, UA: NEGATIVE
Nitrite, UA: NEGATIVE
Specific Gravity, UA: >1.03 — ABNORMAL HIGH (ref 1.005–1.030)
Urobilinogen, Ur: 0.2 mg/dL (ref 0.2–1.0)
pH, UA: 5.5 (ref 5.0–7.5)

## 2021-09-25 MED ORDER — ROSUVASTATIN CALCIUM 10 MG PO TABS: 10.0000 mg | ORAL_TABLET | Freq: Every day | ORAL | 3 refills | Status: DC

## 2021-09-25 MED ORDER — METOPROLOL TARTRATE 25 MG PO TABS: 25.0000 mg | ORAL_TABLET | Freq: Two times a day (BID) | ORAL | 3 refills | Status: DC

## 2021-09-25 MED ORDER — APIXABAN 5 MG PO TABS: 5.0000 mg | ORAL_TABLET | Freq: Two times a day (BID) | ORAL | 4 refills | Status: DC

## 2021-09-25 NOTE — Patient Instructions (Signed)
Atrial Fibrillation  Atrial fibrillation is a type of heartbeat that is irregular or fast. If you have this condition, your heart beats without any order. This makes it hard for your heart to pump blood in a normal way. Atrial fibrillation may come and go, or it may become a long-lasting problem. If this condition is not treated, it can put you at higher risk for stroke, heart failure, and other heart problems. What are the causes? This condition may be caused by diseases that damage the heart. They include: High blood pressure. Heart failure. Heart valve disease. Heart surgery. Other causes include: Diabetes. Thyroid disease. Being overweight. Kidney disease. Sometimes the cause is not known. What increases the risk? You are more likely to develop this condition if: You are older. You smoke. You exercise often and very hard. You have a family history of this condition. You are a man. You use drugs. You drink a lot of alcohol. You have lung conditions, such as emphysema, pneumonia, or COPD. You have sleep apnea. What are the signs or symptoms? Common symptoms of this condition include: A feeling that your heart is beating very fast. Chest pain or discomfort. Feeling short of breath. Suddenly feeling light-headed or weak. Getting tired easily during activity. Fainting. Sweating. In some cases, there are no symptoms. How is this treated? Treatment for this condition depends on underlying conditions and how you feel when you have atrial fibrillation. They include: Medicines to: Prevent blood clots. Treat heart rate or heart rhythm problems. Using devices, such as a pacemaker, to correct heart rhythm problems. Doing surgery to remove the part of the heart that sends bad signals. Closing an area where clots can form in the heart (left atrial appendage). In some cases, your doctor will treat other underlying conditions. Follow these instructions at home: Medicines Take  over-the-counter and prescription medicines only as told by your doctor. Do not take any new medicines without first talking to your doctor. If you are taking blood thinners: Talk with your doctor before you take any medicines that have aspirin or NSAIDs, such as ibuprofen, in them. Take your medicine exactly as told by your doctor. Take it at the same time each day. Avoid activities that could hurt or bruise you. Follow instructions about how to prevent falls. Wear a bracelet that says you are taking blood thinners. Or, carry a card that lists what medicines you take. Lifestyle     Do not use any products that have nicotine or tobacco in them. These include cigarettes, e-cigarettes, and chewing tobacco. If you need help quitting, ask your doctor. Eat heart-healthy foods. Talk with your doctor about the right eating plan for you. Exercise regularly as told by your doctor. Do not drink alcohol. Lose weight if you are overweight. Do not use drugs, including cannabis. General instructions If you have a condition that causes breathing to stop for a short period of time (apnea), treat it as told by your doctor. Keep a healthy weight. Do not use diet pills unless your doctor says they are safe for you. Diet pills may make heart problems worse. Keep all follow-up visits as told by your doctor. This is important. Contact a doctor if: You notice a change in the speed, rhythm, or strength of your heartbeat. You are taking a blood-thinning medicine and you get more bruising. You get tired more easily when you move or exercise. You have a sudden change in weight. Get help right away if:  You have pain in   your chest or your belly (abdomen). You have trouble breathing. You have side effects of blood thinners, such as blood in your vomit, poop (stool), or pee (urine), or bleeding that cannot stop. You have any signs of a stroke. "BE FAST" is an easy way to remember the main warning signs: B -  Balance. Signs are dizziness, sudden trouble walking, or loss of balance. E - Eyes. Signs are trouble seeing or a change in how you see. F - Face. Signs are sudden weakness or loss of feeling in the face, or the face or eyelid drooping on one side. A - Arms. Signs are weakness or loss of feeling in an arm. This happens suddenly and usually on one side of the body. S - Speech. Signs are sudden trouble speaking, slurred speech, or trouble understanding what people say. T - Time. Time to call emergency services. Write down what time symptoms started. You have other signs of a stroke, such as: A sudden, very bad headache with no known cause. Feeling like you may vomit (nausea). Vomiting. A seizure. These symptoms may be an emergency. Do not wait to see if the symptoms will go away. Get medical help right away. Call your local emergency services (911 in the [place] Do not drive yourself to the hospital. Summary Atrial fibrillation is a type of heartbeat that is irregular or fast. You are at higher risk of this condition if you smoke, are older, have diabetes, or are overweight. Follow your doctor's instructions about medicines, diet, exercise, and follow-up visits. Get help right away if you have signs or symptoms of a stroke. Get help right away if you cannot catch your breath, or you have chest pain or discomfort. This information is not intended to replace advice given to you by your health care provider. Make sure you discuss any questions you have with your health care provider. Document Revised: 11/15/2018 Document Reviewed: 11/15/2018 Elsevier Patient Education  2023 Elsevier Inc.  

## 2021-09-25 NOTE — Assessment & Plan Note (Signed)
For weeks present, skin tag in appearance.  Low suspicion for HSV.  Will get into dermatology for full skin exam and assessment of area + removal if indicated. ?

## 2021-09-25 NOTE — Assessment & Plan Note (Signed)
Chronic, ongoing.  Due to risk factors and new onset irregular HR will start Rosuvastatin 10 MG daily, discussed with patient and educated her on this.  Lipid panel next visit. ?

## 2021-09-25 NOTE — Assessment & Plan Note (Addendum)
Noted on exam with auscultation today, HR tachycardic and irregular.  On EKG abnormal findings, atrial flutter in appearance + does endorse symptoms being present.  ChadVASc - 2.  At this time start Eliquis 5 MG BID and Metoprolol Tartrate 25 MG BID + urgent referral to cardiology.  Educated patient at length on findings, discussed with her will start out with current regimen, but this may be altered by cardiology after their exam. Echo ordered. Return in 2 weeks. ?

## 2021-09-25 NOTE — Progress Notes (Signed)
? ?BP 116/74   Pulse (!) 128   Temp 97.9 ?F (36.6 ?C) (Oral)   Ht _0  (1.651 m)   Wt 240 lb 6.4 oz (109 kg)   SpO2 92%   BMI 40.00 kg/m?   ? ?Subjective:  ? ? Patient ID: Erika Anderson, female    DOB: 02-29-1951, 71 y.o.   MRN: [id number] ? ?HPI: ?Erika Anderson is a 71 y.o. female ? ?Chief Complaint  ?Patient presents with  ? Vagina Spot  ?  Patient states she has an area on her bottom "vagina" area that she has had for several months. Patient states her husband says it looks like a mole, but patient states she did not think a mole would be tender at time. Patient states she thought it was an ingrown at first, but the soreness has her concerned. Patient says it increases in size a little bit.   ? ?SKIN LESION ?To lower vaginal area left side, present for a few months.  She is unsure when it happened, could have been when shaved to go swimming.  Tender when anything touches it and occasionally swells some.   ? ?No drainage from area.  One time she tried to "pop" it and it did bleed.   ?Duration: months ?Location: left lower vaginal area ?Painful:  occasional with wiping ?Itching: no ?Onset: sudden ?Context:  occasionally swells a little ?Associated signs and symptoms: as above ?History of skin cancer:  years ago has something on chest removed ?History of precancerous skin lesions: no ?Family history of skin cancer: no  ? ?IRREGULAR HEARTBEAT ?Noted on exam today.  After discussing with patient she does endorse over past months noticing some shortness of breath with exertion on occasions, especially with gardening or outside work.  Does endorse having intermittent palpitation feelings and chest discomfort.  No orthopnea. ?Aspirin: yes ?Recurrent headaches: no ?Visual changes: no ?Palpitations: yes ?Dyspnea: yes ?Chest pain:  occasional dicomfort ?Lower extremity edema: no ?Dizzy/lightheaded: no  ? ?CHADSVASC = 2 (low-moderate risk) points for age and gender ? ?Relevant past medical, surgical, family  and social history reviewed and updated as indicated. Interim medical history since our last visit reviewed. ?Allergies and medications reviewed and updated. ? ?Review of Systems  ?Constitutional:  Negative for activity change, appetite change, diaphoresis, fatigue and fever.  ?Respiratory:  Positive for shortness of breath. Negative for cough, chest tightness and wheezing.   ?Cardiovascular:  Positive for chest pain and palpitations. Negative for leg swelling.  ?Gastrointestinal: Negative.   ?Endocrine: Negative.   ?Neurological: Negative.   ?Psychiatric/Behavioral: Negative.    ? ?Per HPI unless specifically indicated above ? ?   ?Objective:  ?  ?BP 116/74   Pulse (!) 128   Temp 97.9 ?F (36.6 ?C) (Oral)   Ht _1  (1.651 m)   Wt 240 lb 6.4 oz (109 kg)   SpO2 92%   BMI 40.00 kg/m?   ?Wt Readings from Last 3 Encounters:  ?09/25/21 240 lb 6.4 oz (109 kg)  ?08/24/21 247 lb (112 kg)  ?07/30/21 244 lb 3.2 oz (110.8 kg)  ?  ?Physical Exam ?Vitals and nursing note reviewed. Exam conducted with a chaperone present.  ?Constitutional:   ?   General: She is awake. She is not in acute distress. ?   Appearance: She is well-developed and well-groomed. She is obese. She is not ill-appearing or toxic-appearing.  ?HENT:  ?   Head: Normocephalic.  ?   Right Ear: Hearing normal.  ?  Left Ear: Hearing normal.  ?Eyes:  ?   General: Lids are normal.     ?   Right eye: No discharge.     ?   Left eye: No discharge.  ?   Conjunctiva/sclera: Conjunctivae normal.  ?   Pupils: Pupils are equal, round, and reactive to light.  ?Neck:  ?   Thyroid: No thyromegaly.  ?   Vascular: No carotid bruit.  ?Cardiovascular:  ?   Rate and Rhythm: Tachycardia present. Rhythm irregularly irregular.  ?   Heart sounds: Normal heart sounds. No murmur heard. ?  No gallop. No S3 or S4 sounds.  ?Pulmonary:  ?   Effort: Pulmonary effort is normal. No accessory muscle usage or respiratory distress.  ?   Breath sounds: Normal breath sounds.  ?Abdominal:  ?    General: Bowel sounds are normal. There is no distension.  ?   Palpations: Abdomen is soft.  ?   Tenderness: There is no abdominal tenderness.  ?Genitourinary: ?   Exam position: Lithotomy position.  ?   Labia:     ?   Right: No rash, tenderness, lesion or injury.     ?   Left: Lesion present. No rash, tenderness or injury.   ? ? ?Musculoskeletal:  ?   Cervical back: Normal range of motion and neck supple.  ?   Right lower leg: No edema.  ?   Left lower leg: No edema.  ?Lymphadenopathy:  ?   Cervical: No cervical adenopathy.  ?Skin: ?   General: Skin is warm and dry.  ?Neurological:  ?   Mental Status: She is alert and oriented to person, place, and time.  ?Psychiatric:     ?   Attention and Perception: Attention normal.     ?   Mood and Affect: Mood normal.     ?   Speech: Speech normal.     ?   Behavior: Behavior normal. Behavior is cooperative.     ?   Thought Content: Thought content normal.  ? ?EKG ?My review and personal interpretation at Time: 1620  ?Indication: irregular HR  Rate: 134  Rhythm: tachycardia, atrial flutter Axis: normal Other: no nonspecific st abn, no stemi, no lvh ? ?Results for orders placed or performed in visit on 09/25/21  ?Microscopic Examination  ? Urine  ?Result Value Ref Range  ? WBC, UA 0-5 0 - 5 /hpf  ? RBC 0-2 0 - 2 /hpf  ? Epithelial Cells (non renal) 0-10 0 - 10 /hpf  ? Casts Present (A) None seen /lpf  ? Cast Type Hyaline casts N/A  ? Mucus, UA Present (A) Not Estab.  ? Bacteria, UA Moderate (A) None seen/Few  ?Urinalysis, Routine w reflex microscopic  ?Result Value Ref Range  ? Specific Gravity, UA >1.030 (H) 1.005 - 1.030  ? pH, UA 5.5 5.0 - 7.5  ? Color, UA Yellow Yellow  ? Appearance Ur Cloudy (A) Clear  ? Leukocytes,UA Trace (A) Negative  ? Protein,UA Trace (A) Negative/Trace  ? Glucose, UA Negative Negative  ? Ketones, UA Trace (A) Negative  ? RBC, UA Trace (A) Negative  ? Bilirubin, UA Negative Negative  ? Urobilinogen, Ur 0.2 0.2 - 1.0 mg/dL  ? Nitrite, UA Negative  Negative  ? Microscopic Examination See below:   ? ?   ?Assessment & Plan:  ? ?Problem List Items Addressed This Visit   ? ?  ? Genitourinary  ? Lesion of labia  ?  For weeks present,  skin tag in appearance.  Low suspicion for HSV.  Will get into dermatology for full skin exam and assessment of area + removal if indicated. ? ?  ?  ? Relevant Orders  ? Ambulatory referral to Dermatology  ?  ? Other  ? Hyperlipidemia  ?  Chronic, ongoing.  Due to risk factors and new onset irregular HR will start Rosuvastatin 10 MG daily, discussed with patient and educated her on this.  Lipid panel next visit. ? ?  ?  ? Relevant Medications  ? metoprolol tartrate (LOPRESSOR) 25 MG tablet  ? apixaban (ELIQUIS) 5 MG TABS tablet  ? rosuvastatin (CRESTOR) 10 MG tablet  ? Irregular heartbeat - Primary  ?  Noted on exam with auscultation today, HR tachycardic and irregular.  On EKG abnormal findings, atrial flutter in appearance + does endorse symptoms being present.  ChadVASc - 2.  At this time start Eliquis 5 MG BID and Metoprolol Tartrate 25 MG BID + urgent referral to cardiology.  Educated patient at length on findings, discussed with her will start out with current regimen, but this may be altered by cardiology after their exam. Echo ordered. Return in 2 weeks. ? ?  ?  ? Relevant Medications  ? rosuvastatin (CRESTOR) 10 MG tablet  ? Other Relevant Orders  ? EKG 12-Lead (Completed)  ? Ambulatory referral to Cardiology  ? ECHOCARDIOGRAM COMPLETE  ? ?Other Visit Diagnoses   ? ? Abnormal white blood cell (WBC) count      ? Noted on recent labs.  Recheck CBC, ESR, and CRP today.  Plus check UA.  ? Relevant Orders  ? Urinalysis, Routine w reflex microscopic (Completed)  ? Pyuria      ? Send urine for culture.  ? Relevant Orders  ? Urine Culture  ? ?  ?  ?Time: 25 minutes, >50% spent counseling/or care coordination ? ? ?Follow up plan: ?Return in about 2 weeks (around 10/09/2021) for Atrial Flutter. ? ? ? ? ? ?

## 2021-09-26 ENCOUNTER — Encounter: Payer: Self-pay | Admitting: Nurse Practitioner

## 2021-09-26 LAB — CBC WITH DIFFERENTIAL/PLATELET
Basophils Absolute: 0.1 10*3/uL (ref 0.0–0.2)
Basos: 1 %
EOS (ABSOLUTE): 0.1 10*3/uL (ref 0.0–0.4)
Eos: 0 %
Hematocrit: 42.5 % (ref 34.0–46.6)
Hemoglobin: 14.4 g/dL (ref 11.1–15.9)
Immature Grans (Abs): 0 10*3/uL (ref 0.0–0.1)
Immature Granulocytes: 0 %
Lymphocytes Absolute: 7.4 10*3/uL — ABNORMAL HIGH (ref 0.7–3.1)
Lymphs: 50 %
MCH: 30.5 pg (ref 26.6–33.0)
MCHC: 33.9 g/dL (ref 31.5–35.7)
MCV: 90 fL (ref 79–97)
Monocytes Absolute: 0.8 10*3/uL (ref 0.1–0.9)
Monocytes: 6 %
Neutrophils Absolute: 6.3 10*3/uL (ref 1.4–7.0)
Neutrophils: 43 %
Platelets: 213 10*3/uL (ref 150–450)
RBC: 4.72 x10E6/uL (ref 3.77–5.28)
RDW: 12.7 % (ref 11.7–15.4)
WBC: 14.6 10*3/uL — ABNORMAL HIGH (ref 3.4–10.8)

## 2021-09-26 LAB — SEDIMENTATION RATE: Sed Rate: 3 mm/hr (ref 0–40)

## 2021-09-26 LAB — C-REACTIVE PROTEIN: CRP: 10 mg/L (ref 0–10)

## 2021-09-26 NOTE — Progress Notes (Signed)
Contacted via [place] -- please check to see if patient receive this message by calling her: ?Good evening Erika Anderson, your labs have returned.  Your white blood cell is elevated still, has actually trended up and lymphocytes too.  I have sent your urine for culture to ensure no infection in this and if infection present I will alert you and we will treat.  We will recheck labs at your visit on 10/09/21.  Inflammatory marker labs are normal.  Any questions?  Please ensure to listen out next week for phone calls from cardiology and dermatology.   ?Keep being amazing!!  Thank you for allowing me to participate in your care.  I appreciate you. ?Kindest regards, ?Josselyne Onofrio ?

## 2021-09-28 ENCOUNTER — Ambulatory Visit
Admission: RE | Admit: 2021-09-28 | Discharge: 2021-09-28 | Disposition: A | Discharge status: Home / Self Care | Payer: Medicare HMO | Source: Ambulatory Visit | Attending: Nurse Practitioner | Admitting: Nurse Practitioner

## 2021-09-28 ENCOUNTER — Other Ambulatory Visit: Payer: Self-pay | Admitting: Nurse Practitioner

## 2021-09-28 ENCOUNTER — Ambulatory Visit
Admission: RE | Admit: 2021-09-28 | Discharge: 2021-09-28 | Disposition: A | Discharge status: Home / Self Care | Payer: Medicare HMO | Source: Home / Self Care | Attending: Nurse Practitioner | Admitting: Nurse Practitioner

## 2021-09-28 ENCOUNTER — Other Ambulatory Visit: Payer: Medicare HMO

## 2021-09-28 DIAGNOSIS — D729 Disorder of white blood cells, unspecified: Secondary | ICD-10-CM

## 2021-09-28 DIAGNOSIS — R0602 Shortness of breath: Secondary | ICD-10-CM | POA: Diagnosis not present

## 2021-09-28 LAB — URINE CULTURE: Organism ID, Bacteria: NO GROWTH

## 2021-09-28 IMAGING — DX DG CHEST 2V
3 series · 3 of 3 positions shown · non-contrast
Comparison: [DATE]

CLINICAL DATA: 70-year-old female with a history shortness of
breath

EXAM:
CHEST - 2 VIEW

[chest pa (1 of 2)]
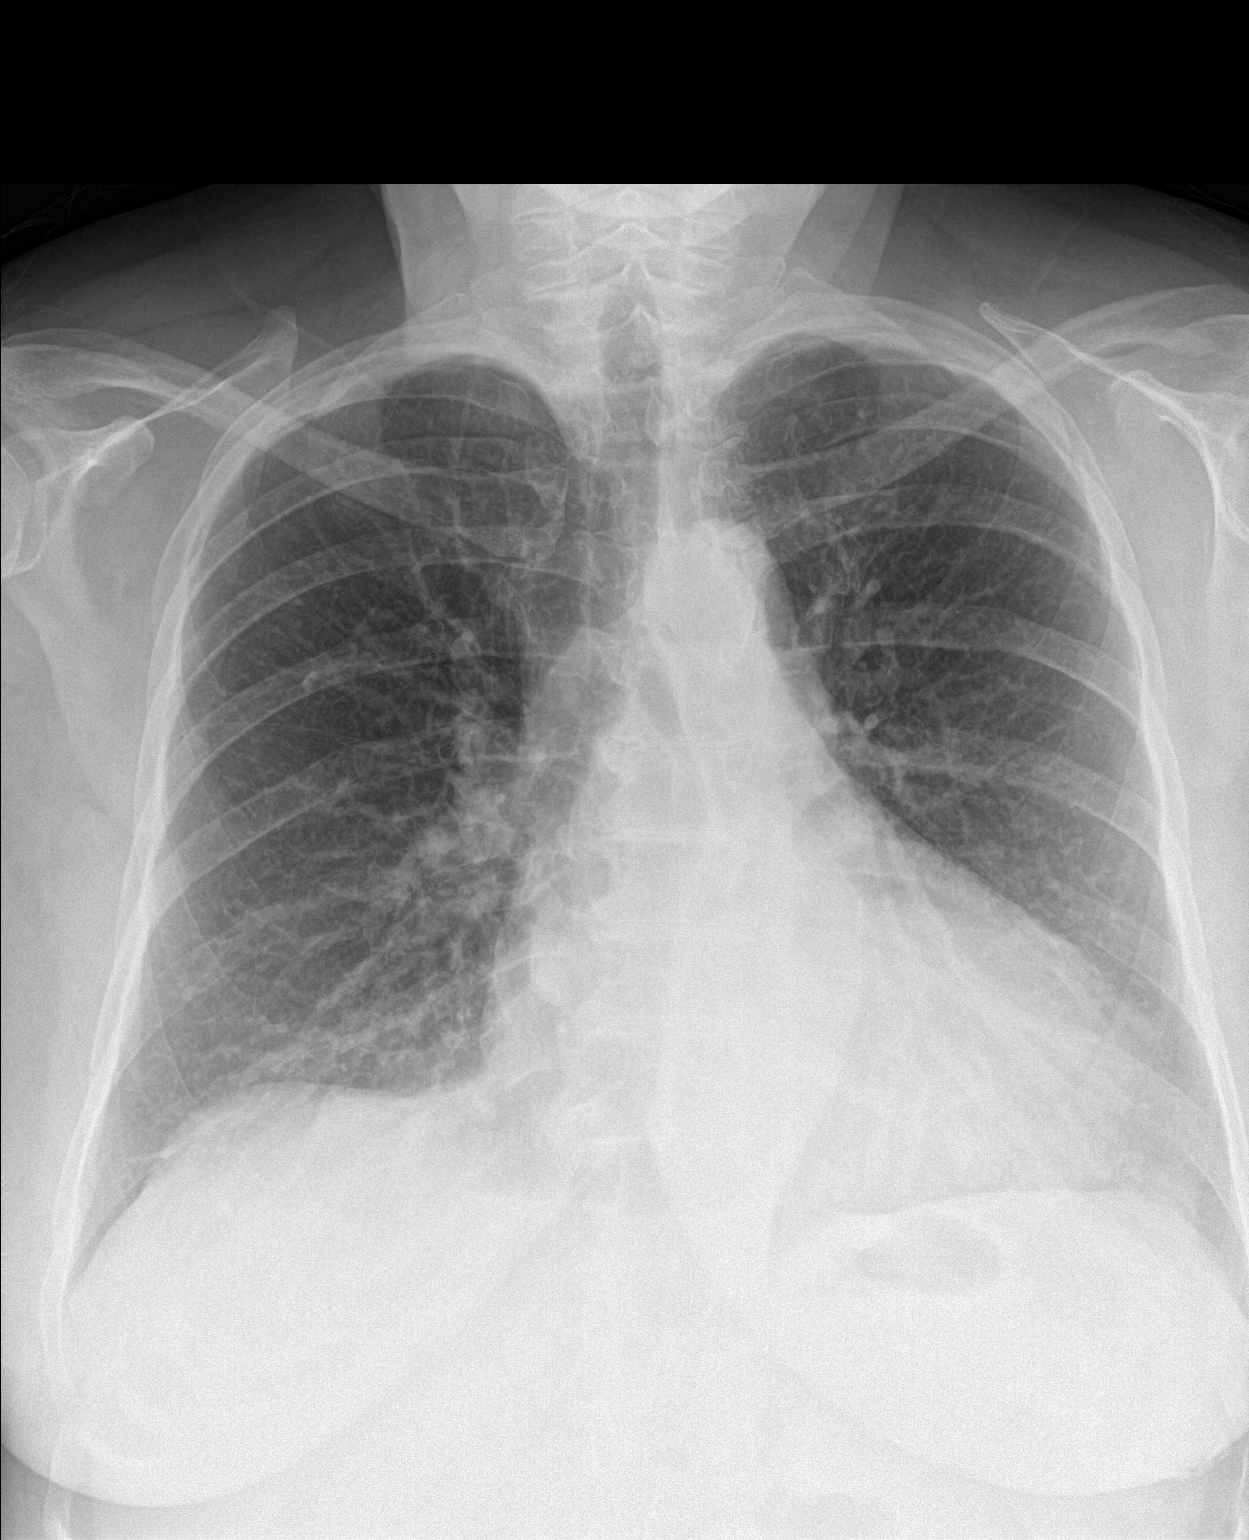

[chest lat]
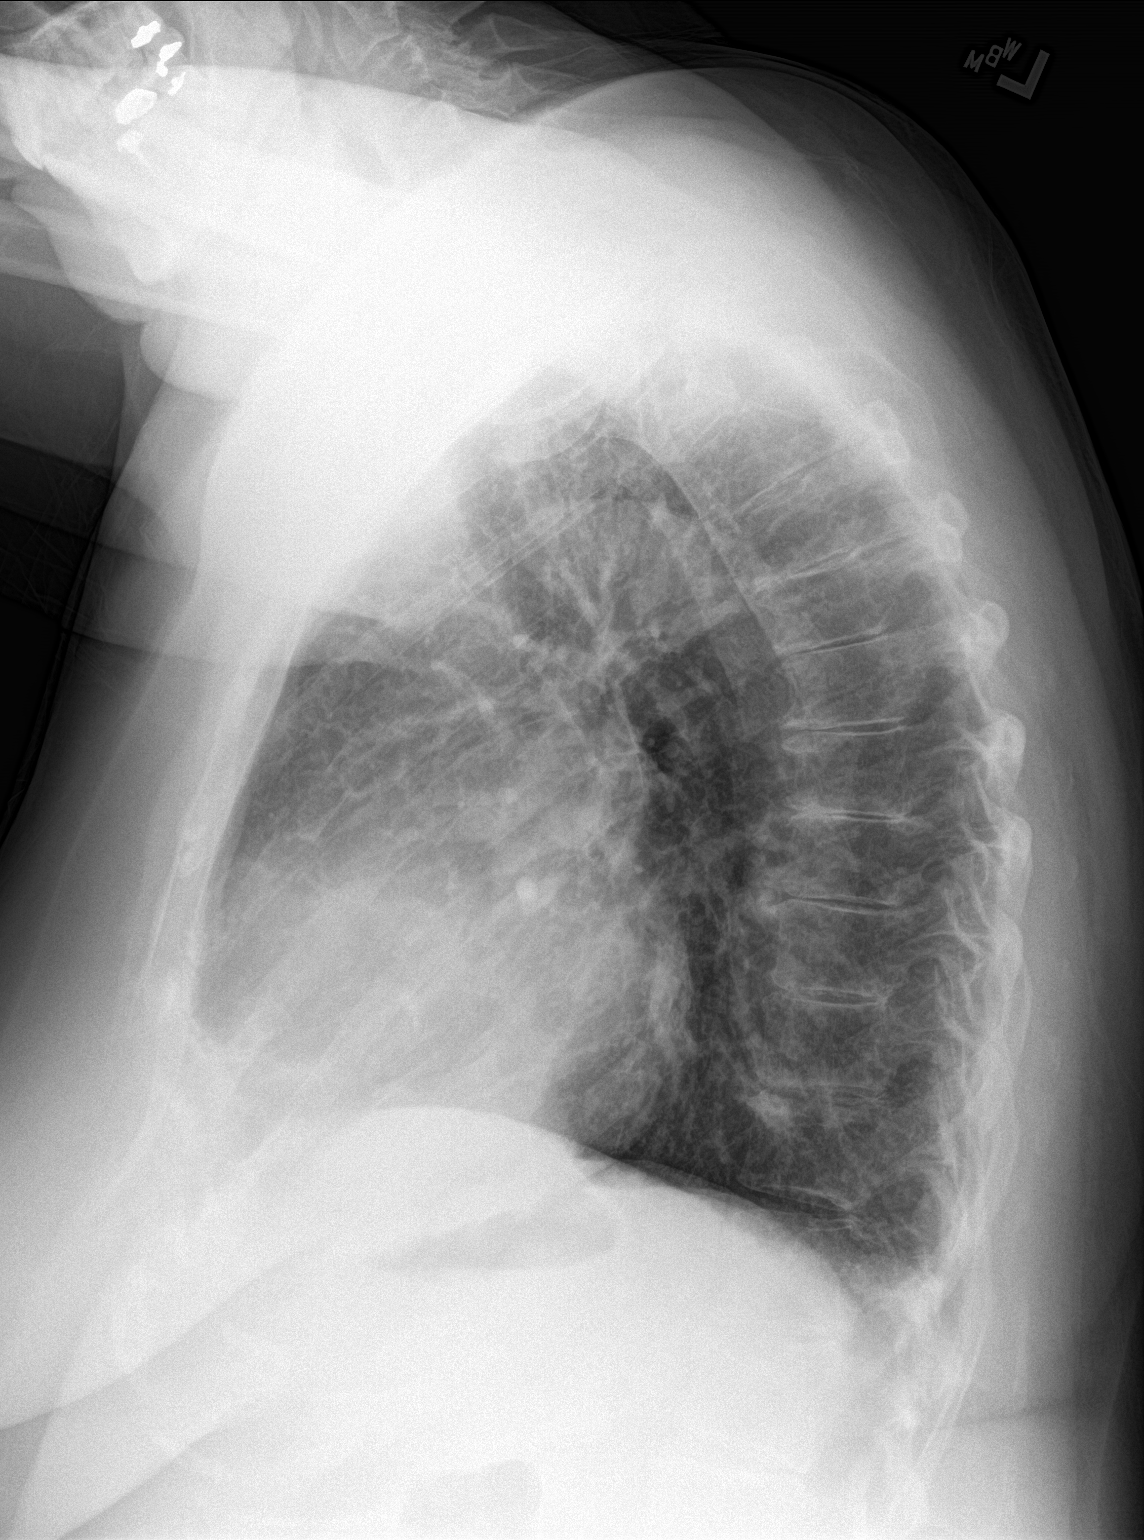

[chest pa (2 of 2)]
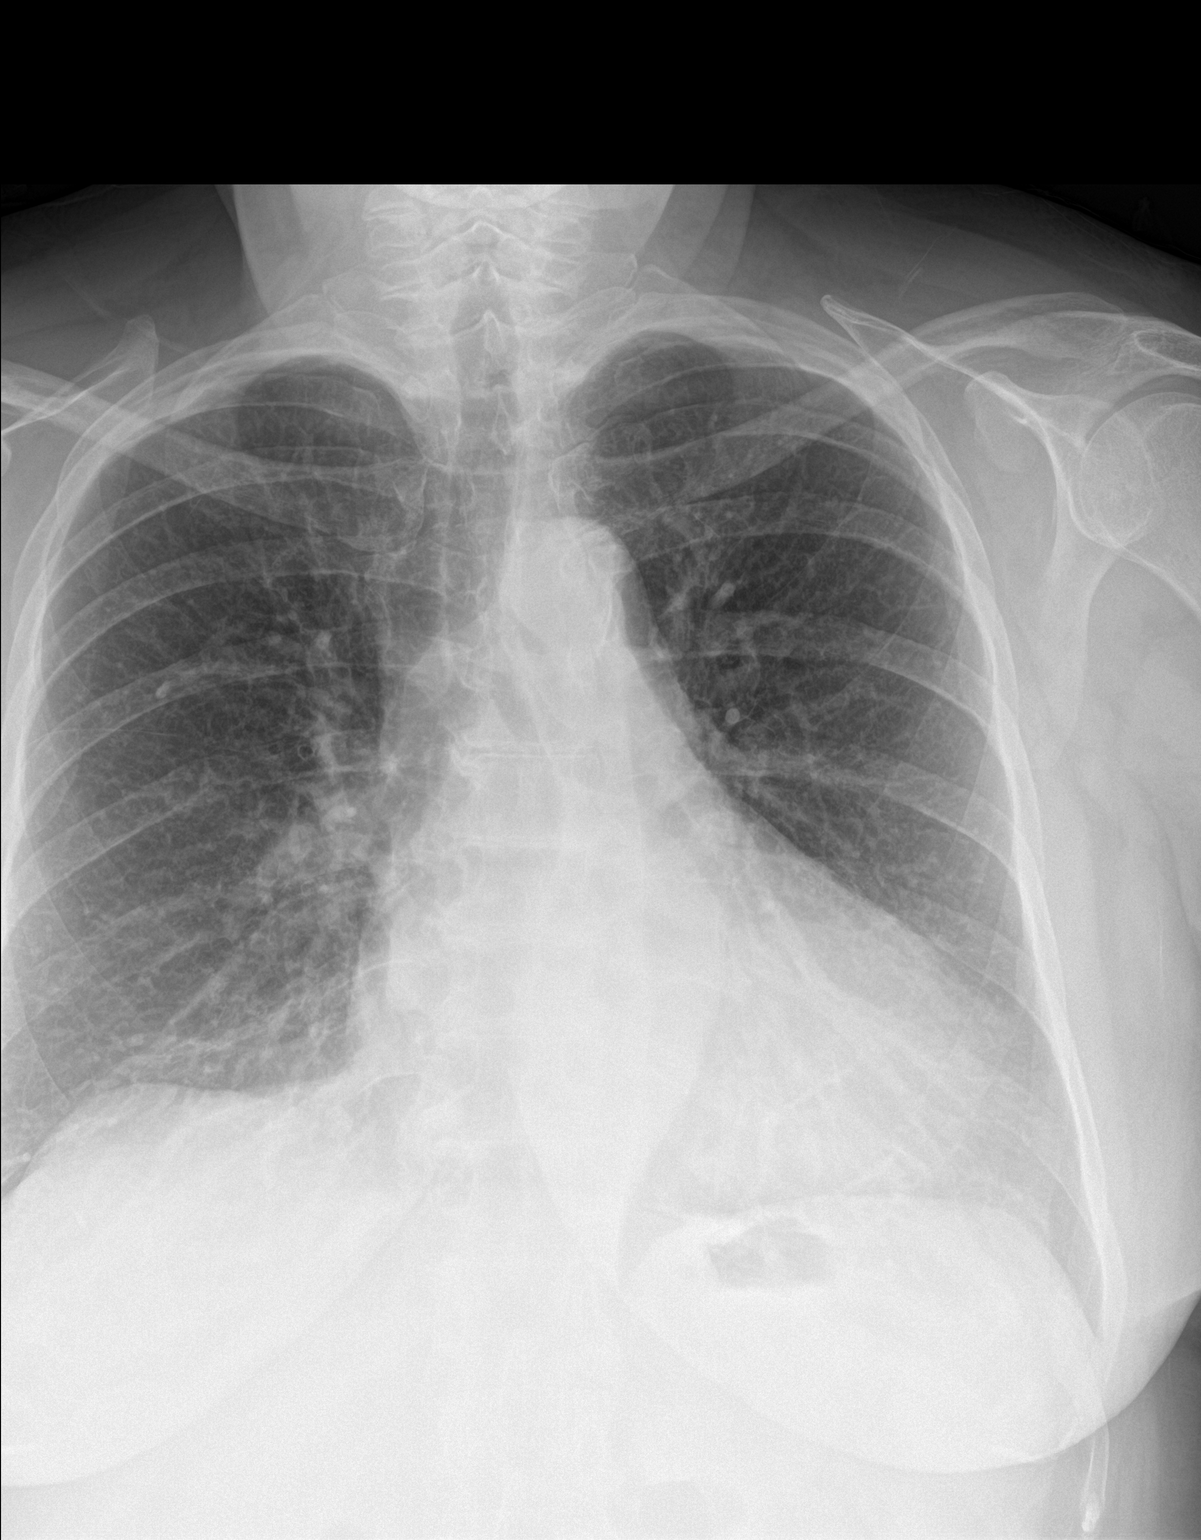

[3 of 3 positions shown; findings below may reference images not displayed]

FINDINGS: Cardiomediastinal silhouette unchanged in size and contour. No
evidence of central vascular congestion. No interlobular septal
thickening.

No pneumothorax or pleural effusion. Coarsened interstitial
markings, with no confluent airspace disease.

No acute displaced fracture. Degenerative changes of the spine.
IMPRESSION: No active cardiopulmonary disease.

## 2021-09-28 NOTE — Progress Notes (Signed)
Contacted via [place] ? ? ?Urine shows no growth, no infection.

## 2021-09-29 ENCOUNTER — Ambulatory Visit: Admit: 2021-09-29 | Payer: Medicare HMO | Admitting: Ophthalmology

## 2021-09-29 SURGERY — PHACOEMULSIFICATION, CATARACT, WITH IOL INSERTION
Anesthesia: Topical | Laterality: Right

## 2021-09-29 NOTE — Progress Notes (Signed)
Contacted via [place] ? ? ?Chest x-ray has returned and show no abnormal findings on review.  We will repeat labs next visit and consider hematology referral if ongoing elevations in white blood cell count.

## 2021-10-02 ENCOUNTER — Encounter: Payer: Self-pay | Admitting: Cardiology

## 2021-10-02 ENCOUNTER — Other Ambulatory Visit
Admission: RE | Admit: 2021-10-02 | Discharge: 2021-10-02 | Disposition: A | Discharge status: Home / Self Care | Payer: Medicare HMO | Attending: Cardiology | Admitting: Cardiology

## 2021-10-02 ENCOUNTER — Ambulatory Visit: Payer: Medicare HMO | Admitting: Cardiology

## 2021-10-02 VITALS — BP 126/70 | HR 111 | Ht 66.0 in | Wt 244.0 lb

## 2021-10-02 DIAGNOSIS — R0683 Snoring: Secondary | ICD-10-CM | POA: Diagnosis not present

## 2021-10-02 DIAGNOSIS — E782 Mixed hyperlipidemia: Secondary | ICD-10-CM

## 2021-10-02 DIAGNOSIS — I4892 Unspecified atrial flutter: Secondary | ICD-10-CM | POA: Diagnosis not present

## 2021-10-02 DIAGNOSIS — Z6839 Body mass index (BMI) 39.0-39.9, adult: Secondary | ICD-10-CM | POA: Diagnosis not present

## 2021-10-02 LAB — CBC
HCT: 40.1 % (ref 36.0–46.0)
Hemoglobin: 13.3 g/dL (ref 12.0–15.0)
MCH: 29.8 pg (ref 26.0–34.0)
MCHC: 33.2 g/dL (ref 30.0–36.0)
MCV: 89.7 fL (ref 80.0–100.0)
Platelets: 193 10*3/uL (ref 150–400)
RBC: 4.47 MIL/uL (ref 3.87–5.11)
RDW: 12.8 % (ref 11.5–15.5)
WBC: 12.4 10*3/uL — ABNORMAL HIGH (ref 4.0–10.5)
nRBC: 0 % (ref 0.0–0.2)

## 2021-10-02 LAB — BASIC METABOLIC PANEL
Anion gap: 5 (ref 5–15)
BUN: 22 mg/dL (ref 8–23)
CO2: 29 mmol/L (ref 22–32)
Calcium: 9.3 mg/dL (ref 8.9–10.3)
Chloride: 105 mmol/L (ref 98–111)
Creatinine, Ser: 0.87 mg/dL (ref 0.44–1.00)
GFR, Estimated: >60 mL/min (ref >60)
Glucose, Bld: 84 mg/dL (ref 70–99)
Potassium: 4 mmol/L (ref 3.5–5.1)
Sodium: 139 mmol/L (ref 135–145)

## 2021-10-02 MED ORDER — METOPROLOL TARTRATE 50 MG PO TABS: 50.0000 mg | ORAL_TABLET | Freq: Two times a day (BID) | ORAL | 3 refills | Status: DC

## 2021-10-02 NOTE — Progress Notes (Signed)
?Cardiology Office Note:   ? ?Date:  10/02/2021  ? ?ID:  Erika Anderson, DOB January 09, 1951, MRN [id number] ? ?PCP:  Venita Lick, NP ?  ?[facility] Providers ?Cardiologist:  None    ? ?Referring MD: Venita Lick, NP  ? ?Chief Complaint  ?Patient presents with  ? New Patient (Initial Visit)  ?  Referred by PCP for palpitations -- when walking. Patient c.o chest pressure with exertion and swelling in left ankle. Meds reviewed verbally with patient.   ? ? ?History of Present Illness:   ? ?Erika Anderson is a 71 y.o. female with a hx of hyperlipidemia, obesity who presents due to shortness of breath. ? ?She states having shortness of breath over the past 2 months with minimal exertion.  Has felt some palpitations when she overexerts herself.  Denies dizziness, syncope.  Also endorses snoring, daytime fatigue, somnolence, lack of energy. ? ?Patient saw her primary care provider 09/25/2021, EKG obtained showed atrial fibrillation heart rate 136.  Started on Lopressor 25 twice daily, Eliquis for stroke prophylaxis. ? ?Underwent a stress echo 2020 at [facility], EF normal, no inducible ischemia. ? ?Past Medical History:  ?Diagnosis Date  ? Hyperlipidemia   ? Obesity (BMI 30-39.9)   ? Restless legs syndrome (RLS) 12/28/2014  ? RLS (restless legs syndrome)   ? ? ?Past Surgical History:  ?Procedure Laterality Date  ? COLONOSCOPY WITH PROPOFOL N/A 08/24/2021  ? Procedure: COLONOSCOPY WITH PROPOFOL;  Surgeon: Jonathon Bellows, MD;  Location: [facility]COPY;  Service: Gastroenterology;  Laterality: N/A;  ? HEMORROIDECTOMY    ? TOTAL ABDOMINAL HYSTERECTOMY    ? ovaries remain  ? ? ?Current Medications: ?Current Meds  ?Medication Sig  ? apixaban (ELIQUIS) 5 MG TABS tablet Take 1 tablet (5 mg total) by mouth 2 (two) times daily.  ? Cholecalciferol (D3) 50 MCG (2000 UT) TABS Take 2,000 tablets by mouth daily.  ? citalopram (CELEXA) 20 MG tablet Take 1 tablet (20 mg total) by mouth daily.  ? Ferrous Sulfate (IRON) 325 (65 Fe) MG  TABS Take 325 mg by mouth daily.  ? gabapentin (NEURONTIN) 300 MG capsule Take 1 capsule by mouth every night.  ? magnesium oxide (MAG-OX) 400 MG tablet Take 400 mg by mouth daily.  ? methocarbamol (ROBAXIN) 500 MG tablet Take 250-500 mg by mouth at bedtime as needed.  ? rOPINIRole (REQUIP) 1 MG tablet Take 1 tablet (1 mg total) by mouth 4 (four) times daily.  ? rosuvastatin (CRESTOR) 10 MG tablet Take 1 tablet (10 mg total) by mouth daily.  ? traZODone (DESYREL) 50 MG tablet TAKE 1/2 TO 1 (ONE-HALF TO ONE) TABLET BY MOUTH AT BEDTIME AS NEEDED FOR SLEEP  ? zinc gluconate 50 MG tablet Take 50 mg by mouth daily.  ? [DISCONTINUED] metoprolol tartrate (LOPRESSOR) 25 MG tablet Take 1 tablet (25 mg total) by mouth 2 (two) times daily.  ?  ? ?Allergies:   Patient has no known allergies.  ? ?Social History  ? ?Socioeconomic History  ? Marital status: Married  ?  Spouse name: Not on file  ? Number of children: Not on file  ? Years of education: 27  ? Highest education level: High school graduate  ?Occupational History  ? Occupation: retired  ?Tobacco Use  ? Smoking status: Never  ? Smokeless tobacco: Never  ?Vaping Use  ? Vaping Use: Never used  ?Substance and Sexual Activity  ? Alcohol use: No  ?  Alcohol/week: 0.0 standard drinks  ? Drug use:  No  ? Sexual activity: Not Currently  ?Other Topics Concern  ? Not on file  ?Social History Narrative  ? Helps take care of grandchildren   ? ?Social Determinants of Health  ? ?Financial Resource Strain: Low Risk   ? Difficulty of Paying Living Expenses: Not hard at all  ?Food Insecurity: No Food Insecurity  ? Worried About Charity fundraiser in the Last Year: Never true  ? Ran Out of Food in the Last Year: Never true  ?Transportation Needs: No Transportation Needs  ? Lack of Transportation (Medical): No  ? Lack of Transportation (Non-Medical): No  ?Physical Activity: Sufficiently Active  ? Days of Exercise per Week: 7 days  ? Minutes of Exercise per Session: 30 min  ?Stress: No  Stress Concern Present  ? Feeling of Stress : Not at all  ?Social Connections: Moderately Integrated  ? Frequency of Communication with Friends and Family: More than three times a week  ? Frequency of Social Gatherings with Friends and Family: More than three times a week  ? Attends Religious Services: More than 4 times per year  ? Active Member of Clubs or Organizations: No  ? Attends Archivist Meetings: Never  ? Marital Status: Married  ?  ? ?Family History: ?The patient's family history includes Arthritis in her sister; Cancer in her mother and paternal grandfather; Dementia in her father; Heart disease in her brother; Stroke in her paternal grandmother. There is no history of Breast cancer. ? ?ROS:   ?Please see the history of present illness.    ? All other systems reviewed and are negative. ? ?EKGs/Labs/Other Studies Reviewed:   ? ?The following studies were reviewed today: ? ? ?EKG:  EKG is  ordered today.  The ekg ordered today demonstrates atrial flutter, heart rate 111 ? ?Recent Labs: ?07/30/2021: ALT 19; TSH 2.120 ?10/02/2021: BUN 22; Creatinine, Ser 0.87; Hemoglobin 13.3; Platelets 193; Potassium 4.0; Sodium 139  ?Recent Lipid Panel ?   ?Component Value Date/Time  ? CHOL 219 (H) 07/30/2021 0857  ? TRIG 189 (H) 07/30/2021 0857  ? HDL 54 07/30/2021 0857  ? [place] 132 (H) 07/30/2021 0857  ? ? ? ?Risk Assessment/Calculations:   ? ? ?    ? ?Physical Exam:   ? ?VS:  BP 126/70 (BP Location: Left Arm, Patient Position: Sitting, Cuff Size: Normal)   Pulse (!) 111   Ht '5\' 6"'$  (1.676 m)   Wt 244 lb (110.7 kg)   SpO2 95%   BMI 39.38 kg/m?    ? ?Wt Readings from Last 3 Encounters:  ?10/02/21 244 lb (110.7 kg)  ?09/25/21 240 lb 6.4 oz (109 kg)  ?08/24/21 247 lb (112 kg)  ?  ? ?GEN:  Well nourished, well developed in no acute distress ?HEENT: Normal ?NECK: No JVD; No carotid bruits ?LYMPHATICS: No lymphadenopathy ?CARDIAC: Irregular irregular, no murmurs ?RESPIRATORY:  Clear to auscultation without rales,  wheezing or rhonchi  ?ABDOMEN: Soft, non-tender, non-distended ?MUSCULOSKELETAL:  No edema; No deformity  ?SKIN: Warm and dry ?NEUROLOGIC:  Alert and oriented x 3 ?PSYCHIATRIC:  Normal affect  ? ?ASSESSMENT:   ? ?1. Atrial flutter, unspecified type ([place])   ?2. Mixed hyperlipidemia   ?3. Snoring   ?4. BMI 39.0-39.9,adult   ? ?PLAN:   ? ?In order of problems listed above: ? ?Atrial flutter, CHA2DS2-VASc score 2.  Increase Lopressor to 50 mg twice daily, continue Eliquis 5 mg twice daily.  Obtain echocardiogram as scheduled.  Plan for DC cardioversion in  3 weeks, refer to A-fib clinic/EP. ?Hyperlipidemia, recently started on Crestor.  Continue Crestor 10 mg daily. ?Snoring, daytime fatigue, somnolence.  Refer to sleep specialist for OSA eval. ?Obesity, low-calorie diet, weight loss advised. ? ? ?Follow-up in 6 weeks. ? ?   ? ?Shared Decision Making/Informed Consent ?The risks (stroke, cardiac arrhythmias rarely resulting in the need for a temporary or permanent pacemaker, skin irritation or burns and complications associated with conscious sedation including aspiration, arrhythmia, respiratory failure and death), benefits (restoration of normal sinus rhythm) and alternatives of a direct current cardioversion were explained in detail to Ms. Kaelin and she agrees to proceed.  ?  ? ? ?Medication Adjustments/Labs and Tests Ordered: ?Current medicines are reviewed at length with the patient today.  Concerns regarding medicines are outlined above.  ?Orders Placed This Encounter  ?Procedures  ? CBC  ? Basic metabolic panel  ? Ambulatory referral to Pulmonology  ? Ambulatory referral to Cardiac Electrophysiology  ? EKG 12-Lead  ? ?Meds ordered this encounter  ?Medications  ? metoprolol tartrate (LOPRESSOR) 50 MG tablet  ?  Sig: Take 1 tablet (50 mg total) by mouth 2 (two) times daily.  ?  Dispense:  60 tablet  ?  Refill:  3  ? ? ?Patient Instructions  ?Medication Instructions:  ? ?Your physician has recommended you make the  following change in your medication:  ? ? INCREASE your Metoprolol Tartrate (Lopressor) to 50 MG twice a day. ? ?*If you need a refill on your cardiac medications before your next appointment, please call your p

## 2021-10-02 NOTE — Patient Instructions (Signed)
Medication Instructions:  ? ?Your physician has recommended you make the following change in your medication:  ? ? INCREASE your Metoprolol Tartrate (Lopressor) to 50 MG twice a day. ? ?*If you need a refill on your cardiac medications before your next appointment, please call your pharmacy* ? ? ?Lab Work: ? ?Please go to the medical mall for a lab draw (CBC, BMP) after your appointment today. ? ? ?Testing/Procedures: ? ?You are scheduled for a Cardioversion on _Wednesday _5/17/23___with Dr._Agbor-Etang__________ ?Please arrive at the [facility] at ___0630______ a.m. on the day of your procedure. ? ?DIET INSTRUCTIONS:  ? ?Nothing to eat or drink after midnight except your medications with a sip of water. ? ?      ?Labs: __Drawn after office visit________________ ? ?Medications:  YOU MAY TAKE ALL of your remaining medications with a small amount of water. ? ?Must have a responsible person to drive you home. ? ?Bring a current list of your medications and current insurance cards.  ? ? ?If you have any questions after you get home, please call the office at [phone]  ? ? ?Follow-Up: ?At [facility], you and your health needs are our priority.  As part of our continuing mission to provide you with exceptional heart care, we have created designated Provider Care Teams.  These Care Teams include your primary Cardiologist (physician) and Advanced Practice Providers (APPs -  Physician Assistants and Nurse Practitioners) who all work together to provide you with the care you need, when you need it. ? ?We recommend signing up for the patient portal called "MyChart".  Sign up information is provided on this After Visit Summary.  MyChart is used to connect with patients for Virtual Visits (Telemedicine).  Patients are able to view lab/test results, encounter notes, upcoming appointments, etc.  Non-urgent messages can be sent to your provider as well.   ?To learn more about what you can do with MyChart, go to  [email or web address].   ? ?Your next appointment:   ?6 week(s) ? ?The format for your next appointment:   ?In Person ? ?Provider:   ?You may see Kate Sable, MD or one of the following Advanced Practice Providers on your designated Care Team:   ?Murray Hodgkins, NP ?Christell Faith, PA-C ?Cadence Kathlen Mody, PA-C  ? ? ?Other Instructions ? ? ?Important Information About Sugar ? ? ? ? ? ? ?

## 2021-10-04 NOTE — Progress Notes (Signed)
?Electrophysiology Office Note:   ? ?Date:  10/07/2021  ? ?ID:  Erika Anderson, DOB 04/04/1951, MRN [id number] ? ?PCP:  Venita Lick, NP  ?[facility] Cardiologist:  None  ?[facility] Electrophysiologist:  Vickie Epley, MD  ? ?Referring MD: Kate Sable, MD  ? ?Chief Complaint: Atrial flutter ? ?History of Present Illness:   ? ?Erika Anderson is a 71 y.o. female who presents for an evaluation of atrial flutter at the request of Dr. Garen Lah. Their medical history includes hyperlipidemia and obesity.  The patient was last seen by Dr. Garen Lah October 02, 2021.  That appointment was for shortness of breath over the last 2 months.  She first was seen by her primary care physician.  EKG was obtained which showed atrial fibrillation and a heart rate of 136 bpm.   ? ?Today she tells me that she felt short of breath which prompted the diagnosis of atrial fibrillation.  She tells me she still feels fullness in her head and chest at times.  Even today she feels this fullness despite being in normal rhythm.  It seems that her shortness of breath has improved now that she is back in normal rhythm.  She is taking her Eliquis without bleeding issues.  She does tell me that her mother had strokes but is unsure about diagnosis of atrial fibrillation.  She snores at night and has woken herself up many times from loud snoring.  She has an appointment coming up for a sleep study. ? ? ?  ?Past Medical History:  ?Diagnosis Date  ? Hyperlipidemia   ? Obesity (BMI 30-39.9)   ? Restless legs syndrome (RLS) 12/28/2014  ? RLS (restless legs syndrome)   ? ? ?Past Surgical History:  ?Procedure Laterality Date  ? COLONOSCOPY WITH PROPOFOL N/A 08/24/2021  ? Procedure: COLONOSCOPY WITH PROPOFOL;  Surgeon: Jonathon Bellows, MD;  Location: [facility] ENDOSCOPY;  Service: Gastroenterology;  Laterality: N/A;  ? HEMORROIDECTOMY    ? TOTAL ABDOMINAL HYSTERECTOMY    ? ovaries remain  ? ? ?Current Medications: ?Current Meds   ?Medication Sig  ? apixaban (ELIQUIS) 5 MG TABS tablet Take 1 tablet (5 mg total) by mouth 2 (two) times daily.  ? Cholecalciferol (D3) 50 MCG (2000 UT) TABS Take 2,000 tablets by mouth daily.  ? citalopram (CELEXA) 20 MG tablet Take 1 tablet (20 mg total) by mouth daily.  ? Ferrous Sulfate (IRON) 325 (65 Fe) MG TABS Take 325 mg by mouth daily.  ? gabapentin (NEURONTIN) 300 MG capsule Take 1 capsule by mouth every night.  ? magnesium oxide (MAG-OX) 400 MG tablet Take 400 mg by mouth daily.  ? methocarbamol (ROBAXIN) 500 MG tablet Take 250-500 mg by mouth at bedtime as needed.  ? metoprolol tartrate (LOPRESSOR) 50 MG tablet Take 1 tablet (50 mg total) by mouth 2 (two) times daily.  ? rOPINIRole (REQUIP) 1 MG tablet Take 1 tablet (1 mg total) by mouth 4 (four) times daily.  ? rosuvastatin (CRESTOR) 10 MG tablet Take 1 tablet (10 mg total) by mouth daily.  ? traZODone (DESYREL) 50 MG tablet TAKE 1/2 TO 1 (ONE-HALF TO ONE) TABLET BY MOUTH AT BEDTIME AS NEEDED FOR SLEEP  ? zinc gluconate 50 MG tablet Take 50 mg by mouth daily.  ?  ? ?Allergies:   Patient has no known allergies.  ? ?Social History  ? ?Socioeconomic History  ? Marital status: Married  ?  Spouse name: Not on file  ? Number of children: Not  on file  ? Years of education: 69  ? Highest education level: High school graduate  ?Occupational History  ? Occupation: retired  ?Tobacco Use  ? Smoking status: Never  ? Smokeless tobacco: Never  ?Vaping Use  ? Vaping Use: Never used  ?Substance and Sexual Activity  ? Alcohol use: No  ?  Alcohol/week: 0.0 standard drinks  ? Drug use: No  ? Sexual activity: Not Currently  ?Other Topics Concern  ? Not on file  ?Social History Narrative  ? Helps take care of grandchildren   ? ?Social Determinants of Health  ? ?Financial Resource Strain: Low Risk   ? Difficulty of Paying Living Expenses: Not hard at all  ?Food Insecurity: No Food Insecurity  ? Worried About Charity fundraiser in the Last Year: Never true  ? Ran Out of Food  in the Last Year: Never true  ?Transportation Needs: No Transportation Needs  ? Lack of Transportation (Medical): No  ? Lack of Transportation (Non-Medical): No  ?Physical Activity: Sufficiently Active  ? Days of Exercise per Week: 7 days  ? Minutes of Exercise per Session: 30 min  ?Stress: No Stress Concern Present  ? Feeling of Stress : Not at all  ?Social Connections: Moderately Integrated  ? Frequency of Communication with Friends and Family: More than three times a week  ? Frequency of Social Gatherings with Friends and Family: More than three times a week  ? Attends Religious Services: More than 4 times per year  ? Active Member of Clubs or Organizations: No  ? Attends Archivist Meetings: Never  ? Marital Status: Married  ?  ? ?Family History: ?The patient's family history includes Arthritis in her sister; Cancer in her mother and paternal grandfather; Dementia in her father; Heart disease in her brother; Stroke in her paternal grandmother. There is no history of Breast cancer. ? ?ROS:   ?Please see the history of present illness.    ?All other systems reviewed and are negative. ? ?EKGs/Labs/Other Studies Reviewed:   ? ?The following studies were reviewed today: ? ?September 25, 2021 EKG shows atrial fibrillation with rapid ventricular rates ? ?EKG:  The ekg ordered today demonstrates sinus rhythm.  Ventricular rate 52 beats a minute.  QTc 420 ms. ? ? ?Recent Labs: ?07/30/2021: ALT 19; TSH 2.120 ?10/02/2021: BUN 22; Creatinine, Ser 0.87; Hemoglobin 13.3; Platelets 193; Potassium 4.0; Sodium 139  ?Recent Lipid Panel ?   ?Component Value Date/Time  ? CHOL 219 (H) 07/30/2021 0857  ? TRIG 189 (H) 07/30/2021 0857  ? HDL 54 07/30/2021 0857  ? [place] 132 (H) 07/30/2021 0857  ? ? ?Physical Exam:   ? ?VS:  BP 140/76   Pulse (!) 52   Ht '5\' 6"'$  (1.676 m)   Wt 244 lb (110.7 kg)   SpO2 98%   BMI 39.38 kg/m?    ? ?Wt Readings from Last 3 Encounters:  ?10/07/21 244 lb (110.7 kg)  ?10/02/21 244 lb (110.7 kg)   ?09/25/21 240 lb 6.4 oz (109 kg)  ?  ? ?GEN:  Well nourished, well developed in no acute distress.  Obese ?HEENT: Normal ?NECK: No JVD; No carotid bruits ?LYMPHATICS: No lymphadenopathy ?CARDIAC: RRR, no murmurs, rubs, gallops ?RESPIRATORY:  Clear to auscultation without rales, wheezing or rhonchi  ?ABDOMEN: Soft, non-tender, non-distended ?MUSCULOSKELETAL:  No edema; No deformity  ?SKIN: Warm and dry ?NEUROLOGIC:  Alert and oriented x 3 ?PSYCHIATRIC:  Normal affect  ? ? ?  ? ?ASSESSMENT:   ? ?  1. Persistent atrial fibrillation ([place])   ?2. Class 2 obesity due to excess calories with body mass index (BMI) of 37.0 to 37.9 in adult, unspecified whether serious comorbidity present   ?3. Sleep-disordered breathing   ? ?PLAN:   ? ?In order of problems listed above: ? ?#Persistent atrial fibrillation ?Symptomatic with shortness of breath.  Back in normal rhythm today.  We will cancel her cardioversion. ? ?Unclear what the burden of atrial fibrillation is at this time.  I will have her wear a 2-week ZIO monitor and see her back in about 6 weeks to review the results and to determine next steps.  If she is still having atrial fibrillation, I think it is reasonable to consider catheter ablation versus antiarrhythmic drug therapy to help maintain normal rhythm. ? ?It would be important for her to complete the OSA evaluation given her snoring and atrial fibrillation diagnosis. ? ?#Obesity ?Weight loss encouraged. ? ?#Sleep disordered breathing ?Sleep study planned. ? ?Follow-up with me in 6 weeks. ? ? ? ? ?Medication Adjustments/Labs and Tests Ordered: ?Current medicines are reviewed at length with the patient today.  Concerns regarding medicines are outlined above.  ?No orders of the defined types were placed in this encounter. ? ?No orders of the defined types were placed in this encounter. ? ? ? ?Signed, ?Lysbeth Galas T. Quentin Ore, MD, [facility], [place] ?10/07/2021 10:08 AM    ?Electrophysiology ?[place] ?

## 2021-10-04 NOTE — Patient Instructions (Signed)
Atrial Flutter  Atrial flutter is a type of abnormal heart rhythm (arrhythmia). The heart has an electrical system that tells it how to beat. In atrial flutter, the signals move rapidly in the top chambers of the heart (the atria). This makes your heart beat very fast. Atrial flutter can come and go, or it can be permanent. The goal of treatment is to prevent blood clots from forming, control your heart rate, or restore your heartbeat to a normal rhythm. If this condition is not treated, it can cause serious problems, such as a weakened heart muscle (cardiomyopathy) or a stroke. What are the causes? This condition is often caused by conditions that damage the heart's electrical system. These include: Heart conditions and heart surgery. These include heart attacks and open-heart surgery. Lung problems, such as COPD or a blood clot in the lung (pulmonary embolism, or PE). Poorly controlled high blood pressure (hypertension). Overactive thyroid (hyperthyroidism). Diabetes. In some cases, the cause of this condition is not known. What increases the risk? You are more likely to develop this condition if: You are an elderly adult. You are a man. You are overweight (obese). You have obstructive sleep apnea. You have a family history of atrial flutter. You have diabetes. You drink a lot of alcohol, especially binge drinking. You use drugs, including cannabis. You smoke. What are the signs or symptoms? Symptoms of this condition include: A feeling that your heart is pounding or racing (palpitations). Shortness of breath. Chest pain. Feeling dizzy or light-headed. Fainting. Low blood pressure (hypotension). Fatigue. Tiring easily during exercise or activity. In some cases, there are no symptoms. How is this diagnosed? This condition may be diagnosed with: An electrocardiogram (ECG) to check electrical signals of the heart. An ambulatory cardiac monitor to record your heart's activity for a  few days. An echocardiogram to create pictures of your heart. A transesophageal echocardiogram (TEE) to create even better pictures of your heart. A stress test to check your blood supply while you exercise. Imaging tests, such as a CT scan or chest X-ray. Blood tests. How is this treated? Treatment depends on underlying conditions and how you feel when you experience atrial flutter. This condition may be treated with: Medicines to prevent blood clots or to treat heart rate or heart rhythm problems. Electrical cardioversion to reset the heart's rhythm. Ablation to remove the heart tissue that sends abnormal signals. Left atrial appendage closure to seal the area where blood clots can form. In some cases, underlying conditions will be treated. Follow these instructions at home: Medicines Take over-the-counter and prescription medicines only as told by your health care provider. Do not take any new medicines without talking to your health care provider. If you are taking blood thinners: Talk with your health care provider before you take any medicines that contain aspirin or NSAIDs, such as ibuprofen. These medicines increase your risk for dangerous bleeding. Take your medicine exactly as told, at the same time every day. Avoid activities that could cause injury or bruising, and follow instructions about how to prevent falls. Wear a medical alert bracelet or carry a card that lists what medicines you take. Lifestyle Eat heart-healthy foods. Talk with a dietitian to make an eating plan that is right for you. Do not use any products that contain nicotine or tobacco, such as cigarettes, e-cigarettes, and chewing tobacco. If you need help quitting, ask your health care provider. Do not drink alcohol. Do not use drugs, including cannabis. Lose weight if you are overweight   or obese. Exercise regularly as instructed by your health care provider. General instructions Do not use diet pills unless  your health care provider approves. Diet pills may make heart problems worse. If you have obstructive sleep apnea, manage your condition as told by your health care provider. Keep all follow-up visits as told by your health care provider. This is important. Contact a health care provider if you: Notice a change in the rate, rhythm, or strength of your heartbeat. Are taking a blood thinner and you notice more bruising. Have a sudden change in weight. Tire more easily when you exercise or do heavy work. Get help right away if you have: Pain or pressure in your chest. Shortness of breath. Fainting. Increasing sweating with no known cause. Side effects of blood thinners, such as blood in your vomit, stool, or urine, or bleeding that cannot stop. Any symptoms of a stroke. "BE FAST" is an easy way to remember the main warning signs of a stroke: B - Balance. Signs are dizziness, sudden trouble walking, or loss of balance. E - Eyes. Signs are trouble seeing or a sudden change in vision. F - Face. Signs are sudden weakness or numbness of the face, or the face or eyelid drooping on one side. A - Arms. Signs are weakness or numbness in an arm. This happens suddenly and usually on one side of the body. S - Speech. Signs are sudden trouble speaking, slurred speech, or trouble understanding what people say. T - Time. Time to call emergency services. Write down what time symptoms started. Other signs of a stroke, such as: A sudden, severe headache with no known cause. Nausea or vomiting. Seizure. These symptoms may represent a serious problem that is an emergency. Do not wait to see if the symptoms will go away. Get medical help right away. Call your local emergency services (911 in the [place] Do not drive yourself to the hospital. Summary Atrial flutter is an abnormal heart rhythm that can give you symptoms of palpitations, shortness of breath, or fatigue. Atrial flutter is often treated with medicines  to keep your heart in a normal rhythm and to prevent a stroke. Get help right away if you cannot catch your breath, or have chest pain or pressure. Get help right away if you have signs or symptoms of a stroke. This information is not intended to replace advice given to you by your health care provider. Make sure you discuss any questions you have with your health care provider. Document Revised: 11/15/2018 Document Reviewed: 11/15/2018 Elsevier Patient Education  2023 Elsevier Inc.  

## 2021-10-07 ENCOUNTER — Ambulatory Visit (INDEPENDENT_AMBULATORY_CARE_PROVIDER_SITE_OTHER): Payer: Medicare HMO

## 2021-10-07 ENCOUNTER — Encounter: Payer: Self-pay | Admitting: Cardiology

## 2021-10-07 ENCOUNTER — Ambulatory Visit: Payer: Medicare HMO | Admitting: Cardiology

## 2021-10-07 VITALS — BP 140/76 | HR 52 | Ht 66.0 in | Wt 244.0 lb

## 2021-10-07 DIAGNOSIS — E6609 Other obesity due to excess calories: Secondary | ICD-10-CM | POA: Diagnosis not present

## 2021-10-07 DIAGNOSIS — I4819 Other persistent atrial fibrillation: Secondary | ICD-10-CM

## 2021-10-07 DIAGNOSIS — G4730 Sleep apnea, unspecified: Secondary | ICD-10-CM

## 2021-10-07 DIAGNOSIS — Z6837 Body mass index (BMI) 37.0-37.9, adult: Secondary | ICD-10-CM | POA: Diagnosis not present

## 2021-10-07 NOTE — Patient Instructions (Addendum)
Medications: ?Your physician recommends that you continue on your current medications as directed. Please refer to the Current Medication list given to you today. ?*If you need a refill on your cardiac medications before your next appointment, please call your pharmacy* ? ?Lab Work: ?None. ?If you have labs (blood work) drawn today and your tests are completely normal, you will receive your results only by: ?MyChart Message (if you have MyChart) OR ?A paper copy in the mail ?If you have any lab test that is abnormal or we need to change your treatment, we will call you to review the results. ? ?Testing/Procedures: ?Your physician has recommended that you wear a heart monitor. Heart monitors are medical devices that record the heart?s electrical activity. Doctors most often use these monitors to diagnose arrhythmias. Arrhythmias are problems with the speed or rhythm of the heartbeat. The monitor is a small, portable device. You can wear one while you do your normal daily activities. This is usually used to diagnose what is causing palpitations/syncope (passing out).  ? ?Follow-Up: ?At [facility], you and your health needs are our priority.  As part of our continuing mission to provide you with exceptional heart care, we have created designated Provider Care Teams.  These Care Teams include your primary Cardiologist (physician) and Advanced Practice Providers (APPs -  Physician Assistants and Nurse Practitioners) who all work together to provide you with the care you need, when you need it. ? ?Your physician wants you to follow-up in: 6 weeks with Lars Mage, MD  ? ?We recommend signing up for the patient portal called "MyChart".  Sign up information is provided on this After Visit Summary.  MyChart is used to connect with patients for Virtual Visits (Telemedicine).  Patients are able to view lab/test results, encounter notes, upcoming appointments, etc.  Non-urgent messages can be sent to your provider as  well.   ?To learn more about what you can do with MyChart, go to [email or web address].   ? ?Any Other Special Instructions Will Be Listed Below (If Applicable). ? ?Canceled your Cardioversion.  ? ?ZIO XT- Long Term Monitor Instructions ? ?Your physician has requested you wear a ZIO patch monitor for 14 days.  ?This is a single patch monitor. [employer] supplies one patch monitor per enrollment. Additional ?stickers are not available. Please do not apply patch if you will be having a Nuclear Stress Test,  ?Echocardiogram, Cardiac CT, MRI, or Chest Xray during the period you would be wearing the  ?monitor. The patch cannot be worn during these tests. You cannot remove and re-apply the  ?ZIO XT patch monitor.  ?Your ZIO patch monitor will be mailed 3 day USPS to your address on file. It may take 3-5 days  ?to receive your monitor after you have been enrolled.  ?Once you have received your monitor, please review the enclosed instructions. Your monitor  ?has already been registered assigning a specific monitor serial # to you. ? ?Billing and Patient Assistance Program Information ? ?We have supplied [employer] with any of your insurance information on file for billing purposes. ?[employer] offers a sliding scale Patient Assistance Program for patients that do not have  ?insurance, or whose insurance does not completely cover the cost of the ZIO monitor.  ?You must apply for the Patient Assistance Program to qualify for this discounted rate.  ?To apply, please call [employer] at [phone], select option 4, select option 2, ask to apply for  ?Patient Assistance Program. Theodore Demark will ask your household income, and  how many people  ?are in your household. They will quote your out-of-pocket cost based on that information.  ?[employer] will also be able to set up a 58-month interest-free payment plan if needed. ? ?Applying the monitor ?  ?Shave hair from upper left chest.  ?Hold abrader disc by orange tab. Rub abrader in 40 strokes  over the upper left chest as  ?indicated in your monitor instructions.  ?Clean area with 4 enclosed alcohol pads. Let dry.  ?Apply patch as indicated in monitor instructions. Patch will be placed under collarbone on left  ?side of chest with arrow pointing upward.  ?Rub patch adhesive wings for 2 minutes. Remove white label marked "1". Remove the white  ?label marked "2". Rub patch adhesive wings for 2 additional minutes.  ?While looking in a mirror, press and release button in center of patch. A small green light will  ?flash 3-4 times. This will be your only indicator that the monitor has been turned on.  ?Do not shower for the first 24 hours. You may shower after the first 24 hours.  ?Press the button if you feel a symptom. You will hear a small click. Record Date, Time and  ?Symptom in the Patient Logbook.  ?When you are ready to remove the patch, follow instructions on the last 2 pages of Patient  ?Logbook. Stick patch monitor onto the last page of Patient Logbook.  ?Place Patient Logbook in the blue and white box. Use locking tab on box and tape box closed  ?securely. The blue and white box has prepaid postage on it. Please place it in the mailbox as  ?soon as possible. Your physician should have your test results approximately 7 days after the  ?monitor has [facility]  ?Call [facility]at [phone] you have questions regarding  ?your ZIO XT patch monitor. Call them immediately if you see an orange light blinking on your  ?monitor.  ?If your monitor falls off in less than 4 days, contact our Monitor department at [phone]  ?If your monitor becomes loose or falls off after 4 days call [employer] at [phone]  ?suggestions on securing your monitor ? ? ?

## 2021-10-09 ENCOUNTER — Ambulatory Visit (INDEPENDENT_AMBULATORY_CARE_PROVIDER_SITE_OTHER): Payer: Medicare HMO | Admitting: Nurse Practitioner

## 2021-10-09 ENCOUNTER — Encounter: Payer: Self-pay | Admitting: Nurse Practitioner

## 2021-10-09 VITALS — BP 136/68 | HR 58 | Temp 98.4°F | Ht 66.0 in | Wt 239.2 lb

## 2021-10-09 DIAGNOSIS — E782 Mixed hyperlipidemia: Secondary | ICD-10-CM | POA: Diagnosis not present

## 2021-10-09 DIAGNOSIS — Z6841 Body Mass Index (BMI) 40.0 and over, adult: Secondary | ICD-10-CM

## 2021-10-09 DIAGNOSIS — G4730 Sleep apnea, unspecified: Secondary | ICD-10-CM

## 2021-10-09 DIAGNOSIS — I4819 Other persistent atrial fibrillation: Secondary | ICD-10-CM

## 2021-10-09 DIAGNOSIS — Z6837 Body mass index (BMI) 37.0-37.9, adult: Secondary | ICD-10-CM

## 2021-10-09 DIAGNOSIS — E6609 Other obesity due to excess calories: Secondary | ICD-10-CM | POA: Diagnosis not present

## 2021-10-09 DIAGNOSIS — G2581 Restless legs syndrome: Secondary | ICD-10-CM

## 2021-10-09 DIAGNOSIS — I700 Atherosclerosis of aorta: Secondary | ICD-10-CM | POA: Diagnosis not present

## 2021-10-09 DIAGNOSIS — D729 Disorder of white blood cells, unspecified: Secondary | ICD-10-CM

## 2021-10-09 DIAGNOSIS — I4892 Unspecified atrial flutter: Secondary | ICD-10-CM

## 2021-10-09 MED ORDER — GABAPENTIN 300 MG PO CAPS: 600.0000 mg | ORAL_CAPSULE | Freq: Every day | ORAL | 4 refills | Status: DC

## 2021-10-09 NOTE — Assessment & Plan Note (Signed)
Ongoing, stable at this time with regular rhythm on auscultation and brady.  Continue current medication regimen and adjust as needed.  Continue to collaborate with cardiology.  All questions answered.  Labs today. ?

## 2021-10-09 NOTE — Assessment & Plan Note (Signed)
Noted on imaging 05/03/18.  Continue statin for prevention and monitor closely. ?

## 2021-10-09 NOTE — Assessment & Plan Note (Signed)
Recommended eating smaller high protein, low fat meals more frequently and exercising 30 mins a day 5 times a week with a goal of 10-15lb weight loss in the next 3 months. Patient voiced their understanding and motivation to adhere to these recommendations.  

## 2021-10-09 NOTE — Assessment & Plan Note (Signed)
Ongoing, at this time continue Crestor as ordered last visit and recheck Lipid panel today. ?

## 2021-10-09 NOTE — Progress Notes (Signed)
? ?BP 136/68 (BP Location: Left Arm, Patient Position: Sitting, Cuff Size: Normal)   Pulse (!) 58   Temp 98.4 ?F (36.9 ?C) (Oral)   Ht '5\' 6"'$  (1.676 m)   Wt 239 lb 3.2 oz (108.5 kg)   SpO2 98%   BMI 38.61 kg/m?   ? ?Subjective:  ? ? Patient ID: Erika Anderson, female    DOB: 01/10/1951, 71 y.o.   MRN: [id number] ? ?HPI: ?Erika Anderson is a 71 y.o. female ? ?Chief Complaint  ?Patient presents with  ? Atrial Fibrillation  ?  Patient is here to follow up on Atrial Fibrillation. Patient states she is still noticing shortness of breath. Patient states she would really like to know what is going on, as she is not able to do as much in the yard without having the shortness of breath. Patient daughter says they have ordered [facility] this week to be mailed out to the patient to wear.   ? ?ATRIAL FIBRILLATION ?Diagnosed by PCP on 09/25/21 -- started on Eliquis and Metoprolol XL.  Saw cardiology for initial visit on 10/02/21 and then follow-up on 10/07/21 -- they have cancelled cardioversion as back in NSR at recent visit.  They are placing a 2-week Zio monitor to determine a-fib burden and then may consider ablation in future.  Grandmother had a stroke and her father had a pacemaker. ? ?She denies any dizziness with Metoprolol.  Continues to have SBE, feeding dogs, outside walking, going up steps.  Cardiology ordered a sleep study.  She continues to Gabapentin at night for RLS and sleep, taking two. ?Atrial fibrillation status: stable ?Satisfied with current treatment: yes  ?Medication side effects:  no ?Medication compliance: good compliance ?Etiology of atrial fibrillation:  ?Palpitations:  no -- does endorse heaviness in chest at times ?Chest pain:  no ?Dyspnea on exertion:  yes ?Orthopnea:  no ?Syncope:  no ?Edema:  no ?Ventricular rate control: B-blocker ?Anti-coagulation: long acting  ? ?Relevant past medical, surgical, family and social history reviewed and updated as indicated. Interim medical history  since our last visit reviewed. ?Allergies and medications reviewed and updated. ? ?Review of Systems  ?Constitutional:  Negative for activity change, appetite change, diaphoresis, fatigue and fever.  ?Respiratory:  Positive for shortness of breath. Negative for cough, chest tightness and wheezing.   ?Cardiovascular:  Negative for chest pain (chest heaviness occasionally), palpitations and leg swelling.  ?Gastrointestinal: Negative.   ?Endocrine: Negative.   ?Neurological: Negative.   ?Psychiatric/Behavioral: Negative.    ? ?Per HPI unless specifically indicated above ? ?   ?Objective:  ?  ?BP 136/68 (BP Location: Left Arm, Patient Position: Sitting, Cuff Size: Normal)   Pulse (!) 58   Temp 98.4 ?F (36.9 ?C) (Oral)   Ht '5\' 6"'$  (1.676 m)   Wt 239 lb 3.2 oz (108.5 kg)   SpO2 98%   BMI 38.61 kg/m?   ?Wt Readings from Last 3 Encounters:  ?10/09/21 239 lb 3.2 oz (108.5 kg)  ?10/07/21 244 lb (110.7 kg)  ?10/02/21 244 lb (110.7 kg)  ?  ?Physical Exam ?Vitals and nursing note reviewed.  ?Constitutional:   ?   General: She is awake. She is not in acute distress. ?   Appearance: She is well-developed and well-groomed. She is obese. She is not ill-appearing or toxic-appearing.  ?HENT:  ?   Head: Normocephalic.  ?   Right Ear: Hearing normal.  ?   Left Ear: Hearing normal.  ?Eyes:  ?   General:  Lids are normal.     ?   Right eye: No discharge.     ?   Left eye: No discharge.  ?   Conjunctiva/sclera: Conjunctivae normal.  ?   Pupils: Pupils are equal, round, and reactive to light.  ?Neck:  ?   Thyroid: No thyromegaly.  ?   Vascular: No carotid bruit.  ?Cardiovascular:  ?   Rate and Rhythm: Regular rhythm. Bradycardia present.  ?   Heart sounds: Normal heart sounds. No murmur heard. ?  No gallop. No S3 or S4 sounds.  ?Pulmonary:  ?   Effort: Pulmonary effort is normal. No accessory muscle usage or respiratory distress.  ?   Breath sounds: Normal breath sounds.  ?Abdominal:  ?   General: Bowel sounds are normal. There is no  distension.  ?   Palpations: Abdomen is soft.  ?   Tenderness: There is no abdominal tenderness.  ?Musculoskeletal:  ?   Cervical back: Normal range of motion and neck supple.  ?   Right lower leg: Edema (trace) present.  ?   Left lower leg: Edema (trace) present.  ?Lymphadenopathy:  ?   Cervical: No cervical adenopathy.  ?Skin: ?   General: Skin is warm and dry.  ?Neurological:  ?   Mental Status: She is alert and oriented to person, place, and time.  ?Psychiatric:     ?   Attention and Perception: Attention normal.     ?   Mood and Affect: Mood normal.     ?   Speech: Speech normal.     ?   Behavior: Behavior normal. Behavior is cooperative.     ?   Thought Content: Thought content normal.  ? ? ?Results for orders placed or performed during the hospital encounter of 10/02/21  ?Basic metabolic panel  ?Result Value Ref Range  ? Sodium 139 135 - 145 mmol/L  ? Potassium 4.0 3.5 - 5.1 mmol/L  ? Chloride 105 98 - 111 mmol/L  ? CO2 29 22 - 32 mmol/L  ? Glucose, Bld 84 70 - 99 mg/dL  ? BUN 22 8 - 23 mg/dL  ? Creatinine, Ser 0.87 0.44 - 1.00 mg/dL  ? Calcium 9.3 8.9 - 10.3 mg/dL  ? GFR, Estimated >60 >60 mL/min  ? Anion gap 5 5 - 15  ?CBC  ?Result Value Ref Range  ? WBC 12.4 (H) 4.0 - 10.5 K/uL  ? RBC 4.47 3.87 - 5.11 MIL/uL  ? Hemoglobin 13.3 12.0 - 15.0 g/dL  ? HCT 40.1 36.0 - 46.0 %  ? MCV 89.7 80.0 - 100.0 fL  ? MCH 29.8 26.0 - 34.0 pg  ? MCHC 33.2 30.0 - 36.0 g/dL  ? RDW 12.8 11.5 - 15.5 %  ? Platelets 193 150 - 400 K/uL  ? nRBC 0.0 0.0 - 0.2 %  ? ?   ?Assessment & Plan:  ? ?Problem List Items Addressed This Visit   ? ?  ? Cardiovascular and Mediastinum  ? Aortic atherosclerosis ([place])  ?  Noted on imaging 05/03/18.  Continue statin for prevention and monitor closely. ? ?  ?  ? Relevant Orders  ? CBC with Differential/Platelet  ? Basic metabolic panel  ? Atrial flutter ([place]) - Primary  ?  Ongoing, stable at this time with regular rhythm on auscultation and brady.  Continue current medication regimen and adjust as  needed.  Continue to collaborate with cardiology.  All questions answered.  Labs today. ? ?  ?  ? Relevant  Orders  ? CBC with Differential/Platelet  ? Basic metabolic panel  ?  ? Other  ? BMI 40.0-44.9, adult ([place])  ?  Recommended eating smaller high protein, low fat meals more frequently and exercising 30 mins a day 5 times a week with a goal of 10-15lb weight loss in the next 3 months. Patient voiced their understanding and motivation to adhere to these recommendations. ? ? ?  ?  ? Hyperlipidemia  ?  Ongoing, at this time continue Crestor as ordered last visit and recheck Lipid panel today. ? ?  ?  ? Relevant Orders  ? Lipid Panel w/o Chol/HDL Ratio  ? Obesity  ?  BMI 38.61.  Recommended eating smaller high protein, low fat meals more frequently and exercising 30 mins a day 5 times a week with a goal of 10-15lb weight loss in the next 3 months. Patient voiced their understanding and motivation to adhere to these recommendations. ? ? ?  ?  ? Restless legs syndrome (RLS)  ?  Chronic and stable with Requip and Gabapentin -- increase Gabapentin to 600 MG at night.  Monitor kidney function closely with medications.  CBC, BMP today. ? ?  ?  ? ?Other Visit Diagnoses   ? ? Abnormal white blood cell (WBC) count      ? Recheck CBC today and consider nephrology referral.  ? Relevant Orders  ? CBC with Differential/Platelet  ? ?  ?  ? ?Follow up plan: ?Return in about 8 weeks (around 12/04/2021) for ATRIAL FIBRILLATION. ? ? ? ? ? ?

## 2021-10-09 NOTE — Assessment & Plan Note (Signed)
BMI 38.61.  Recommended eating smaller high protein, low fat meals more frequently and exercising 30 mins a day 5 times a week with a goal of 10-15lb weight loss in the next 3 months. Patient voiced their understanding and motivation to adhere to these recommendations. ? ?

## 2021-10-09 NOTE — Assessment & Plan Note (Signed)
Chronic and stable with Requip and Gabapentin -- increase Gabapentin to 600 MG at night.  Monitor kidney function closely with medications.  CBC, BMP today. ?

## 2021-10-10 LAB — CBC WITH DIFFERENTIAL/PLATELET
Basophils Absolute: 0.1 10*3/uL (ref 0.0–0.2)
Basos: 1 %
EOS (ABSOLUTE): 0.1 10*3/uL (ref 0.0–0.4)
Eos: 1 %
Hematocrit: 40 % (ref 34.0–46.6)
Hemoglobin: 13.7 g/dL (ref 11.1–15.9)
Immature Grans (Abs): 0 10*3/uL (ref 0.0–0.1)
Immature Granulocytes: 0 %
Lymphocytes Absolute: 5.3 10*3/uL — ABNORMAL HIGH (ref 0.7–3.1)
Lymphs: 53 %
MCH: 30.8 pg (ref 26.6–33.0)
MCHC: 34.3 g/dL (ref 31.5–35.7)
MCV: 90 fL (ref 79–97)
Monocytes Absolute: 0.6 10*3/uL (ref 0.1–0.9)
Monocytes: 6 %
Neutrophils Absolute: 4 10*3/uL (ref 1.4–7.0)
Neutrophils: 39 %
Platelets: 204 10*3/uL (ref 150–450)
RBC: 4.45 x10E6/uL (ref 3.77–5.28)
RDW: 12.6 % (ref 11.7–15.4)
WBC: 10.1 10*3/uL (ref 3.4–10.8)

## 2021-10-10 LAB — BASIC METABOLIC PANEL
BUN/Creatinine Ratio: 21 (ref 12–28)
BUN: 20 mg/dL (ref 8–27)
CO2: 24 mmol/L (ref 20–29)
Calcium: 9.5 mg/dL (ref 8.7–10.3)
Chloride: 103 mmol/L (ref 96–106)
Creatinine, Ser: 0.97 mg/dL (ref 0.57–1.00)
Glucose: 98 mg/dL (ref 70–99)
Potassium: 4.6 mmol/L (ref 3.5–5.2)
Sodium: 141 mmol/L (ref 134–144)
eGFR: 63 mL/min/{1.73_m2} (ref >59)

## 2021-10-10 LAB — LIPID PANEL W/O CHOL/HDL RATIO
Cholesterol, Total: 148 mg/dL (ref 100–199)
HDL: 54 mg/dL (ref >39)
LDL Chol Calc (NIH): 78 mg/dL (ref 0–99)
Triglycerides: 87 mg/dL (ref 0–149)
VLDL Cholesterol Cal: 16 mg/dL (ref 5–40)

## 2021-10-11 NOTE — Progress Notes (Signed)
Contacted via [place] ? ? ?Good afternoon Erika Anderson, I come bearing some good news today: ?- White blood cell count is not in normal range and lymphocytes are coming down. You may have had a little viral bug, there have been some going around. ?- Kidney function, creatinine and eGFR, remains normal. ?- Cholesterol labs MUCH improved with LDL close to goal, continue Rosuvastatin 10 MG for now and in future we may increase to 20 MG to get LDL less then 70.  Any questions? ?Keep being amazing!!  Thank you for allowing me to participate in your care.  I appreciate you. ?Kindest regards, ?Nyjah Schwake ?

## 2021-10-13 ENCOUNTER — Ambulatory Visit: Admit: 2021-10-13 | Payer: Medicare HMO | Admitting: Ophthalmology

## 2021-10-13 SURGERY — PHACOEMULSIFICATION, CATARACT, WITH IOL INSERTION
Anesthesia: Topical | Laterality: Left

## 2021-10-14 ENCOUNTER — Other Ambulatory Visit: Payer: Self-pay | Admitting: Nurse Practitioner

## 2021-10-14 DIAGNOSIS — I4892 Unspecified atrial flutter: Secondary | ICD-10-CM

## 2021-10-15 ENCOUNTER — Ambulatory Visit (INDEPENDENT_AMBULATORY_CARE_PROVIDER_SITE_OTHER): Payer: Medicare HMO

## 2021-10-15 DIAGNOSIS — I4892 Unspecified atrial flutter: Secondary | ICD-10-CM | POA: Diagnosis not present

## 2021-10-15 LAB — ECHOCARDIOGRAM COMPLETE
AR max vel: 2.38 cm2
AV Area VTI: 2.41 cm2
AV Area mean vel: 2.33 cm2
AV Mean grad: 3 mmHg
AV Peak grad: 5.6 mmHg
Ao pk vel: 1.18 m/s
Area-P 1/2: 5.16 cm2
Calc EF: 47.9 %
S' Lateral: 4 cm
Single Plane A2C EF: 45.8 %
Single Plane A4C EF: 48.8 %

## 2021-10-15 NOTE — Progress Notes (Signed)
Contacted via [place] ? ? ?Good evening Erika Anderson, your echo has returned: ?- The pump of the heart (ejection fraction) is mildly reduced and there is some decreased function in the left ventricle -- this means there may be a little heart failure present which could be related to the atrial fibrillation, sometimes this improves once heart rhythm is regular and cardiology will most likely recheck this in upcoming months. ?- The right ventricle is also mildly enlarged -- you are going for sleep study I believe, which will be beneficial.  If sleep apnea this can put strain on heart and needs to be treated to help improve heart health.  Let me know if sleep study visit is scheduled. ?- I will alert cardiology to this echo report and next time in office or in with cardiology one of [place] can pull out heart figure and explain this report further.  Any questions? ?Keep being amazing!!  Thank you for allowing me to participate in your care.  I appreciate you. ?Kindest regards, ?Neeva Trew ?

## 2021-10-21 ENCOUNTER — Ambulatory Visit: Admit: 2021-10-21 | Payer: Medicare HMO | Admitting: Cardiology

## 2021-10-21 SURGERY — CARDIOVERSION
Anesthesia: General

## 2021-10-28 ENCOUNTER — Encounter: Payer: Self-pay | Admitting: Nurse Practitioner

## 2021-10-28 DIAGNOSIS — I4819 Other persistent atrial fibrillation: Secondary | ICD-10-CM | POA: Diagnosis not present

## 2021-10-28 DIAGNOSIS — L918 Other hypertrophic disorders of the skin: Secondary | ICD-10-CM

## 2021-10-28 DIAGNOSIS — G4730 Sleep apnea, unspecified: Secondary | ICD-10-CM | POA: Diagnosis not present

## 2021-11-11 DIAGNOSIS — D239 Other benign neoplasm of skin, unspecified: Secondary | ICD-10-CM | POA: Diagnosis not present

## 2021-11-11 DIAGNOSIS — R52 Pain, unspecified: Secondary | ICD-10-CM | POA: Diagnosis not present

## 2021-11-13 ENCOUNTER — Encounter: Payer: Self-pay | Admitting: Cardiology

## 2021-11-13 ENCOUNTER — Ambulatory Visit: Payer: Medicare HMO | Admitting: Cardiology

## 2021-11-13 VITALS — BP 130/76 | HR 51 | Ht 66.0 in | Wt 240.0 lb

## 2021-11-13 DIAGNOSIS — E782 Mixed hyperlipidemia: Secondary | ICD-10-CM

## 2021-11-13 DIAGNOSIS — I4892 Unspecified atrial flutter: Secondary | ICD-10-CM

## 2021-11-13 DIAGNOSIS — Z6838 Body mass index (BMI) 38.0-38.9, adult: Secondary | ICD-10-CM | POA: Diagnosis not present

## 2021-11-13 DIAGNOSIS — I8393 Asymptomatic varicose veins of bilateral lower extremities: Secondary | ICD-10-CM | POA: Diagnosis not present

## 2021-11-13 NOTE — Progress Notes (Signed)
Cardiology Office Note:    Date:  11/13/2021   ID:  Erika Anderson, DOB 1951-02-18, MRN [id number]  PCP:  Venita Lick, NP   [facility] Providers Cardiologist:  None Electrophysiologist:  Vickie Epley, MD     Referring MD: Venita Lick, NP   Chief Complaint  Patient presents with   Follow-up    Patient c/o DOE and L ankle edema. She notes she did have an injury to her L ankle as a child.    History of Present Illness:    Erika Anderson is a 71 y.o. female with a hx of BPH/atrial flutter, hyperlipidemia, obesity, varicose veins in the legs who presents for follow-up.  Previously seen due to shortness of breath and atrial flutter.  Endorsed snoring, referred to sleep specialist for OSA eval.  Has an appointment upcoming.  Lopressor was increased to 50 mg twice daily after last visit.  Takes Eliquis for stroke prophylaxis.  Cardioversion was previously planned, patient spontaneously converted to sinus rhythm.  Cardioversion canceled.  Still has some shortness of breath, leg edema.  Edema usually gets worse as the day progresses, has varicose veins in the lower extremities.  Endorses eating high calorie diet.  Prior notes Echocardiogram 10/2021 EF 50%. Underwent a stress echo 2020 at [facility], EF normal, no inducible ischemia.  Past Medical History:  Diagnosis Date   Atrial fibrillation ([place])    Hyperlipidemia    Obesity (BMI 30-39.9)    Restless legs syndrome (RLS) 12/28/2014   RLS (restless legs syndrome)     Past Surgical History:  Procedure Laterality Date   COLONOSCOPY WITH PROPOFOL N/A 08/24/2021   Procedure: COLONOSCOPY WITH PROPOFOL;  Surgeon: Jonathon Bellows, MD;  Location: [facility] ENDOSCOPY;  Service: Gastroenterology;  Laterality: N/A;   HEMORROIDECTOMY     TOTAL ABDOMINAL HYSTERECTOMY     ovaries remain    Current Medications: Current Meds  Medication Sig   apixaban (ELIQUIS) 5 MG TABS tablet Take 1 tablet (5 mg total) by mouth 2 (two) times  daily.   Cholecalciferol (D3) 50 MCG (2000 UT) TABS Take 2,000 tablets by mouth daily.   citalopram (CELEXA) 20 MG tablet Take 1 tablet (20 mg total) by mouth daily.   Ferrous Sulfate (IRON) 325 (65 Fe) MG TABS Take 325 mg by mouth daily.   gabapentin (NEURONTIN) 300 MG capsule Take 2 capsules (600 mg total) by mouth at bedtime. Take 1 capsule by mouth every night.   magnesium oxide (MAG-OX) 400 MG tablet Take 400 mg by mouth daily.   methocarbamol (ROBAXIN) 500 MG tablet Take 250-500 mg by mouth at bedtime as needed.   metoprolol tartrate (LOPRESSOR) 50 MG tablet Take 1 tablet (50 mg total) by mouth 2 (two) times daily.   rOPINIRole (REQUIP) 1 MG tablet Take 1 tablet (1 mg total) by mouth 4 (four) times daily.   rosuvastatin (CRESTOR) 10 MG tablet Take 1 tablet (10 mg total) by mouth daily.   traZODone (DESYREL) 50 MG tablet TAKE 1/2 TO 1 (ONE-HALF TO ONE) TABLET BY MOUTH AT BEDTIME AS NEEDED FOR SLEEP   zinc gluconate 50 MG tablet Take 50 mg by mouth daily.     Allergies:   Patient has no known allergies.   Social History   Socioeconomic History   Marital status: Married    Spouse name: Not on file   Number of children: Not on file   Years of education: 12   Highest education level: High school graduate  Occupational  History   Occupation: retired  Tobacco Use   Smoking status: Never   Smokeless tobacco: Never  Vaping Use   Vaping Use: Never used  Substance and Sexual Activity   Alcohol use: No    Alcohol/week: 0.0 standard drinks of alcohol   Drug use: No   Sexual activity: Not Currently  Other Topics Concern   Not on file  Social History Narrative   Helps take care of grandchildren    Social Determinants of Health   Financial Resource Strain: Low Risk  (02/27/2021)   Overall Financial Resource Strain (CARDIA)    Difficulty of Paying Living Expenses: Not hard at all  Food Insecurity: No Food Insecurity (02/27/2021)   Hunger Vital Sign    Worried About Running Out of  Food in the Last Year: Never true    Ran Out of Food in the Last Year: Never true  Transportation Needs: No Transportation Needs (02/27/2021)   PRAPARE - Hydrologist (Medical): No    Lack of Transportation (Non-Medical): No  Physical Activity: Sufficiently Active (02/27/2021)   Exercise Vital Sign    Days of Exercise per Week: 7 days    Minutes of Exercise per Session: 30 min  Stress: No Stress Concern Present (02/27/2021)   [place]    Feeling of Stress : Not at all  Social Connections: Moderately Integrated (02/27/2021)   Social Connection and Isolation Panel [NHANES]    Frequency of Communication with Friends and Family: More than three times a week    Frequency of Social Gatherings with Friends and Family: More than three times a week    Attends Religious Services: More than 4 times per year    Active Member of [employer] or Organizations: No    Attends Music therapist: Never    Marital Status: Married     Family History: The patient's family history includes Arthritis in her sister; Cancer in her mother and paternal grandfather; Dementia in her father; Heart disease in her brother; Stroke in her paternal grandmother. There is no history of Breast cancer.  ROS:   Please see the history of present illness.     All other systems reviewed and are negative.  EKGs/Labs/Other Studies Reviewed:    The following studies were reviewed today:   EKG:  EKG is  ordered today.  The ekg ordered today demonstrates sinus bradycardia at 51  Recent Labs: 07/30/2021: ALT 19; TSH 2.120 10/09/2021: BUN 20; Creatinine, Ser 0.97; Hemoglobin 13.7; Platelets 204; Potassium 4.6; Sodium 141  Recent Lipid Panel    Component Value Date/Time   CHOL 148 10/09/2021 0922   TRIG 87 10/09/2021 0922   HDL 54 10/09/2021 0922   LDLCALC 78 10/09/2021 0922     Risk Assessment/Calculations:           Physical Exam:    VS:  BP 130/76 (BP Location: Left Arm, Patient Position: Sitting, Cuff Size: Large)   Pulse (!) 51   Ht '5\' 6"'$  (1.676 m)   Wt 240 lb (108.9 kg)   SpO2 97%   BMI 38.74 kg/m     Wt Readings from Last 3 Encounters:  11/13/21 240 lb (108.9 kg)  10/09/21 239 lb 3.2 oz (108.5 kg)  10/07/21 244 lb (110.7 kg)     GEN:  Well nourished, well developed in no acute distress HEENT: Normal NECK: No JVD; No carotid bruits CARDIAC: Regular, bradycardic RESPIRATORY:  Clear to auscultation  without rales, wheezing or rhonchi  ABDOMEN: Soft, non-tender, non-distended MUSCULOSKELETAL:  trace edema; varicose veins noted SKIN: Warm and dry NEUROLOGIC:  Alert and oriented x 3 PSYCHIATRIC:  Normal affect   ASSESSMENT:    1. Atrial flutter, unspecified type ([place])   2. Mixed hyperlipidemia   3. BMI 38.0-38.9,adult   4. Varicose veins of both lower extremities, unspecified whether complicated    PLAN:    In order of problems listed above:  Atrial flutter, CHA2DS2-VASc score 2.  Currently in sinus rhythm.  Continue Lopressor to 50 mg twice daily, continue Eliquis 5 mg twice daily.  Echo 10/2021 EF 50%.  Keep appointment for OSA evaluation. Hyperlipidemia,  Continue Crestor 10 mg daily. Obesity, low-calorie diet, weight loss advised. Varicose veins of lower extremities, likely from venous insufficiency.  Compression stockings, leg raising while in a seated position advised refer to vein and vascular clinic.   Follow-up in 6 months.      Medication Adjustments/Labs and Tests Ordered: Current medicines are reviewed at length with the patient today.  Concerns regarding medicines are outlined above.  Orders Placed This Encounter  Procedures   Ambulatory referral to Vascular Surgery   EKG 12-Lead   No orders of the defined types were placed in this encounter.   Patient Instructions  Medication Instructions:  Your physician recommends that you continue on your current  medications as directed. Please refer to the Current Medication list given to you today.  *If you need a refill on your cardiac medications before your next appointment, please call your pharmacy*   Lab Work: None ordered If you have labs (blood work) drawn today and your tests are completely normal, you will receive your results only by: [place] (if you have MyChart) OR A paper copy in the mail If you have any lab test that is abnormal or we need to change your treatment, we will call you to review the results.   Testing/Procedures: None ordered   Follow-Up: At [facility], you and your health needs are our priority.  As part of our continuing mission to provide you with exceptional heart care, we have created designated Provider Care Teams.  These Care Teams include your primary Cardiologist (physician) and Advanced Practice Providers (APPs -  Physician Assistants and Nurse Practitioners) who all work together to provide you with the care you need, when you need it.  We recommend signing up for the patient portal called "MyChart".  Sign up information is provided on this After Visit Summary.  MyChart is used to connect with patients for Virtual Visits (Telemedicine).  Patients are able to view lab/test results, encounter notes, upcoming appointments, etc.  Non-urgent messages can be sent to your provider as well.   To learn more about what you can do with MyChart, go to [email or web address].    Your next appointment:   6 month(s)  The format for your next appointment:   In Person  Provider:   You may see Kate Sable, MD or one of the following Advanced Practice Providers on your designated Care Team:   Murray Hodgkins, NP Christell Faith, PA-C Cadence Kathlen Mody, [place]    Other Instructions   Important Information About Sugar         Signed, Kate Sable, MD  11/13/2021 1:13 PM    [place]

## 2021-11-13 NOTE — Patient Instructions (Signed)
Medication Instructions:  Your physician recommends that you continue on your current medications as directed. Please refer to the Current Medication list given to you today.  *If you need a refill on your cardiac medications before your next appointment, please call your pharmacy*   Lab Work: None ordered If you have labs (blood work) drawn today and your tests are completely normal, you will receive your results only by: [place] (if you have MyChart) OR A paper copy in the mail If you have any lab test that is abnormal or we need to change your treatment, we will call you to review the results.   Testing/Procedures: None ordered   Follow-Up: At [facility], you and your health needs are our priority.  As part of our continuing mission to provide you with exceptional heart care, we have created designated Provider Care Teams.  These Care Teams include your primary Cardiologist (physician) and Advanced Practice Providers (APPs -  Physician Assistants and Nurse Practitioners) who all work together to provide you with the care you need, when you need it.  We recommend signing up for the patient portal called "MyChart".  Sign up information is provided on this After Visit Summary.  MyChart is used to connect with patients for Virtual Visits (Telemedicine).  Patients are able to view lab/test results, encounter notes, upcoming appointments, etc.  Non-urgent messages can be sent to your provider as well.   To learn more about what you can do with MyChart, go to [email or web address].    Your next appointment:   6 month(s)  The format for your next appointment:   In Person  Provider:   You may see Kate Sable, MD or one of the following Advanced Practice Providers on your designated Care Team:   Murray Hodgkins, NP Christell Faith, PA-C Cadence Kathlen Mody, [place]    Other Instructions   Important Information About Sugar

## 2021-11-24 ENCOUNTER — Other Ambulatory Visit: Payer: Self-pay | Admitting: Nurse Practitioner

## 2021-11-25 ENCOUNTER — Ambulatory Visit: Payer: Medicare HMO | Admitting: Cardiology

## 2021-11-25 ENCOUNTER — Encounter: Payer: Self-pay | Admitting: Cardiology

## 2021-11-25 VITALS — BP 136/72 | HR 53 | Ht 66.0 in | Wt 240.6 lb

## 2021-11-25 DIAGNOSIS — Z6837 Body mass index (BMI) 37.0-37.9, adult: Secondary | ICD-10-CM | POA: Diagnosis not present

## 2021-11-25 DIAGNOSIS — E6609 Other obesity due to excess calories: Secondary | ICD-10-CM

## 2021-11-25 DIAGNOSIS — I4819 Other persistent atrial fibrillation: Secondary | ICD-10-CM | POA: Diagnosis not present

## 2021-11-25 DIAGNOSIS — I4892 Unspecified atrial flutter: Secondary | ICD-10-CM | POA: Diagnosis not present

## 2021-11-25 NOTE — Patient Instructions (Signed)
Medications: Your physician recommends that you continue on your current medications as directed. Please refer to the Current Medication list given to you today. *If you need a refill on your cardiac medications before your next appointment, please call your pharmacy*  Lab Work: None. If you have labs (blood work) drawn today and your tests are completely normal, you will receive your results only by: [place] (if you have MyChart) OR A paper copy in the mail If you have any lab test that is abnormal or we need to change your treatment, we will call you to review the results.  Testing/Procedures: Your physician has requested that you have cardiac CT. Cardiac computed tomography (CT) is a painless test that uses an x-ray machine to take clear, detailed pictures of your heart. For further information please visit [email or web address]. Please follow instruction sheet as given.  Your physician has recommended that you have an ablation. Catheter ablation is a medical procedure used to treat some cardiac arrhythmias (irregular heartbeats). During catheter ablation, a long, thin, flexible tube is put into a blood vessel in your groin (upper thigh), or neck. This tube is called an ablation catheter. It is then guided to your heart through the blood vessel. Radio frequency waves destroy small areas of heart tissue where abnormal heartbeats may cause an arrhythmia to start. Please see the instruction sheet given to you today.   Follow-Up: At [facility], you and your health needs are our priority.  As part of our continuing mission to provide you with exceptional heart care, we have created designated Provider Care Teams.  These Care Teams include your primary Cardiologist (physician) and Advanced Practice Providers (APPs -  Physician Assistants and Nurse Practitioners) who all work together to provide you with the care you need, when you need it.  Your physician wants you to follow-up in: The  ablation date picked is OCT 16 and pre op lab date between Sept 25-29. Erika Kluver RN will call you with instructions for CT scan and ablation.   We recommend signing up for the patient portal called "MyChart".  Sign up information is provided on this After Visit Summary.  MyChart is used to connect with patients for Virtual Visits (Telemedicine).  Patients are able to view lab/test results, encounter notes, upcoming appointments, etc.  Non-urgent messages can be sent to your provider as well.   To learn more about what you can do with MyChart, go to [email or web address].    Any Other Special Instructions Will Be Listed Below (If Applicable).  Cardiac Ablation Cardiac ablation is a procedure to destroy (ablate) some heart tissue that is sending bad signals. These bad signals cause problems in heart rhythm. The heart has many areas that make these signals. If there are problems in these areas, they can make the heart beat in a way that is not normal. Destroying some tissues can help make the heart rhythm normal. Tell your doctor about: Any allergies you have. All medicines you are taking. These include vitamins, herbs, eye drops, creams, and over-the-counter medicines. Any problems you or family members have had with medicines that make you fall asleep (anesthetics). Any blood disorders you have. Any surgeries you have had. Any medical conditions you have, such as kidney failure. Whether you are pregnant or may be pregnant. What are the risks? This is a safe procedure. But problems may occur, including: Infection. Bruising and bleeding. Bleeding into the chest. Stroke or blood clots. Damage to nearby areas of your body. Allergies to medicines  or dyes. The need for a pacemaker if the normal system is damaged. Failure of the procedure to treat the problem. What happens before the procedure? Medicines Ask your doctor about: Changing or stopping your normal medicines. This is important. Taking  aspirin and ibuprofen. Do not take these medicines unless your doctor tells you to take them. Taking other medicines, vitamins, herbs, and supplements. General instructions Follow instructions from your doctor about what you cannot eat or drink. Plan to have someone take you home from the hospital or clinic. If you will be going home right after the procedure, plan to have someone with you for 24 hours. Ask your doctor what steps will be taken to prevent infection. What happens during the procedure?  An IV tube will be put into one of your veins. You will be given a medicine to help you relax. The skin on your neck or groin will be numbed. A cut (incision) will be made in your neck or groin. A needle will be put through your cut and into a large vein. A tube (catheter) will be put into the needle. The tube will be moved to your heart. Dye may be put through the tube. This helps your doctor see your heart. Small devices (electrodes) on the tube will send out signals. A type of energy will be used to destroy some heart tissue. The tube will be taken out. Pressure will be held on your cut. This helps stop bleeding. A bandage will be put over your cut. The exact procedure may vary among doctors and hospitals. What happens after the procedure? You will be watched until you leave the hospital or clinic. This includes checking your heart rate, breathing rate, oxygen, and blood pressure. Your cut will be watched for bleeding. You will need to lie still for a few hours. Do not drive for 24 hours or as long as your doctor tells you. Summary Cardiac ablation is a procedure to destroy some heart tissue. This is done to treat heart rhythm problems. Tell your doctor about any medical conditions you may have. Tell him or her about all medicines you are taking to treat them. This is a safe procedure. But problems may occur. These include infection, bruising, bleeding, and damage to nearby areas of your  body. Follow what your doctor tells you about food and drink. You may also be told to change or stop some of your medicines. After the procedure, do not drive for 24 hours or as long as your doctor tells you. This information is not intended to replace advice given to you by your health care provider. Make sure you discuss any questions you have with your health care provider. Document Revised: 04/26/2019 Document Reviewed: 04/26/2019 Elsevier Patient Education  [place].

## 2021-11-25 NOTE — Progress Notes (Signed)
Electrophysiology Office Follow up Visit Note:    Date:  11/25/2021   ID:  Erika Anderson, DOB 1951-01-07, MRN [id number]  PCP:  Erika Lick, NP  [facility] Cardiologist:  None  [facility] Electrophysiologist:  Erika Epley, MD    Interval History:    Erika Anderson is a 71 y.o. female who presents for a follow up visit. They were last seen in clinic Oct 07, 2021 for persistent atrial fibrillation.  The patient is symptomatic with her atrial fibrillation.  At the last visit we ordered a 2-week ZIO monitor to review the overall burden of her atrial fibrillation.  She tells me she continues to have fatigue episodes.  She continues to take her metoprolol and Eliquis without any bleeding issues.  She has a sleep consultation on Friday.      Past Medical History:  Diagnosis Date   Atrial fibrillation ([place])    Hyperlipidemia    Obesity (BMI 30-39.9)    Restless legs syndrome (RLS) 12/28/2014   RLS (restless legs syndrome)     Past Surgical History:  Procedure Laterality Date   COLONOSCOPY WITH PROPOFOL N/A 08/24/2021   Procedure: COLONOSCOPY WITH PROPOFOL;  Surgeon: Erika Bellows, MD;  Location: [facility] ENDOSCOPY;  Service: Gastroenterology;  Laterality: N/A;   HEMORROIDECTOMY     TOTAL ABDOMINAL HYSTERECTOMY     ovaries remain    Current Medications: Current Meds  Medication Sig   apixaban (ELIQUIS) 5 MG TABS tablet Take 1 tablet (5 mg total) by mouth 2 (two) times daily.   Cholecalciferol (D3) 50 MCG (2000 UT) TABS Take 2,000 tablets by mouth daily.   citalopram (CELEXA) 20 MG tablet Take 1 tablet (20 mg total) by mouth daily.   Ferrous Sulfate (IRON) 325 (65 Fe) MG TABS Take 325 mg by mouth daily.   gabapentin (NEURONTIN) 300 MG capsule Take 2 capsules (600 mg total) by mouth at bedtime. Take 1 capsule by mouth every night.   magnesium oxide (MAG-OX) 400 MG tablet Take 400 mg by mouth daily.   methocarbamol (ROBAXIN) 500 MG tablet Take 250-500 mg by  mouth at bedtime as needed.   metoprolol tartrate (LOPRESSOR) 50 MG tablet Take 1 tablet (50 mg total) by mouth 2 (two) times daily.   rOPINIRole (REQUIP) 1 MG tablet Take 1 tablet (1 mg total) by mouth 4 (four) times daily.   rosuvastatin (CRESTOR) 10 MG tablet Take 1 tablet (10 mg total) by mouth daily.   traZODone (DESYREL) 50 MG tablet TAKE 1/2 TO 1 (ONE-HALF TO ONE) TABLET BY MOUTH AT BEDTIME AS NEEDED FOR SLEEP   zinc gluconate 50 MG tablet Take 50 mg by mouth daily.     Allergies:   Patient has no known allergies.   Social History   Socioeconomic History   Marital status: Married    Spouse name: Not on file   Number of children: Not on file   Years of education: 12   Highest education level: High school graduate  Occupational History   Occupation: retired  Tobacco Use   Smoking status: Never   Smokeless tobacco: Never  Vaping Use   Vaping Use: Never used  Substance and Sexual Activity   Alcohol use: No    Alcohol/week: 0.0 standard drinks of alcohol   Drug use: No   Sexual activity: Not Currently  Other Topics Concern   Not on file  Social History Narrative   Helps take care of grandchildren    Social Determinants of Health  Financial Resource Strain: Low Risk  (02/27/2021)   Overall Financial Resource Strain (CARDIA)    Difficulty of Paying Living Expenses: Not hard at all  Food Insecurity: No Food Insecurity (02/27/2021)   Hunger Vital Sign    Worried About Running Out of Food in the Last Year: Never true    Ran Out of Food in the Last Year: Never true  Transportation Needs: No Transportation Needs (02/27/2021)   PRAPARE - Hydrologist (Medical): No    Lack of Transportation (Non-Medical): No  Physical Activity: Sufficiently Active (02/27/2021)   Exercise Vital Sign    Days of Exercise per Week: 7 days    Minutes of Exercise per Session: 30 min  Stress: No Stress Concern Present (02/27/2021)   Erika Anderson    Feeling of Stress : Not at all  Social Connections: Moderately Integrated (02/27/2021)   Social Connection and Isolation Panel [NHANES]    Frequency of Communication with Friends and Family: More than three times a week    Frequency of Social Gatherings with Friends and Family: More than three times a week    Attends Religious Services: More than 4 times per year    Active Member of [employer] or Organizations: No    Attends Music therapist: Never    Marital Status: Married     Family History: The patient's family history includes Arthritis in her sister; Cancer in her mother and paternal grandfather; Dementia in her father; Heart disease in her brother; Stroke in her paternal grandmother. There is no history of Breast cancer.  ROS:   Please see the history of present illness.    All other systems reviewed and are negative.  EKGs/Labs/Other Studies Reviewed:    The following studies were reviewed today:  10/29/2021 Zio monitor HR 43 - 186 bpm, average 71 bpm. 29 episodes of SVT, longest lasting 17.1 seconds with an average rate of 102 bpm. 26% burden of atrial fibrillation (ventricular rates 73 - 186, average 113 bpm) Longest episode of AF lasted 25 hours Symptom triggered episodes correspond to AF. Rare supraventricular and ventricular ectopy. Evidence of atrial fibrillation and flutter on monitor  EKG today shows sinus bradycardia with a ventricular rate of 53 bpm.  Recent Labs: 07/30/2021: ALT 19; TSH 2.120 10/09/2021: BUN 20; Creatinine, Ser 0.97; Hemoglobin 13.7; Platelets 204; Potassium 4.6; Sodium 141  Recent Lipid Panel    Component Value Date/Time   CHOL 148 10/09/2021 0922   TRIG 87 10/09/2021 0922   HDL 54 10/09/2021 0922   LDLCALC 78 10/09/2021 0922    Physical Exam:    VS:  BP 136/72   Pulse (!) 53   Ht '5\' 6"'$  (1.676 m)   Wt 240 lb 9.6 oz (109.1 kg)   SpO2 96%   BMI 38.83 kg/m     Wt Readings from  Last 3 Encounters:  11/25/21 240 lb 9.6 oz (109.1 kg)  11/13/21 240 lb (108.9 kg)  10/09/21 239 lb 3.2 oz (108.5 kg)     GEN:  Well nourished, well developed in no acute distress.  Obese HEENT: Normal NECK: No JVD; No carotid bruits LYMPHATICS: No lymphadenopathy CARDIAC: RRR, no murmurs, rubs, gallops RESPIRATORY:  Clear to auscultation without rales, wheezing or rhonchi  ABDOMEN: Soft, non-tender, non-distended MUSCULOSKELETAL:  No edema; No deformity  SKIN: Warm and dry NEUROLOGIC:  Alert and oriented x 3 PSYCHIATRIC:  Normal affect  ASSESSMENT:    1. Persistent atrial fibrillation ([place])   2. Atrial flutter, unspecified type (HCC)   3. Class 2 obesity due to excess calories with body mass index (BMI) of 37.0 to 37.9 in adult, unspecified whether serious comorbidity present    PLAN:    In order of problems listed above:   #Paroxysmal atrial fibrillation Symptomatic.  Relatively high burden of atrial fibrillation.  Discussed treatment options today for his AF including antiarrhythmic drug therapy and ablation. Discussed risks, recovery and likelihood of success. Discussed potential need for repeat ablation procedures and antiarrhythmic drugs after an initial ablation. They wish to proceed with scheduling.  Risk, benefits, and alternatives to EP study and radiofrequency ablation for afib were also discussed in detail today. These risks include but are not limited to stroke, bleeding, vascular damage, tamponade, perforation, damage to the esophagus, lungs, and other structures, pulmonary vein stenosis, worsening renal function, and death. The patient understands these risk and wishes to proceed.  We will therefore proceed with catheter ablation at the next available time.  Carto, ICE, anesthesia are requested for the procedure.  Will also obtain CT PV protocol prior to the procedure to exclude LAA thrombus and further evaluate atrial anatomy.  Ablation strategy will be  PVI plus CTI.  I will assess the electrical health of the posterior wall during the procedure.  #Possible sleep disordered breathing Encouraged her to keep her appointment for the sleep consultation on Friday.      Medication Adjustments/Labs and Tests Ordered: Current medicines are reviewed at length with the patient today.  Concerns regarding medicines are outlined above.  No orders of the defined types were placed in this encounter.  No orders of the defined types were placed in this encounter.    Signed, Lars Mage, MD, [facility], [facility] 11/25/2021 9:41 AM    Electrophysiology [facility]

## 2021-11-25 NOTE — Telephone Encounter (Signed)
Requested Prescriptions  Pending Prescriptions Disp Refills  . traZODone (DESYREL) 50 MG tablet [Pharmacy Med Name: traZODone HCl 50 MG Oral Tablet] 90 tablet 0    Sig: TAKE 1/2 TO 1 (ONE-HALF TO ONE) TABLET BY MOUTH AT BEDTIME AS NEEDED FOR SLEEP     Psychiatry: Antidepressants - Serotonin Modulator Passed - 11/24/2021  4:34 PM      Passed - Valid encounter within last 6 months    Recent Outpatient Visits          1 month ago Atrial flutter, unspecified type ([place])   [place], Jolene T, NP   2 months ago Irregular heartbeat   [place], [place] T, NP   3 months ago Class 3 severe obesity due to excess calories without serious comorbidity with body mass index (BMI) of 40.0 to 44.9 in adult [facility])   [place] Cannady, Jolene T, NP   10 months ago Chronic bilateral low back pain without sciatica   [place], Jolene T, NP   1 year ago BMI 40.0-44.9, adult ([place])   [place], Barbaraann Faster, NP      Future Appointments            In 1 week Cannady, Barbaraann Faster, NP [employer], [place]   In 2 months [place], Barbaraann Faster, NP [employer], [place]   In 5 months Ag[place]Et[place], Aaron Edelman, MD [place], [place]

## 2021-11-27 ENCOUNTER — Encounter: Payer: Self-pay | Admitting: Adult Health

## 2021-11-27 ENCOUNTER — Ambulatory Visit (INDEPENDENT_AMBULATORY_CARE_PROVIDER_SITE_OTHER): Payer: Medicare HMO | Admitting: Adult Health

## 2021-11-27 DIAGNOSIS — I4891 Unspecified atrial fibrillation: Secondary | ICD-10-CM | POA: Diagnosis not present

## 2021-11-27 DIAGNOSIS — R0683 Snoring: Secondary | ICD-10-CM

## 2021-11-27 NOTE — Progress Notes (Signed)
Reviewed and agree with assessment/plan.   Coralyn Helling, MD [facility] Pulmonary/Critical Care 11/27/2021, 5:12 PM Pager:  [phone]

## 2021-12-06 NOTE — Patient Instructions (Signed)
Atrial Fibrillation  Atrial fibrillation is a type of heartbeat that is irregular or fast. If you have this condition, your heart beats without any order. This makes it hard for your heart to pump blood in a normal way. Atrial fibrillation may come and go, or it may become a long-lasting problem. If this condition is not treated, it can put you at higher risk for stroke, heart failure, and other heart problems. What are the causes? This condition may be caused by diseases that damage the heart. They include: High blood pressure. Heart failure. Heart valve disease. Heart surgery. Other causes include: Diabetes. Thyroid disease. Being overweight. Kidney disease. Sometimes the cause is not known. What increases the risk? You are more likely to develop this condition if: You are older. You smoke. You exercise often and very hard. You have a family history of this condition. You are a man. You use drugs. You drink a lot of alcohol. You have lung conditions, such as emphysema, pneumonia, or COPD. You have sleep apnea. What are the signs or symptoms? Common symptoms of this condition include: A feeling that your heart is beating very fast. Chest pain or discomfort. Feeling short of breath. Suddenly feeling light-headed or weak. Getting tired easily during activity. Fainting. Sweating. In some cases, there are no symptoms. How is this treated? Treatment for this condition depends on underlying conditions and how you feel when you have atrial fibrillation. They include: Medicines to: Prevent blood clots. Treat heart rate or heart rhythm problems. Using devices, such as a pacemaker, to correct heart rhythm problems. Doing surgery to remove the part of the heart that sends bad signals. Closing an area where clots can form in the heart (left atrial appendage). In some cases, your doctor will treat other underlying conditions. Follow these instructions at home: Medicines Take  over-the-counter and prescription medicines only as told by your doctor. Do not take any new medicines without first talking to your doctor. If you are taking blood thinners: Talk with your doctor before you take any medicines that have aspirin or NSAIDs, such as ibuprofen, in them. Take your medicine exactly as told by your doctor. Take it at the same time each day. Avoid activities that could hurt or bruise you. Follow instructions about how to prevent falls. Wear a bracelet that says you are taking blood thinners. Or, carry a card that lists what medicines you take. Lifestyle     Do not use any products that have nicotine or tobacco in them. These include cigarettes, e-cigarettes, and chewing tobacco. If you need help quitting, ask your doctor. Eat heart-healthy foods. Talk with your doctor about the right eating plan for you. Exercise regularly as told by your doctor. Do not drink alcohol. Lose weight if you are overweight. Do not use drugs, including cannabis. General instructions If you have a condition that causes breathing to stop for a short period of time (apnea), treat it as told by your doctor. Keep a healthy weight. Do not use diet pills unless your doctor says they are safe for you. Diet pills may make heart problems worse. Keep all follow-up visits as told by your doctor. This is important. Contact a doctor if: You notice a change in the speed, rhythm, or strength of your heartbeat. You are taking a blood-thinning medicine and you get more bruising. You get tired more easily when you move or exercise. You have a sudden change in weight. Get help right away if:  You have pain in   your chest or your belly (abdomen). You have trouble breathing. You have side effects of blood thinners, such as blood in your vomit, poop (stool), or pee (urine), or bleeding that cannot stop. You have any signs of a stroke. "BE FAST" is an easy way to remember the main warning signs: B -  Balance. Signs are dizziness, sudden trouble walking, or loss of balance. E - Eyes. Signs are trouble seeing or a change in how you see. F - Face. Signs are sudden weakness or loss of feeling in the face, or the face or eyelid drooping on one side. A - Arms. Signs are weakness or loss of feeling in an arm. This happens suddenly and usually on one side of the body. S - Speech. Signs are sudden trouble speaking, slurred speech, or trouble understanding what people say. T - Time. Time to call emergency services. Write down what time symptoms started. You have other signs of a stroke, such as: A sudden, very bad headache with no known cause. Feeling like you may vomit (nausea). Vomiting. A seizure. These symptoms may be an emergency. Do not wait to see if the symptoms will go away. Get medical help right away. Call your local emergency services (911 in the [place] Do not drive yourself to the hospital. Summary Atrial fibrillation is a type of heartbeat that is irregular or fast. You are at higher risk of this condition if you smoke, are older, have diabetes, or are overweight. Follow your doctor's instructions about medicines, diet, exercise, and follow-up visits. Get help right away if you have signs or symptoms of a stroke. Get help right away if you cannot catch your breath, or you have chest pain or discomfort. This information is not intended to replace advice given to you by your health care provider. Make sure you discuss any questions you have with your health care provider. Document Revised: 11/15/2018 Document Reviewed: 11/15/2018 Elsevier Patient Education  2023 Elsevier Inc.  

## 2021-12-07 ENCOUNTER — Encounter: Payer: Self-pay | Admitting: Nurse Practitioner

## 2021-12-07 ENCOUNTER — Ambulatory Visit (INDEPENDENT_AMBULATORY_CARE_PROVIDER_SITE_OTHER): Payer: Medicare HMO | Admitting: Nurse Practitioner

## 2021-12-07 VITALS — BP 119/70 | HR 51 | Temp 97.6°F | Wt 241.2 lb

## 2021-12-07 DIAGNOSIS — I480 Paroxysmal atrial fibrillation: Secondary | ICD-10-CM | POA: Diagnosis not present

## 2021-12-07 DIAGNOSIS — R0683 Snoring: Secondary | ICD-10-CM

## 2021-12-07 NOTE — Assessment & Plan Note (Signed)
Ongoing, stable at this time with regular rhythm on auscultation and brady.  She continues to be symptomatic.  Continue current medication regimen and adjust as needed.  Continue to collaborate with cardiology.  All questions answered.  Labs up to date.  Will have her return in November after he cardioversion (set for 03/22/22).

## 2021-12-07 NOTE — Progress Notes (Signed)
BP 119/70   Pulse (!) 51   Temp 97.6 F (36.4 C) (Oral)   Wt 241 lb 3.2 oz (109.4 kg)   SpO2 96%   BMI 38.93 kg/m    Subjective:    Patient ID: Erika Anderson, female    DOB: [birth year], 71 y.o.   MRN: [id number]  HPI: Erika Anderson is a 71 y.o. female  Chief Complaint  Patient presents with   Atrial Fibrillation    Patient is here for a follow up on A-Fib. Patient says she is having an upcoming procedure in October. Patient says when she was having recent check up visit, she would like to discuss the issues from previous lab work in regards to her white blood count.    ATRIAL FIBRILLATION Last visit with cardiology was on 11/25/21 and is planned for cardioversion on 03/22/22.  Saw pulmonary on 11/27/21 and is getting scheduled for sleep study to further assess -- is not scheduled as of yet.  Currently on Metoprolol and Eliquis.  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 Atrial fibrillation status: stable Satisfied with current treatment: yes  Medication side effects:  no Medication compliance: good compliance Etiology of atrial fibrillation: unknown Palpitations:  yes Chest pain:   improving Dyspnea on exertion:  yes -- even with walking up to a store Orthopnea:   restless feeling -- sleeps on 1 pillow, this is baseline Syncope:  no Edema:  no Ventricular rate control: B-blocker Anti-coagulation: long acting   Relevant past medical, surgical, family and social history reviewed and updated as indicated. Interim medical history since our last visit reviewed. Allergies and medications reviewed and updated.  Review of Systems  Constitutional:  Negative for activity change, appetite change, diaphoresis, fatigue and fever.  Respiratory:  Positive for shortness of breath (with activity). Negative for cough, chest tightness and wheezing.   Cardiovascular:  Positive for palpitations. Negative for chest pain (improved) and leg swelling.   Gastrointestinal: Negative.   Endocrine: Negative.   Neurological: Negative.   Psychiatric/Behavioral: Negative.      Per HPI unless specifically indicated above     Objective:    BP 119/70   Pulse (!) 51   Temp 97.6 F (36.4 C) (Oral)   Wt 241 lb 3.2 oz (109.4 kg)   SpO2 96%   BMI 38.93 kg/m   Wt Readings from Last 3 Encounters:  12/07/21 241 lb 3.2 oz (109.4 kg)  11/27/21 238 lb 9.6 oz (108.2 kg)  11/25/21 240 lb 9.6 oz (109.1 kg)    Physical Exam Vitals and nursing note reviewed.  Constitutional:      General: She is awake. She is not in acute distress.    Appearance: She is well-developed and well-groomed. She is obese. She is not ill-appearing or toxic-appearing.  HENT:     Head: Normocephalic.     Right Ear: Hearing normal.     Left Ear: Hearing normal.  Eyes:     General: Lids are normal.        Right eye: No discharge.        Left eye: No discharge.     Conjunctiva/sclera: Conjunctivae normal.     Pupils: Pupils are equal, round, and reactive to light.  Neck:     Thyroid: No thyromegaly.     Vascular: No carotid bruit.  Cardiovascular:     Rate and Rhythm: Regular rhythm. Bradycardia present.     Heart sounds: Normal heart sounds. No murmur  heard.    No gallop. No S3 or S4 sounds.  Pulmonary:     Effort: Pulmonary effort is normal. No accessory muscle usage or respiratory distress.     Breath sounds: Normal breath sounds.  Abdominal:     General: Bowel sounds are normal. There is no distension.     Palpations: Abdomen is soft.     Tenderness: There is no abdominal tenderness.  Musculoskeletal:     Cervical back: Normal range of motion and neck supple.     Right lower leg: Edema (trace) present.     Left lower leg: Edema (trace) present.  Lymphadenopathy:     Cervical: No cervical adenopathy.  Skin:    General: Skin is warm and dry.  Neurological:     Mental Status: She is alert and oriented to person, place, and time.  Psychiatric:         Attention and Perception: Attention normal.        Mood and Affect: Mood normal.        Speech: Speech normal.        Behavior: Behavior normal. Behavior is cooperative.        Thought Content: Thought content normal.    Results for orders placed or performed in visit on 10/15/21  ECHOCARDIOGRAM COMPLETE  Result Value Ref Range   AR max vel 2.38 cm2   AV Peak grad 5.6 mmHg   Ao pk vel 1.18 m/s   S' Lateral 4.00 cm   Area-P 1/2 5.16 cm2   AV Area VTI 2.41 cm2   AV Mean grad 3.0 mmHg   Single Plane A4C EF 48.8 %   Single Plane A2C EF 45.8 %   Calc EF 47.9 %   AV Area mean vel 2.33 cm2      Assessment & Plan:   Problem List Items Addressed This Visit       Cardiovascular and Mediastinum   Atrial fibrillation (HCC) - Primary    Ongoing, stable at this time with regular rhythm on auscultation and brady.  She continues to be symptomatic.  Continue current medication regimen and adjust as needed.  Continue to collaborate with cardiology.  All questions answered.  Labs up to date.  Will have her return in November after he cardioversion (set for 03/22/22).        Other   Snoring    Has been seen by pulmonary, is to be scheduled for sleep study.        Follow up plan: Return in about 4 months (around 04/14/2022) for HTN/HLD, RLS, OSA.

## 2021-12-07 NOTE — Assessment & Plan Note (Signed)
Has been seen by pulmonary, is to be scheduled for sleep study.

## 2022-01-01 ENCOUNTER — Ambulatory Visit (INDEPENDENT_AMBULATORY_CARE_PROVIDER_SITE_OTHER): Payer: Medicare HMO | Admitting: Nurse Practitioner

## 2022-01-01 ENCOUNTER — Encounter (INDEPENDENT_AMBULATORY_CARE_PROVIDER_SITE_OTHER): Payer: Self-pay | Admitting: Nurse Practitioner

## 2022-01-01 VITALS — BP 144/72 | HR 60 | Resp 16 | Wt 241.0 lb

## 2022-01-01 DIAGNOSIS — I83893 Varicose veins of bilateral lower extremities with other complications: Secondary | ICD-10-CM

## 2022-01-11 ENCOUNTER — Other Ambulatory Visit: Payer: Medicare HMO

## 2022-01-12 ENCOUNTER — Telehealth: Payer: Self-pay | Admitting: *Deleted

## 2022-01-12 ENCOUNTER — Encounter: Payer: Self-pay | Admitting: *Deleted

## 2022-01-12 DIAGNOSIS — I4819 Other persistent atrial fibrillation: Secondary | ICD-10-CM

## 2022-01-12 DIAGNOSIS — Z01818 Encounter for other preprocedural examination: Secondary | ICD-10-CM

## 2022-01-18 ENCOUNTER — Encounter (INDEPENDENT_AMBULATORY_CARE_PROVIDER_SITE_OTHER): Payer: Self-pay | Admitting: Nurse Practitioner

## 2022-01-18 NOTE — Progress Notes (Signed)
Subjective:    Patient ID: Erika Anderson, female    DOB: Jan 19, 1951, 71 y.o.   MRN: [id number] Chief Complaint  Patient presents with   New Patient (Initial Visit)    Ref Agbor-Etang varicose veins with both lower extremities    Patient is seen for evaluation of leg swelling. The patient first noticed the swelling remotely but is now concerned because of a significant increase in the overall edema. The swelling isn't associated with significant pain.  There has been an increasing amount of  discoloration noted by the patient. The patient notes that in the morning the legs are improved but they steadily worsened throughout the course of the day. Elevation seems to make the swelling of the legs better, dependency makes them much worse.   There is no history of ulcerations associated with the swelling.   The patient denies any recent changes in their medications.  The patient has not been wearing graduated compression.  The patient has no had any past angiography, interventions or vascular surgery.  The patient denies a history of DVT or PE. There is no prior history of phlebitis. There is no history of primary lymphedema.       Review of Systems  Cardiovascular:  Positive for leg swelling.  All other systems reviewed and are negative.      Objective:   Physical Exam Vitals reviewed.  HENT:     Head: Normocephalic.  Cardiovascular:     Rate and Rhythm: Normal rate.     Pulses: Normal pulses.  Pulmonary:     Effort: Pulmonary effort is normal.  Skin:    General: Skin is warm and dry.  Neurological:     Mental Status: She is alert and oriented to person, place, and time.  Psychiatric:        Mood and Affect: Mood normal.        Behavior: Behavior normal.        Thought Content: Thought content normal.        Judgment: Judgment normal.     BP (!) 144/72 (BP Location: Left Arm)   Pulse 60   Resp 16   Wt 241 lb (109.3 kg)   BMI 38.90 kg/m   Past Medical  History:  Diagnosis Date   Atrial fibrillation (HCC)    Hyperlipidemia    Obesity (BMI 30-39.9)    Restless legs syndrome (RLS) 12/28/2014   RLS (restless legs syndrome)     Social History   Socioeconomic History   Marital status: Married    Spouse name: Not on file   Number of children: Not on file   Years of education: 12   Highest education level: High school graduate  Occupational History   Occupation: retired  Tobacco Use   Smoking status: Never   Smokeless tobacco: Never  Vaping Use   Vaping Use: Never used  Substance and Sexual Activity   Alcohol use: No    Alcohol/week: 0.0 standard drinks of alcohol   Drug use: No   Sexual activity: Not Currently  Other Topics Concern   Not on file  Social History Narrative   Helps take care of grandchildren    Social Determinants of Health   Financial Resource Strain: Low Risk  (02/27/2021)   Overall Financial Resource Strain (CARDIA)    Difficulty of Paying Living Expenses: Not hard at all  Food Insecurity: No Food Insecurity (02/27/2021)   Hunger Vital Sign    Worried About Running Out of Food in  the Last Year: Never true    Coats in the Last Year: Never true  Transportation Needs: No Transportation Needs (02/27/2021)   PRAPARE - Hydrologist (Medical): No    Lack of Transportation (Non-Medical): No  Physical Activity: Sufficiently Active (02/27/2021)   Exercise Vital Sign    Days of Exercise per Week: 7 days    Minutes of Exercise per Session: 30 min  Stress: No Stress Concern Present (02/27/2021)   [place]    Feeling of Stress : Not at all  Social Connections: Moderately Integrated (02/27/2021)   Social Connection and Isolation Panel [NHANES]    Frequency of Communication with Friends and Family: More than three times a week    Frequency of Social Gatherings with Friends and Family: More than three times a week     Attends Religious Services: More than 4 times per year    Active Member of [employer] or Organizations: No    Attends Archivist Meetings: Never    Marital Status: Married  Human resources officer Violence: Not At Risk (02/27/2021)   Humiliation, Afraid, Rape, and Kick questionnaire    Fear of Current or Ex-Partner: No    Emotionally Abused: No    Physically Abused: No    Sexually Abused: No    Past Surgical History:  Procedure Laterality Date   COLONOSCOPY WITH PROPOFOL N/A 08/24/2021   Procedure: COLONOSCOPY WITH PROPOFOL;  Surgeon: Jonathon Bellows, MD;  [facility] ENDOSCOPY;  Service: Gastroenterology;  Laterality: N/A;   HEMORROIDECTOMY     TOTAL ABDOMINAL HYSTERECTOMY     ovaries remain    Family History  Problem Relation Age of Onset   Cancer Mother        ovarian   Dementia Father    Arthritis Sister    Heart disease Brother        MI   Stroke Paternal Grandmother    Cancer Paternal Grandfather        lung   Breast cancer Neg Hx     No Known Allergies     Latest Ref Rng & Units 10/09/2021    9:22 AM 10/02/2021    3:52 PM 09/25/2021    5:03 PM  CBC  WBC 3.4 - 10.8 x10E3/uL 10.1  12.4  14.6   Hemoglobin 11.1 - 15.9 g/dL 13.7  13.3  14.4   Hematocrit 34.0 - 46.6 % 40.0  40.1  42.5   Platelets 150 - 450 x10E3/uL 204  193  213       CMP     Component Value Date/Time   NA 141 10/09/2021 0922   K 4.6 10/09/2021 0922   CL 103 10/09/2021 0922   CO2 24 10/09/2021 0922   GLUCOSE 98 10/09/2021 0922   GLUCOSE 84 10/02/2021 1552   BUN 20 10/09/2021 0922   CREATININE 0.97 10/09/2021 0922   CALCIUM 9.5 10/09/2021 0922   PROT 6.6 07/30/2021 0857   ALBUMIN 4.3 07/30/2021 0857   AST 24 07/30/2021 0857   ALT 19 07/30/2021 0857   ALKPHOS 81 07/30/2021 0857   BILITOT 0.4 07/30/2021 0857   GFRNONAA >60 10/02/2021 1552   GFRAA 86 01/24/2020 0947     No results found.     Assessment & Plan:   1. Varicose veins of bilateral lower extremities with other  complications Recommend:  I have had a long discussion with the patient regarding swelling and why it  causes symptoms.  Patient will begin wearing graduated compression on a daily basis a prescription was given. The patient will  wear the stockings first thing in the morning and removing them in the evening. The patient is instructed specifically not to sleep in the stockings.   In addition, behavioral modification will be initiated.  This will include frequent elevation, use of over the counter pain medications and exercise such as walking.  Consideration for a lymph pump will also be made based upon the effectiveness of conservative therapy.  This would help to improve the edema control and prevent sequela such as ulcers and infections   Patient should undergo duplex ultrasound of the venous system to ensure that DVT or reflux is not present.  The patient will follow-up with me after the ultrasound.     Current Outpatient Medications on File Prior to Visit  Medication Sig Dispense Refill   apixaban (ELIQUIS) 5 MG TABS tablet Take 1 tablet (5 mg total) by mouth 2 (two) times daily. 60 tablet 4   Cholecalciferol (D3) 50 MCG (2000 UT) TABS Take 2,000 tablets by mouth daily.     citalopram (CELEXA) 20 MG tablet Take 1 tablet (20 mg total) by mouth daily. 90 tablet 4   Ferrous Sulfate (IRON) 325 (65 Fe) MG TABS Take 325 mg by mouth daily.     gabapentin (NEURONTIN) 300 MG capsule Take 2 capsules (600 mg total) by mouth at bedtime. Take 1 capsule by mouth every night. 180 capsule 4   magnesium oxide (MAG-OX) 400 MG tablet Take 400 mg by mouth daily.     methocarbamol (ROBAXIN) 500 MG tablet Take 250-500 mg by mouth at bedtime as needed.     metoprolol tartrate (LOPRESSOR) 50 MG tablet Take 1 tablet (50 mg total) by mouth 2 (two) times daily. 60 tablet 3   rOPINIRole (REQUIP) 1 MG tablet Take 1 tablet (1 mg total) by mouth 4 (four) times daily. 360 tablet 4   rosuvastatin (CRESTOR) 10 MG tablet  Take 1 tablet (10 mg total) by mouth daily. 90 tablet 3   traZODone (DESYREL) 50 MG tablet TAKE 1/2 TO 1 (ONE-HALF TO ONE) TABLET BY MOUTH AT BEDTIME AS NEEDED FOR SLEEP 90 tablet 0   zinc gluconate 50 MG tablet Take 50 mg by mouth daily.     No current facility-administered medications on file prior to visit.    There are no Patient Instructions on file for this visit. No follow-ups on file.   Kris Hartmann, NP

## 2022-01-19 DIAGNOSIS — M170 Bilateral primary osteoarthritis of knee: Secondary | ICD-10-CM | POA: Diagnosis not present

## 2022-01-19 DIAGNOSIS — M25562 Pain in left knee: Secondary | ICD-10-CM | POA: Diagnosis not present

## 2022-01-19 DIAGNOSIS — M25561 Pain in right knee: Secondary | ICD-10-CM | POA: Diagnosis not present

## 2022-01-24 DIAGNOSIS — E559 Vitamin D deficiency, unspecified: Secondary | ICD-10-CM | POA: Insufficient documentation

## 2022-01-24 NOTE — Patient Instructions (Signed)
Atrial Fibrillation  Atrial fibrillation is a type of heartbeat that is irregular or fast. If you have this condition, your heart beats without any order. This makes it hard for your heart to pump blood in a normal way. Atrial fibrillation may come and go, or it may become a long-lasting problem. If this condition is not treated, it can put you at higher risk for stroke, heart failure, and other heart problems. What are the causes? This condition may be caused by diseases that damage the heart. They include: High blood pressure. Heart failure. Heart valve disease. Heart surgery. Other causes include: Diabetes. Thyroid disease. Being overweight. Kidney disease. Sometimes the cause is not known. What increases the risk? You are more likely to develop this condition if: You are older. You smoke. You exercise often and very hard. You have a family history of this condition. You are a man. You use drugs. You drink a lot of alcohol. You have lung conditions, such as emphysema, pneumonia, or COPD. You have sleep apnea. What are the signs or symptoms? Common symptoms of this condition include: A feeling that your heart is beating very fast. Chest pain or discomfort. Feeling short of breath. Suddenly feeling light-headed or weak. Getting tired easily during activity. Fainting. Sweating. In some cases, there are no symptoms. How is this treated? Treatment for this condition depends on underlying conditions and how you feel when you have atrial fibrillation. They include: Medicines to: Prevent blood clots. Treat heart rate or heart rhythm problems. Using devices, such as a pacemaker, to correct heart rhythm problems. Doing surgery to remove the part of the heart that sends bad signals. Closing an area where clots can form in the heart (left atrial appendage). In some cases, your doctor will treat other underlying conditions. Follow these instructions at home: Medicines Take  over-the-counter and prescription medicines only as told by your doctor. Do not take any new medicines without first talking to your doctor. If you are taking blood thinners: Talk with your doctor before you take any medicines that have aspirin or NSAIDs, such as ibuprofen, in them. Take your medicine exactly as told by your doctor. Take it at the same time each day. Avoid activities that could hurt or bruise you. Follow instructions about how to prevent falls. Wear a bracelet that says you are taking blood thinners. Or, carry a card that lists what medicines you take. Lifestyle     Do not use any products that have nicotine or tobacco in them. These include cigarettes, e-cigarettes, and chewing tobacco. If you need help quitting, ask your doctor. Eat heart-healthy foods. Talk with your doctor about the right eating plan for you. Exercise regularly as told by your doctor. Do not drink alcohol. Lose weight if you are overweight. Do not use drugs, including cannabis. General instructions If you have a condition that causes breathing to stop for a short period of time (apnea), treat it as told by your doctor. Keep a healthy weight. Do not use diet pills unless your doctor says they are safe for you. Diet pills may make heart problems worse. Keep all follow-up visits as told by your doctor. This is important. Contact a doctor if: You notice a change in the speed, rhythm, or strength of your heartbeat. You are taking a blood-thinning medicine and you get more bruising. You get tired more easily when you move or exercise. You have a sudden change in weight. Get help right away if:  You have pain in   your chest or your belly (abdomen). You have trouble breathing. You have side effects of blood thinners, such as blood in your vomit, poop (stool), or pee (urine), or bleeding that cannot stop. You have any signs of a stroke. "BE FAST" is an easy way to remember the main warning signs: B -  Balance. Signs are dizziness, sudden trouble walking, or loss of balance. E - Eyes. Signs are trouble seeing or a change in how you see. F - Face. Signs are sudden weakness or loss of feeling in the face, or the face or eyelid drooping on one side. A - Arms. Signs are weakness or loss of feeling in an arm. This happens suddenly and usually on one side of the body. S - Speech. Signs are sudden trouble speaking, slurred speech, or trouble understanding what people say. T - Time. Time to call emergency services. Write down what time symptoms started. You have other signs of a stroke, such as: A sudden, very bad headache with no known cause. Feeling like you may vomit (nausea). Vomiting. A seizure. These symptoms may be an emergency. Do not wait to see if the symptoms will go away. Get medical help right away. Call your local emergency services (911 in the [place] Do not drive yourself to the hospital. Summary Atrial fibrillation is a type of heartbeat that is irregular or fast. You are at higher risk of this condition if you smoke, are older, have diabetes, or are overweight. Follow your doctor's instructions about medicines, diet, exercise, and follow-up visits. Get help right away if you have signs or symptoms of a stroke. Get help right away if you cannot catch your breath, or you have chest pain or discomfort. This information is not intended to replace advice given to you by your health care provider. Make sure you discuss any questions you have with your health care provider. Document Revised: 11/15/2018 Document Reviewed: 11/15/2018 Elsevier Patient Education  2023 Elsevier Inc.  

## 2022-01-27 ENCOUNTER — Ambulatory Visit (INDEPENDENT_AMBULATORY_CARE_PROVIDER_SITE_OTHER): Payer: Medicare HMO | Admitting: Nurse Practitioner

## 2022-01-27 ENCOUNTER — Encounter: Payer: Self-pay | Admitting: Nurse Practitioner

## 2022-01-27 VITALS — BP 116/56 | HR 55 | Temp 98.0°F | Ht 66.0 in | Wt 242.8 lb

## 2022-01-27 DIAGNOSIS — F5104 Psychophysiologic insomnia: Secondary | ICD-10-CM

## 2022-01-27 DIAGNOSIS — I700 Atherosclerosis of aorta: Secondary | ICD-10-CM | POA: Diagnosis not present

## 2022-01-27 DIAGNOSIS — E559 Vitamin D deficiency, unspecified: Secondary | ICD-10-CM

## 2022-01-27 DIAGNOSIS — I480 Paroxysmal atrial fibrillation: Secondary | ICD-10-CM

## 2022-01-27 DIAGNOSIS — L578 Other skin changes due to chronic exposure to nonionizing radiation: Secondary | ICD-10-CM | POA: Diagnosis not present

## 2022-01-27 DIAGNOSIS — L821 Other seborrheic keratosis: Secondary | ICD-10-CM | POA: Diagnosis not present

## 2022-01-27 DIAGNOSIS — N951 Menopausal and female climacteric states: Secondary | ICD-10-CM

## 2022-01-27 DIAGNOSIS — R69 Illness, unspecified: Secondary | ICD-10-CM | POA: Diagnosis not present

## 2022-01-27 DIAGNOSIS — Z6841 Body Mass Index (BMI) 40.0 and over, adult: Secondary | ICD-10-CM

## 2022-01-27 DIAGNOSIS — E782 Mixed hyperlipidemia: Secondary | ICD-10-CM | POA: Diagnosis not present

## 2022-01-27 DIAGNOSIS — R0683 Snoring: Secondary | ICD-10-CM

## 2022-01-27 DIAGNOSIS — G2581 Restless legs syndrome: Secondary | ICD-10-CM | POA: Diagnosis not present

## 2022-01-27 DIAGNOSIS — D225 Melanocytic nevi of trunk: Secondary | ICD-10-CM | POA: Diagnosis not present

## 2022-01-27 MED ORDER — GABAPENTIN 300 MG PO CAPS
600.0000 mg | ORAL_CAPSULE | Freq: Every day | ORAL | 4 refills | Status: DC
Start: 2022-01-27 — End: 2023-08-04

## 2022-01-27 MED ORDER — TRAZODONE HCL 50 MG PO TABS: ORAL_TABLET | ORAL | 4 refills | Status: DC

## 2022-01-27 NOTE — Assessment & Plan Note (Signed)
BMI 39.19.  Recommended eating smaller high protein, low fat meals more frequently and exercising 30 mins a day 5 times a week with a goal of 10-15lb weight loss in the next 3 months. Patient voiced their understanding and motivation to adhere to these recommendations.

## 2022-01-27 NOTE — Assessment & Plan Note (Signed)
Chronic, stable.  Recommend she only take Trazodone 1/2 a pill in evening as needed and continue to cut back.  Do not take unless needed.  She wishes to reduce to discontinue this.

## 2022-01-27 NOTE — Assessment & Plan Note (Signed)
Recommended eating smaller high protein, low fat meals more frequently and exercising 30 mins a day 5 times a week with a goal of 10-15lb weight loss in the next 3 months. Patient voiced their understanding and motivation to adhere to these recommendations.  

## 2022-01-27 NOTE — Assessment & Plan Note (Signed)
Ongoing, stable at this time with regular rhythm on auscultation.  She continues to be symptomatic with mild SOB.  Continue current medication regimen and adjust as needed.  Continue to collaborate with cardiology.  All questions answered.  Labs today.

## 2022-01-27 NOTE — Progress Notes (Signed)
BP (!) 116/56   Pulse (!) 55   Temp 98 F (36.7 C) (Oral)   Ht '5\' 6"'$  (1.676 m)   Wt 242 lb 12.8 oz (110.1 kg)   SpO2 96%   BMI 39.19 kg/m    Subjective:    Patient ID: Erika Anderson, female    DOB: 02/29/1951, 71 y.o.   MRN: [id number]  HPI: Erika Anderson is a 71 y.o. female  Chief Complaint  Patient presents with   RLS   Hyperlipidemia   Atrial Fibrillation    Patient says everything is going good. Patient says the current medication she is taking is working well, but she does still experience some shortness of breath with walking.    HYPERLIPIDEMIA Continues on Rosuvastatin. Satisfied with current treatment? yes Duration of hyperlipidemia: chronic Cholesterol medication side effects: no Cholesterol supplements: none Medication compliance: good compliance Aspirin: no Recent stressors: no Recurrent headaches: no Visual changes: no Palpitations: no Dyspnea: improving Chest pain: no Lower extremity edema: yes Dizzy/lightheaded: no   ATRIAL FIBRILLATION Currently on Metoprolol and Eliquis.  Last visit with cardiology was on 11/25/21.  Ablation planned for 10/12/2.  Saw pulmonary on 11/27/21 -- sleep study scheduled for end of this month.  Saw vascular for varicose veins on 01/01/22 -- is scheduled for imaging and then plan from there. Atrial fibrillation status: stable Satisfied with current treatment: yes  Medication side effects:  no Medication compliance: good compliance Etiology of atrial fibrillation: unknown Palpitations:  yes Chest pain: no Dyspnea on exertion:  present but improving Orthopnea: no Syncope:  no Edema:  yes Ventricular rate control: B-blocker Anti-coagulation: long acting   RESTLESS LEGS Continues on Requip 1 MG QID (on max dose).  Taking Gabapentin 600 MG at night for back pain. Duration: chronic Discomfort description:  ill-defined Pain: no Location: lower legs Bilateral: yes Symmetric: yes Severity: mild Onset:   gradual Frequency:  intermittent Symptoms only occur while legs at rest: yes Sudden unintentional leg jerking: yes Bed partner bothered by leg movements: no LE numbness: no Decreased sensation: no Weakness: no Insomnia: yes Daytime somnolence: no Fatigue: no Alleviating factors: Requip Aggravating factors: rest Status: stable Treatments attempted:  as above  MENOPAUSAL SYMPTOMS AND INSOMNIA Continues on Celexa 20 MG daily and Trazodone at night as needed only.  Has lots going on in life at this time. Mood status: stable Satisfied with current treatment?: yes Symptom severity: mild  Duration of current treatment : chronic Side effects: no Medication compliance: good compliance Psychotherapy/counseling: none Depressed mood: yes Anxious mood: no Anhedonia: no Significant weight loss or gain: no Insomnia: occasional Fatigue: yes Feelings of worthlessness or guilt: no Impaired concentration/indecisiveness: no Suicidal ideations: no Hopelessness: no Crying spells: no    01/27/2022    8:42 AM 12/07/2021    9:18 AM 07/30/2021    9:10 AM 02/27/2021    6:00 PM 07/28/2020    9:03 AM  Depression screen PHQ 2/9  Decreased Interest 0 0 0 0 0  Down, Depressed, Hopeless 0 0 0 0 0  PHQ - 2 Score 0 0 0 0 0  Altered sleeping '3 3 1  '$ 0  Tired, decreased energy '2 2 1  '$ 0  Change in appetite 1  1  0  Feeling bad or failure about yourself  0 0 0  0  Trouble concentrating 0 0 0  0  Moving slowly or fidgety/restless 1 0 0  0  Suicidal thoughts 0 0 0  0  PHQ-9 Score  $'7 5 3  'o$ 0  Difficult doing work/chores Not difficult at all Somewhat difficult Not difficult at all  Not difficult at all       01/27/2022    8:42 AM 12/07/2021    9:19 AM 01/19/2019   10:00 AM  GAD 7 : Generalized Anxiety Score  Nervous, Anxious, on Edge 0 0 0  Control/stop worrying 0 0 0  Worry too much - different things 0 1 0  Trouble relaxing 0 1 0  Restless 0 1 0  Easily annoyed or irritable 0 0 0  Afraid - awful might  happen 0 0 0  Total GAD 7 Score 0 3 0  Anxiety Difficulty Not difficult at all Not difficult at all      Relevant past medical, surgical, family and social history reviewed and updated as indicated. Interim medical history since our last visit reviewed. Allergies and medications reviewed and updated.  Review of Systems  Constitutional:  Negative for activity change, appetite change, diaphoresis, fatigue and fever.  Respiratory:  Positive for shortness of breath (with activity). Negative for cough, chest tightness and wheezing.   Cardiovascular:  Positive for palpitations. Negative for chest pain (improved) and leg swelling.  Gastrointestinal: Negative.   Endocrine: Negative.   Neurological: Negative.   Psychiatric/Behavioral: Negative.      Per HPI unless specifically indicated above     Objective:    BP (!) 116/56   Pulse (!) 55   Temp 98 F (36.7 C) (Oral)   Ht '5\' 6"'$  (1.676 m)   Wt 242 lb 12.8 oz (110.1 kg)   SpO2 96%   BMI 39.19 kg/m   Wt Readings from Last 3 Encounters:  01/27/22 242 lb 12.8 oz (110.1 kg)  01/01/22 241 lb (109.3 kg)  12/07/21 241 lb 3.2 oz (109.4 kg)    Physical Exam Vitals and nursing note reviewed.  Constitutional:      General: She is awake. She is not in acute distress.    Appearance: She is well-developed and well-groomed. She is obese. She is not ill-appearing or toxic-appearing.  HENT:     Head: Normocephalic.     Right Ear: Hearing normal.     Left Ear: Hearing normal.  Eyes:     General: Lids are normal.        Right eye: No discharge.        Left eye: No discharge.     Conjunctiva/sclera: Conjunctivae normal.     Pupils: Pupils are equal, round, and reactive to light.  Neck:     Thyroid: No thyromegaly.     Vascular: No carotid bruit.  Cardiovascular:     Rate and Rhythm: Regular rhythm. Bradycardia present.     Heart sounds: Normal heart sounds. No murmur heard.    No gallop. No S3 or S4 sounds.  Pulmonary:     Effort:  Pulmonary effort is normal. No accessory muscle usage or respiratory distress.     Breath sounds: Normal breath sounds.  Abdominal:     General: Bowel sounds are normal. There is no distension.     Palpations: Abdomen is soft.     Tenderness: There is no abdominal tenderness.  Musculoskeletal:     Cervical back: Normal range of motion and neck supple.     Right lower leg: Edema (trace) present.     Left lower leg: Edema (trace) present.  Lymphadenopathy:     Cervical: No cervical adenopathy.  Skin:    General: Skin is warm  and dry.  Neurological:     Mental Status: She is alert and oriented to person, place, and time.  Psychiatric:        Attention and Perception: Attention normal.        Mood and Affect: Mood normal.        Speech: Speech normal.        Behavior: Behavior normal. Behavior is cooperative.        Thought Content: Thought content normal.    Results for orders placed or performed in visit on 10/15/21  ECHOCARDIOGRAM COMPLETE  Result Value Ref Range   AR max vel 2.38 cm2   AV Peak grad 5.6 mmHg   Ao pk vel 1.18 m/s   S' Lateral 4.00 cm   Area-P 1/2 5.16 cm2   AV Area VTI 2.41 cm2   AV Mean grad 3.0 mmHg   Single Plane A4C EF 48.8 %   Single Plane A2C EF 45.8 %   Calc EF 47.9 %   AV Area mean vel 2.33 cm2      Assessment & Plan:   Problem List Items Addressed This Visit       Cardiovascular and Mediastinum   Aortic atherosclerosis (HCC)   Relevant Orders   Comprehensive metabolic panel   Lipid Panel w/o Chol/HDL Ratio   Atrial fibrillation (HCC) - Primary    Ongoing, stable at this time with regular rhythm on auscultation.  She continues to be symptomatic with mild SOB.  Continue current medication regimen and adjust as needed.  Continue to collaborate with cardiology.  All questions answered.  Labs today.        Relevant Orders   CBC with Differential/Platelet   Comprehensive metabolic panel   Lipid Panel w/o Chol/HDL Ratio     Other   BMI  40.0-44.9, adult (HCC)    Recommended eating smaller high protein, low fat meals more frequently and exercising 30 mins a day 5 times a week with a goal of 10-15lb weight loss in the next 3 months. Patient voiced their understanding and motivation to adhere to these recommendations.       Hyperlipidemia    Chronic, ongoing.  Continue current medication regimen and adjust as needed.  Lipid panel today.      Relevant Orders   Comprehensive metabolic panel   Lipid Panel w/o Chol/HDL Ratio   Insomnia    Chronic, stable.  Recommend she only take Trazodone 1/2 a pill in evening as needed and continue to cut back.  Do not take unless needed.  She wishes to reduce to discontinue this.      Menopausal vasomotor syndrome    Improved with Celexa 20 MG, this is max dose for age.  Continue current regimen and adjust as needed.  Would avoid HRT due to maternal history of ovarian cancer and heart issues.        Morbid obesity (HCC)    BMI 39.19.  Recommended eating smaller high protein, low fat meals more frequently and exercising 30 mins a day 5 times a week with a goal of 10-15lb weight loss in the next 3 months. Patient voiced their understanding and motivation to adhere to these recommendations.       Restless legs syndrome (RLS)    Chronic and stable with Requip and Gabapentin, continue this regimen and adjust as needed.  Monitor kidney function closely with medications.  Labs today: Mag, CMP, CBC.      Relevant Orders   CBC with Differential/Platelet  Magnesium   Vitamin D deficiency    Noted on past labs, recommend taking Vitamin D3 2000 units daily, check level today.      Relevant Orders   VITAMIN D 25 Hydroxy (Vit-D Deficiency, Fractures)     Follow up plan: Return in about 6 months (around 07/30/2022) for Annual Physical after 07/30/22.

## 2022-01-27 NOTE — Assessment & Plan Note (Signed)
Chronic and stable with Requip and Gabapentin, continue this regimen and adjust as needed.  Monitor kidney function closely with medications.  Labs today: Mag, CMP, CBC.

## 2022-01-27 NOTE — Assessment & Plan Note (Signed)
Chronic, ongoing.  Continue current medication regimen and adjust as needed. Lipid panel today. 

## 2022-01-27 NOTE — Assessment & Plan Note (Signed)
Improved with Celexa 20 MG, this is max dose for age.  Continue current regimen and adjust as needed.  Would avoid HRT due to maternal history of ovarian cancer and heart issues.

## 2022-01-27 NOTE — Assessment & Plan Note (Signed)
Noted on past labs, recommend taking Vitamin D3 2000 units daily, check level today. 

## 2022-01-28 ENCOUNTER — Ambulatory Visit
Admission: RE | Admit: 2022-01-28 | Discharge: 2022-01-28 | Disposition: A | Discharge status: Home / Self Care | Payer: Medicare HMO | Source: Ambulatory Visit | Attending: Nurse Practitioner | Admitting: Nurse Practitioner

## 2022-01-28 ENCOUNTER — Other Ambulatory Visit: Payer: Self-pay | Admitting: Nurse Practitioner

## 2022-01-28 DIAGNOSIS — Z1231 Encounter for screening mammogram for malignant neoplasm of breast: Secondary | ICD-10-CM | POA: Diagnosis not present

## 2022-01-28 DIAGNOSIS — Z780 Asymptomatic menopausal state: Secondary | ICD-10-CM | POA: Insufficient documentation

## 2022-01-28 DIAGNOSIS — Z13820 Encounter for screening for osteoporosis: Secondary | ICD-10-CM | POA: Diagnosis not present

## 2022-01-28 DIAGNOSIS — Z8781 Personal history of (healed) traumatic fracture: Secondary | ICD-10-CM | POA: Insufficient documentation

## 2022-01-28 DIAGNOSIS — D72820 Lymphocytosis (symptomatic): Secondary | ICD-10-CM

## 2022-01-28 LAB — CBC WITH DIFFERENTIAL/PLATELET
Basophils Absolute: 0.1 10*3/uL (ref 0.0–0.2)
Basos: 0 %
EOS (ABSOLUTE): 0.2 10*3/uL (ref 0.0–0.4)
Eos: 1 %
Hematocrit: 42.6 % (ref 34.0–46.6)
Hemoglobin: 14.2 g/dL (ref 11.1–15.9)
Immature Grans (Abs): 0 10*3/uL (ref 0.0–0.1)
Immature Granulocytes: 0 %
Lymphocytes Absolute: 11.1 10*3/uL — ABNORMAL HIGH (ref 0.7–3.1)
Lymphs: 62 %
MCH: 31 pg (ref 26.6–33.0)
MCHC: 33.3 g/dL (ref 31.5–35.7)
MCV: 93 fL (ref 79–97)
Monocytes Absolute: 1 10*3/uL — ABNORMAL HIGH (ref 0.1–0.9)
Monocytes: 6 %
Neutrophils Absolute: 5.6 10*3/uL (ref 1.4–7.0)
Neutrophils: 31 %
Platelets: 206 10*3/uL (ref 150–450)
RBC: 4.58 x10E6/uL (ref 3.77–5.28)
RDW: 13.2 % (ref 11.7–15.4)
WBC: 18 10*3/uL — ABNORMAL HIGH (ref 3.4–10.8)

## 2022-01-28 LAB — COMPREHENSIVE METABOLIC PANEL
ALT: 23 IU/L (ref 0–32)
AST: 22 IU/L (ref 0–40)
Albumin/Globulin Ratio: 2.1 (ref 1.2–2.2)
Albumin: 4.4 g/dL (ref 3.9–4.9)
Alkaline Phosphatase: 91 IU/L (ref 44–121)
BUN/Creatinine Ratio: 24 (ref 12–28)
BUN: 22 mg/dL (ref 8–27)
Bilirubin Total: 0.4 mg/dL (ref 0.0–1.2)
CO2: 24 mmol/L (ref 20–29)
Calcium: 9.6 mg/dL (ref 8.7–10.3)
Chloride: 102 mmol/L (ref 96–106)
Creatinine, Ser: 0.91 mg/dL (ref 0.57–1.00)
Globulin, Total: 2.1 g/dL (ref 1.5–4.5)
Glucose: 82 mg/dL (ref 70–99)
Potassium: 4.3 mmol/L (ref 3.5–5.2)
Sodium: 142 mmol/L (ref 134–144)
Total Protein: 6.5 g/dL (ref 6.0–8.5)
eGFR: 68 mL/min/{1.73_m2} (ref >59)

## 2022-01-28 LAB — LIPID PANEL W/O CHOL/HDL RATIO
Cholesterol, Total: 170 mg/dL (ref 100–199)
HDL: 56 mg/dL (ref >39)
LDL Chol Calc (NIH): 82 mg/dL (ref 0–99)
Triglycerides: 193 mg/dL — ABNORMAL HIGH (ref 0–149)
VLDL Cholesterol Cal: 32 mg/dL (ref 5–40)

## 2022-01-28 LAB — MAGNESIUM: Magnesium: 2 mg/dL (ref 1.6–2.3)

## 2022-01-28 LAB — VITAMIN D 25 HYDROXY (VIT D DEFICIENCY, FRACTURES): Vit D, 25-Hydroxy: 65.4 ng/mL (ref 30.0–100.0)

## 2022-01-28 NOTE — Progress Notes (Signed)
Contacted via [place] -- lab visit needed in 3 weeks please:)  Good day Erika Anderson, your labs have returned: - Cholesterol levels show stable LDL -- continue Rosuvastatin, we may increase this to 20 MG next visit to get LDL a little bit lower. - CBC shows some elevation in white blood cell count and neutrophils, this is likely from your recent steroid shot.  I would like to recheck this on outpatient labs in 3 weeks please.  Staff will call to schedule.   - Remainder of labs are stable.  Any questions? Keep being amazing!!  Thank you for allowing me to participate in your care.  I appreciate you. Kindest regards, Arek Spadafore

## 2022-01-28 NOTE — Progress Notes (Signed)
Contacted via [place] morning Erika Anderson, your imaging returned and bone density is normal!!  [employer] [employer]!!

## 2022-01-29 NOTE — Progress Notes (Signed)
Contacted via MyChart   Normal mammogram, may repeat in one year:)

## 2022-02-01 ENCOUNTER — Ambulatory Visit: Payer: Medicare HMO

## 2022-02-01 ENCOUNTER — Encounter: Payer: Self-pay | Admitting: Nurse Practitioner

## 2022-02-01 NOTE — Telephone Encounter (Signed)
Pt scheduled tomorrow virtually with Dr. Wynetta Emery

## 2022-02-02 ENCOUNTER — Encounter: Payer: Self-pay | Admitting: Family Medicine

## 2022-02-02 ENCOUNTER — Telehealth (INDEPENDENT_AMBULATORY_CARE_PROVIDER_SITE_OTHER): Payer: Medicare HMO | Admitting: Family Medicine

## 2022-02-02 DIAGNOSIS — J069 Acute upper respiratory infection, unspecified: Secondary | ICD-10-CM | POA: Diagnosis not present

## 2022-02-02 MED ORDER — HYDROCOD POLI-CHLORPHE POLI ER 10-8 MG/5ML PO SUER: 5.0000 mL | Freq: Two times a day (BID) | ORAL | 0 refills | Status: DC | PRN

## 2022-02-02 MED ORDER — PREDNISONE 50 MG PO TABS: 50.0000 mg | ORAL_TABLET | Freq: Every day | ORAL | 0 refills | Status: DC

## 2022-02-02 NOTE — Progress Notes (Signed)
There were no vitals taken for this visit.   Subjective:    Patient ID: Erika Anderson, female    DOB: 07/29/1950, 71 y.o.   MRN: [id number]  HPI: Erika Anderson is a 71 y.o. female  Chief Complaint  Patient presents with   Cough    Patient states she has been coughing since the weekend, coughing up a lot of phlegm. Per patient she is also having a runny nose.    UPPER RESPIRATORY TRACT INFECTION Duration: 3 days Worst symptom: cough and congestion Fever: subjective Cough: yes Shortness of breath: no Wheezing: yes Chest pain: no Chest tightness: no Chest congestion: yes Nasal congestion: yes Runny nose: no Post nasal drip: yes Sneezing: no Sore throat: no Swollen glands: no Sinus pressure: no Headache: no Face pain: no Toothache: no Ear pain: no  Ear pressure: no  Eyes red/itching:no Eye drainage/crusting: no  Vomiting: no Rash: no Fatigue: yes Sick contacts: no Strep contacts: no  Context: worse Recurrent sinusitis: no Relief with OTC cold/cough medications: no  Treatments attempted: cough syrup   Relevant past medical, surgical, family and social history reviewed and updated as indicated. Interim medical history since our last visit reviewed. Allergies and medications reviewed and updated.  Review of Systems  Constitutional:  Positive for chills, diaphoresis, fatigue and fever. Negative for activity change, appetite change and unexpected weight change.  HENT:  Positive for congestion, postnasal drip, rhinorrhea, sinus pressure and voice change. Negative for dental problem, drooling, ear discharge, ear pain, facial swelling, hearing loss, mouth sores, nosebleeds, sinus pain, sneezing, sore throat, tinnitus and trouble swallowing.   Eyes: Negative.   Respiratory:  Positive for cough and wheezing. Negative for apnea, choking, chest tightness, shortness of breath and stridor.   Cardiovascular: Negative.   Gastrointestinal: Negative.   Musculoskeletal:  Negative.   Psychiatric/Behavioral: Negative.      Per HPI unless specifically indicated above     Objective:    There were no vitals taken for this visit.  Wt Readings from Last 3 Encounters:  01/27/22 242 lb 12.8 oz (110.1 kg)  01/01/22 241 lb (109.3 kg)  12/07/21 241 lb 3.2 oz (109.4 kg)    Physical Exam Vitals and nursing note reviewed.  Pulmonary:     Effort: Pulmonary effort is normal. No respiratory distress.     Comments: Speaking in full sentences Neurological:     Mental Status: She is alert.  Psychiatric:        Mood and Affect: Mood normal.        Behavior: Behavior normal.        Thought Content: Thought content normal.        Judgment: Judgment normal.     Results for orders placed or performed in visit on 01/27/22  CBC with Differential/Platelet  Result Value Ref Range   WBC 18.0 (H) 3.4 - 10.8 x10E3/uL   RBC 4.58 3.77 - 5.28 x10E6/uL   Hemoglobin 14.2 11.1 - 15.9 g/dL   Hematocrit 42.6 34.0 - 46.6 %   MCV 93 79 - 97 fL   MCH 31.0 26.6 - 33.0 pg   MCHC 33.3 31.5 - 35.7 g/dL   RDW 13.2 11.7 - 15.4 %   Platelets 206 150 - 450 x10E3/uL   Neutrophils 31 Not Estab. %   Lymphs 62 Not Estab. %   Monocytes 6 Not Estab. %   Eos 1 Not Estab. %   Basos 0 Not Estab. %   Neutrophils Absolute 5.6 1.4 -  7.0 x10E3/uL   Lymphocytes Absolute 11.1 (H) 0.7 - 3.1 x10E3/uL   Monocytes Absolute 1.0 (H) 0.1 - 0.9 x10E3/uL   EOS (ABSOLUTE) 0.2 0.0 - 0.4 x10E3/uL   Basophils Absolute 0.1 0.0 - 0.2 x10E3/uL   Immature Granulocytes 0 Not Estab. %   Immature Grans (Abs) 0.0 0.0 - 0.1 x10E3/uL  Comprehensive metabolic panel  Result Value Ref Range   Glucose 82 70 - 99 mg/dL   BUN 22 8 - 27 mg/dL   Creatinine, Ser 0.91 0.57 - 1.00 mg/dL   eGFR 68 >59 mL/min/1.73   BUN/Creatinine Ratio 24 12 - 28   Sodium 142 134 - 144 mmol/L   Potassium 4.3 3.5 - 5.2 mmol/L   Chloride 102 96 - 106 mmol/L   CO2 24 20 - 29 mmol/L   Calcium 9.6 8.7 - 10.3 mg/dL   Total Protein 6.5 6.0 -  8.5 g/dL   Albumin 4.4 3.9 - 4.9 g/dL   Globulin, Total 2.1 1.5 - 4.5 g/dL   Albumin/Globulin Ratio 2.1 1.2 - 2.2   Bilirubin Total 0.4 0.0 - 1.2 mg/dL   Alkaline Phosphatase 91 44 - 121 IU/L   AST 22 0 - 40 IU/L   ALT 23 0 - 32 IU/L  Lipid Panel w/o Chol/HDL Ratio  Result Value Ref Range   Cholesterol, Total 170 100 - 199 mg/dL   Triglycerides 193 (H) 0 - 149 mg/dL   HDL 56 >39 mg/dL   VLDL Cholesterol Cal 32 5 - 40 mg/dL   LDL Chol Calc (NIH) 82 0 - 99 mg/dL  VITAMIN D 25 Hydroxy (Vit-D Deficiency, Fractures)  Result Value Ref Range   Vit D, 25-Hydroxy 65.4 30.0 - 100.0 ng/mL  Magnesium  Result Value Ref Range   Magnesium 2.0 1.6 - 2.3 mg/dL      Assessment & Plan:   Problem List Items Addressed This Visit   None Visit Diagnoses     Upper respiratory tract infection, unspecified type    -  Primary   Will check fot strep, flu and COVID. Treat symptomatically with steroids and tussionex. Call if not getting better or getting worse. Await results.   Relevant Orders   Novel Coronavirus, NAA ([facility])   Rapid Strep screen([facility])   Influenza a and b        Follow up plan: Return if symptoms worsen or fail to improve.   This visit was completed via telephone due to the restrictions of the COVID-19 pandemic. All issues as above were discussed and addressed but no physical exam was performed. If it was felt that the patient should be evaluated in the office, they were directed there. The patient verbally consented to this visit. Patient was unable to complete an audio/visual visit due to Lack of equipment. Due to the catastrophic nature of the COVID-19 pandemic, this visit was done through audio contact only. Location of the patient: home Location of the provider: work Those involved with this call:  Provider: Park Liter, DO CMA: Louanna Raw, CMA Front Desk/Registration: [employer]  Time spent on call:  21 minutes on the phone discussing health concerns.  25 minutes total spent in review of patient's record and preparation of their chart.

## 2022-02-03 ENCOUNTER — Other Ambulatory Visit (INDEPENDENT_AMBULATORY_CARE_PROVIDER_SITE_OTHER): Payer: Self-pay | Admitting: Nurse Practitioner

## 2022-02-03 DIAGNOSIS — I8393 Asymptomatic varicose veins of bilateral lower extremities: Secondary | ICD-10-CM

## 2022-02-04 LAB — NOVEL CORONAVIRUS, NAA: SARS-CoV-2, NAA: NOT DETECTED

## 2022-02-05 ENCOUNTER — Ambulatory Visit (INDEPENDENT_AMBULATORY_CARE_PROVIDER_SITE_OTHER): Payer: Medicare HMO

## 2022-02-05 ENCOUNTER — Encounter (INDEPENDENT_AMBULATORY_CARE_PROVIDER_SITE_OTHER): Payer: Self-pay | Admitting: Nurse Practitioner

## 2022-02-05 ENCOUNTER — Ambulatory Visit (INDEPENDENT_AMBULATORY_CARE_PROVIDER_SITE_OTHER): Payer: Medicare HMO | Admitting: Nurse Practitioner

## 2022-02-05 VITALS — BP 122/72 | HR 53 | Resp 18 | Ht 66.0 in | Wt 247.6 lb

## 2022-02-05 DIAGNOSIS — I8393 Asymptomatic varicose veins of bilateral lower extremities: Secondary | ICD-10-CM

## 2022-02-05 DIAGNOSIS — I890 Lymphedema, not elsewhere classified: Secondary | ICD-10-CM

## 2022-02-05 DIAGNOSIS — E785 Hyperlipidemia, unspecified: Secondary | ICD-10-CM

## 2022-02-05 LAB — RAPID STREP SCREEN (MED CTR MEBANE ONLY): Strep Gp A Ag, IA W/Reflex: NEGATIVE

## 2022-02-05 LAB — CULTURE, GROUP A STREP: Strep A Culture: NEGATIVE

## 2022-02-05 LAB — VERITOR FLU A/B WAIVED
Influenza A: NEGATIVE
Influenza B: NEGATIVE

## 2022-02-06 ENCOUNTER — Encounter (INDEPENDENT_AMBULATORY_CARE_PROVIDER_SITE_OTHER): Payer: Self-pay | Admitting: Nurse Practitioner

## 2022-02-06 NOTE — Progress Notes (Addendum)
Subjective:    Patient ID: Erika Anderson, female    DOB: 1951/03/23, 71 y.o.   MRN: [id number] No chief complaint on file.   The patient returns to the office for followup evaluation regarding leg swelling.  The swelling has improved quite a bit and the pain associated with swelling has decreased substantially. There have not been any interval development of a ulcerations or wounds.  Since the previous visit the patient has been wearing graduated compression stockings and has noted some improvement in the lymphedema. The patient has been using compression routinely morning until night.  The patient also states elevation during the day and exercise (such as walking) is being done too.   Today noninvasive studies show no evidence of DVT or superficial thrombophlebitis in the bilateral lower extremities.  No evidence of deep venous insufficiency bilaterally.    Review of Systems  Cardiovascular:  Positive for leg swelling.  All other systems reviewed and are negative.      Objective:   Physical Exam Vitals reviewed.  HENT:     Head: Normocephalic.  Cardiovascular:     Rate and Rhythm: Normal rate.  Pulmonary:     Effort: Pulmonary effort is normal.  Musculoskeletal:     Right lower leg: Edema present.     Left lower leg: Edema present.  Skin:    General: Skin is warm and dry.     Comments: Hyperpigmentation bilaterally  Neurological:     Mental Status: She is alert and oriented to person, place, and time.  Psychiatric:        Mood and Affect: Mood normal.        Behavior: Behavior normal.        Thought Content: Thought content normal.        Judgment: Judgment normal.     BP 122/72 (BP Location: Right Arm)   Pulse (!) 53   Resp 18   Ht '5\' 6"'$  (1.676 m)   Wt 247 lb 9.6 oz (112.3 kg)   BMI 39.96 kg/m   Past Medical History:  Diagnosis Date   Atrial fibrillation (HCC)    Hyperlipidemia    Obesity (BMI 30-39.9)    Restless legs syndrome (RLS) 12/28/2014    RLS (restless legs syndrome)     Social History   Socioeconomic History   Marital status: Married    Spouse name: Not on file   Number of children: Not on file   Years of education: 12   Highest education level: High school graduate  Occupational History   Occupation: retired  Tobacco Use   Smoking status: Never   Smokeless tobacco: Never  Vaping Use   Vaping Use: Never used  Substance and Sexual Activity   Alcohol use: No    Alcohol/week: 0.0 standard drinks of alcohol   Drug use: No   Sexual activity: Not Currently  Other Topics Concern   Not on file  Social History Narrative   Helps take care of grandchildren    Social Determinants of Health   Financial Resource Strain: Low Risk  (02/27/2021)   Overall Financial Resource Strain (CARDIA)    Difficulty of Paying Living Expenses: Not hard at all  Food Insecurity: No Food Insecurity (02/27/2021)   Hunger Vital Sign    Worried About Running Out of Food in the Last Year: Never true    [place] in the Last Year: Never true  Transportation Needs: No Transportation Needs (02/27/2021)   [place] - Transportation  Lack of Transportation (Medical): No    Lack of Transportation (Non-Medical): No  Physical Activity: Sufficiently Active (02/27/2021)   Exercise Vital Sign    Days of Exercise per Week: 7 days    Minutes of Exercise per Session: 30 min  Stress: No Stress Concern Present (02/27/2021)   [place]    Feeling of Stress : Not at all  Social Connections: Moderately Integrated (02/27/2021)   Social Connection and Isolation Panel [NHANES]    Frequency of Communication with Friends and Family: More than three times a week    Frequency of Social Gatherings with Friends and Family: More than three times a week    Attends Religious Services: More than 4 times per year    Active Member of [employer] or Organizations: No    Attends Archivist Meetings:  Never    Marital Status: Married  Human resources officer Violence: Not At Risk (02/27/2021)   Humiliation, Afraid, Rape, and Kick questionnaire    Fear of Current or Ex-Partner: No    Emotionally Abused: No    Physically Abused: No    Sexually Abused: No    Past Surgical History:  Procedure Laterality Date   COLONOSCOPY WITH PROPOFOL N/A 08/24/2021   Procedure: COLONOSCOPY WITH PROPOFOL;  Surgeon: Jonathon Bellows, MD;  [facility] ENDOSCOPY;  Service: Gastroenterology;  Laterality: N/A;   HEMORROIDECTOMY     TOTAL ABDOMINAL HYSTERECTOMY     ovaries remain    Family History  Problem Relation Age of Onset   Cancer Mother        ovarian   Dementia Father    Arthritis Sister    Heart disease Brother        MI   Stroke Paternal Grandmother    Cancer Paternal Grandfather        lung   Breast cancer Neg Hx     No Known Allergies     Latest Ref Rng & Units 01/27/2022    9:05 AM 10/09/2021    9:22 AM 10/02/2021    3:52 PM  CBC  WBC 3.4 - 10.8 x10E3/uL 18.0  10.1  12.4   Hemoglobin 11.1 - 15.9 g/dL 14.2  13.7  13.3   Hematocrit 34.0 - 46.6 % 42.6  40.0  40.1   Platelets 150 - 450 x10E3/uL 206  204  193       CMP     Component Value Date/Time   NA 142 01/27/2022 0905   K 4.3 01/27/2022 0905   CL 102 01/27/2022 0905   CO2 24 01/27/2022 0905   GLUCOSE 82 01/27/2022 0905   GLUCOSE 84 10/02/2021 1552   BUN 22 01/27/2022 0905   CREATININE 0.91 01/27/2022 0905   CALCIUM 9.6 01/27/2022 0905   PROT 6.5 01/27/2022 0905   ALBUMIN 4.4 01/27/2022 0905   AST 22 01/27/2022 0905   ALT 23 01/27/2022 0905   ALKPHOS 91 01/27/2022 0905   BILITOT 0.4 01/27/2022 0905   GFRNONAA >60 10/02/2021 1552   GFRAA 86 01/24/2020 0947     No results found.     Assessment & Plan:   1. Lymphedema Recommend:  No surgery or intervention at this point in time.    I have reviewed my discussion with the patient regarding lymphedema and why it  causes symptoms.  Patient will continue wearing  graduated compression on a daily basis. The patient should put the compression on first thing in the morning and removing them in  the evening. The patient should not sleep in the compression.   In addition, behavioral modification throughout the day will be continued.  This will include frequent elevation (such as in a recliner), use of over the counter pain medications as needed and exercise such as walking.  The systemic causes for chronic edema such as liver, kidney and cardiac etiologies do not appear to have significant changed over the past year.    Despite conservative treatments, including at least 4 weeks, including graduated compression therapy class 1 and behavioral modification including exercise and elevation the patient  has not obtained adequate control of the lymphedema.  The patient still has stage 3 lymphedema and therefore, I believe that a lymph pump should be added to improve the control of the patient's lymphedema.  Additionally, a lymph pump is warranted because it will reduce the risk of cellulitis and ulceration in the future.  Patient should follow-up in six months    2. Hyperlipidemia, unspecified hyperlipidemia type Continue statin as ordered and reviewed, no changes at this time    Current Outpatient Medications on File Prior to Visit  Medication Sig Dispense Refill   apixaban (ELIQUIS) 5 MG TABS tablet Take 1 tablet (5 mg total) by mouth 2 (two) times daily. 60 tablet 4   chlorpheniramine-HYDROcodone (TUSSIONEX) 10-8 MG/5ML Take 5 mLs by mouth every 12 (twelve) hours as needed for cough. 70 mL 0   Cholecalciferol (D3) 50 MCG (2000 UT) TABS Take 2,000 tablets by mouth daily.     citalopram (CELEXA) 20 MG tablet Take 1 tablet (20 mg total) by mouth daily. 90 tablet 4   Ferrous Sulfate (IRON) 325 (65 Fe) MG TABS Take 325 mg by mouth daily.     gabapentin (NEURONTIN) 300 MG capsule Take 2 capsules (600 mg total) by mouth at bedtime. 180 capsule 4   magnesium oxide  (MAG-OX) 400 MG tablet Take 400 mg by mouth daily.     methocarbamol (ROBAXIN) 500 MG tablet Take 250-500 mg by mouth at bedtime as needed.     metoprolol tartrate (LOPRESSOR) 50 MG tablet Take 1 tablet (50 mg total) by mouth 2 (two) times daily. 60 tablet 3   predniSONE (DELTASONE) 50 MG tablet Take 1 tablet (50 mg total) by mouth daily with breakfast. 5 tablet 0   rOPINIRole (REQUIP) 1 MG tablet Take 1 tablet (1 mg total) by mouth 4 (four) times daily. 360 tablet 4   rosuvastatin (CRESTOR) 10 MG tablet Take 1 tablet (10 mg total) by mouth daily. 90 tablet 3   traZODone (DESYREL) 50 MG tablet TAKE 1/2 TO 1 (ONE-HALF TO ONE) TABLET BY MOUTH AT BEDTIME AS NEEDED FOR SLEEP 90 tablet 4   zinc gluconate 50 MG tablet Take 50 mg by mouth daily.     No current facility-administered medications on file prior to visit.    There are no Patient Instructions on file for this visit. No follow-ups on file.   Kris Hartmann, NP

## 2022-02-09 ENCOUNTER — Ambulatory Visit: Payer: Medicare HMO | Admitting: Adult Health

## 2022-02-12 ENCOUNTER — Ambulatory Visit: Payer: Medicare HMO | Attending: Pulmonary Disease

## 2022-02-12 DIAGNOSIS — G2581 Restless legs syndrome: Secondary | ICD-10-CM | POA: Insufficient documentation

## 2022-02-12 DIAGNOSIS — R0683 Snoring: Secondary | ICD-10-CM | POA: Diagnosis not present

## 2022-02-12 DIAGNOSIS — G4730 Sleep apnea, unspecified: Secondary | ICD-10-CM | POA: Diagnosis not present

## 2022-02-17 NOTE — Telephone Encounter (Signed)
Ablation work up completed.

## 2022-02-19 ENCOUNTER — Other Ambulatory Visit: Payer: Self-pay | Admitting: Nurse Practitioner

## 2022-02-19 ENCOUNTER — Other Ambulatory Visit: Payer: Medicare HMO

## 2022-02-19 DIAGNOSIS — D72820 Lymphocytosis (symptomatic): Secondary | ICD-10-CM

## 2022-02-20 ENCOUNTER — Other Ambulatory Visit: Payer: Self-pay | Admitting: Nurse Practitioner

## 2022-02-20 DIAGNOSIS — D729 Disorder of white blood cells, unspecified: Secondary | ICD-10-CM

## 2022-02-20 LAB — CBC WITH DIFFERENTIAL/PLATELET
Basophils Absolute: 0.1 10*3/uL (ref 0.0–0.2)
Basos: 0 %
EOS (ABSOLUTE): 0.2 10*3/uL (ref 0.0–0.4)
Eos: 1 %
Hematocrit: 41.9 % (ref 34.0–46.6)
Hemoglobin: 14 g/dL (ref 11.1–15.9)
Immature Grans (Abs): 0 10*3/uL (ref 0.0–0.1)
Immature Granulocytes: 0 %
Lymphocytes Absolute: 10.9 10*3/uL — ABNORMAL HIGH (ref 0.7–3.1)
Lymphs: 66 %
MCH: 30.8 pg (ref 26.6–33.0)
MCHC: 33.4 g/dL (ref 31.5–35.7)
MCV: 92 fL (ref 79–97)
Monocytes Absolute: 0.9 10*3/uL (ref 0.1–0.9)
Monocytes: 5 %
Neutrophils Absolute: 4.7 10*3/uL (ref 1.4–7.0)
Neutrophils: 28 %
Platelets: 199 10*3/uL (ref 150–450)
RBC: 4.55 x10E6/uL (ref 3.77–5.28)
RDW: 13.1 % (ref 11.7–15.4)
WBC: 16.8 10*3/uL — ABNORMAL HIGH (ref 3.4–10.8)

## 2022-02-20 NOTE — Progress Notes (Signed)
Contacted via MyChart -- 4 week lab only visit please:)   Good afternoon Erika Anderson, your labs have returned and your white blood cell count remains a little elevated as does lymphocyte count.  However, these are trending down.  Lets recheck again one more time in 4 weeks to see if these continue to trend down before we send you to hematology.  I will have staff all to schedule lab only visit.  Any questions? Keep being wonderful!!  Thank you for allowing me to participate in your care.  I appreciate you. Kindest regards, Trayvond Viets

## 2022-02-22 NOTE — Progress Notes (Signed)
Called and scheduled  labs for 4 weeks patient would like someone from the clinical team to call and explain provider lab notes. Please advise

## 2022-02-22 NOTE — Telephone Encounter (Signed)
Requested Prescriptions  Pending Prescriptions Disp Refills  . ELIQUIS 5 MG TABS tablet [Pharmacy Med Name: Eliquis 5 MG Oral Tablet] 60 tablet 0    Sig: Take 1 tablet by mouth twice daily     Hematology:  Anticoagulants - apixaban Passed - 02/19/2022  7:36 PM      Passed - PLT in normal range and within 360 days    Platelets  Date Value Ref Range Status  02/19/2022 199 150 - 450 x10E3/uL Final         Passed - HGB in normal range and within 360 days    Hemoglobin  Date Value Ref Range Status  02/19/2022 14.0 11.1 - 15.9 g/dL Final         Passed - HCT in normal range and within 360 days    Hematocrit  Date Value Ref Range Status  02/19/2022 41.9 34.0 - 46.6 % Final         Passed - Cr in normal range and within 360 days    Creatinine, Ser  Date Value Ref Range Status  01/27/2022 0.91 0.57 - 1.00 mg/dL Final         Passed - AST in normal range and within 360 days    AST  Date Value Ref Range Status  01/27/2022 22 0 - 40 IU/L Final         Passed - ALT in normal range and within 360 days    ALT  Date Value Ref Range Status  01/27/2022 23 0 - 32 IU/L Final         Passed - Valid encounter within last 12 months    Recent Outpatient Visits          2 weeks ago Upper respiratory tract infection, unspecified type   [place], Megan P, DO   3 weeks ago Paroxysmal atrial fibrillation ([place])   [place], Jolene T, NP   2 months ago Paroxysmal atrial fibrillation ([place])   [place], [place] T, NP   4 months ago Atrial flutter, unspecified type ([place])   [place], Jolene T, NP   5 months ago Irregular heartbeat   [place], Barbaraann Faster, NP      Future Appointments            In 4 days [place], Fonnie Mu, NP [facility]   In 1 month Cannady, Barbaraann Faster, NP [employer], [place]   In 2 months [place], Aaron Edelman, MD [facility]. [place]   In 4 months Vickie Epley, MD [place]. [place]   In 5 months Cannady, Barbaraann Faster, NP [facility], [place]

## 2022-02-26 ENCOUNTER — Ambulatory Visit: Payer: Medicare HMO

## 2022-02-26 ENCOUNTER — Encounter: Payer: Self-pay | Admitting: Adult Health

## 2022-02-26 ENCOUNTER — Telehealth (INDEPENDENT_AMBULATORY_CARE_PROVIDER_SITE_OTHER): Payer: Medicare HMO | Admitting: Adult Health

## 2022-02-26 DIAGNOSIS — R0683 Snoring: Secondary | ICD-10-CM

## 2022-02-26 NOTE — Patient Instructions (Signed)
Call back if you change her mind on oral appliance referral to orthodontics. Avoid sleeping on your back May sleep at an incline Healthy sleep regimen Healthy weight loss  Do not drive is sleepy  Follow-up with our office as needed

## 2022-02-26 NOTE — Progress Notes (Signed)
Reviewed and agree with assessment/plan.   Chesley Mires, MD [facility] Pulmonary/Critical Care 02/26/2022, 2:38 PM Pager:  [phone]

## 2022-02-26 NOTE — Progress Notes (Signed)
Virtual Visit via Video Note  I connected with Erika Anderson on 02/26/22 at 10:30 AM EDT by a video enabled telemedicine application and verified that I am speaking with the correct person using two identifiers.  Location: Patient: Home  Provider: Office    I discussed the limitations of evaluation and management by telemedicine and the availability of in person appointments. The patient expressed understanding and agreed to proceed.  History of Present Illness: 71 year old female seen for sleep consult November 27, 2021 for snoring, daytime sleepiness and restless sleep Medical history significant for atrial fibs on Eliquis  Today's video visit is a 22-monthfollow-up.  Patient was seen last visit for sleep consult for snoring, daytime sleepiness and restless sleep.  She was set up for a in lab sleep study.  This was completed on February 12, 2022.  This showed no significant sleep apnea with a AHI at 2/hour.  SPO2 low at 90%.  No REM sleep was achieved during this study.  Patient had limited sleep time of only 215 minutes.  We reviewed her sleep study in detail.  Patient says she could not sleep hardly at all that entire night.  There was notable heavy snoring.  Felt that many of her awakenings were due to snoring .  Patient says that since her last visit she has stopped taking trazodone.  She says that she does feel quite a bit better since stopping this.  She has significant persistent sleepiness while taking this.  She says she still feels that she wakes up intermittently however she does not feel as tired during the daytime.  We discussed oral appliance for snoring.  We discussed healthy weight loss and a healthy sleep regimen.  Past Medical History:  Diagnosis Date   [place]    Hyperlipidemia    Obesity (BMI 30-39.9)    Restless legs syndrome (RLS) 12/28/2014   RLS (restless legs syndrome)     Current Outpatient Medications on File Prior to Visit  Medication Sig  Dispense Refill   chlorpheniramine-HYDROcodone (TUSSIONEX) 10-8 MG/5ML Take 5 mLs by mouth every 12 (twelve) hours as needed for cough. 70 mL 0   Cholecalciferol (D3) 50 MCG (2000 UT) TABS Take 2,000 tablets by mouth daily.     citalopram (CELEXA) 20 MG tablet Take 1 tablet (20 mg total) by mouth daily. 90 tablet 4   ELIQUIS 5 MG TABS tablet Take 1 tablet by mouth twice daily 60 tablet 0   Ferrous Sulfate (IRON) 325 (65 Fe) MG TABS Take 325 mg by mouth daily.     gabapentin (NEURONTIN) 300 MG capsule Take 2 capsules (600 mg total) by mouth at bedtime. 180 capsule 4   magnesium oxide (MAG-OX) 400 MG tablet Take 400 mg by mouth daily.     methocarbamol (ROBAXIN) 500 MG tablet Take 250-500 mg by mouth at bedtime as needed.     metoprolol tartrate (LOPRESSOR) 50 MG tablet Take 1 tablet (50 mg total) by mouth 2 (two) times daily. 60 tablet 3   rOPINIRole (REQUIP) 1 MG tablet Take 1 tablet (1 mg total) by mouth 4 (four) times daily. 360 tablet 4   rosuvastatin (CRESTOR) 10 MG tablet Take 1 tablet (10 mg total) by mouth daily. 90 tablet 3   traZODone (DESYREL) 50 MG tablet TAKE 1/2 TO 1 (ONE-HALF TO ONE) TABLET BY MOUTH AT BEDTIME AS NEEDED FOR SLEEP 90 tablet 4   zinc gluconate 50 MG tablet Take 50 mg by mouth daily.  No current facility-administered medications on file prior to visit.      Observations/Objective: Appears well in no acute distress  Assessment and Plan: Snoring and daytime sleepiness.  Patient did have an in lab sleep study.  This was negative for sleep apnea.  There was poor sleep quality and no REM sleep.   We discussed her sleep study results in detail.  Discussed doing a home sleep study.  Possibly using a sleep aid.  Patient says she is feeling better since stopping trazodone.  For now we will hold off on repeat sleep study at home.  Healthy sleep regimen discussed with her.  Positional sleep.  Weight loss.  She also can be referred to orthodontics for oral appliance.  We  did talk about over-the-counter snore guard if this was more affordable.  Plan  Patient Instructions  Call back if you change her mind on oral appliance referral to orthodontics. Avoid sleeping on your back May sleep at an incline Healthy sleep regimen Healthy weight loss  Do not drive is sleepy  Follow-up with our office as needed    Follow Up Instructions:    I discussed the assessment and treatment plan with the patient. The patient was provided an opportunity to ask questions and all were answered. The patient agreed with the plan and demonstrated an understanding of the instructions.   The patient was advised to call back or seek an in-person evaluation if the symptoms worsen or if the condition fails to improve as anticipated.  I provided 20  minutes of non-face-to-face time during this encounter.   Rexene Edison, NP

## 2022-03-01 ENCOUNTER — Other Ambulatory Visit
Admission: RE | Admit: 2022-03-01 | Discharge: 2022-03-01 | Disposition: A | Discharge status: Home / Self Care | Payer: Medicare HMO | Attending: Cardiology | Admitting: Cardiology

## 2022-03-01 DIAGNOSIS — Z01818 Encounter for other preprocedural examination: Secondary | ICD-10-CM | POA: Insufficient documentation

## 2022-03-01 DIAGNOSIS — I4819 Other persistent atrial fibrillation: Secondary | ICD-10-CM | POA: Diagnosis not present

## 2022-03-01 LAB — CBC WITH DIFFERENTIAL/PLATELET
Abs Immature Granulocytes: 0.02 10*3/uL (ref 0.00–0.07)
Basophils Absolute: 0.1 10*3/uL (ref 0.0–0.1)
Basophils Relative: 1 %
Eosinophils Absolute: 0.2 10*3/uL (ref 0.0–0.5)
Eosinophils Relative: 2 %
HCT: 39.3 % (ref 36.0–46.0)
Hemoglobin: 13.3 g/dL (ref 12.0–15.0)
Immature Granulocytes: 0 %
Lymphocytes Relative: 65 %
Lymphs Abs: 9.9 10*3/uL — ABNORMAL HIGH (ref 0.7–4.0)
MCH: 30.2 pg (ref 26.0–34.0)
MCHC: 33.8 g/dL (ref 30.0–36.0)
MCV: 89.3 fL (ref 80.0–100.0)
Monocytes Absolute: 0.9 10*3/uL (ref 0.1–1.0)
Monocytes Relative: 6 %
Neutro Abs: 3.9 10*3/uL (ref 1.7–7.7)
Neutrophils Relative %: 26 %
Platelets: 184 10*3/uL (ref 150–400)
RBC: 4.4 MIL/uL (ref 3.87–5.11)
RDW: 12.7 % (ref 11.5–15.5)
Smear Review: NORMAL
WBC Morphology: ABNORMAL
WBC: 15 10*3/uL — ABNORMAL HIGH (ref 4.0–10.5)
nRBC: 0 % (ref 0.0–0.2)

## 2022-03-01 LAB — BASIC METABOLIC PANEL
Anion gap: 6 (ref 5–15)
BUN: 24 mg/dL — ABNORMAL HIGH (ref 8–23)
CO2: 27 mmol/L (ref 22–32)
Calcium: 9.3 mg/dL (ref 8.9–10.3)
Chloride: 108 mmol/L (ref 98–111)
Creatinine, Ser: 0.76 mg/dL (ref 0.44–1.00)
GFR, Estimated: >60 mL/min (ref >60)
Glucose, Bld: 97 mg/dL (ref 70–99)
Potassium: 4.1 mmol/L (ref 3.5–5.1)
Sodium: 141 mmol/L (ref 135–145)

## 2022-03-02 ENCOUNTER — Other Ambulatory Visit: Payer: Self-pay | Admitting: Nurse Practitioner

## 2022-03-02 ENCOUNTER — Ambulatory Visit (INDEPENDENT_AMBULATORY_CARE_PROVIDER_SITE_OTHER): Payer: Medicare HMO | Admitting: *Deleted

## 2022-03-02 DIAGNOSIS — Z0000 Encounter for general adult medical examination without abnormal findings: Secondary | ICD-10-CM | POA: Diagnosis not present

## 2022-03-02 DIAGNOSIS — D72820 Lymphocytosis (symptomatic): Secondary | ICD-10-CM | POA: Insufficient documentation

## 2022-03-02 DIAGNOSIS — C9110 Chronic lymphocytic leukemia of B-cell type not having achieved remission: Secondary | ICD-10-CM | POA: Insufficient documentation

## 2022-03-02 LAB — PATHOLOGIST SMEAR REVIEW

## 2022-03-02 NOTE — Progress Notes (Signed)
Subjective:   Erika Anderson is a 71 y.o. female who presents for Medicare Annual (Subsequent) preventive examination.  I connected with  [name] on 03/02/22 by a telephone enabled telemedicine application and verified that I am speaking with the correct person using two identifiers.   I discussed the limitations of evaluation and management by telemedicine. The patient expressed understanding and agreed to proceed.  Patient location: home  Provider location:  Tele-Health-home    Review of Systems     Cardiac Risk Factors include: advanced age (>17mn, >>61women);family history of premature cardiovascular disease;obesity (BMI >30kg/m2)     Objective:    Today's Vitals   There is no height or weight on file to calculate BMI.     03/02/2022    8:36 AM 08/24/2021    9:47 AM 04/24/2021    6:41 PM 02/27/2021    6:01 PM 02/22/2020    1:01 PM 01/17/2019    8:58 AM 05/03/2018   10:09 AM  Advanced Directives  Does Patient Have a Medical Advance Directive? No No No No No No No  Would patient like information on creating a medical advance directive? No - Patient declined No - Patient declined  No - Patient declined   No - Patient declined    Current Medications (verified) Outpatient Encounter Medications as of 03/02/2022  Medication Sig   chlorpheniramine-HYDROcodone (TUSSIONEX) 10-8 MG/5ML Take 5 mLs by mouth every 12 (twelve) hours as needed for cough.   Cholecalciferol (D3) 50 MCG (2000 UT) TABS Take 2,000 tablets by mouth daily.   citalopram (CELEXA) 20 MG tablet Take 1 tablet (20 mg total) by mouth daily.   ELIQUIS 5 MG TABS tablet Take 1 tablet by mouth twice daily   Ferrous Sulfate (IRON) 325 (65 Fe) MG TABS Take 325 mg by mouth daily.   gabapentin (NEURONTIN) 300 MG capsule Take 2 capsules (600 mg total) by mouth at bedtime.   magnesium oxide (MAG-OX) 400 MG tablet Take 400 mg by mouth daily.   methocarbamol (ROBAXIN) 500 MG tablet Take 250-500 mg by mouth at  bedtime as needed.   metoprolol tartrate (LOPRESSOR) 50 MG tablet Take 1 tablet (50 mg total) by mouth 2 (two) times daily.   rOPINIRole (REQUIP) 1 MG tablet Take 1 tablet (1 mg total) by mouth 4 (four) times daily.   rosuvastatin (CRESTOR) 10 MG tablet Take 1 tablet (10 mg total) by mouth daily.   traZODone (DESYREL) 50 MG tablet TAKE 1/2 TO 1 (ONE-HALF TO ONE) TABLET BY MOUTH AT BEDTIME AS NEEDED FOR SLEEP   zinc gluconate 50 MG tablet Take 50 mg by mouth daily.   No facility-administered encounter medications on file as of 03/02/2022.    Allergies (verified) Patient has no known allergies.   History: Past Medical History:  Diagnosis Date   Atrial fibrillation (HCC)    Hyperlipidemia    Obesity (BMI 30-39.9)    Restless legs syndrome (RLS) 12/28/2014   RLS (restless legs syndrome)    Past Surgical History:  Procedure Laterality Date   COLONOSCOPY WITH PROPOFOL N/A 08/24/2021   Procedure: COLONOSCOPY WITH PROPOFOL;  Surgeon: AJonathon Bellows MD;  [facility]  Service: Gastroenterology;  Laterality: N/A;   HEMORROIDECTOMY     TOTAL ABDOMINAL HYSTERECTOMY     ovaries remain   Family History  Problem Relation Age of Onset   Cancer Mother        ovarian   Dementia Father    Arthritis Sister  Heart disease Brother        MI   Stroke Paternal Grandmother    Cancer Paternal Grandfather        lung   Breast cancer Neg Hx    Social History   Socioeconomic History   Marital status: Married    Spouse name: Not on file   Number of children: Not on file   Years of education: 12   Highest education level: High school graduate  Occupational History   Occupation: retired  Tobacco Use   Smoking status: Never   Smokeless tobacco: Never  Vaping Use   Vaping Use: Never used  Substance and Sexual Activity   Alcohol use: No    Alcohol/week: 0.0 standard drinks of alcohol   Drug use: No   Sexual activity: Not Currently  Other Topics Concern   Not on file  Social  History Narrative   Helps take care of grandchildren    Social Determinants of Health   Financial Resource Strain: Low Risk  (03/02/2022)   Overall Financial Resource Strain (CARDIA)    Difficulty of Paying Living Expenses: Not hard at all  Food Insecurity: No Food Insecurity (03/02/2022)   Hunger Vital Sign    Worried About Running Out of Food in the Last Year: Never true    [place] in the Last Year: Never true  Transportation Needs: No Transportation Needs (02/27/2021)   PRAPARE - Hydrologist (Medical): No    Lack of Transportation (Non-Medical): No  Physical Activity: Inactive (03/02/2022)   Exercise Vital Sign    Days of Exercise per Week: 0 days    Minutes of Exercise per Session: 0 min  Stress: No Stress Concern Present (03/02/2022)   [place]    Feeling of Stress : Not at all  Social Connections: [place] (03/02/2022)   Social Connection and Isolation Panel [NHANES]    Frequency of Communication with Friends and Family: More than three times a week    Frequency of Social Gatherings with Friends and Family: Twice a week    Attends Religious Services: More than 4 times per year    Active Member of [employer] or Organizations: Yes    Attends Music therapist: More than 4 times per year    Marital Status: Married    Tobacco Counseling Counseling given: Not Answered   Clinical Intake:  Pre-visit preparation completed: Yes  Pain : No/denies pain     Diabetes: No  How often do you need to have someone help you when you read instructions, pamphlets, or other written materials from your doctor or pharmacy?: 1 - Never  Diabetic?  no  Interpreter Needed?: No  Information entered by :: Leroy Kennedy LPN   Activities of Daily Living    03/02/2022    8:42 AM 07/30/2021    9:12 AM  In your present state of health, do you have any difficulty performing  the following activities:  Hearing? 0 0  Vision? 0 0  Difficulty concentrating or making decisions? 0 0  Walking or climbing stairs? 1 0  Dressing or bathing? 0 0  Doing errands, shopping? 0 0  Preparing Food and eating ? N   Using the Toilet? N   In the past six months, have you accidently leaked urine? N   Do you have problems with loss of bowel control? N   Managing your Medications? N   Managing  your Finances? N   Housekeeping or managing your Housekeeping? N     Patient Care Team: Venita Lick, NP as PCP - General (Nurse Practitioner) Vickie Epley, MD as PCP - Electrophysiology (Cardiology) Rico Junker, RN as Registered Nurse Theodore Demark, RN (Inactive) as Registered Nurse  Indicate any recent Medical Services you may have received from other than [facility] providers in the past year (date may be approximate).     Assessment:   This is a routine wellness examination for Vicke.  Hearing/Vision screen Hearing Screening - Comments:: No trouble hearing Vision Screening - Comments:: [facility] Up to date  Dietary issues and exercise activities discussed: Current Exercise Habits: The patient does not participate in regular exercise at present   Goals Addressed             This Visit's Progress    DIET - INCREASE WATER INTAKE   On track    Recommend drinking at least 6-8 glasses of water a day      Patient Stated   On track    02/22/2020, stay active     Weight (lb) < 200 lb (90.7 kg)         Depression Screen    03/02/2022    8:41 AM 01/27/2022    8:42 AM 12/07/2021    9:18 AM 07/30/2021    9:10 AM 02/27/2021    6:00 PM 07/28/2020    9:03 AM 02/22/2020    1:02 PM  PHQ 2/9 Scores  PHQ - 2 Score 0 0 0 0 0 0 0  PHQ- 9 Score '4 7 5 3  '$ 0     Fall Risk    03/02/2022    8:36 AM 01/27/2022    8:41 AM 12/07/2021    9:18 AM 07/30/2021    9:12 AM 02/27/2021    6:02 PM  Fall Risk   Falls in the past year? '1 1 1 1 '$ 0  Number falls in past yr: 0 1 0 0 0   Injury with Fall? 0 0 1 1 0  Risk for fall due to :  History of fall(s) History of fall(s) History of fall(s) No Fall Risks  Follow up Falls evaluation completed;Education provided;Falls prevention discussed Falls evaluation completed Falls evaluation completed Education provided;Falls prevention discussed Falls evaluation completed    FALL RISK PREVENTION PERTAINING TO THE HOME:  Any stairs in or around the home? No  If so, are there any without handrails? No  Home free of loose throw rugs in walkways, pet beds, electrical cords, etc? Yes  Adequate lighting in your home to reduce risk of falls? Yes   ASSISTIVE DEVICES UTILIZED TO PREVENT FALLS:  Life alert? No  Use of a cane, walker or w/c? No  Grab bars in the bathroom? Yes  Shower chair or bench in shower? No  Elevated toilet seat or a handicapped toilet? No   TIMED UP AND GO:  Was the test performed? No .    Cognitive Function:        03/02/2022    8:38 AM 02/27/2021    6:03 PM 02/22/2020    1:03 PM 01/12/2018    8:57 AM  6CIT Screen  What Year? 0 points 0 points 0 points 0 points  What month? 0 points 0 points 0 points 0 points  What time? 0 points 0 points 0 points 0 points  Count back from 20 0 points 0 points 0 points 0 points  Months in  reverse 0 points 0 points 0 points 0 points  Repeat phrase 0 points 0 points 2 points 2 points  Total Score 0 points 0 points 2 points 2 points    Immunizations Immunization History  Administered Date(s) Administered   Fluad Quad(high Dose 65+) 04/09/2019, 07/28/2020   Influenza, High Dose Seasonal PF 06/02/2021   Influenza,inj,Quad PF,6+ Mos 03/09/2016   Moderna Sars-Covid-2 Vaccination 07/04/2019, 08/01/2019   Pneumococcal Conjugate-13 01/11/2017   Pneumococcal Polysaccharide-23 01/12/2018   Tdap 12/30/2015   Zoster Recombinat (Shingrix) 02/07/2019, 05/15/2019    TDAP status: Up to date  Flu Vaccine status: Up to date  Pneumococcal vaccine status: Up to  date  Covid-19 vaccine status: Information provided on how to obtain vaccines.   Qualifies for Shingles Vaccine? No   Zostavax completed No   Shingrix Completed?: Yes  Screening Tests Health Maintenance  Topic Date Due   COVID-19 Vaccine (3 - Moderna risk series) 08/29/2019   INFLUENZA VACCINE  01/05/2022   MAMMOGRAM  01/29/2023   TETANUS/TDAP  12/29/2025   COLONOSCOPY (Pts 45-30yr Insurance coverage will need to be confirmed)  08/25/2031   DEXA SCAN  01/29/2032   Pneumonia Vaccine 70 Years old  Completed   Hepatitis C Screening  Completed   Zoster Vaccines- Shingrix  Completed   HPV VACCINES  Aged Out    Health Maintenance  Health Maintenance Due  Topic Date Due   COVID-19 Vaccine (3 - Moderna risk series) 08/29/2019   INFLUENZA VACCINE  01/05/2022    Colonoscopy completed 2023  Mammogram status: Completed 2023. Repeat every year  Bone Density status: Completed 2023. Results reflect: Bone density results: NORMAL. Repeat every 10 years.  Lung Cancer Screening: (Low Dose CT Chest recommended if Age 70-80years, 30 pack-year currently smoking OR have quit w/in 15years.) does not qualify.   Lung Cancer Screening Referral:   Additional Screening:  Hepatitis C Screening: does not qualify; Completed 2018  Vision Screening: Recommended annual ophthalmology exams for early detection of glaucoma and other disorders of the eye. Is the patient up to date with their annual eye exam?  Yes  Who is the provider or what is the name of the office in which the patient attends annual eye exams? [facility] If pt is not established with a provider, would they like to be referred to a provider to establish care? No .   Dental Screening: Recommended annual dental exams for proper oral hygiene  Community Resource Referral / Chronic Care Management: CRR required this visit?  No   CCM required this visit?  No      Plan:     I have personally reviewed and noted the following in  the patient's chart:   Medical and social history Use of alcohol, tobacco or illicit drugs  Current medications and supplements including opioid prescriptions. Patient is not currently taking opioid prescriptions. Functional ability and status Nutritional status Physical activity Advanced directives List of other physicians Hospitalizations, surgeries, and ER visits in previous 12 months Vitals Screenings to include cognitive, depression, and falls Referrals and appointments  In addition, I have reviewed and discussed with patient certain preventive protocols, quality metrics, and best practice recommendations. A written personalized care plan for preventive services as well as general preventive health recommendations were provided to patient.     JLeroy Kennedy LPN   [id number]  Nurse Notes:

## 2022-03-02 NOTE — Patient Instructions (Signed)
Erika Anderson , Thank you for taking time to come for your Medicare Wellness Visit. I appreciate your ongoing commitment to your health goals. Please review the following plan we discussed and let me know if I can assist you in the future.   Screening recommendations/referrals: Colonoscopy: up to date Mammogram: up to date Bone Density: up to date Recommended yearly ophthalmology/optometry visit for glaucoma screening and checkup Recommended yearly dental visit for hygiene and checkup  Vaccinations: Influenza vaccine: up to date Pneumococcal vaccine: up to date Tdap vaccine: up to date Shingles vaccine: up to date    Advanced directives: Education provided  Conditions/risks identified:   Next appointment: 03-17-2022 @ 8:20  [facility]   04-14-2022 @ 9:20  [place] 70 Years and Older, Female Preventive care refers to lifestyle choices and visits with your health care provider that can promote health and wellness. What does preventive care include? A yearly physical exam. This is also called an annual well check. Dental exams once or twice a year. Routine eye exams. Ask your health care provider how often you should have your eyes checked. Personal lifestyle choices, including: Daily care of your teeth and gums. Regular physical activity. Eating a healthy diet. Avoiding tobacco and drug use. Limiting alcohol use. Practicing safe sex. Taking low-dose aspirin every day. Taking vitamin and mineral supplements as recommended by your health care provider. What happens during an annual well check? The services and screenings done by your health care provider during your annual well check will depend on your age, overall health, lifestyle risk factors, and family history of disease. Counseling  Your health care provider may ask you questions about your: Alcohol use. Tobacco use. Drug use. Emotional well-being. Home and relationship well-being. Sexual activity. Eating  habits. History of falls. Memory and ability to understand (cognition). Work and work Statistician. Reproductive health. Screening  You may have the following tests or measurements: Height, weight, and BMI. Blood pressure. Lipid and cholesterol levels. These may be checked every 5 years, or more frequently if you are over 4 years old. Skin check. Lung cancer screening. You may have this screening every year starting at age 23 if you have a 30-pack-year history of smoking and currently smoke or have quit within the past 15 years. Fecal occult blood test (FOBT) of the stool. You may have this test every year starting at age 35. Flexible sigmoidoscopy or colonoscopy. You may have a sigmoidoscopy every 5 years or a colonoscopy every 10 years starting at age 57. Hepatitis C blood test. Hepatitis B blood test. Sexually transmitted disease (STD) testing. Diabetes screening. This is done by checking your blood sugar (glucose) after you have not eaten for a while (fasting). You may have this done every 1-3 years. Bone density scan. This is done to screen for osteoporosis. You may have this done starting at age 55. Mammogram. This may be done every 1-2 years. Talk to your health care provider about how often you should have regular mammograms. Talk with your health care provider about your test results, treatment options, and if necessary, the need for more tests. Vaccines  Your health care provider may recommend certain vaccines, such as: Influenza vaccine. This is recommended every year. Tetanus, diphtheria, and acellular pertussis (Tdap, Td) vaccine. You may need a Td booster every 10 years. Zoster vaccine. You may need this after age 41. Pneumococcal 13-valent conjugate (PCV13) vaccine. One dose is recommended after age 39. Pneumococcal polysaccharide (PPSV23) vaccine. One dose is recommended  after age 91. Talk to your health care provider about which screenings and vaccines you need and how  often you need them. This information is not intended to replace advice given to you by your health care provider. Make sure you discuss any questions you have with your health care provider. Document Released: 06/20/2015 Document Revised: 02/11/2016 Document Reviewed: 03/25/2015 Elsevier Interactive Patient Education  2017 [place] Prevention in the Home Falls can cause injuries. They can happen to people of all ages. There are many things you can do to make your home safe and to help prevent falls. What can I do on the outside of my home? Regularly fix the edges of walkways and driveways and fix any cracks. Remove anything that might make you trip as you walk through a door, such as a raised step or threshold. Trim any bushes or trees on the path to your home. Use bright outdoor lighting. Clear any walking paths of anything that might make someone trip, such as rocks or tools. Regularly check to see if handrails are loose or broken. Make sure that both sides of any steps have handrails. Any raised decks and porches should have guardrails on the edges. Have any leaves, snow, or ice cleared regularly. Use sand or salt on walking paths during winter. Clean up any spills in your garage right away. This includes oil or grease spills. What can I do in the bathroom? Use night lights. Install grab bars by the toilet and in the tub and shower. Do not use towel bars as grab bars. Use non-skid mats or decals in the tub or shower. If you need to sit down in the shower, use a plastic, non-slip stool. Keep the floor dry. Clean up any water that spills on the floor as soon as it happens. Remove soap buildup in the tub or shower regularly. Attach bath mats securely with double-sided non-slip rug tape. Do not have throw rugs and other things on the floor that can make you trip. What can I do in the bedroom? Use night lights. Make sure that you have a light by your bed that is easy to  reach. Do not use any sheets or blankets that are too big for your bed. They should not hang down onto the floor. Have a firm chair that has side arms. You can use this for support while you get dressed. Do not have throw rugs and other things on the floor that can make you trip. What can I do in the kitchen? Clean up any spills right away. Avoid walking on wet floors. Keep items that you use a lot in easy-to-reach places. If you need to reach something above you, use a strong step stool that has a grab bar. Keep electrical cords out of the way. Do not use floor polish or wax that makes floors slippery. If you must use wax, use non-skid floor wax. Do not have throw rugs and other things on the floor that can make you trip. What can I do with my stairs? Do not leave any items on the stairs. Make sure that there are handrails on both sides of the stairs and use them. Fix handrails that are broken or loose. Make sure that handrails are as long as the stairways. Check any carpeting to make sure that it is firmly attached to the stairs. Fix any carpet that is loose or worn. Avoid having throw rugs at the top or bottom of the stairs. If you  do have throw rugs, attach them to the floor with carpet tape. Make sure that you have a light switch at the top of the stairs and the bottom of the stairs. If you do not have them, ask someone to add them for you. What else can I do to help prevent falls? Wear shoes that: Do not have high heels. Have rubber bottoms. Are comfortable and fit you well. Are closed at the toe. Do not wear sandals. If you use a stepladder: Make sure that it is fully opened. Do not climb a closed stepladder. Make sure that both sides of the stepladder are locked into place. Ask someone to hold it for you, if possible. Clearly mark and make sure that you can see: Any grab bars or handrails. First and last steps. Where the edge of each step is. Use tools that help you move  around (mobility aids) if they are needed. These include: Canes. Walkers. Scooters. Crutches. Turn on the lights when you go into a dark area. Replace any light bulbs as soon as they burn out. Set up your furniture so you have a clear path. Avoid moving your furniture around. If any of your floors are uneven, fix them. If there are any pets around you, be aware of where they are. Review your medicines with your doctor. Some medicines can make you feel dizzy. This can increase your chance of falling. Ask your doctor what other things that you can do to help prevent falls. This information is not intended to replace advice given to you by your health care provider. Make sure you discuss any questions you have with your health care provider. Document Released: 03/20/2009 Document Revised: 10/30/2015 Document Reviewed: 06/28/2014 Elsevier Interactive Patient Education  2017 [employer].

## 2022-03-15 ENCOUNTER — Encounter: Payer: Self-pay | Admitting: Oncology

## 2022-03-15 ENCOUNTER — Inpatient Hospital Stay: Payer: Medicare HMO

## 2022-03-15 ENCOUNTER — Inpatient Hospital Stay: Payer: Medicare HMO | Attending: Oncology | Admitting: Oncology

## 2022-03-15 ENCOUNTER — Telehealth: Payer: Self-pay | Admitting: Nurse Practitioner

## 2022-03-15 VITALS — BP 121/71 | HR 63 | Temp 97.8°F | Resp 19 | Wt 248.0 lb

## 2022-03-15 DIAGNOSIS — C9110 Chronic lymphocytic leukemia of B-cell type not having achieved remission: Secondary | ICD-10-CM | POA: Insufficient documentation

## 2022-03-15 DIAGNOSIS — D72820 Lymphocytosis (symptomatic): Secondary | ICD-10-CM | POA: Insufficient documentation

## 2022-03-15 DIAGNOSIS — Z90710 Acquired absence of both cervix and uterus: Secondary | ICD-10-CM | POA: Diagnosis not present

## 2022-03-15 DIAGNOSIS — G2581 Restless legs syndrome: Secondary | ICD-10-CM | POA: Insufficient documentation

## 2022-03-15 DIAGNOSIS — Z8041 Family history of malignant neoplasm of ovary: Secondary | ICD-10-CM | POA: Insufficient documentation

## 2022-03-15 DIAGNOSIS — Z7901 Long term (current) use of anticoagulants: Secondary | ICD-10-CM | POA: Diagnosis not present

## 2022-03-15 DIAGNOSIS — Z801 Family history of malignant neoplasm of trachea, bronchus and lung: Secondary | ICD-10-CM | POA: Insufficient documentation

## 2022-03-15 LAB — CBC WITH DIFFERENTIAL/PLATELET
Abs Immature Granulocytes: 0.03 10*3/uL (ref 0.00–0.07)
Basophils Absolute: 0.1 10*3/uL (ref 0.0–0.1)
Basophils Relative: 1 %
Eosinophils Absolute: 0.2 10*3/uL (ref 0.0–0.5)
Eosinophils Relative: 1 %
HCT: 40.5 % (ref 36.0–46.0)
Hemoglobin: 13.8 g/dL (ref 12.0–15.0)
Immature Granulocytes: 0 %
Lymphocytes Relative: 65 %
Lymphs Abs: 11.9 10*3/uL — ABNORMAL HIGH (ref 0.7–4.0)
MCH: 30.8 pg (ref 26.0–34.0)
MCHC: 34.1 g/dL (ref 30.0–36.0)
MCV: 90.4 fL (ref 80.0–100.0)
Monocytes Absolute: 1 10*3/uL (ref 0.1–1.0)
Monocytes Relative: 5 %
Neutro Abs: 5.1 10*3/uL (ref 1.7–7.7)
Neutrophils Relative %: 28 %
Platelets: 208 10*3/uL (ref 150–400)
RBC: 4.48 MIL/uL (ref 3.87–5.11)
RDW: 13.1 % (ref 11.5–15.5)
WBC: 18.2 10*3/uL — ABNORMAL HIGH (ref 4.0–10.5)
nRBC: 0 % (ref 0.0–0.2)

## 2022-03-15 LAB — IRON AND TIBC
Iron: 113 ug/dL (ref 28–170)
Saturation Ratios: 39 % — ABNORMAL HIGH (ref 10.4–31.8)
TIBC: 290 ug/dL (ref 250–450)
UIBC: 177 ug/dL

## 2022-03-15 LAB — COMPREHENSIVE METABOLIC PANEL
ALT: 30 U/L (ref 0–44)
AST: 31 U/L (ref 15–41)
Albumin: 4 g/dL (ref 3.5–5.0)
Alkaline Phosphatase: 78 U/L (ref 38–126)
Anion gap: 4 — ABNORMAL LOW (ref 5–15)
BUN: 20 mg/dL (ref 8–23)
CO2: 30 mmol/L (ref 22–32)
Calcium: 9.3 mg/dL (ref 8.9–10.3)
Chloride: 104 mmol/L (ref 98–111)
Creatinine, Ser: 0.75 mg/dL (ref 0.44–1.00)
GFR, Estimated: >60 mL/min (ref >60)
Glucose, Bld: 99 mg/dL (ref 70–99)
Potassium: 4.9 mmol/L (ref 3.5–5.1)
Sodium: 138 mmol/L (ref 135–145)
Total Bilirubin: 0.6 mg/dL (ref 0.3–1.2)
Total Protein: 7 g/dL (ref 6.5–8.1)

## 2022-03-15 LAB — FERRITIN: Ferritin: 123 ng/mL (ref 11–307)

## 2022-03-15 LAB — LACTATE DEHYDROGENASE: LDH: 192 U/L (ref 98–192)

## 2022-03-15 NOTE — Telephone Encounter (Signed)
Copied from [place] [id number]. Topic: General - Other >> Mar 15, 2022 11:46 AM Cyndi Bender wrote: Reason for CRM: Pt stated she had labs drawn this morning and wanted to know if she still needed the lab appt for 03/17/22. Cb# [phone]

## 2022-03-15 NOTE — Telephone Encounter (Signed)
Patient notified of cancellation

## 2022-03-15 NOTE — Assessment & Plan Note (Signed)
Will check iron panel

## 2022-03-15 NOTE — Progress Notes (Signed)
Hematology/Oncology Consult Note [phone] [phone]     Patient Care Team: Venita Lick, NP as PCP - General (Nurse Practitioner) Vickie Epley, MD as PCP - Electrophysiology (Cardiology) Rico Junker, RN as Registered Nurse Theodore Demark, RN (Inactive) as Registered Nurse  ASSESSMENT & PLAN  Lymphocytosis Labs reviewed and discussed with patient that Leukocytosis, predominantly lymphocytosis.  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  Check CBC;CMP, LDH, smear review, peripheral flowcytometry.   RLS (restless legs syndrome) Will check iron panel. .   Orders Placed This Encounter  Procedures   US Abdomen Complete    Standing Status:   Future    Standing Expiration Date:   03/16/2023    Order Specific Question:   Reason for exam:    Answer:   Night Sweats, Lymphocytosis    Order Specific Question:   Preferred imaging location?    Answer:   [facility]   Ferritin    Standing Status:   Future    Number of Occurrences:   1    Standing Expiration Date:   09/14/2022   Iron and TIBC    Standing Status:   Future    Number of Occurrences:   1    Standing Expiration Date:   03/16/2023   CBC with Differential/Platelet    Standing Status:   Future    Number of Occurrences:   1    Standing Expiration Date:   03/16/2023   Lactate dehydrogenase    Standing Status:   Future    Number of Occurrences:   1    Standing Expiration Date:   03/16/2023   Comprehensive metabolic panel    Standing Status:   Future    Number of Occurrences:   1    Standing Expiration Date:   03/16/2023   Flow cytometry panel-leukemia/lymphoma work-up    Standing Status:   Future    Number of Occurrences:   1    Standing Expiration Date:   03/16/2023   Labs today, follow up in 3-4 weeks    Earlie Server, MD, PhD [facility] Hematology Oncology 03/15/2022       CHIEF COMPLAINTS/PURPOSE OF CONSULTATION:  Leukocytosis/elevate white count  HISTORY OF PRESENTING  ILLNESS:  Erika Anderson 71 y.o. female is here because of elevated WBC.  She was found to have abnormal CBC on 03/01/22, total white count of 15, predominantly lymphocytosis. This is chronic since Feb 2023.  She denies recent infection. Peripheral blood smear + smudge cells.  Denies weight loss, fever, chills. + she has noticed night sweating daily for the past 6 months.    She had no prior history or diagnosis of cancer.  Her age appropriate screening programs are up-to-date. The patient has no prior diagnosis of autoimmune disease and was not prescribed corticosteroids related products.  + Restless leg syndrome, sleep disturbance for 1 year.  Family history of ovarian cancer -mother.   MEDICAL HISTORY:  Past Medical History:  Diagnosis Date   Atrial fibrillation (HCC)    Hyperlipidemia    Obesity (BMI 30-39.9)    Restless legs syndrome (RLS) 12/28/2014   RLS (restless legs syndrome)     SURGICAL HISTORY: Past Surgical History:  Procedure Laterality Date   COLONOSCOPY WITH PROPOFOL N/A 08/24/2021   Procedure: COLONOSCOPY WITH PROPOFOL;  Surgeon: Jonathon Bellows, MD;  Location: [facility] ENDOSCOPY;  Service: Gastroenterology;  Laterality: N/A;   HEMORROIDECTOMY     TOTAL ABDOMINAL HYSTERECTOMY     ovaries remain    SOCIAL  HISTORY: Social History   Socioeconomic History   Marital status: Married    Spouse name: Not on file   Number of children: Not on file   Years of education: 12   Highest education level: High school graduate  Occupational History   Occupation: retired  Tobacco Use   Smoking status: Never   Smokeless tobacco: Never  Vaping Use   Vaping Use: Never used  Substance and Sexual Activity   Alcohol use: No    Alcohol/week: 0.0 standard drinks of alcohol   Drug use: No   Sexual activity: Not Currently  Other Topics Concern   Not on file  Social History Narrative   Helps take care of grandchildren    Social Determinants of Health   Financial Resource  Strain: Low Risk  (03/02/2022)   Overall Financial Resource Strain (CARDIA)    Difficulty of Paying Living Expenses: Not hard at all  Food Insecurity: No Food Insecurity (03/02/2022)   Hunger Vital Sign    Worried About Running Out of Food in the Last Year: Never true    [place] in the Last Year: Never true  Transportation Needs: No Transportation Needs (02/27/2021)   PRAPARE - Hydrologist (Medical): No    Lack of Transportation (Non-Medical): No  Physical Activity: Inactive (03/02/2022)   Exercise Vital Sign    Days of Exercise per Week: 0 days    Minutes of Exercise per Session: 0 min  Stress: No Stress Concern Present (03/02/2022)   [place]    Feeling of Stress : Not at all  Social Connections: [place] (03/02/2022)   Social Connection and Isolation Panel [NHANES]    Frequency of Communication with Friends and Family: More than three times a week    Frequency of Social Gatherings with Friends and Family: Twice a week    Attends Religious Services: More than 4 times per year    Active Member of [employer] or Organizations: Yes    Attends Music therapist: More than 4 times per year    Marital Status: Married  Human resources officer Violence: Not At Risk (03/02/2022)   Humiliation, Afraid, Rape, and Kick questionnaire    Fear of Current or Ex-Partner: No    Emotionally Abused: No    Physically Abused: No    Sexually Abused: No    FAMILY HISTORY: Family History  Problem Relation Age of Onset   Cancer Mother        ovarian   Dementia Father    Arthritis Sister    Heart disease Brother        MI   Stroke Paternal Grandmother    Cancer Paternal Grandfather        lung   Breast cancer Neg Hx     ALLERGIES:  has No Known Allergies.  MEDICATIONS:  Current Outpatient Medications  Medication Sig Dispense Refill   chlorpheniramine-HYDROcodone (TUSSIONEX) 10-8  MG/5ML Take 5 mLs by mouth every 12 (twelve) hours as needed for cough. 70 mL 0   Cholecalciferol (D3) 50 MCG (2000 UT) TABS Take 2,000 tablets by mouth daily.     citalopram (CELEXA) 20 MG tablet Take 1 tablet (20 mg total) by mouth daily. 90 tablet 4   ELIQUIS 5 MG TABS tablet Take 1 tablet by mouth twice daily 60 tablet 0   Ferrous Sulfate (IRON) 325 (65 Fe) MG TABS Take 325 mg by mouth daily.  gabapentin (NEURONTIN) 300 MG capsule Take 2 capsules (600 mg total) by mouth at bedtime. 180 capsule 4   magnesium oxide (MAG-OX) 400 MG tablet Take 400 mg by mouth daily.     methocarbamol (ROBAXIN) 500 MG tablet Take 250-500 mg by mouth at bedtime as needed.     metoprolol tartrate (LOPRESSOR) 50 MG tablet Take 1 tablet (50 mg total) by mouth 2 (two) times daily. 60 tablet 3   rOPINIRole (REQUIP) 1 MG tablet Take 1 tablet (1 mg total) by mouth 4 (four) times daily. 360 tablet 4   rosuvastatin (CRESTOR) 10 MG tablet Take 1 tablet (10 mg total) by mouth daily. 90 tablet 3   traZODone (DESYREL) 50 MG tablet TAKE 1/2 TO 1 (ONE-HALF TO ONE) TABLET BY MOUTH AT BEDTIME AS NEEDED FOR SLEEP 90 tablet 4   zinc gluconate 50 MG tablet Take 50 mg by mouth daily.     No current facility-administered medications for this visit.    Review of Systems  Constitutional:  Negative for appetite change, chills, fatigue and fever.  HENT:   Negative for hearing loss and voice change.   Eyes:  Negative for eye problems.  Respiratory:  Negative for chest tightness and cough.   Cardiovascular:  Negative for chest pain.  Gastrointestinal:  Negative for abdominal distention, abdominal pain and blood in stool.  Endocrine: Negative for hot flashes.       Night sweat  Genitourinary:  Negative for difficulty urinating and frequency.   Musculoskeletal:  Negative for arthralgias.  Skin:  Negative for itching and rash.  Neurological:  Negative for extremity weakness.  Hematological:  Negative for adenopathy.   Psychiatric/Behavioral:  Positive for sleep disturbance. Negative for confusion.      PHYSICAL EXAMINATION: ECOG PERFORMANCE STATUS: 1 - Symptomatic but completely ambulatory  Vitals:   03/15/22 0919  BP: 121/71  Pulse: 63  Resp: 19  Temp: 97.8 F (36.6 C)  SpO2: 96%   Filed Weights   03/15/22 0919  Weight: 248 lb (112.5 kg)    Physical Exam Constitutional:      General: She is not in acute distress.    Appearance: She is obese. She is not diaphoretic.  HENT:     Head: Normocephalic and atraumatic.     Nose: Nose normal.     Mouth/Throat:     Pharynx: No oropharyngeal exudate.  Eyes:     General: No scleral icterus.    Pupils: Pupils are equal, round, and reactive to light.  Cardiovascular:     Rate and Rhythm: Normal rate and regular rhythm.     Heart sounds: No murmur heard. Pulmonary:     Effort: Pulmonary effort is normal. No respiratory distress.     Breath sounds: No rales.  Chest:     Chest wall: No tenderness.  Abdominal:     General: There is no distension.     Palpations: Abdomen is soft.     Tenderness: There is no abdominal tenderness.  Musculoskeletal:        General: Normal range of motion.     Cervical back: Normal range of motion and neck supple.  Skin:    General: Skin is warm and dry.     Findings: No erythema.  Neurological:     Mental Status: She is alert and oriented to person, place, and time.     Cranial Nerves: No cranial nerve deficit.     Motor: No abnormal muscle tone.     Coordination: Coordination  normal.  Psychiatric:        Mood and Affect: Affect normal.     LABORATORY DATA:  I have reviewed the data as listed    Latest Ref Rng & Units 03/15/2022   10:03 AM 03/01/2022   10:47 AM 02/19/2022    9:17 AM  CBC  WBC 4.0 - 10.5 K/uL 18.2  15.0  16.8   Hemoglobin 12.0 - 15.0 g/dL 13.8  13.3  14.0   Hematocrit 36.0 - 46.0 % 40.5  39.3  41.9   Platelets 150 - 400 K/uL 208  184  199       Latest Ref Rng & Units 03/15/2022    10:03 AM 03/01/2022   10:47 AM 01/27/2022    9:05 AM  CMP  Glucose 70 - 99 mg/dL 99  97  82   BUN 8 - 23 mg/dL '20  24  22   '$ Creatinine 0.44 - 1.00 mg/dL 0.75  0.76  0.91   Sodium 135 - 145 mmol/L 138  141  142   Potassium 3.5 - 5.1 mmol/L 4.9  4.1  4.3   Chloride 98 - 111 mmol/L 104  108  102   CO2 22 - 32 mmol/L '30  27  24   '$ Calcium 8.9 - 10.3 mg/dL 9.3  9.3  9.6   Total Protein 6.5 - 8.1 g/dL 7.0   6.5   Total Bilirubin 0.3 - 1.2 mg/dL 0.6   0.4   Alkaline Phos 38 - 126 U/L 78   91   AST 15 - 41 U/L 31   22   ALT 0 - 44 U/L 30   23    Lab Results  Component Value Date   LDH 192 03/15/2022    RADIOGRAPHIC STUDIES: I have personally reviewed the radiological images as listed and agreed with the findings in the report. SLEEP STUDY DOCUMENTS  Result Date: 02/16/2022 Ordered by an unspecified provider.  SLEEP STUDY DOCUMENTS  Result Date: 02/16/2022 Ordered by an unspecified provider.  SLEEP STUDY DOCUMENTS  Result Date: 02/16/2022 Ordered by an unspecified provider.

## 2022-03-15 NOTE — Assessment & Plan Note (Signed)
Labs reviewed and discussed with patient that Leukocytosis, predominantly lymphocytosis.  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  Check CBC;CMP, LDH, smear review, peripheral flowcytometry.

## 2022-03-17 ENCOUNTER — Other Ambulatory Visit: Payer: Medicare HMO

## 2022-03-17 ENCOUNTER — Telehealth (HOSPITAL_COMMUNITY): Payer: Self-pay | Admitting: *Deleted

## 2022-03-17 LAB — COMP PANEL: LEUKEMIA/LYMPHOMA: Immunophenotypic Profile: 38

## 2022-03-17 NOTE — Telephone Encounter (Signed)
Reaching out to patient to offer assistance regarding upcoming cardiac imaging study; pt verbalizes understanding of appt date/time, parking situation and where to check in,  and verified current allergies; name and call back number provided for further questions should they arise  Gordy Clement RN Navigator Cardiac Imaging [facility] [phone] office [phone] cell  Patient to take her morning dose of metoprolol two hours prior to her cardiac CT scan.

## 2022-03-18 ENCOUNTER — Ambulatory Visit
Admission: RE | Admit: 2022-03-18 | Discharge: 2022-03-18 | Disposition: A | Discharge status: Home / Self Care | Payer: Medicare HMO | Source: Ambulatory Visit | Attending: Cardiology | Admitting: Cardiology

## 2022-03-18 DIAGNOSIS — I4819 Other persistent atrial fibrillation: Secondary | ICD-10-CM | POA: Diagnosis not present

## 2022-03-18 DIAGNOSIS — Z01818 Encounter for other preprocedural examination: Secondary | ICD-10-CM | POA: Insufficient documentation

## 2022-03-18 MED ORDER — IOHEXOL 350 MG/ML SOLN
100.0000 mL | Freq: Once | INTRAVENOUS | Status: AC | PRN
  Administered 2022-03-18: 100 mL via INTRAVENOUS

## 2022-03-19 NOTE — Pre-Procedure Instructions (Signed)
Instructed patient on the following items: Arrival time 0530 Nothing to eat or drink after midnight No meds AM of procedure Responsible person to drive you home and stay with you for 24 hrs  Have you missed any doses of anti-coagulant Eliquis- hasn't missed any doses    

## 2022-03-21 NOTE — Anesthesia Preprocedure Evaluation (Signed)
Anesthesia Evaluation  Patient identified by MRN, date of birth, ID band Patient awake    Reviewed: Allergy & Precautions, NPO status , Patient's Chart, lab work & pertinent test results  Airway Mallampati: III  TM Distance: <3 FB Neck ROM: Full    Dental  (+) Teeth Intact, Dental Advisory Given   Pulmonary neg pulmonary ROS,    Pulmonary exam normal breath sounds clear to auscultation       Cardiovascular hypertension, Pt. on home beta blockers (-) angina(-) Past MI + dysrhythmias Atrial Fibrillation  Rhythm:Irregular Rate:Abnormal     Neuro/Psych RLS  negative psych ROS   GI/Hepatic negative GI ROS, Neg liver ROS,   Endo/Other  Morbid obesity  Renal/GU negative Renal ROS     Musculoskeletal  (+) Arthritis ,   Abdominal   Peds  Hematology  (+) Blood dyscrasia (Eliquis), ,   Anesthesia Other Findings   Reproductive/Obstetrics                            Anesthesia Physical Anesthesia Plan  ASA: 3  Anesthesia Plan: General   Post-op Pain Management: Tylenol PO (pre-op)*   Induction: Intravenous  PONV Risk Score and Plan: 3 and Dexamethasone, Ondansetron and Treatment may vary due to age or medical condition  Airway Management Planned: Oral ETT  Additional Equipment:   Intra-op Plan:   Post-operative Plan: Extubation in OR  Informed Consent: I have reviewed the patients History and Physical, chart, labs and discussed the procedure including the risks, benefits and alternatives for the proposed anesthesia with the patient or authorized representative who has indicated his/her understanding and acceptance.     Dental advisory given  Plan Discussed with: CRNA  Anesthesia Plan Comments:        Anesthesia Quick Evaluation

## 2022-03-22 ENCOUNTER — Other Ambulatory Visit: Payer: Self-pay

## 2022-03-22 ENCOUNTER — Ambulatory Visit (HOSPITAL_BASED_OUTPATIENT_CLINIC_OR_DEPARTMENT_OTHER): Payer: Medicare HMO | Admitting: Anesthesiology

## 2022-03-22 ENCOUNTER — Encounter (HOSPITAL_COMMUNITY)
Admission: RE | Disposition: A | Discharge status: Home / Self Care | Payer: Medicare HMO | Source: Home / Self Care | Attending: Cardiology

## 2022-03-22 ENCOUNTER — Ambulatory Visit (HOSPITAL_COMMUNITY): Payer: Medicare HMO | Admitting: Anesthesiology

## 2022-03-22 ENCOUNTER — Ambulatory Visit (HOSPITAL_COMMUNITY)
Admission: RE | Admit: 2022-03-22 | Discharge: 2022-03-22 | Disposition: A | Discharge status: Home / Self Care | Payer: Medicare HMO | Attending: Cardiology | Admitting: Cardiology

## 2022-03-22 DIAGNOSIS — Z6838 Body mass index (BMI) 38.0-38.9, adult: Secondary | ICD-10-CM | POA: Diagnosis not present

## 2022-03-22 DIAGNOSIS — I10 Essential (primary) hypertension: Secondary | ICD-10-CM

## 2022-03-22 DIAGNOSIS — E669 Obesity, unspecified: Secondary | ICD-10-CM | POA: Diagnosis not present

## 2022-03-22 DIAGNOSIS — I480 Paroxysmal atrial fibrillation: Secondary | ICD-10-CM

## 2022-03-22 DIAGNOSIS — I4891 Unspecified atrial fibrillation: Secondary | ICD-10-CM

## 2022-03-22 DIAGNOSIS — I4819 Other persistent atrial fibrillation: Secondary | ICD-10-CM | POA: Diagnosis not present

## 2022-03-22 DIAGNOSIS — Z7901 Long term (current) use of anticoagulants: Secondary | ICD-10-CM | POA: Insufficient documentation

## 2022-03-22 DIAGNOSIS — Z6841 Body Mass Index (BMI) 40.0 and over, adult: Secondary | ICD-10-CM

## 2022-03-22 DIAGNOSIS — I4892 Unspecified atrial flutter: Secondary | ICD-10-CM | POA: Insufficient documentation

## 2022-03-22 DIAGNOSIS — Z79899 Other long term (current) drug therapy: Secondary | ICD-10-CM | POA: Insufficient documentation

## 2022-03-22 HISTORY — PX: ATRIAL FIBRILLATION ABLATION: EP1191

## 2022-03-22 LAB — POCT ACTIVATED CLOTTING TIME: Activated Clotting Time: 359 seconds

## 2022-03-22 SURGERY — ATRIAL FIBRILLATION ABLATION
Anesthesia: General

## 2022-03-22 MED ORDER — HEPARIN (PORCINE) IN NACL 1000-0.9 UT/500ML-% IV SOLN
INTRAVENOUS | Status: DC | PRN
  Administered 2022-03-22 (×4): 500 mL

## 2022-03-22 MED ORDER — ONDANSETRON HCL 4 MG/2ML IJ SOLN: 4.0000 mg | Freq: Four times a day (QID) | INTRAMUSCULAR | Status: DC | PRN

## 2022-03-22 MED ORDER — PANTOPRAZOLE SODIUM 40 MG PO TBEC
40.0000 mg | DELAYED_RELEASE_TABLET | Freq: Every day | ORAL | Status: DC
  Administered 2022-03-22: 40 mg via ORAL
  Filled 2022-03-22: qty 1

## 2022-03-22 MED ORDER — PROPOFOL 10 MG/ML IV BOLUS
INTRAVENOUS | Status: DC | PRN
  Administered 2022-03-22: 150 mg via INTRAVENOUS
  Administered 2022-03-22: 50 mg via INTRAVENOUS

## 2022-03-22 MED ORDER — COLCHICINE 0.6 MG PO TABS
0.6000 mg | ORAL_TABLET | Freq: Two times a day (BID) | ORAL | Status: DC
  Administered 2022-03-22: 0.6 mg via ORAL
  Filled 2022-03-22: qty 1

## 2022-03-22 MED ORDER — SODIUM CHLORIDE 0.9 % IV SOLN: 250.0000 mL | INTRAVENOUS | Status: DC | PRN

## 2022-03-22 MED ORDER — SODIUM CHLORIDE 0.9% FLUSH: 3.0000 mL | Freq: Two times a day (BID) | INTRAVENOUS | Status: DC

## 2022-03-22 MED ORDER — APIXABAN 5 MG PO TABS
5.0000 mg | ORAL_TABLET | Freq: Two times a day (BID) | ORAL | Status: DC
  Administered 2022-03-22: 5 mg via ORAL
  Filled 2022-03-22: qty 1

## 2022-03-22 MED ORDER — SUGAMMADEX SODIUM 200 MG/2ML IV SOLN
INTRAVENOUS | Status: DC | PRN
  Administered 2022-03-22: 200 mg via INTRAVENOUS

## 2022-03-22 MED ORDER — PANTOPRAZOLE SODIUM 40 MG PO TBEC: 40.0000 mg | DELAYED_RELEASE_TABLET | Freq: Every day | ORAL | 0 refills | Status: DC

## 2022-03-22 MED ORDER — MIDAZOLAM HCL 2 MG/2ML IJ SOLN
INTRAMUSCULAR | Status: DC | PRN
  Administered 2022-03-22: 2 mg via INTRAVENOUS

## 2022-03-22 MED ORDER — HEPARIN (PORCINE) IN NACL 1000-0.9 UT/500ML-% IV SOLN
INTRAVENOUS | Status: AC
  Filled 2022-03-22: qty 500

## 2022-03-22 MED ORDER — ACETAMINOPHEN 500 MG PO TABS
1000.0000 mg | ORAL_TABLET | Freq: Once | ORAL | Status: AC
  Administered 2022-03-22: 1000 mg via ORAL

## 2022-03-22 MED ORDER — FENTANYL CITRATE (PF) 100 MCG/2ML IJ SOLN
INTRAMUSCULAR | Status: DC | PRN
  Administered 2022-03-22: 100 ug via INTRAVENOUS

## 2022-03-22 MED ORDER — LIDOCAINE 2% (20 MG/ML) 5 ML SYRINGE
INTRAMUSCULAR | Status: DC | PRN
  Administered 2022-03-22: 60 mg via INTRAVENOUS

## 2022-03-22 MED ORDER — HEPARIN SODIUM (PORCINE) 1000 UNIT/ML IJ SOLN
INTRAMUSCULAR | Status: DC | PRN
  Administered 2022-03-22: 16000 [IU] via INTRAVENOUS

## 2022-03-22 MED ORDER — HEPARIN SODIUM (PORCINE) 1000 UNIT/ML IJ SOLN
INTRAMUSCULAR | Status: DC | PRN
  Administered 2022-03-22: 1000 [IU] via INTRAVENOUS

## 2022-03-22 MED ORDER — ROCURONIUM BROMIDE 10 MG/ML (PF) SYRINGE
PREFILLED_SYRINGE | INTRAVENOUS | Status: DC | PRN
  Administered 2022-03-22: 60 mg via INTRAVENOUS

## 2022-03-22 MED ORDER — PROTAMINE SULFATE 10 MG/ML IV SOLN
INTRAVENOUS | Status: DC | PRN
  Administered 2022-03-22: 35 mg via INTRAVENOUS

## 2022-03-22 MED ORDER — ACETAMINOPHEN 325 MG PO TABS: 650.0000 mg | ORAL_TABLET | ORAL | Status: DC | PRN

## 2022-03-22 MED ORDER — DEXAMETHASONE SODIUM PHOSPHATE 10 MG/ML IJ SOLN
INTRAMUSCULAR | Status: DC | PRN
  Administered 2022-03-22: 10 mg via INTRAVENOUS

## 2022-03-22 MED ORDER — SODIUM CHLORIDE 0.9% FLUSH: 3.0000 mL | INTRAVENOUS | Status: DC | PRN

## 2022-03-22 MED ORDER — COLCHICINE 0.6 MG PO TABS: 0.6000 mg | ORAL_TABLET | Freq: Two times a day (BID) | ORAL | 0 refills | Status: DC

## 2022-03-22 MED ORDER — ALBUTEROL SULFATE HFA 108 (90 BASE) MCG/ACT IN AERS
INHALATION_SPRAY | RESPIRATORY_TRACT | Status: DC | PRN
  Administered 2022-03-22 (×3): 2 via RESPIRATORY_TRACT

## 2022-03-22 MED ORDER — HEPARIN SODIUM (PORCINE) 1000 UNIT/ML IJ SOLN
INTRAMUSCULAR | Status: AC
  Filled 2022-03-22: qty 10

## 2022-03-22 MED ORDER — ONDANSETRON HCL 4 MG/2ML IJ SOLN
INTRAMUSCULAR | Status: DC | PRN
  Administered 2022-03-22: 4 mg via INTRAVENOUS

## 2022-03-22 MED ORDER — ACETAMINOPHEN 500 MG PO TABS
ORAL_TABLET | ORAL | Status: AC
  Filled 2022-03-22: qty 2

## 2022-03-22 MED ORDER — SODIUM CHLORIDE 0.9 % IV SOLN: INTRAVENOUS | Status: DC

## 2022-03-22 MED ORDER — ROPINIROLE HCL 1 MG PO TABS: 1.0000 mg | ORAL_TABLET | Freq: Four times a day (QID) | ORAL | Status: DC

## 2022-03-22 MED ORDER — PHENYLEPHRINE HCL-NACL 20-0.9 MG/250ML-% IV SOLN
INTRAVENOUS | Status: DC | PRN
  Administered 2022-03-22: 25 ug/min via INTRAVENOUS

## 2022-03-22 SURGICAL SUPPLY — 19 items
CATH 8FR REPROCESSED SOUNDSTAR (CATHETERS) ×1 IMPLANT
CATH 8FR SOUNDSTAR REPROCESSED (CATHETERS) IMPLANT
CATH ABLAT QDOT MICRO BI TC DF (CATHETERS) IMPLANT
CATH OCTARAY 2.0 F 3-3-3-3-3 (CATHETERS) IMPLANT
CATH S-M CIRCA TEMP PROBE (CATHETERS) IMPLANT
CATH WEB BI DIR CSDF CRV REPRO (CATHETERS) IMPLANT
CLOSURE PERCLOSE PROSTYLE (VASCULAR PRODUCTS) IMPLANT
COVER SWIFTLINK CONNECTOR (BAG) ×1 IMPLANT
MAT PREVALON FULL STRYKER (MISCELLANEOUS) IMPLANT
PACK EP LATEX FREE (CUSTOM PROCEDURE TRAY) ×1
PACK EP LF (CUSTOM PROCEDURE TRAY) ×1 IMPLANT
PAD DEFIB RADIO PHYSIO CONN (PAD) ×1 IMPLANT
PATCH CARTO3 (PAD) IMPLANT
SHEATH BAYLIS TRANSSEPTAL 98CM (NEEDLE) IMPLANT
SHEATH CARTO VIZIGO SM CVD (SHEATH) IMPLANT
SHEATH PINNACLE 8F 10CM (SHEATH) IMPLANT
SHEATH PINNACLE 9F 10CM (SHEATH) IMPLANT
SHEATH PROBE COVER 6X72 (BAG) IMPLANT
TUBING SMART ABLATE COOLFLOW (TUBING) IMPLANT

## 2022-03-22 NOTE — Transfer of Care (Signed)
Immediate Anesthesia Transfer of Care Note  Patient: Levina Boyack [facility]  Procedure(s) Performed: ATRIAL FIBRILLATION ABLATION  Patient Location: Cath Lab  Anesthesia Type:General  Level of Consciousness: awake, alert , and oriented  Airway & Oxygen Therapy: Patient Spontanous Breathing and Patient connected to nasal cannula oxygen  Post-op Assessment: Report given to RN and Post -op Vital signs reviewed and stable  Post vital signs: Reviewed and stable  Last Vitals:  Vitals Value Taken Time  BP 123/62 03/22/22 0953  Temp    Pulse 67 03/22/22 0956  Resp 14 03/22/22 0956  SpO2 96 % 03/22/22 0956  Vitals shown include unvalidated device data.  Last Pain:  Vitals:   03/22/22 0955  TempSrc:   PainSc: 0-No pain         Complications: No notable events documented.

## 2022-03-22 NOTE — H&P (Signed)
Electrophysiology Office Follow up Visit Note:     Date:  03/22/2022    ID:  Erika Anderson, DOB 1950-11-23, MRN [id number]   PCP:  Venita Lick, NP             [facility] Cardiologist:  None  [facility] Electrophysiologist:  Vickie Epley, MD      Interval History:     Erika Anderson is a 71 y.o. female who presents for a follow up visit. They were last seen in clinic Oct 07, 2021 for persistent atrial fibrillation.  The patient is symptomatic with her atrial fibrillation.  At the last visit we ordered a 2-week ZIO monitor to review the overall burden of her atrial fibrillation.   She tells me she continues to have fatigue episodes.  She continues to take her metoprolol and Eliquis without any bleeding issues.  She has a sleep consultation on Friday.   Presents today for PVI.     Objective      Past Medical History:  Diagnosis Date   Atrial fibrillation (HCC)     Hyperlipidemia     Obesity (BMI 30-39.9)     Restless legs syndrome (RLS) 12/28/2014   RLS (restless legs syndrome)             Past Surgical History:  Procedure Laterality Date   COLONOSCOPY WITH PROPOFOL N/A 08/24/2021    Procedure: COLONOSCOPY WITH PROPOFOL;  Surgeon: Jonathon Bellows, MD;  Location: [facility] ENDOSCOPY;  Service: Gastroenterology;  Laterality: N/A;   HEMORROIDECTOMY       TOTAL ABDOMINAL HYSTERECTOMY        ovaries remain      Current Medications: Active Medications      Current Meds  Medication Sig   apixaban (ELIQUIS) 5 MG TABS tablet Take 1 tablet (5 mg total) by mouth 2 (two) times daily.   Cholecalciferol (D3) 50 MCG (2000 UT) TABS Take 2,000 tablets by mouth daily.   citalopram (CELEXA) 20 MG tablet Take 1 tablet (20 mg total) by mouth daily.   Ferrous Sulfate (IRON) 325 (65 Fe) MG TABS Take 325 mg by mouth daily.   gabapentin (NEURONTIN) 300 MG capsule Take 2 capsules (600 mg total) by mouth at bedtime. Take 1 capsule by mouth every night.   magnesium oxide (MAG-OX)  400 MG tablet Take 400 mg by mouth daily.   methocarbamol (ROBAXIN) 500 MG tablet Take 250-500 mg by mouth at bedtime as needed.   metoprolol tartrate (LOPRESSOR) 50 MG tablet Take 1 tablet (50 mg total) by mouth 2 (two) times daily.   rOPINIRole (REQUIP) 1 MG tablet Take 1 tablet (1 mg total) by mouth 4 (four) times daily.   rosuvastatin (CRESTOR) 10 MG tablet Take 1 tablet (10 mg total) by mouth daily.   traZODone (DESYREL) 50 MG tablet TAKE 1/2 TO 1 (ONE-HALF TO ONE) TABLET BY MOUTH AT BEDTIME AS NEEDED FOR SLEEP   zinc gluconate 50 MG tablet Take 50 mg by mouth daily.        Allergies:   Patient has no known allergies.    Social History         Socioeconomic History   Marital status: Married      Spouse name: Not on file   Number of children: Not on file   Years of education: 12   Highest education level: High school graduate  Occupational History   Occupation: retired  Tobacco Use   Smoking status: Never   Smokeless tobacco:  Never  Vaping Use   Vaping Use: Never used  Substance and Sexual Activity   Alcohol use: No      Alcohol/week: 0.0 standard drinks of alcohol   Drug use: No   Sexual activity: Not Currently  Other Topics Concern   Not on file  Social History Narrative    Helps take care of grandchildren     Social Determinants of Health        Financial Resource Strain: Low Risk  (02/27/2021)    Overall Financial Resource Strain (CARDIA)     Difficulty of Paying Living Expenses: Not hard at all  Food Insecurity: No Food Insecurity (02/27/2021)    Hunger Vital Sign     Worried About Running Out of Food in the Last Year: Never true     Ran Out of Food in the Last Year: Never true  Transportation Needs: No Transportation Needs (02/27/2021)    PRAPARE - Armed forces logistics/support/administrative officer (Medical): No     Lack of Transportation (Non-Medical): No  Physical Activity: Sufficiently Active (02/27/2021)    Exercise Vital Sign     Days of Exercise per Week: 7  days     Minutes of Exercise per Session: 30 min  Stress: No Stress Concern Present (02/27/2021)    [place]     Feeling of Stress : Not at all  Social Connections: Moderately Integrated (02/27/2021)    Social Connection and Isolation Panel [NHANES]     Frequency of Communication with Friends and Family: More than three times a week     Frequency of Social Gatherings with Friends and Family: More than three times a week     Attends Religious Services: More than 4 times per year     Active Member of [employer] or Organizations: No     Attends Music therapist: Never     Marital Status: Married      Family History: The patient's family history includes Arthritis in her sister; Cancer in her mother and paternal grandfather; Dementia in her father; Heart disease in her brother; Stroke in her paternal grandmother. There is no history of Breast cancer.   ROS:   Please see the history of present illness.    All other systems reviewed and are negative.   EKGs/Labs/Other Studies Reviewed:     The following studies were reviewed today:   10/29/2021 Zio monitor HR 43 - 186 bpm, average 71 bpm. 29 episodes of SVT, longest lasting 17.1 seconds with an average rate of 102 bpm. 26% burden of atrial fibrillation (ventricular rates 73 - 186, average 113 bpm) Longest episode of AF lasted 25 hours Symptom triggered episodes correspond to AF. Rare supraventricular and ventricular ectopy. Evidence of atrial fibrillation and flutter on monitor   EKG today shows sinus bradycardia with a ventricular rate of 53 bpm.   Recent Labs: 07/30/2021: ALT 19; TSH 2.120 10/09/2021: BUN 20; Creatinine, Ser 0.97; Hemoglobin 13.7; Platelets 204; Potassium 4.6; Sodium 141  Recent Lipid Panel Labs (Brief)          Component Value Date/Time    CHOL 148 10/09/2021 0922    TRIG 87 10/09/2021 0922    HDL 54 10/09/2021 0922    LDLCALC 78  10/09/2021 0922        Physical Exam:     VS:  BP 140/72   Pulse 61   Ht '5\' 6"'$  (1.676 m)   Wt 240  lb 9.6 oz (109.1 kg)   SpO2 96%   BMI 38.83 kg/m         Wt Readings from Last 3 Encounters:  11/25/21 240 lb 9.6 oz (109.1 kg)  11/13/21 240 lb (108.9 kg)  10/09/21 239 lb 3.2 oz (108.5 kg)      GEN:  Well nourished, well developed in no acute distress.  Obese HEENT: Normal NECK: No JVD; No carotid bruits LYMPHATICS: No lymphadenopathy CARDIAC: RRR, no murmurs, rubs, gallops RESPIRATORY:  Clear to auscultation without rales, wheezing or rhonchi  ABDOMEN: Soft, non-tender, non-distended MUSCULOSKELETAL:  No edema; No deformity  SKIN: Warm and dry NEUROLOGIC:  Alert and oriented x 3 PSYCHIATRIC:  Normal affect            Assessment ASSESSMENT:     1. Persistent atrial fibrillation ([place])   2. Atrial flutter, unspecified type (HCC)   3. Class 2 obesity due to excess calories with body mass index (BMI) of 37.0 to 37.9 in adult, unspecified whether serious comorbidity present     PLAN:     In order of problems listed above:     #Paroxysmal atrial fibrillation Symptomatic.  Relatively high burden of atrial fibrillation.  Discussed treatment options today for his AF including antiarrhythmic drug therapy and ablation. Discussed risks, recovery and likelihood of success. Discussed potential need for repeat ablation procedures and antiarrhythmic drugs after an initial ablation. They wish to proceed with scheduling.   Risk, benefits, and alternatives to EP study and radiofrequency ablation for afib were also discussed in detail today. These risks include but are not limited to stroke, bleeding, vascular damage, tamponade, perforation, damage to the esophagus, lungs, and other structures, pulmonary vein stenosis, worsening renal function, and death. The patient understands these risk and wishes to proceed.  We will therefore proceed with catheter ablation at the next available  time.  Carto, ICE, anesthesia are requested for the procedure.  Will also obtain CT PV protocol prior to the procedure to exclude LAA thrombus and further evaluate atrial anatomy.   Ablation strategy will be PVI plus CTI.  I will assess the electrical health of the posterior wall during the procedure.   #Possible sleep disordered breathing Encouraged her to keep her appointment for the sleep consultation on Friday.   Presents today for PVI. Procedure reviewed.     Signed, Lars Mage, MD, [facility], [facility] 03/22/2022 Electrophysiology [facility]

## 2022-03-22 NOTE — Anesthesia Procedure Notes (Signed)
Procedure Name: Intubation Date/Time: 03/22/2022 7:46 AM  Performed by: Valda Favia, CRNAPre-anesthesia Checklist: Patient identified, Emergency Drugs available, Suction available and Patient being monitored Patient Re-evaluated:Patient Re-evaluated prior to induction Oxygen Delivery Method: Circle System Utilized Preoxygenation: Pre-oxygenation with 100% oxygen Induction Type: IV induction Ventilation: Oral airway inserted - appropriate to patient size and Two handed mask ventilation required Laryngoscope Size: Mac and 4 Grade View: Grade II Tube type: Oral Tube size: 7.0 mm Number of attempts: 2 Airway Equipment and Method: Stylet and Oral airway Placement Confirmation: ETT inserted through vocal cords under direct vision, positive ETCO2 and breath sounds checked- equal and bilateral Secured at: 22 cm Tube secured with: Tape Dental Injury: Teeth and Oropharynx as per pre-operative assessment  Comments: DLx1 by Edmonia James, CRNA Grade II view unable to pass ETT due to inadequate paralyzation and small glottic opening, resumed mask ventilation with oral airway DL x 2 by Edmonia James, CRNA Grade II view successful passing of ETT atraumatic

## 2022-03-22 NOTE — Discharge Instructions (Signed)

## 2022-03-23 ENCOUNTER — Other Ambulatory Visit: Payer: Self-pay | Admitting: Nurse Practitioner

## 2022-03-23 ENCOUNTER — Encounter (HOSPITAL_COMMUNITY): Payer: Self-pay | Admitting: Cardiology

## 2022-03-23 NOTE — Telephone Encounter (Signed)
Requested Prescriptions  Pending Prescriptions Disp Refills  . ELIQUIS 5 MG TABS tablet [Pharmacy Med Name: Eliquis 5 MG Oral Tablet] 180 tablet 1    Sig: Take 1 tablet by mouth twice daily     Hematology:  Anticoagulants - apixaban Passed - 03/23/2022  7:31 AM      Passed - PLT in normal range and within 360 days    Platelets  Date Value Ref Range Status  03/15/2022 208 150 - 400 K/uL Final  02/19/2022 199 150 - 450 x10E3/uL Final         Passed - HGB in normal range and within 360 days    Hemoglobin  Date Value Ref Range Status  03/15/2022 13.8 12.0 - 15.0 g/dL Final  02/19/2022 14.0 11.1 - 15.9 g/dL Final         Passed - HCT in normal range and within 360 days    HCT  Date Value Ref Range Status  03/15/2022 40.5 36.0 - 46.0 % Final   Hematocrit  Date Value Ref Range Status  02/19/2022 41.9 34.0 - 46.6 % Final         Passed - Cr in normal range and within 360 days    Creatinine, Ser  Date Value Ref Range Status  03/15/2022 0.75 0.44 - 1.00 mg/dL Final         Passed - AST in normal range and within 360 days    AST  Date Value Ref Range Status  03/15/2022 31 15 - 41 U/L Final         Passed - ALT in normal range and within 360 days    ALT  Date Value Ref Range Status  03/15/2022 30 0 - 44 U/L Final         Passed - Valid encounter within last 12 months    Recent Outpatient Visits          1 month ago Upper respiratory tract infection, unspecified type   [place], Megan P, DO   1 month ago Paroxysmal atrial fibrillation ([place])   [place], Jolene T, NP   3 months ago Paroxysmal atrial fibrillation ([place])   [place], Jolene T, NP   5 months ago Atrial flutter, unspecified type ([place])   [place], Jolene T, NP   5 months ago Irregular heartbeat   [place], Barbaraann Faster, NP      Future Appointments            In 3 weeks Cannady, Barbaraann Faster, NP  [employer], [place]   In 1 month Agbor-Etang, Aaron Edelman, MD [facility]. [place]   In 3 months Vickie Epley, MD [facility]. [place]   In 4 months Cannady, Barbaraann Faster, NP [employer], [facility]

## 2022-03-23 NOTE — Anesthesia Postprocedure Evaluation (Signed)
Anesthesia Post Note  Patient: Erika Anderson [facility]  Procedure(s) Performed: ATRIAL FIBRILLATION ABLATION     Patient location during evaluation: PACU Anesthesia Type: General Level of consciousness: sedated and patient cooperative Pain management: pain level controlled Vital Signs Assessment: post-procedure vital signs reviewed and stable Respiratory status: spontaneous breathing Cardiovascular status: stable Anesthetic complications: no   No notable events documented.  Last Vitals:  Vitals:   03/22/22 1350 03/22/22 1355  BP:    Pulse: 63 63  Resp: 12 12  Temp:    SpO2: 92% 92%    Last Pain:  Vitals:   03/23/22 1402  TempSrc:   PainSc: [place]

## 2022-03-24 ENCOUNTER — Telehealth: Payer: Self-pay

## 2022-03-24 NOTE — Telephone Encounter (Signed)
-----   Message from Earlie Server, MD sent at 03/23/2022  9:52 PM EDT ----- Her work up results are back.  US abdomen pending.  Please arrange her to have MD only a few days after [place]. Thanks.

## 2022-03-24 NOTE — Telephone Encounter (Signed)
Please schedule MD only next week for lab and [place] results. Please inform pt of appt. Thanks

## 2022-03-26 ENCOUNTER — Ambulatory Visit
Admission: RE | Admit: 2022-03-26 | Discharge: 2022-03-26 | Disposition: A | Discharge status: Home / Self Care | Payer: Medicare HMO | Source: Ambulatory Visit | Attending: Oncology | Admitting: Oncology

## 2022-03-26 DIAGNOSIS — D72820 Lymphocytosis (symptomatic): Secondary | ICD-10-CM | POA: Insufficient documentation

## 2022-03-26 DIAGNOSIS — R61 Generalized hyperhidrosis: Secondary | ICD-10-CM | POA: Diagnosis not present

## 2022-04-01 ENCOUNTER — Inpatient Hospital Stay: Payer: Medicare HMO | Admitting: Oncology

## 2022-04-01 ENCOUNTER — Encounter: Payer: Self-pay | Admitting: Oncology

## 2022-04-01 DIAGNOSIS — Z7189 Other specified counseling: Secondary | ICD-10-CM | POA: Insufficient documentation

## 2022-04-01 DIAGNOSIS — G2581 Restless legs syndrome: Secondary | ICD-10-CM | POA: Diagnosis not present

## 2022-04-01 DIAGNOSIS — C9110 Chronic lymphocytic leukemia of B-cell type not having achieved remission: Secondary | ICD-10-CM | POA: Diagnosis not present

## 2022-04-01 DIAGNOSIS — Z7901 Long term (current) use of anticoagulants: Secondary | ICD-10-CM | POA: Diagnosis not present

## 2022-04-01 DIAGNOSIS — D72820 Lymphocytosis (symptomatic): Secondary | ICD-10-CM | POA: Diagnosis not present

## 2022-04-01 DIAGNOSIS — Z8041 Family history of malignant neoplasm of ovary: Secondary | ICD-10-CM | POA: Diagnosis not present

## 2022-04-01 DIAGNOSIS — Z90710 Acquired absence of both cervix and uterus: Secondary | ICD-10-CM | POA: Diagnosis not present

## 2022-04-01 DIAGNOSIS — Z801 Family history of malignant neoplasm of trachea, bronchus and lung: Secondary | ICD-10-CM | POA: Diagnosis not present

## 2022-04-01 NOTE — Progress Notes (Signed)
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 Rapid lymphocyte increment, 50% over 2 months period Given that she has already had disease related complication.  Recommend Zanubrutinib  She is on Elqiuis, will watch for possible increase risk of bleeding.  Check insurance approval.   Goals of care, counseling/discussion Discussed with patient.    Follow up to be determined.     Earlie Server, MD, PhD [facility] Hematology Oncology 04/01/2022       CHIEF COMPLAINTS/PURPOSE OF CONSULTATION:  CLL  HISTORY OF PRESENTING ILLNESS:  Erika Anderson 71 y.o. female presents to follow up with CLL.  She was found to have abnormal CBC on 03/01/22, total white count of 15, predominantly lymphocytosis. This is chronic since Feb 2023. Peripheral blood smear + smudge cells.  + Restless leg syndrome, sleep disturbance for 1 year.  + Family history of ovarian cancer -mother.    Oncology History  CLL (chronic lymphocytic leukemia) ([place])  03/02/2022 Initial Diagnosis   CLL (chronic lymphocytic leukemia)  -03/15/22 Flowcytometry showed chronic lymphocytic leukemia, B cell, CD38 expression indeterminate for prognosis (23%) and CD20 positive.  45% nuclei positive for TP53 gene deletion.    03/26/2022 Imaging   US abdomen 1. No sonographic etiology for night sweats identified. 2. Spleen is at the upper limits of normal in size    04/01/2022 Cancer Staging   Staging form: Chronic Lymphocytic Leukemia / Small Lymphocytic Lymphoma, AJCC 8th Edition - Clinical: Modified Rai Stage 0 (Modified Rai risk: Low, Lymphocytosis:  Present, Adenopathy: Absent, Organomegaly: Absent, Anemia: Absent, Thrombocytopenia: Absent) - Signed by Earlie Server, MD on 04/01/2022 Stage prefix: Initial diagnosis Absolute lymphocyte count (ALC) (cells/uL): 18        MEDICAL HISTORY:  Past Medical History:  Diagnosis Date   Atrial fibrillation ([place])    Hyperlipidemia    Obesity (BMI 30-39.9)    Restless legs syndrome (RLS) 12/28/2014   RLS (restless legs syndrome)     SURGICAL HISTORY: Past Surgical History:  Procedure Laterality Date   ATRIAL FIBRILLATION ABLATION N/A 03/22/2022   Procedure: ATRIAL FIBRILLATION ABLATION;  Surgeon: Vickie Epley, MD;  Location: [facility] [place] [facility];  Service: Cardiovascular;  Laterality: N/A;   COLONOSCOPY WITH PROPOFOL N/A 08/24/2021   Procedure: COLONOSCOPY WITH PROPOFOL;  Surgeon: Jonathon Bellows, MD;  Location: [facility] ENDOSCOPY;  Service: Gastroenterology;  Laterality: N/A;   HEMORROIDECTOMY     TOTAL ABDOMINAL HYSTERECTOMY     ovaries remain    SOCIAL HISTORY: Social History   Socioeconomic History   Marital status: Married    Spouse name: Not on file   Number of children: Not on file   Years of education: 12   Highest education level: High school graduate  Occupational History   Occupation: retired  Tobacco Use   Smoking status: Never   Smokeless tobacco: Never  Vaping Use   Vaping Use: Never used  Substance and Sexual Activity   Alcohol use: No    Alcohol/week: 0.0 standard drinks of alcohol   Drug use: No   Sexual activity: Not Currently  Other Topics Concern   Not on file  Social History Narrative   Helps take care of grandchildren  Social Determinants of Health   Financial Resource Strain: Low Risk  (03/02/2022)   Overall Financial Resource Strain (CARDIA)    Difficulty of Paying Living Expenses: Not hard at all  Food Insecurity: No Food Insecurity (03/02/2022)   Hunger Vital Sign    Worried About Running Out of Food in the Last Year: Never true    Ran Out of  Food in the Last Year: Never true  Transportation Needs: No Transportation Needs (02/27/2021)   PRAPARE - Hydrologist (Medical): No    Lack of Transportation (Non-Medical): No  Physical Activity: Inactive (03/02/2022)   Exercise Vital Sign    Days of Exercise per Week: 0 days    Minutes of Exercise per Session: 0 min  Stress: No Stress Concern Present (03/02/2022)   [place]    Feeling of Stress : Not at all  Social Connections: [place] (03/02/2022)   Social Connection and Isolation Panel [NHANES]    Frequency of Communication with Friends and Family: More than three times a week    Frequency of Social Gatherings with Friends and Family: Twice a week    Attends Religious Services: More than 4 times per year    Active Member of [employer] or Organizations: Yes    Attends Music therapist: More than 4 times per year    Marital Status: Married  Human resources officer Violence: Not At Risk (03/02/2022)   Humiliation, Afraid, Rape, and Kick questionnaire    Fear of Current or Ex-Partner: No    Emotionally Abused: No    Physically Abused: No    Sexually Abused: No    FAMILY HISTORY: Family History  Problem Relation Age of Onset   Cancer Mother        ovarian   Dementia Father    Arthritis Sister    Heart disease Brother        MI   Stroke Paternal Grandmother    Cancer Paternal Grandfather        lung   Breast cancer Neg Hx     ALLERGIES:  has No Known Allergies.  MEDICATIONS:  Current Outpatient Medications  Medication Sig Dispense Refill   citalopram (CELEXA) 20 MG tablet Take 1 tablet (20 mg total) by mouth daily. 90 tablet 4   ELIQUIS 5 MG TABS tablet Take 1 tablet by mouth twice daily 180 tablet 1   gabapentin (NEURONTIN) 300 MG capsule Take 2 capsules (600 mg total) by mouth at bedtime. (Patient taking differently: Take 300 mg by mouth at bedtime as needed  (pain).) 180 capsule 4   metoprolol tartrate (LOPRESSOR) 50 MG tablet Take 1 tablet (50 mg total) by mouth 2 (two) times daily. (Patient taking differently: Take 25 mg by mouth 2 (two) times daily.) 60 tablet 3   Multiple Vitamins-Minerals (MULTIVITAMIN WITH MINERALS) tablet Take 1 tablet by mouth daily.     pantoprazole (PROTONIX) 40 MG tablet Take 1 tablet (40 mg total) by mouth daily. 45 tablet 0   rOPINIRole (REQUIP) 1 MG tablet Take 1 tablet (1 mg total) by mouth 4 (four) times daily. 360 tablet 4   rosuvastatin (CRESTOR) 10 MG tablet Take 1 tablet (10 mg total) by mouth daily. 90 tablet 3   chlorpheniramine-HYDROcodone (TUSSIONEX) 10-8 MG/5ML Take 5 mLs by mouth every 12 (twelve) hours as needed for cough. (Patient not taking: Reported on 03/15/2022) 70 mL 0   Coenzyme Q10 200 MG capsule Take 200  mg by mouth daily. (Patient not taking: Reported on 04/01/2022)     colchicine 0.6 MG tablet Take 1 tablet (0.6 mg total) by mouth 2 (two) times daily for 5 days. (Patient not taking: Reported on 04/01/2022) 10 tablet 0   methocarbamol (ROBAXIN) 500 MG tablet Take 500 mg by mouth at bedtime as needed for muscle spasms. (Patient not taking: Reported on 04/01/2022)     traZODone (DESYREL) 50 MG tablet TAKE 1/2 TO 1 (ONE-HALF TO ONE) TABLET BY MOUTH AT BEDTIME AS NEEDED FOR SLEEP (Patient not taking: Reported on 04/01/2022) 90 tablet 4   No current facility-administered medications for this visit.    Review of Systems  Constitutional:  Negative for appetite change, chills, fatigue and fever.  HENT:   Negative for hearing loss and voice change.   Eyes:  Negative for eye problems.  Respiratory:  Negative for chest tightness and cough.   Cardiovascular:  Negative for chest pain.  Gastrointestinal:  Negative for abdominal distention, abdominal pain and blood in stool.  Endocrine: Negative for hot flashes.       Night sweat  Genitourinary:  Negative for difficulty urinating and frequency.    Musculoskeletal:  Negative for arthralgias.  Skin:  Negative for itching and rash.  Neurological:  Negative for extremity weakness.  Hematological:  Negative for adenopathy.  Psychiatric/Behavioral:  Positive for sleep disturbance. Negative for confusion.      PHYSICAL EXAMINATION: ECOG PERFORMANCE STATUS: 1 - Symptomatic but completely ambulatory  Vitals:   04/01/22 1018  BP: 125/73  Pulse: 70  Resp: 18  Temp: (!) 97.3 F (36.3 C)  SpO2: 100%   Filed Weights   04/01/22 1018  Weight: 250 lb 9.6 oz (113.7 kg)    Physical Exam Constitutional:      General: She is not in acute distress.    Appearance: She is obese. She is not diaphoretic.  HENT:     Head: Normocephalic and atraumatic.     Nose: Nose normal.     Mouth/Throat:     Pharynx: No oropharyngeal exudate.  Eyes:     General: No scleral icterus.    Pupils: Pupils are equal, round, and reactive to light.  Cardiovascular:     Rate and Rhythm: Normal rate and regular rhythm.     Heart sounds: No murmur heard. Pulmonary:     Effort: Pulmonary effort is normal. No respiratory distress.     Breath sounds: No rales.  Chest:     Chest wall: No tenderness.  Abdominal:     General: There is no distension.     Palpations: Abdomen is soft.     Tenderness: There is no abdominal tenderness.  Musculoskeletal:        General: Normal range of motion.     Cervical back: Normal range of motion and neck supple.  Skin:    General: Skin is warm and dry.     Findings: No erythema.  Neurological:     Mental Status: She is alert and oriented to person, place, and time.     Cranial Nerves: No cranial nerve deficit.     Motor: No abnormal muscle tone.     Coordination: Coordination normal.  Psychiatric:        Mood and Affect: Affect normal.     LABORATORY DATA:  I have reviewed the data as listed    Latest Ref Rng & Units 03/15/2022   10:03 AM 03/01/2022   10:47 AM 02/19/2022    9:17 AM  CBC  WBC 4.0 - 10.5 K/uL 18.2   15.0  16.8   Hemoglobin 12.0 - 15.0 g/dL 13.8  13.3  14.0   Hematocrit 36.0 - 46.0 % 40.5  39.3  41.9   Platelets 150 - 400 K/uL 208  184  199       Latest Ref Rng & Units 03/15/2022   10:03 AM 03/01/2022   10:47 AM 01/27/2022    9:05 AM  CMP  Glucose 70 - 99 mg/dL 99  97  82   BUN 8 - 23 mg/dL '20  24  22   ' Creatinine 0.44 - 1.00 mg/dL 0.75  0.76  0.91   Sodium 135 - 145 mmol/L 138  141  142   Potassium 3.5 - 5.1 mmol/L 4.9  4.1  4.3   Chloride 98 - 111 mmol/L 104  108  102   CO2 22 - 32 mmol/L '30  27  24   ' Calcium 8.9 - 10.3 mg/dL 9.3  9.3  9.6   Total Protein 6.5 - 8.1 g/dL 7.0   6.5   Total Bilirubin 0.3 - 1.2 mg/dL 0.6   0.4   Alkaline Phos 38 - 126 U/L 78   91   AST 15 - 41 U/L 31   22   ALT 0 - 44 U/L 30   23    Lab Results  Component Value Date   LDH 192 03/15/2022    RADIOGRAPHIC STUDIES: I have personally reviewed the radiological images as listed and agreed with the findings in the report. US Abdomen Complete  Result Date: 03/27/2022 CLINICAL DATA:  Night Sweats, Lymphocytosis EXAM: ABDOMEN ULTRASOUND COMPLETE COMPARISON:  None Available. FINDINGS: Gallbladder: No gallstones or wall thickening visualized. No sonographic Murphy sign noted by sonographer. Common bile duct: Diameter: Visualized portion measures 4 mm, within normal limits. Liver: No focal lesion identified. Within normal limits in parenchymal echogenicity. Portal vein is patent on color Doppler imaging with normal direction of blood flow towards the liver. IVC: No abnormality visualized. Pancreas: Visualized portion unremarkable. Majority not well visualized secondary to shadowing bowel gas. Spleen: Spleen is estimated to measure measures 10.1 x 12.2 By 4.1 Cm for an estimated volume of 278 ML. This is at the upper limits of normal. Right Kidney: Length: 11.3 cm. Echogenicity within normal limits. No mass or hydronephrosis visualized. Left Kidney: Length: 10.4 cm. Echogenicity within normal limits. No mass or  hydronephrosis visualized. Abdominal aorta: No aneurysm visualized. Other findings: None. IMPRESSION: 1. No sonographic etiology for night sweats identified. 2. Spleen is at the upper limits of normal in size. Electronically Signed   By: Valentino Saxon M.D.   On: 03/27/2022 10:42   EP STUDY  Result Date: 03/22/2022 CONCLUSIONS: 1. Successful PVI 2. Successful ablation of the cavotricuspid isthmus for atrial flutter 3. Intracardiac echo reveals trivial pericardial effusion and normal left atrial architecture 4. No early apparent complications. 5. Colchicine 0.67m PO BID x 5 days 6. Protonix 439mPO daily x 45 days   CT CARDIAC MORPH/PULM VEIN W/CM&W/O CA SCORE  Addendum Date: 03/18/2022   ADDENDUM REPORT: 03/18/2022 17:15 EXAM: OVER-READ INTERPRETATION  CT CHEST The following report is an over-read performed by radiologist Dr. [facility], [facility] on 03/18/2022. This over-read does not include interpretation of cardiac or coronary anatomy or pathology. The coronary CTA interpretation by the cardiologist is attached. COMPARISON:  None. FINDINGS: Vascular: No significant extracardiac vascular findings. Mediastinum/Nodes: No lymphadenopathy. Lungs/Pleura: Lingular and right middle lobe subsegmental atelectasis. No  suspicious pulmonary nodules. Upper Abdomen: Moderate-sized hiatal hernia. Musculoskeletal: No acute osseous abnormality. No suspicious osseous lesion. IMPRESSION: Moderate size hiatal hernia. No suspicious pulmonary nodules. Electronically Signed   By: Maurine Simmering M.D.   On: 03/18/2022 17:15   Result Date: 03/18/2022 CLINICAL DATA:  Atrial fibrillation/flutter scheduled for an ablation. EXAM: Cardiac CT/CTA TECHNIQUE: The patient was scanned on a Siemens Somatom scanner. FINDINGS: A 100 kV prospective scan was triggered in the ascending thoracic aorta at 150 HU's. Gantry rotation speed was 165 msecs and collimation was .6 mm. No beta blockade and no NTG was given. The 3D data  set was reconstructed in 5% intervals of the 60-80 % of the R-R cycle. Diastolic phases were analyzed on a dedicated work station using MPR, MIP and VRT modes. The patient received 80 cc of contrast. There is normal pulmonary vein drainage into the left atrium (2 on the right and 2 on the left) with ostial measurements as follows: RUPV: 21 x 16 mm. Area 26 mm2 RLPV: 19 x 18 mm, Area 25 mm2 LUPV: 21.8 x 15  mm, Area 27 mm2 LLPV: 16 x 15 mm, Area 19 mm2 The left atrial appendage is a Windsock type with ostial size 31 x 22 mm and length 58m. There is no thrombus in the left atrial appendage. The esophagus runs in the left atrial midline and is not in the proximity to any of the pulmonary veins. Aorta:  Normal caliber.  No dissection or calcifications. Aortic Valve:  Trileaflet.  No calcifications. Coronary Arteries: Normal coronary origin. Right dominance. The study was performed without use of NTG and insufficient for plaque evaluation. IMPRESSION: 1. There is normal pulmonary vein drainage into the left atrium (2 on the right and 2 on the left) 2. The left atrial appendage is a Windsock type with ostial size 31 x 22 mm and length 335m There is no thrombus in the left atrial appendage. 3. The esophagus runs in the left atrial midline and is not in the proximity to any of the pulmonary veins. Electronically Signed: By: BrKate Sable.D. On: 03/18/2022 14:48

## 2022-04-01 NOTE — Assessment & Plan Note (Addendum)
CLL +TP53 mutation, night sweat daily. Rapid lymphocyte increment, 50% over 2 months period Given that she has already had disease related complication.  Recommend Zanubrutinib  She is on Elqiuis, will watch for possible increase risk of bleeding.  Check insurance approval.

## 2022-04-01 NOTE — Assessment & Plan Note (Signed)
Discussed with patient

## 2022-04-05 ENCOUNTER — Encounter (INDEPENDENT_AMBULATORY_CARE_PROVIDER_SITE_OTHER): Payer: Self-pay

## 2022-04-11 NOTE — Patient Instructions (Signed)

## 2022-04-13 ENCOUNTER — Telehealth: Payer: Self-pay

## 2022-04-13 ENCOUNTER — Telehealth: Payer: Self-pay | Admitting: Pharmacist

## 2022-04-13 ENCOUNTER — Other Ambulatory Visit (HOSPITAL_COMMUNITY): Payer: Self-pay

## 2022-04-13 ENCOUNTER — Telehealth: Payer: Self-pay | Admitting: Oncology

## 2022-04-13 DIAGNOSIS — C9110 Chronic lymphocytic leukemia of B-cell type not having achieved remission: Secondary | ICD-10-CM

## 2022-04-13 MED ORDER — ZANUBRUTINIB 80 MG PO CAPS
160.0000 mg | ORAL_CAPSULE | Freq: Two times a day (BID) | ORAL | 1 refills | Status: DC
  Filled 2022-04-13 – 2022-04-16 (×2): qty 120, 30d supply, fill #0
  Filled 2022-05-06: qty 120, 30d supply, fill #1

## 2022-04-13 NOTE — Telephone Encounter (Signed)
Oral Oncology Pharmacist Encounter  Received new prescription for Brukinsa (zanubrutinib) for the treatment of newly diagnosed CLL, planned duration until disease progression or unacceptable drug toxicity.  CMP/CBC from 03/15/22 assessed, no relevant lab abnormalities. Prescription dose and frequency assessed.   Current medication list in Epic reviewed, a few DDIs with zanubrutinib identified: Citalopram: Zanubrutinib may enhance the antiplatelet effect of Agents with Antiplatelet Properties, like citalopram. Monitor patients for signs and symptoms of bleeding. No baseline adjustments needed. Apixaban: Zanubrutinib may enhance the adverse/toxic effect of Anticoagulants. Monitor patients for signs and symptoms of bleeding. No baseline adjustments needed.  Evaluated chart and no patient barriers to medication adherence identified.   Prescription has been e-scribed to the [facility] for benefits analysis and approval.  Oral Oncology Clinic will continue to follow for insurance authorization, copayment issues, initial counseling and start date.   Darl Pikes, PharmD, BCPS, BCOP, CPP Hematology/Oncology Clinical Pharmacist Practitioner [place]/DB/AP Oral [facility] [phone]  04/13/2022 9:03 AM

## 2022-04-13 NOTE — Telephone Encounter (Signed)
Notified pt of scheduled appts, per [place]

## 2022-04-13 NOTE — Telephone Encounter (Signed)
Oral Oncology Patient Advocate Encounter  Submitted online application for copay assistance to the [employer] (LLS).    Application is currently pending review.  This process can take up to 10 days to be completed.  I will continue to check the portal to for status update until final determination.  Berdine Addison, [facility] Patient [place]  [phone] (phone) [phone] (fax) 04/13/2022 9:58 AM

## 2022-04-13 NOTE — Telephone Encounter (Signed)
Oral Oncology Patient Advocate Encounter   Received notification that prior authorization for Brukinsa is required.   PA submitted on 11.07.23  Key [id number]  Status is pending     Berdine Addison, [place] Patient [place]  [phone] (phone) [phone] (fax) 04/13/2022 9:09 AM

## 2022-04-13 NOTE — Telephone Encounter (Signed)
Oral Oncology Patient Advocate Encounter  Prior Authorization for Brukinsa has been approved.    PA# [id number] Effective dates: 01.01.23 through 12.31.23  Patients co-pay is $2044.37.    Berdine Addison, [facility] Patient [place]  [phone] (phone) [phone] (fax) 04/13/2022 9:27 AM

## 2022-04-13 NOTE — Telephone Encounter (Addendum)
Oral Chemotherapy Pharmacist Encounter  Patient will start her Brukinsa on 04/19/22  Patient Education I spoke with patient for overview of new oral chemotherapy medication: Brukinsa (zanubrutinib) for the treatment of newly diagnosed CLL, planned duration until disease progression or unacceptable drug toxicity.   Pt is doing well. Counseled patient on administration, dosing, side effects, monitoring, drug-food interactions, safe handling, storage, and disposal. Patient will take 2 capsules (160 mg total) by mouth 2 (two) times daily. .  Side effects include but not limited to: rash/itchy skin, fatigue, decreased wbc/hgb/plt.    Reviewed with patient importance of keeping a medication schedule and plan for any missed doses.  After discussion with patient no patient barriers to medication adherence identified.   Ms. Laury voiced understanding and appreciation. All questions answered. Medication handout provided.  Provided patient with Oral [facility] phone number. Patient knows to call the office with questions or concerns. Oral Chemotherapy Navigation Clinic will continue to follow.  Darl Pikes, PharmD, BCPS, BCOP, CPP Hematology/Oncology Clinical Pharmacist Practitioner [place]/DB/AP Oral [facility] [phone]  04/13/2022 2:21 PM

## 2022-04-13 NOTE — Telephone Encounter (Signed)
Oral Oncology Patient Advocate Encounter   Was successful in securing patient a $10,000 grant from [employer]uk[employer] Lymphoma [employer] (LLS) to provide copayment coverage for Brukinsa.  This will keep the out of pocket expense at $0.     I have spoken with the patient.  The billing information is as follows and has been shared with [facility].   Member ID: [id number] Group ID: [id number] RxBin: [id number] Dates of Eligibility: 01/13/2022 through 04/13/2023  Fund:  [place], [facility] Patient [place]  [phone] (phone) [phone] (fax) 04/13/2022 10:00 AM

## 2022-04-14 ENCOUNTER — Ambulatory Visit (INDEPENDENT_AMBULATORY_CARE_PROVIDER_SITE_OTHER): Payer: Medicare HMO | Admitting: Nurse Practitioner

## 2022-04-14 ENCOUNTER — Encounter: Payer: Self-pay | Admitting: Nurse Practitioner

## 2022-04-14 VITALS — BP 134/84 | HR 60 | Temp 97.9°F | Ht 66.0 in | Wt 254.5 lb

## 2022-04-14 DIAGNOSIS — Z23 Encounter for immunization: Secondary | ICD-10-CM | POA: Diagnosis not present

## 2022-04-14 DIAGNOSIS — C9110 Chronic lymphocytic leukemia of B-cell type not having achieved remission: Secondary | ICD-10-CM | POA: Diagnosis not present

## 2022-04-14 DIAGNOSIS — F5104 Psychophysiologic insomnia: Secondary | ICD-10-CM

## 2022-04-14 DIAGNOSIS — G2581 Restless legs syndrome: Secondary | ICD-10-CM | POA: Diagnosis not present

## 2022-04-14 DIAGNOSIS — E559 Vitamin D deficiency, unspecified: Secondary | ICD-10-CM

## 2022-04-14 DIAGNOSIS — I700 Atherosclerosis of aorta: Secondary | ICD-10-CM | POA: Diagnosis not present

## 2022-04-14 DIAGNOSIS — Z6841 Body Mass Index (BMI) 40.0 and over, adult: Secondary | ICD-10-CM

## 2022-04-14 DIAGNOSIS — E782 Mixed hyperlipidemia: Secondary | ICD-10-CM | POA: Diagnosis not present

## 2022-04-14 DIAGNOSIS — R69 Illness, unspecified: Secondary | ICD-10-CM | POA: Diagnosis not present

## 2022-04-14 DIAGNOSIS — I480 Paroxysmal atrial fibrillation: Secondary | ICD-10-CM

## 2022-04-14 NOTE — Assessment & Plan Note (Signed)
Chronic, stable.  Recommend she only take Trazodone 1/2 a pill in evening as needed and continue to cut back.  Do not take unless needed.  She wishes to continue minimal use.  Discussed mouth appliances for sleep aides.

## 2022-04-14 NOTE — Assessment & Plan Note (Signed)
Noted on past labs, recommend taking Vitamin D3 2000 units daily, check level today.

## 2022-04-14 NOTE — Progress Notes (Signed)
BP 134/84 (BP Location: Left Arm, Patient Position: Sitting, Cuff Size: Large)   Pulse 60   Temp 97.9 F (36.6 C) (Oral)   Ht '5\' 6"'$  (1.676 m)   Wt 254 lb 8 oz (115.4 kg)   SpO2 94%   BMI 41.08 kg/m    Subjective:    Patient ID: Erika Anderson, female    DOB: 1950/11/24, 71 y.o.   MRN: [id number]  HPI: Erika Anderson is a 71 y.o. female  Chief Complaint  Patient presents with   Hyperlipidemia   Hypertension   Sleep Apnea   RLS   Leukemia    Patient says she was recently diagnosed with Leukemia (CLL) and says she will start treatment tomorrow of Brukinsa 80 mg.    Groin Pain    Patient says has been having pain in her R groin area and was told by cancer center it may be from swollen lymph nodes.    ATRIAL FIBRILLATION Currently on Metoprolol and Eliquis.  Last visit with cardiology was on 11/25/21 -- she returns to see them on Monday.  Ablation performed on 03/22/22.  Saw pulmonary on 02/26/22 --  no sleep apnea on testing, she stopped Trazodone -- only takes minimally.  They recommended using a sleep aid.  They are going to hold off on repeat sleep study.   Atrial fibrillation status: stable Satisfied with current treatment: yes  Medication side effects:  no Medication compliance: good compliance Etiology of atrial fibrillation: unknown Palpitations:  yes Chest pain: no Dyspnea on exertion: occasional and fatigues easily at times Orthopnea: no Syncope:  no Edema:  yes Ventricular rate control: B-blocker Anti-coagulation: long acting   HYPERLIPIDEMIA Continues on Rosuvastatin. Satisfied with current treatment? yes Duration of hyperlipidemia: chronic Cholesterol medication side effects: no Cholesterol supplements: none Medication compliance: good compliance Aspirin: no Recent stressors: no Recurrent headaches: no Visual changes: no Palpitations: no Dyspnea: occasional and fatigues easily at times Chest pain: no Lower extremity edema:  yes Dizzy/lightheaded: no   RESTLESS LEGS Continues on Requip 1 MG QID (on max dose).  Taking Gabapentin 300 MG at night for back pain PRN. Duration: chronic Discomfort description:  ill-defined Pain: no Location: lower legs Bilateral: yes Symmetric: yes Severity: mild Onset:  gradual Frequency:  intermittent Symptoms only occur while legs at rest: yes Sudden unintentional leg jerking: yes Bed partner bothered by leg movements: no LE numbness: no Decreased sensation: no Weakness: no Insomnia: yes Daytime somnolence: no Fatigue: no Alleviating factors: Requip Aggravating factors: rest Status: stable Treatments attempted:  as above  CLL: Diagnosed on 04/01/22 with elevations noted on CBC.  Is followed by Dr. Tasia Catchings.  They plan to start treatments tomorrow -- was told this will be provided via grant -- no cost to her.  Last oncology visit was 04/01/22.  She reports night sweats ongoing, no recent fevers.  She is having some groin pain today, which she was told could be from lymph nodes being inflamed -- also had recent surgery with groin area involved.       04/14/2022    9:25 AM 03/02/2022    8:41 AM 01/27/2022    8:42 AM 12/07/2021    9:18 AM 07/30/2021    9:10 AM  Depression screen PHQ 2/9  Decreased Interest 0 0 0 0 0  Down, Depressed, Hopeless 0 0 0 0 0  PHQ - 2 Score 0 0 0 0 0  Altered sleeping '1 2 3 3 1  '$ Tired, decreased energy 3 2  $'2 2 1  'u$ Change in appetite 0 0 1  1  Feeling bad or failure about yourself  0 0 0 0 0  Trouble concentrating 0 0 0 0 0  Moving slowly or fidgety/restless 0 0 1 0 0  Suicidal thoughts 0 0 0 0 0  PHQ-9 Score '4 4 7 5 3  '$ Difficult doing work/chores Somewhat difficult Not difficult at all Not difficult at all Somewhat difficult Not difficult at all       04/14/2022    9:25 AM 01/27/2022    8:42 AM 12/07/2021    9:19 AM 01/19/2019   10:00 AM  GAD 7 : Generalized Anxiety Score  Nervous, Anxious, on Edge 0 0 0 0  Control/stop worrying 0 0 0 0   Worry too much - different things 0 0 1 0  Trouble relaxing 2 0 1 0  Restless 2 0 1 0  Easily annoyed or irritable 0 0 0 0  Afraid - awful might happen 0 0 0 0  Total GAD 7 Score 4 0 3 0  Anxiety Difficulty Not difficult at all Not difficult at all Not difficult at all     Relevant past medical, surgical, family and social history reviewed and updated as indicated. Interim medical history since our last visit reviewed. Allergies and medications reviewed and updated.  Review of Systems  Constitutional:  Negative for activity change, appetite change, diaphoresis, fatigue and fever.  Respiratory:  Positive for shortness of breath (with activity). Negative for cough, chest tightness and wheezing.   Cardiovascular:  Negative for chest pain, palpitations and leg swelling.  Gastrointestinal: Negative.   Endocrine: Negative.   Musculoskeletal:  Positive for arthralgias.  Neurological: Negative.   Psychiatric/Behavioral: Negative.      Per HPI unless specifically indicated above     Objective:    BP 134/84 (BP Location: Left Arm, Patient Position: Sitting, Cuff Size: Large)   Pulse 60   Temp 97.9 F (36.6 C) (Oral)   Ht '5\' 6"'$  (1.676 m)   Wt 254 lb 8 oz (115.4 kg)   SpO2 94%   BMI 41.08 kg/m   Wt Readings from Last 3 Encounters:  04/14/22 254 lb 8 oz (115.4 kg)  04/01/22 250 lb 9.6 oz (113.7 kg)  03/22/22 248 lb (112.5 kg)    Physical Exam Vitals and nursing note reviewed.  Constitutional:      General: She is awake. She is not in acute distress.    Appearance: She is well-developed and well-groomed. She is obese. She is not ill-appearing or toxic-appearing.  HENT:     Head: Normocephalic.     Right Ear: Hearing normal.     Left Ear: Hearing normal.  Eyes:     General: Lids are normal.        Right eye: No discharge.        Left eye: No discharge.     Conjunctiva/sclera: Conjunctivae normal.     Pupils: Pupils are equal, round, and reactive to light.  Neck:      Thyroid: No thyromegaly.     Vascular: No carotid bruit.  Cardiovascular:     Rate and Rhythm: Normal rate and regular rhythm.     Heart sounds: Normal heart sounds. No murmur heard.    No gallop. No S3 or S4 sounds.  Pulmonary:     Effort: Pulmonary effort is normal. No accessory muscle usage or respiratory distress.     Breath sounds: Normal breath sounds.  Abdominal:  General: Bowel sounds are normal. There is no distension.     Palpations: Abdomen is soft.     Tenderness: There is no abdominal tenderness.  Musculoskeletal:     Cervical back: Normal range of motion and neck supple.     Right lower leg: Edema (trace) present.     Left lower leg: Edema (trace) present.  Lymphadenopathy:     Cervical: No cervical adenopathy.  Skin:    General: Skin is warm and dry.  Neurological:     Mental Status: She is alert and oriented to person, place, and time.  Psychiatric:        Attention and Perception: Attention normal.        Mood and Affect: Mood normal.        Speech: Speech normal.        Behavior: Behavior normal. Behavior is cooperative.        Thought Content: Thought content normal.    Results for orders placed or performed during the hospital encounter of 03/22/22  POCT Activated clotting time  Result Value Ref Range   Activated Clotting Time 359 seconds      Assessment & Plan:   Problem List Items Addressed This Visit       Cardiovascular and Mediastinum   Aortic atherosclerosis ([place]) (Chronic)    Ongoing.  Noted on imaging 05/03/18.  Continue statin for prevention and monitor closely.      Relevant Orders   Comprehensive metabolic panel   Lipid Panel w/o Chol/HDL Ratio   Atrial fibrillation (HCC) (Chronic)    Ongoing, stable.  Ablation on 03/22/22.  She continues to be symptomatic with mild SOB reported and edema.  Continue current medication regimen and adjust as needed.  Continue to collaborate with cardiology, recent notes reviewed.  All questions  answered.  Labs today.        Relevant Orders   Comprehensive metabolic panel     Other   BMI 40.0-44.9, adult (HCC) (Chronic)    Recommended eating smaller high protein, low fat meals more frequently and exercising 30 mins a day 5 times a week with a goal of 10-15lb weight loss in the next 3 months. Patient voiced their understanding and motivation to adhere to these recommendations.       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  Continue collaboration with oncology, recent note reviewed.  Recommend if worsening groin pain to alert provider or oncology.  Could obtain imaging.      Relevant Orders   Comprehensive metabolic panel   Hyperlipidemia (Chronic)    Chronic, ongoing.  Continue current medication regimen and adjust as needed.  Lipid panel today.      Relevant Orders   Comprehensive metabolic panel   Lipid Panel w/o Chol/HDL Ratio   Insomnia (Chronic)    Chronic, stable.  Recommend she only take Trazodone 1/2 a pill in evening as needed and continue to cut back.  Do not take unless needed.  She wishes to continue minimal use.  Discussed mouth appliances for sleep aides.      Morbid obesity (HCC) (Chronic)    BMI 41.08.  Recommended eating smaller high protein, low fat meals more frequently and exercising 30 mins a day 5 times a week with a goal of 10-15lb weight loss in the next 3 months. Patient voiced their understanding and motivation to adhere to these recommendations.       RLS (restless legs syndrome) (Chronic)  Chronic and stable with Requip and Gabapentin, continue this regimen and adjust as needed.  Monitor kidney function closely with medications.  Labs today: CMP.      Relevant Orders   Comprehensive metabolic panel   Vitamin D deficiency (Chronic)    Noted on past labs, recommend taking Vitamin D3 2000 units daily, check level today.      Relevant Orders   VITAMIN D 25 Hydroxy (Vit-D Deficiency,  Fractures)   Other Visit Diagnoses     Flu vaccine need       Flu vaccine in office today   Relevant Orders   Flu Vaccine QUAD High Dose(Fluad)        Follow up plan: Return in about 3 months (around 07/15/2022) for CLL, HLD, A-FIB, HTN, RLS.

## 2022-04-14 NOTE — Assessment & Plan Note (Addendum)
New diagnosis on 04/01/22.  Continue collaboration with oncology, recent note reviewed.  Recommend if worsening groin pain to alert provider or oncology.  Could obtain imaging.

## 2022-04-14 NOTE — Assessment & Plan Note (Signed)
Ongoing.  Noted on imaging 05/03/18.  Continue statin for prevention and monitor closely.

## 2022-04-14 NOTE — Assessment & Plan Note (Signed)
BMI 41.08.  Recommended eating smaller high protein, low fat meals more frequently and exercising 30 mins a day 5 times a week with a goal of 10-15lb weight loss in the next 3 months. Patient voiced their understanding and motivation to adhere to these recommendations.

## 2022-04-14 NOTE — Assessment & Plan Note (Signed)
Recommended eating smaller high protein, low fat meals more frequently and exercising 30 mins a day 5 times a week with a goal of 10-15lb weight loss in the next 3 months. Patient voiced their understanding and motivation to adhere to these recommendations.  

## 2022-04-14 NOTE — Assessment & Plan Note (Signed)
Chronic and stable with Requip and Gabapentin, continue this regimen and adjust as needed.  Monitor kidney function closely with medications.  Labs today: CMP.

## 2022-04-14 NOTE — Assessment & Plan Note (Signed)
Ongoing, stable.  Ablation on 03/22/22.  She continues to be symptomatic with mild SOB reported and edema.  Continue current medication regimen and adjust as needed.  Continue to collaborate with cardiology, recent notes reviewed.  All questions answered.  Labs today.

## 2022-04-14 NOTE — Assessment & Plan Note (Signed)
Chronic, ongoing.  Continue current medication regimen and adjust as needed. Lipid panel today. 

## 2022-04-15 LAB — LIPID PANEL W/O CHOL/HDL RATIO
Cholesterol, Total: 156 mg/dL (ref 100–199)
HDL: 56 mg/dL (ref >39)
LDL Chol Calc (NIH): 80 mg/dL (ref 0–99)
Triglycerides: 113 mg/dL (ref 0–149)
VLDL Cholesterol Cal: 20 mg/dL (ref 5–40)

## 2022-04-15 LAB — COMPREHENSIVE METABOLIC PANEL
ALT: 40 IU/L — ABNORMAL HIGH (ref 0–32)
AST: 36 IU/L (ref 0–40)
Albumin/Globulin Ratio: 1.8 (ref 1.2–2.2)
Albumin: 4.4 g/dL (ref 3.9–4.9)
Alkaline Phosphatase: 93 IU/L (ref 44–121)
BUN/Creatinine Ratio: 17 (ref 12–28)
BUN: 15 mg/dL (ref 8–27)
Bilirubin Total: 0.4 mg/dL (ref 0.0–1.2)
CO2: 25 mmol/L (ref 20–29)
Calcium: 9.9 mg/dL (ref 8.7–10.3)
Chloride: 100 mmol/L (ref 96–106)
Creatinine, Ser: 0.87 mg/dL (ref 0.57–1.00)
Globulin, Total: 2.5 g/dL (ref 1.5–4.5)
Glucose: 86 mg/dL (ref 70–99)
Potassium: 4 mmol/L (ref 3.5–5.2)
Sodium: 142 mmol/L (ref 134–144)
Total Protein: 6.9 g/dL (ref 6.0–8.5)
eGFR: 72 mL/min/{1.73_m2} (ref >59)

## 2022-04-15 LAB — VITAMIN D 25 HYDROXY (VIT D DEFICIENCY, FRACTURES): Vit D, 25-Hydroxy: 51.1 ng/mL (ref 30.0–100.0)

## 2022-04-15 NOTE — Progress Notes (Signed)
Contacted via [place] afternoon Rosaelena, your labs have returned and overall remain stable.  Kidney function, creatinine and eGFR, remains normal. Liver function shows normal AST and ALT mild elevation, we will monitor this closely. No medication changes needed.  Any questions? Keep being stellar!!  Thank you for allowing me to participate in your care.  I appreciate you. Kindest regards, Tavius Turgeon

## 2022-04-16 ENCOUNTER — Other Ambulatory Visit (HOSPITAL_COMMUNITY): Payer: Self-pay

## 2022-04-19 ENCOUNTER — Encounter (HOSPITAL_COMMUNITY): Payer: Self-pay | Admitting: Physician Assistant

## 2022-04-19 ENCOUNTER — Ambulatory Visit (HOSPITAL_COMMUNITY)
Admission: RE | Admit: 2022-04-19 | Discharge: 2022-04-19 | Disposition: A | Discharge status: Home / Self Care | Payer: Medicare HMO | Source: Ambulatory Visit | Attending: Physician Assistant | Admitting: Physician Assistant

## 2022-04-19 VITALS — BP 138/88 | HR 67 | Ht 66.0 in | Wt 255.4 lb

## 2022-04-19 DIAGNOSIS — E669 Obesity, unspecified: Secondary | ICD-10-CM | POA: Insufficient documentation

## 2022-04-19 DIAGNOSIS — D6869 Other thrombophilia: Secondary | ICD-10-CM | POA: Insufficient documentation

## 2022-04-19 DIAGNOSIS — I4819 Other persistent atrial fibrillation: Secondary | ICD-10-CM | POA: Diagnosis not present

## 2022-04-19 DIAGNOSIS — I480 Paroxysmal atrial fibrillation: Secondary | ICD-10-CM

## 2022-04-19 DIAGNOSIS — E785 Hyperlipidemia, unspecified: Secondary | ICD-10-CM | POA: Diagnosis not present

## 2022-04-19 DIAGNOSIS — Z6841 Body Mass Index (BMI) 40.0 and over, adult: Secondary | ICD-10-CM | POA: Insufficient documentation

## 2022-04-19 DIAGNOSIS — I4892 Unspecified atrial flutter: Secondary | ICD-10-CM | POA: Diagnosis not present

## 2022-04-19 DIAGNOSIS — C9110 Chronic lymphocytic leukemia of B-cell type not having achieved remission: Secondary | ICD-10-CM | POA: Insufficient documentation

## 2022-04-19 NOTE — Progress Notes (Signed)
Primary Care Physician: Venita Lick, NP Primary Cardiologist: Dr Garen Lah Primary Electrophysiologist: Dr Quentin Ore Referring Physician: Dr Polly Cobia is a 71 y.o. female with a history of HLD, CLL, atrial flutter, atrial fibrillation who presents for follow up in the [facility]. Patient is on Eliquis for a CHADS2VASC score of 2. She wore a Zio monitor which showed 26% burden of afib. She underwent afib and flutter ablation with Dr Quentin Ore on 03/22/22.   On follow up today, she reports that she has done well since the ablation. She does not believe she has had any afib. She denies chest pain, swallowing pain, or groin issues. She has been recently diagnosed with CLL and is starting treatment. She has noted some increased swelling in her legs, followed at [facility] for lymphedema.   Today, she denies symptoms of palpitations, chest pain, shortness of breath, orthopnea, PND, dizziness, presyncope, syncope, snoring, daytime somnolence, bleeding, or neurologic sequela. The patient is tolerating medications without difficulties and is otherwise without complaint today.    Atrial Fibrillation Risk Factors:  she does not have symptoms or diagnosis of sleep apnea. Negative sleep study 2023 she does not have a history of rheumatic fever.   she has a BMI of Body mass index is 41.22 kg/m.Marland Kitchen Filed Weights   04/19/22 1115  Weight: 115.8 kg    Family History  Problem Relation Age of Onset   Cancer Mother        ovarian   Dementia Father    Arthritis Sister    Heart disease Brother        MI   Stroke Paternal Grandmother    Cancer Paternal Grandfather        lung   Breast cancer Neg Hx      Atrial Fibrillation Management history:  Previous antiarrhythmic drugs: none Previous cardioversions: none Previous ablations: 03/22/22 CHADS2VASC score: 2 Anticoagulation history: Eliquis   Past Medical History:   Diagnosis Date   Atrial fibrillation ([place])    Hyperlipidemia    Leukemia ([place])    Obesity (BMI 30-39.9)    Restless legs syndrome (RLS) 12/28/2014   RLS (restless legs syndrome)    Past Surgical History:  Procedure Laterality Date   ATRIAL FIBRILLATION ABLATION N/A 03/22/2022   Procedure: ATRIAL FIBRILLATION ABLATION;  Surgeon: Vickie Epley, MD;  Location: [facility];  Service: Cardiovascular;  Laterality: N/A;   COLONOSCOPY WITH PROPOFOL N/A 08/24/2021   Procedure: COLONOSCOPY WITH PROPOFOL;  Surgeon: Jonathon Bellows, MD;  Location: [facility] ENDOSCOPY;  Service: Gastroenterology;  Laterality: N/A;   HEMORROIDECTOMY     TOTAL ABDOMINAL HYSTERECTOMY     ovaries remain    Current Outpatient Medications  Medication Sig Dispense Refill   citalopram (CELEXA) 20 MG tablet Take 1 tablet (20 mg total) by mouth daily. 90 tablet 4   ELIQUIS 5 MG TABS tablet Take 1 tablet by mouth twice daily 180 tablet 1   gabapentin (NEURONTIN) 300 MG capsule Take 2 capsules (600 mg total) by mouth at bedtime. (Patient taking differently: Take 300 mg by mouth at bedtime as needed (pain).) 180 capsule 4   metoprolol tartrate (LOPRESSOR) 50 MG tablet Take 1 tablet (50 mg total) by mouth 2 (two) times daily. (Patient taking differently: Take 25 mg by mouth 2 (two) times daily.) 60 tablet 3   Multiple Vitamins-Minerals (MULTIVITAMIN WITH MINERALS) tablet Take 1 tablet by mouth daily.     pantoprazole (PROTONIX) 40  MG tablet Take 1 tablet (40 mg total) by mouth daily. 45 tablet 0   rOPINIRole (REQUIP) 1 MG tablet Take 1 tablet (1 mg total) by mouth 4 (four) times daily. 360 tablet 4   rosuvastatin (CRESTOR) 10 MG tablet Take 1 tablet (10 mg total) by mouth daily. 90 tablet 3   zanubrutinib (BRUKINSA) 80 MG capsule Take 2 capsules (160 mg total) by mouth 2 (two) times daily. 120 capsule 1   No current facility-administered medications for this encounter.    No Known Allergies  Social History    Socioeconomic History   Marital status: Married    Spouse name: Not on file   Number of children: Not on file   Years of education: 12   Highest education level: High school graduate  Occupational History   Occupation: retired  Tobacco Use   Smoking status: Never   Smokeless tobacco: Never   Tobacco comments:    Never smoke 04/19/22  Vaping Use   Vaping Use: Never used  Substance and Sexual Activity   Alcohol use: No    Alcohol/week: 0.0 standard drinks of alcohol   Drug use: No   Sexual activity: Not Currently  Other Topics Concern   Not on file  Social History Narrative   Helps take care of grandchildren    Social Determinants of Health   Financial Resource Strain: Low Risk  (03/02/2022)   Overall Financial Resource Strain (CARDIA)    Difficulty of Paying Living Expenses: Not hard at all  Food Insecurity: No Food Insecurity (03/02/2022)   Hunger Vital Sign    Worried About Running Out of Food in the Last Year: Never true    [place] in the Last Year: Never true  Transportation Needs: No Transportation Needs (02/27/2021)   PRAPARE - Hydrologist (Medical): No    Lack of Transportation (Non-Medical): No  Physical Activity: Inactive (03/02/2022)   Exercise Vital Sign    Days of Exercise per Week: 0 days    Minutes of Exercise per Session: 0 min  Stress: No Stress Concern Present (03/02/2022)   [place]    Feeling of Stress : Not at all  Social Connections: [place] (03/02/2022)   Social Connection and Isolation Panel [NHANES]    Frequency of Communication with Friends and Family: More than three times a week    Frequency of Social Gatherings with Friends and Family: Twice a week    Attends Religious Services: More than 4 times per year    Active Member of [employer] or Organizations: Yes    Attends Music therapist: More than 4 times per year     Marital Status: Married  Human resources officer Violence: Not At Risk (03/02/2022)   Humiliation, Afraid, Rape, and Kick questionnaire    Fear of Current or Ex-Partner: No    Emotionally Abused: No    Physically Abused: No    Sexually Abused: No     ROS- All systems are reviewed and negative except as per the HPI above.  Physical Exam: Vitals:   04/19/22 1115  BP: 138/88  Pulse: 67  Weight: 115.8 kg  Height: '5\' 6"'$  (1.676 m)    GEN- The patient is a well appearing obese female, alert and oriented x 3 today.   Head- normocephalic, atraumatic Eyes-  Sclera clear, conjunctiva pink Ears- hearing intact Oropharynx- clear Neck- supple  Lungs- Clear to ausculation bilaterally, normal work  of breathing Heart- Regular rate and rhythm, no murmurs, rubs or gallops  GI- soft, NT, ND, + BS Extremities- no clubbing, cyanosis, bilateral non pitting edema MS- no significant deformity or atrophy Skin- no rash or lesion Psych- euthymic mood, full affect Neuro- strength and sensation are intact  Wt Readings from Last 3 Encounters:  04/19/22 115.8 kg  04/14/22 115.4 kg  04/01/22 113.7 kg    EKG today demonstrates  SR Vent. rate 67 BPM PR interval 146 ms QRS duration 78 ms QT/QTcB 424/448 ms  Echo 10/15/21 demonstrated   1. Left ventricular ejection fraction, by estimation, is 45 to 50%. The  left ventricle has mildly decreased function. The left ventricle has no  regional wall motion abnormalities. Left ventricular diastolic parameters  are consistent with Grade II diastolic dysfunction (pseudonormalization). The average left ventricular global longitudinal strain is -13.3 %. The global longitudinal strain is abnormal.   2. Right ventricular systolic function is low normal. The right  ventricular size is mildly enlarged. There is mildly elevated pulmonary  artery systolic pressure. The estimated right ventricular systolic  pressure is 43.1 mmHg.   3. Left atrial size was mildly dilated.    4. Right atrial size was mildly dilated.   5. The mitral valve is normal in structure. Mild mitral valve  regurgitation. No evidence of mitral stenosis.   6. The aortic valve was not well visualized. Aortic valve regurgitation  is not visualized. No aortic stenosis is present.   7. The inferior vena cava is dilated in size with >50% respiratory  variability, suggesting right atrial pressure of 8 mmHg.   Epic records are reviewed at length today  CHA2DS2-VASc Score = 2  The patient's score is based upon: CHF History: 0 HTN History: 0 Diabetes History: 0 Stroke History: 0 Vascular Disease History: 0 Age Score: 1 Gender Score: 1       ASSESSMENT AND PLAN: 1. Paroxysmal Atrial Fibrillation/atrial flutter The patient's CHA2DS2-VASc score is 2, indicating a 2.2% annual risk of stroke.   S/p afib and flutter ablation 03/22/22 Patient appears to be maintaining SR. Continue Eliquis 5 mg BID with no missed doses for 3 months post ablation.  Continue Lopressor 50 mg BID  2. Obesity Body mass index is 41.22 kg/m. Lifestyle modification was discussed at length including regular exercise and weight reduction.  3. CLL Plans per Dr Tasia Catchings   Follow up with Dr Garen Lah and Dr Quentin Ore as scheduled.    [facility] [address] [place], [place] [address] [phone] 04/19/2022 11:53 AM

## 2022-05-03 ENCOUNTER — Other Ambulatory Visit: Payer: Self-pay

## 2022-05-03 ENCOUNTER — Encounter: Payer: Self-pay | Admitting: Oncology

## 2022-05-03 ENCOUNTER — Inpatient Hospital Stay: Payer: Medicare HMO | Admitting: Pharmacist

## 2022-05-03 ENCOUNTER — Inpatient Hospital Stay: Payer: Medicare HMO | Admitting: Oncology

## 2022-05-03 ENCOUNTER — Inpatient Hospital Stay: Payer: Medicare HMO | Attending: Oncology

## 2022-05-03 VITALS — BP 135/69 | HR 63 | Temp 96.8°F | Resp 18 | Wt 254.6 lb

## 2022-05-03 DIAGNOSIS — Z79899 Other long term (current) drug therapy: Secondary | ICD-10-CM | POA: Insufficient documentation

## 2022-05-03 DIAGNOSIS — E785 Hyperlipidemia, unspecified: Secondary | ICD-10-CM | POA: Insufficient documentation

## 2022-05-03 DIAGNOSIS — Z7189 Other specified counseling: Secondary | ICD-10-CM

## 2022-05-03 DIAGNOSIS — C9110 Chronic lymphocytic leukemia of B-cell type not having achieved remission: Secondary | ICD-10-CM

## 2022-05-03 DIAGNOSIS — Z801 Family history of malignant neoplasm of trachea, bronchus and lung: Secondary | ICD-10-CM | POA: Diagnosis not present

## 2022-05-03 DIAGNOSIS — Z1509 Genetic susceptibility to other malignant neoplasm: Secondary | ICD-10-CM | POA: Diagnosis not present

## 2022-05-03 DIAGNOSIS — I4891 Unspecified atrial fibrillation: Secondary | ICD-10-CM | POA: Insufficient documentation

## 2022-05-03 DIAGNOSIS — Z823 Family history of stroke: Secondary | ICD-10-CM | POA: Insufficient documentation

## 2022-05-03 DIAGNOSIS — R21 Rash and other nonspecific skin eruption: Secondary | ICD-10-CM | POA: Insufficient documentation

## 2022-05-03 DIAGNOSIS — Z1501 Genetic susceptibility to malignant neoplasm of breast: Secondary | ICD-10-CM | POA: Diagnosis not present

## 2022-05-03 DIAGNOSIS — G479 Sleep disorder, unspecified: Secondary | ICD-10-CM | POA: Diagnosis not present

## 2022-05-03 DIAGNOSIS — D729 Disorder of white blood cells, unspecified: Secondary | ICD-10-CM

## 2022-05-03 DIAGNOSIS — Z8249 Family history of ischemic heart disease and other diseases of the circulatory system: Secondary | ICD-10-CM | POA: Diagnosis not present

## 2022-05-03 DIAGNOSIS — Z1502 Genetic susceptibility to malignant neoplasm of ovary: Secondary | ICD-10-CM | POA: Insufficient documentation

## 2022-05-03 DIAGNOSIS — Z8261 Family history of arthritis: Secondary | ICD-10-CM | POA: Insufficient documentation

## 2022-05-03 DIAGNOSIS — Z818 Family history of other mental and behavioral disorders: Secondary | ICD-10-CM | POA: Insufficient documentation

## 2022-05-03 DIAGNOSIS — Z8041 Family history of malignant neoplasm of ovary: Secondary | ICD-10-CM | POA: Insufficient documentation

## 2022-05-03 DIAGNOSIS — G2581 Restless legs syndrome: Secondary | ICD-10-CM | POA: Diagnosis not present

## 2022-05-03 DIAGNOSIS — Z5111 Encounter for antineoplastic chemotherapy: Secondary | ICD-10-CM

## 2022-05-03 DIAGNOSIS — Z7901 Long term (current) use of anticoagulants: Secondary | ICD-10-CM | POA: Insufficient documentation

## 2022-05-03 LAB — CBC WITH DIFFERENTIAL/PLATELET
Abs Immature Granulocytes: 0.04 10*3/uL (ref 0.00–0.07)
Basophils Absolute: 0.1 10*3/uL (ref 0.0–0.1)
Basophils Relative: 0 %
Eosinophils Absolute: 0.1 10*3/uL (ref 0.0–0.5)
Eosinophils Relative: 0 %
HCT: 41.2 % (ref 36.0–46.0)
Hemoglobin: 13.5 g/dL (ref 12.0–15.0)
Immature Granulocytes: 0 %
Lymphocytes Relative: 80 %
Lymphs Abs: 20.6 10*3/uL — ABNORMAL HIGH (ref 0.7–4.0)
MCH: 30.6 pg (ref 26.0–34.0)
MCHC: 32.8 g/dL (ref 30.0–36.0)
MCV: 93.4 fL (ref 80.0–100.0)
Monocytes Absolute: 0.7 10*3/uL (ref 0.1–1.0)
Monocytes Relative: 3 %
Neutro Abs: 4.3 10*3/uL (ref 1.7–7.7)
Neutrophils Relative %: 17 %
Platelets: 187 10*3/uL (ref 150–400)
RBC: 4.41 MIL/uL (ref 3.87–5.11)
RDW: 13 % (ref 11.5–15.5)
Smear Review: NORMAL
WBC: 25.9 10*3/uL — ABNORMAL HIGH (ref 4.0–10.5)
nRBC: 0.1 % (ref 0.0–0.2)

## 2022-05-03 LAB — COMPREHENSIVE METABOLIC PANEL
ALT: 28 U/L (ref 0–44)
AST: 27 U/L (ref 15–41)
Albumin: 4.2 g/dL (ref 3.5–5.0)
Alkaline Phosphatase: 85 U/L (ref 38–126)
Anion gap: 5 (ref 5–15)
BUN: 17 mg/dL (ref 8–23)
CO2: 28 mmol/L (ref 22–32)
Calcium: 9 mg/dL (ref 8.9–10.3)
Chloride: 100 mmol/L (ref 98–111)
Creatinine, Ser: 0.71 mg/dL (ref 0.44–1.00)
GFR, Estimated: >60 mL/min (ref >60)
Glucose, Bld: 103 mg/dL — ABNORMAL HIGH (ref 70–99)
Potassium: 4.5 mmol/L (ref 3.5–5.1)
Sodium: 133 mmol/L — ABNORMAL LOW (ref 135–145)
Total Bilirubin: 0.5 mg/dL (ref 0.3–1.2)
Total Protein: 7.7 g/dL (ref 6.5–8.1)

## 2022-05-03 NOTE — Assessment & Plan Note (Signed)
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 Rapid lymphocyte increment, 50% over 2 months period Labs are reviewed and discussed with patient. Continue Zanubrutinib 134m BID She is on Elqiuis, will watch for possible increase risk of bleeding.   Leukocytosis is worse, as expected due to medication side effects. Anticipate to improve in a few weeks.

## 2022-05-03 NOTE — Progress Notes (Signed)
Hematology/Oncology Consult Note [phone] [phone]     Patient Care Team: Venita Lick, NP as PCP - General (Nurse Practitioner) Vickie Epley, MD as PCP - Electrophysiology (Cardiology) Rico Junker, RN as Registered Nurse Theodore Demark, RN (Inactive) as Registered Nurse  ASSESSMENT & PLAN  Cancer Staging  CLL (chronic lymphocytic leukemia) [facility]) Staging form: Chronic Lymphocytic Leukemia / Small Lymphocytic Lymphoma, AJCC 8th Edition - Clinical: Modified Rai Stage 0 (Modified Rai risk: Low, Lymphocytosis: Present, Adenopathy: Absent, Organomegaly: Absent, Anemia: Absent, Thrombocytopenia: Absent) - Signed by Earlie Server, MD on 04/01/2022   CLL (chronic lymphocytic leukemia) ([place]) CLL +TP53 mutation, + constitutional symptoms [night sweat daily]. Rapid lymphocyte increment, 50% over 2 months period Labs are reviewed and discussed with patient. Continue Zanubrutinib 156m BID She is on Elqiuis, will watch for possible increase risk of bleeding.   Leukocytosis is worse, as expected due to medication side effects. Anticipate to improve in a few weeks.   Encounter for antineoplastic chemotherapy Continue Zanubrutinib 1663mBID  Skin rash Possible medication related.  Currently symptom is mild. Observe.     Follow up to be determined.     ZhEarlie ServerMD, PhD [facility] H[facility] Oncology 05/03/2022       CHIEF COMPLAINTS/PURPOSE OF CONSULTATION:  CLL  HISTORY OF PRESENTING ILLNESS:  TeFaustino Congress63.o. female presents to follow up with CLL.  She was found to have abnormal CBC on 03/01/22, total white count of 15, predominantly lymphocytosis. This is chronic since Feb 2023. Peripheral blood smear + smudge cells.  + Restless leg syndrome, sleep disturbance for 1 year.  + Family history of ovarian cancer -mother.    Oncology History  [place] Initial Diagnosis   CLL (chronic  lymphocytic leukemia)  -03/15/22 Flowcytometry showed chronic lymphocytic leukemia, B cell, CD38 expression indeterminate for prognosis (23%) and CD20 positive.  45% nuclei positive for TP53 gene deletion.    03/26/2022 Imaging   USKoreabdomen 1. No sonographic etiology for night sweats identified. 2. Spleen is at the upper limits of normal in size    04/01/2022 Cancer Staging   Staging form: Chronic Lymphocytic Leukemia / Small Lymphocytic Lymphoma, AJCC 8th Edition - Clinical: Modified Rai Stage 0 (Modified Rai risk: Low, Lymphocytosis: Present, Adenopathy: Absent, Organomegaly: Absent, Anemia: Absent, Thrombocytopenia: Absent) - Signed by YuEarlie ServerMD on 04/01/2022 Stage prefix: Initial diagnosis Absolute lymphocyte count (ALC) (cells/uL): 18   04/19/2022 -  Chemotherapy   Started on Zanubrutinib 16041mID      She reports tolerating Zanubrutinib 160m33mD well. Night sweat intermittently, seems to be less often.  She has noticed small red spots on her skin, multiple area, for a few days.  No other new complaints.    MEDICAL HISTORY:  Past Medical History:  Diagnosis Date   Atrial fibrillation (HCC)    Hyperlipidemia    Leukemia (HCC)[place] Obesity (BMI 30-39.9)    Restless legs syndrome (RLS) 12/28/2014   RLS (restless legs syndrome)     SURGICAL HISTORY: Past Surgical History:  Procedure Laterality Date   ATRIAL FIBRILLATION ABLATION N/A 03/22/2022   Procedure: ATRIAL FIBRILLATION ABLATION;  Surgeon: LambVickie Epley;  [facility];  Service: Cardiovascular;  Laterality: N/A;   COLONOSCOPY WITH PROPOFOL N/A 08/24/2021   Procedure: COLONOSCOPY WITH PROPOFOL;  Surgeon: AnnaJonathon Bellows;  [facility];  Service: Gastroenterology;  Laterality: N/A;   HEMORROIDECTOMY  TOTAL ABDOMINAL HYSTERECTOMY     ovaries remain    SOCIAL HISTORY: Social History   Socioeconomic History   Marital status: Married    Spouse name: Not on file   Number  of children: Not on file   Years of education: 12   Highest education level: High school graduate  Occupational History   Occupation: retired  Tobacco Use   Smoking status: Never   Smokeless tobacco: Never   Tobacco comments:    Never smoke 04/19/22  Vaping Use   Vaping Use: Never used  Substance and Sexual Activity   Alcohol use: No    Alcohol/week: 0.0 standard drinks of alcohol   Drug use: No   Sexual activity: Not Currently  Other Topics Concern   Not on file  Social History Narrative   Helps take care of grandchildren    Social Determinants of Health   Financial Resource Strain: Low Risk  (03/02/2022)   Overall Financial Resource Strain (CARDIA)    Difficulty of Paying Living Expenses: Not hard at all  Food Insecurity: No Food Insecurity (03/02/2022)   Hunger Vital Sign    Worried About Running Out of Food in the Last Year: Never true    [place] in the Last Year: Never true  Transportation Needs: No Transportation Needs (02/27/2021)   PRAPARE - Hydrologist (Medical): No    Lack of Transportation (Non-Medical): No  Physical Activity: Inactive (03/02/2022)   Exercise Vital Sign    Days of Exercise per Week: 0 days    Minutes of Exercise per Session: 0 min  Stress: No Stress Concern Present (03/02/2022)   [place]    Feeling of Stress : Not at all  Social Connections: [place] (03/02/2022)   Social Connection and Isolation Panel [NHANES]    Frequency of Communication with Friends and Family: More than three times a week    Frequency of Social Gatherings with Friends and Family: Twice a week    Attends Religious Services: More than 4 times per year    Active Member of [employer] or Organizations: Yes    Attends Music therapist: More than 4 times per year    Marital Status: Married  Human resources officer Violence: Not At Risk (03/02/2022)   Humiliation,  Afraid, Rape, and Kick questionnaire    Fear of Current or Ex-Partner: No    Emotionally Abused: No    Physically Abused: No    Sexually Abused: No    FAMILY HISTORY: Family History  Problem Relation Age of Onset   Cancer Mother        ovarian   Dementia Father    Arthritis Sister    Heart disease Brother        MI   Stroke Paternal Grandmother    Cancer Paternal Grandfather        lung   Breast cancer Neg Hx     ALLERGIES:  has No Known Allergies.  MEDICATIONS:  Current Outpatient Medications  Medication Sig Dispense Refill   citalopram (CELEXA) 20 MG tablet Take 1 tablet (20 mg total) by mouth daily. 90 tablet 4   colchicine 0.6 MG tablet Take 0.6 mg by mouth 2 (two) times daily.     ELIQUIS 5 MG TABS tablet Take 1 tablet by mouth twice daily 180 tablet 1   gabapentin (NEURONTIN) 300 MG capsule Take 2 capsules (600 mg total) by mouth at bedtime. (  Patient taking differently: Take 300 mg by mouth at bedtime as needed (pain).) 180 capsule 4   metoprolol tartrate (LOPRESSOR) 50 MG tablet Take 1 tablet (50 mg total) by mouth 2 (two) times daily. (Patient taking differently: Take 25 mg by mouth 2 (two) times daily.) 60 tablet 3   Multiple Vitamins-Minerals (MULTIVITAMIN WITH MINERALS) tablet Take 1 tablet by mouth daily.     pantoprazole (PROTONIX) 40 MG tablet Take 1 tablet (40 mg total) by mouth daily. 45 tablet 0   rOPINIRole (REQUIP) 1 MG tablet Take 1 tablet (1 mg total) by mouth 4 (four) times daily. 360 tablet 4   rosuvastatin (CRESTOR) 10 MG tablet Take 1 tablet (10 mg total) by mouth daily. 90 tablet 3   traZODone (DESYREL) 50 MG tablet Take 0.5 tablets by mouth at bedtime.     zanubrutinib (BRUKINSA) 80 MG capsule Take 2 capsules (160 mg total) by mouth 2 (two) times daily. 120 capsule 1   No current facility-administered medications for this visit.    Review of Systems  Constitutional:  Negative for appetite change, chills, fatigue and fever.  HENT:   Negative for  hearing loss and voice change.   Eyes:  Negative for eye problems.  Respiratory:  Negative for chest tightness and cough.   Cardiovascular:  Negative for chest pain.  Gastrointestinal:  Negative for abdominal distention, abdominal pain and blood in stool.  Endocrine: Negative for hot flashes.       Night sweat  Genitourinary:  Negative for difficulty urinating and frequency.   Musculoskeletal:  Negative for arthralgias.  Skin:  Negative for itching and rash.  Neurological:  Negative for extremity weakness.  Hematological:  Negative for adenopathy.  Psychiatric/Behavioral:  Positive for sleep disturbance. Negative for confusion.      PHYSICAL EXAMINATION: ECOG PERFORMANCE STATUS: 1 - Symptomatic but completely ambulatory  Vitals:   05/03/22 1113  BP: 135/69  Pulse: 63  Resp: 18  Temp: (!) 96.8 F (36 C)   Filed Weights   05/03/22 1113  Weight: 254 lb 9.6 oz (115.5 kg)    Physical Exam Constitutional:      General: She is not in acute distress.    Appearance: She is obese. She is not diaphoretic.  HENT:     Head: Normocephalic and atraumatic.     Nose: Nose normal.     Mouth/Throat:     Pharynx: No oropharyngeal exudate.  Eyes:     General: No scleral icterus.    Pupils: Pupils are equal, round, and reactive to light.  Cardiovascular:     Rate and Rhythm: Normal rate and regular rhythm.     Heart sounds: No murmur heard. Pulmonary:     Effort: Pulmonary effort is normal. No respiratory distress.     Breath sounds: No rales.  Chest:     Chest wall: No tenderness.  Abdominal:     General: There is no distension.     Palpations: Abdomen is soft.     Tenderness: There is no abdominal tenderness.  Musculoskeletal:        General: Normal range of motion.     Cervical back: Normal range of motion and neck supple.  Skin:    General: Skin is warm and dry.     Findings: No erythema.  Neurological:     Mental Status: She is alert and oriented to person, place, and  time.     Cranial Nerves: No cranial nerve deficit.     Motor: No  abnormal muscle tone.     Coordination: Coordination normal.  Psychiatric:        Mood and Affect: Affect normal.    LABORATORY DATA:  I have reviewed the data as listed    Latest Ref Rng & Units 05/03/2022   10:54 AM 03/15/2022   10:03 AM 03/01/2022   10:47 AM  CBC  WBC 4.0 - 10.5 K/uL 25.9  18.2  15.0   Hemoglobin 12.0 - 15.0 g/dL 13.5  13.8  13.3   Hematocrit 36.0 - 46.0 % 41.2  40.5  39.3   Platelets 150 - 400 K/uL 187  208  184       Latest Ref Rng & Units 05/03/2022   10:54 AM 04/14/2022   10:10 AM 03/15/2022   10:03 AM  CMP  Glucose 70 - 99 mg/dL 103  86  99   BUN 8 - 23 mg/dL _0 Creatinine 0.44 - 1.00 mg/dL 0.71  0.87  0.75   Sodium 135 - 145 mmol/L 133  142  138   Potassium 3.5 - 5.1 mmol/L 4.5  4.0  4.9   Chloride 98 - 111 mmol/L 100  100  104   CO2 22 - 32 mmol/L _1 Calcium 8.9 - 10.3 mg/dL 9.0  9.9  9.3   Total Protein 6.5 - 8.1 g/dL 7.7  6.9  7.0   Total Bilirubin 0.3 - 1.2 mg/dL 0.5  0.4  0.6   Alkaline Phos 38 - 126 U/L 85  93  78   AST 15 - 41 U/L 27  36  31   ALT 0 - 44 U/L 28  40  30    Lab Results  Component Value Date   LDH 192 03/15/2022    RADIOGRAPHIC STUDIES: I have personally reviewed the radiological images as listed and agreed with the findings in the report. No results found.

## 2022-05-03 NOTE — Assessment & Plan Note (Deleted)
Continue

## 2022-05-03 NOTE — Progress Notes (Signed)
[place]  [phone] [phone]  Patient Care Team: Venita Lick, NP as PCP - General (Nurse Practitioner) Vickie Epley, MD as PCP - Electrophysiology (Cardiology) Rico Junker, RN as Registered Nurse Theodore Demark, RN (Inactive) as Registered Nurse   Name of the patient: Erika Anderson  [id number]  Jun 20, 1950   Date of visit: 05/03/22  HPI: Patient is a 71 y.o. female with newly diagnosed CLL. She started treatment with zanubrutinib on 04/19/22.  Reason for Consult: Oral chemotherapy follow-up for zanubrutinib therapy.   PAST MEDICAL HISTORY: Past Medical History:  Diagnosis Date   Atrial fibrillation ([place])    Hyperlipidemia    Leukemia ([place])    Obesity (BMI 30-39.9)    Restless legs syndrome (RLS) 12/28/2014   RLS (restless legs syndrome)     HEMATOLOGY/ONCOLOGY HISTORY:  Oncology History  CLL (chronic lymphocytic leukemia) ([place])  03/02/2022 Initial Diagnosis   CLL (chronic lymphocytic leukemia)  -03/15/22 Flowcytometry showed chronic lymphocytic leukemia, B cell, CD38 expression indeterminate for prognosis (23%) and CD20 positive.  45% nuclei positive for TP53 gene deletion.    03/26/2022 Imaging   US abdomen 1. No sonographic etiology for night sweats identified. 2. Spleen is at the upper limits of normal in size    04/01/2022 Cancer Staging   Staging form: Chronic Lymphocytic Leukemia / Small Lymphocytic Lymphoma, AJCC 8th Edition - Clinical: Modified Rai Stage 0 (Modified Rai risk: Low, Lymphocytosis: Present, Adenopathy: Absent, Organomegaly: Absent, Anemia: Absent, Thrombocytopenia: Absent) - Signed by Earlie Server, MD on 04/01/2022 Stage prefix: Initial diagnosis Absolute lymphocyte count (ALC) (cells/uL): 18     ALLERGIES:  has No Known Allergies.  MEDICATIONS:  Current Outpatient Medications  Medication Sig Dispense Refill   citalopram (CELEXA) 20 MG tablet Take 1 tablet  (20 mg total) by mouth daily. 90 tablet 4   ELIQUIS 5 MG TABS tablet Take 1 tablet by mouth twice daily 180 tablet 1   gabapentin (NEURONTIN) 300 MG capsule Take 2 capsules (600 mg total) by mouth at bedtime. (Patient taking differently: Take 300 mg by mouth at bedtime as needed (pain).) 180 capsule 4   metoprolol tartrate (LOPRESSOR) 50 MG tablet Take 1 tablet (50 mg total) by mouth 2 (two) times daily. (Patient taking differently: Take 25 mg by mouth 2 (two) times daily.) 60 tablet 3   Multiple Vitamins-Minerals (MULTIVITAMIN WITH MINERALS) tablet Take 1 tablet by mouth daily.     pantoprazole (PROTONIX) 40 MG tablet Take 1 tablet (40 mg total) by mouth daily. 45 tablet 0   rOPINIRole (REQUIP) 1 MG tablet Take 1 tablet (1 mg total) by mouth 4 (four) times daily. 360 tablet 4   rosuvastatin (CRESTOR) 10 MG tablet Take 1 tablet (10 mg total) by mouth daily. 90 tablet 3   zanubrutinib (BRUKINSA) 80 MG capsule Take 2 capsules (160 mg total) by mouth 2 (two) times daily. 120 capsule 1   No current facility-administered medications for this visit.    VITAL SIGNS: There were no vitals taken for this visit. There were no vitals filed for this visit.  Estimated body mass index is 41.22 kg/m as calculated from the following:   Height as of 04/19/22: _0  (1.676 m).   Weight as of 04/19/22: 115.8 kg (255 lb 6.4 oz).  LABS: CBC:    Component Value Date/Time   WBC 18.2 (H) 03/15/2022 1003   HGB 13.8 03/15/2022 1003   HGB 14.0 02/19/2022 0917   HCT 40.5  03/15/2022 1003   HCT 41.9 02/19/2022 0917   PLT 208 03/15/2022 1003   PLT 199 02/19/2022 0917   MCV 90.4 03/15/2022 1003   MCV 92 02/19/2022 0917   NEUTROABS 5.1 03/15/2022 1003   NEUTROABS 4.7 02/19/2022 0917   LYMPHSABS 11.9 (H) 03/15/2022 1003   LYMPHSABS 10.9 (H) 02/19/2022 0917   MONOABS 1.0 03/15/2022 1003   EOSABS 0.2 03/15/2022 1003   EOSABS 0.2 02/19/2022 0917   BASOSABS 0.1 03/15/2022 1003   BASOSABS 0.1 02/19/2022 0917    Comprehensive Metabolic Panel:    Component Value Date/Time   NA 142 04/14/2022 1010   K 4.0 04/14/2022 1010   CL 100 04/14/2022 1010   CO2 25 04/14/2022 1010   BUN 15 04/14/2022 1010   CREATININE 0.87 04/14/2022 1010   GLUCOSE 86 04/14/2022 1010   GLUCOSE 99 03/15/2022 1003   CALCIUM 9.9 04/14/2022 1010   AST 36 04/14/2022 1010   ALT 40 (H) 04/14/2022 1010   ALKPHOS 93 04/14/2022 1010   BILITOT 0.4 04/14/2022 1010   PROT 6.9 04/14/2022 1010   ALBUMIN 4.4 04/14/2022 1010     Present during today's visit: patient and her husband  Assessment and Plan: CMP/CBC reviewed with patient, increase ALC likely due to starting the zanubrutinib, will continue to monitor Continue zanubrutinib 130m twice daily   Oral Chemotherapy Side Effect/Intolerance:  Rash: red spots on arms and legs, then come and go on there own, encourage patient to use OTC hydrocortone on those spot if needed No reported diarrhea and only mild fatigue, not impacting her ability to complete normal daily activities.   Oral Chemotherapy Adherence: no doses missed since starting No patient barriers to medication adherence identified.   New medications: None reported  Medication Access Issues: No issues[facility]  Patient expressed understanding and was in agreement with this plan. She also understands that She can call clinic at any time with any questions, concerns, or complaints.   Follow-up plan: RTC in one month  Thank you for allowing me to participate in the care of this very pleasant patient.   Time Total: 15 mins  Visit consisted of counseling and education on dealing with issues of symptom management in the setting of serious and potentially life-threatening illness.Greater than 50%  of this time was spent counseling and coordinating care related to the above assessment and plan.  Signed by: ADarl Pikes PharmD, BCPS, BSalley Slaughter CPP Hematology/Oncology  Clinical Pharmacist Practitioner Chambers/DB/AP Oral [phone] 05/03/2022 9:48 AM

## 2022-05-03 NOTE — Progress Notes (Signed)
Pt here for follow up. Pt reports she gets small red spots to different places in body. This started after she started taking Brukinsa

## 2022-05-03 NOTE — Assessment & Plan Note (Signed)
Possible medication related.  Currently symptom is mild. Observe.

## 2022-05-03 NOTE — Assessment & Plan Note (Signed)
Continue Zanubrutinib '160mg'$  BID

## 2022-05-04 ENCOUNTER — Other Ambulatory Visit (HOSPITAL_COMMUNITY): Payer: Self-pay

## 2022-05-05 ENCOUNTER — Other Ambulatory Visit (HOSPITAL_COMMUNITY): Payer: Self-pay

## 2022-05-06 ENCOUNTER — Other Ambulatory Visit (HOSPITAL_COMMUNITY): Payer: Self-pay

## 2022-05-12 ENCOUNTER — Other Ambulatory Visit (HOSPITAL_COMMUNITY): Payer: Self-pay

## 2022-05-17 ENCOUNTER — Encounter: Payer: Self-pay | Admitting: Cardiology

## 2022-05-17 ENCOUNTER — Ambulatory Visit: Payer: Medicare HMO | Attending: Cardiology | Admitting: Cardiology

## 2022-05-17 VITALS — BP 122/70 | HR 59 | Ht 66.0 in | Wt 255.0 lb

## 2022-05-17 DIAGNOSIS — E782 Mixed hyperlipidemia: Secondary | ICD-10-CM

## 2022-05-17 DIAGNOSIS — I4819 Other persistent atrial fibrillation: Secondary | ICD-10-CM

## 2022-05-17 MED ORDER — METOPROLOL TARTRATE 50 MG PO TABS: 25.0000 mg | ORAL_TABLET | Freq: Two times a day (BID) | ORAL | 0 refills | Status: DC

## 2022-05-17 NOTE — Patient Instructions (Signed)
Medication Instructions:   Your physician recommends that you continue on your current medications as directed. Please refer to the Current Medication list given to you today.  *If you need a refill on your cardiac medications before your next appointment, please call your pharmacy*   Lab Work:  None ordered  If you have labs (blood work) drawn today and your tests are completely normal, you will receive your results only by: [place] (if you have MyChart) OR A paper copy in the mail If you have any lab test that is abnormal or we need to change your treatment, we will call you to review the results.   Testing/Procedures:  None Ordered   Follow-Up: At [facility], you and your health needs are our priority.  As part of our continuing mission to provide you with exceptional heart care, we have created designated Provider Care Teams.  These Care Teams include your primary Cardiologist (physician) and Advanced Practice Providers (APPs -  Physician Assistants and Nurse Practitioners) who all work together to provide you with the care you need, when you need it.  We recommend signing up for the patient portal called "MyChart".  Sign up information is provided on this After Visit Summary.  MyChart is used to connect with patients for Virtual Visits (Telemedicine).  Patients are able to view lab/test results, encounter notes, upcoming appointments, etc.  Non-urgent messages can be sent to your provider as well.   To learn more about what you can do with MyChart, go to [email or web address].    Your next appointment:   12 month(s)  The format for your next appointment:   In Person  Provider:   You may see Kate Sable, MD or one of the following Advanced Practice Providers on your designated Care Team:   Murray Hodgkins, NP Christell Faith, PA-C Cadence Kathlen Mody, PA-C Gerrie Nordmann, NP

## 2022-05-17 NOTE — Progress Notes (Signed)
Cardiology Office Note:    Date:  05/17/2022   ID:  Erika Anderson, DOB May 18, 1951, MRN [id number]  PCP:  Venita Lick, NP   [facility] Providers Cardiologist:  Kate Sable, MD Electrophysiologist:  Vickie Epley, MD     Referring MD: Venita Lick, NP   Chief Complaint  Patient presents with   Follow-up    6 month follow up, no new cardiac concerns     History of Present Illness:    Erika Anderson is a 71 y.o. female with a hx of A-fib/atrial flutter s/p ablation 03/22/2022, hyperlipidemia, obesity, CLL who presents for follow-up.  Being seen for persistent A-fib/flutter, had an ablation 4 months ago successfully.  Denies symptoms of palpitations, tolerating Eliquis and Lopressor as prescribed.  Recently diagnosed with CLL, managed at the cancer center.  Overall she feels okay, no bleeding issues with Eliquis.  Prior notes Echocardiogram 10/2021 EF 50%. Underwent a stress echo 2020 at [place], EF normal, no inducible ischemia.  Past Medical History:  Diagnosis Date   Atrial fibrillation ([place])    Hyperlipidemia    Leukemia ([place])    Obesity (BMI 30-39.9)    Restless legs syndrome (RLS) 12/28/2014   RLS (restless legs syndrome)     Past Surgical History:  Procedure Laterality Date   ATRIAL FIBRILLATION ABLATION N/A 03/22/2022   Procedure: ATRIAL FIBRILLATION ABLATION;  Surgeon: Vickie Epley, MD;  Location: [place] [facility];  Service: Cardiovascular;  Laterality: N/A;   COLONOSCOPY WITH PROPOFOL N/A 08/24/2021   Procedure: COLONOSCOPY WITH PROPOFOL;  Surgeon: Jonathon Bellows, MD;  Location: [facility];  Service: Gastroenterology;  Laterality: N/A;   HEMORROIDECTOMY     TOTAL ABDOMINAL HYSTERECTOMY     ovaries remain    Current Medications: Current Meds  Medication Sig   citalopram (CELEXA) 20 MG tablet Take 1 tablet (20 mg total) by mouth daily.   colchicine 0.6 MG tablet Take 0.6 mg by mouth 2 (two) times daily.   ELIQUIS 5 MG  TABS tablet Take 1 tablet by mouth twice daily   gabapentin (NEURONTIN) 300 MG capsule Take 2 capsules (600 mg total) by mouth at bedtime. (Patient taking differently: Take 300 mg by mouth at bedtime as needed (pain).)   Multiple Vitamins-Minerals (MULTIVITAMIN WITH MINERALS) tablet Take 1 tablet by mouth daily.   rOPINIRole (REQUIP) 1 MG tablet Take 1 tablet (1 mg total) by mouth 4 (four) times daily.   rosuvastatin (CRESTOR) 10 MG tablet Take 1 tablet (10 mg total) by mouth daily.   traZODone (DESYREL) 50 MG tablet Take 0.5 tablets by mouth at bedtime.   zanubrutinib (BRUKINSA) 80 MG capsule Take 2 capsules (160 mg total) by mouth 2 (two) times daily.   [DISCONTINUED] metoprolol tartrate (LOPRESSOR) 50 MG tablet Take 1 tablet (50 mg total) by mouth 2 (two) times daily. (Patient taking differently: Take 25 mg by mouth 2 (two) times daily.)     Allergies:   Patient has no known allergies.   Social History   Socioeconomic History   Marital status: Married    Spouse name: Not on file   Number of children: Not on file   Years of education: 12   Highest education level: High school graduate  Occupational History   Occupation: retired  Tobacco Use   Smoking status: Never   Smokeless tobacco: Never   Tobacco comments:    Never smoke 04/19/22  Vaping Use   Vaping Use: Never used  Substance and Sexual Activity  Alcohol use: No    Alcohol/week: 0.0 standard drinks of alcohol   Drug use: No   Sexual activity: Not Currently  Other Topics Concern   Not on file  Social History Narrative   Helps take care of grandchildren    Social Determinants of Health   Financial Resource Strain: Low Risk  (03/02/2022)   Overall Financial Resource Strain (CARDIA)    Difficulty of Paying Living Expenses: Not hard at all  Food Insecurity: No Food Insecurity (03/02/2022)   Hunger Vital Sign    Worried About Running Out of Food in the Last Year: Never true    Ran Out of Food in the Last Year: Never  true  Transportation Needs: No Transportation Needs (02/27/2021)   PRAPARE - Hydrologist (Medical): No    Lack of Transportation (Non-Medical): No  Physical Activity: Inactive (03/02/2022)   Exercise Vital Sign    Days of Exercise per Week: 0 days    Minutes of Exercise per Session: 0 min  Stress: No Stress Concern Present (03/02/2022)   [place]    Feeling of Stress : Not at all  Social Connections: [place] (03/02/2022)   Social Connection and Isolation Panel [NHANES]    Frequency of Communication with Friends and Family: More than three times a week    Frequency of Social Gatherings with Friends and Family: Twice a week    Attends Religious Services: More than 4 times per year    Active Member of [employer] or Organizations: Yes    Attends Music therapist: More than 4 times per year    Marital Status: Married     Family History: The patient's family history includes Arthritis in her sister; Cancer in her mother and paternal grandfather; Dementia in her father; Heart disease in her brother; Stroke in her paternal grandmother. There is no history of Breast cancer.  ROS:   Please see the history of present illness.     All other systems reviewed and are negative.  EKGs/Labs/Other Studies Reviewed:    The following studies were reviewed today:   EKG:  EKG is  ordered today.  The ekg ordered today demonstrates sinus bradycardia at 59  Recent Labs: 07/30/2021: TSH 2.120 01/27/2022: Magnesium 2.0 05/03/2022: ALT 28; BUN 17; Creatinine, Ser 0.71; Hemoglobin 13.5; Platelets 187; Potassium 4.5; Sodium 133  Recent Lipid Panel    Component Value Date/Time   CHOL 156 04/14/2022 1010   TRIG 113 04/14/2022 1010   HDL 56 04/14/2022 1010   LDLCALC 80 04/14/2022 1010     Risk Assessment/Calculations:          Physical Exam:    VS:  BP 122/70 (BP Location: Left Arm,  Patient Position: Sitting, Cuff Size: Large)   Pulse (!) 59   Ht '5\' 6"'$  (1.676 m)   Wt 255 lb (115.7 kg)   SpO2 97%   BMI 41.16 kg/m     Wt Readings from Last 3 Encounters:  05/17/22 255 lb (115.7 kg)  05/03/22 254 lb 9.6 oz (115.5 kg)  04/19/22 255 lb 6.4 oz (115.8 kg)     GEN:  Well nourished, well developed in no acute distress HEENT: Normal NECK: No JVD; No carotid bruits CARDIAC: Regular, bradycardic RESPIRATORY:  Clear to auscultation without rales, wheezing or rhonchi  ABDOMEN: Soft, non-tender, non-distended MUSCULOSKELETAL:  trace edema; varicose veins noted SKIN: Warm and dry NEUROLOGIC:  Alert and oriented x 3  PSYCHIATRIC:  Normal affect   ASSESSMENT:    1. Persistent atrial fibrillation ([place])   2. Mixed hyperlipidemia   3. Morbid obesity ([place])    PLAN:    In order of problems listed above:  A-fib/atrial flutter s/p RFA 03/22/2022.  Currently in sinus rhythm.  Continue Lopressor 25 mg twice daily, continue Eliquis 5 mg twice daily.   Hyperlipidemia,  Continue Crestor 10 mg daily. Obesity, low-calorie diet, weight loss advised.  Follow-up in 1 year.     Medication Adjustments/Labs and Tests Ordered: Current medicines are reviewed at length with the patient today.  Concerns regarding medicines are outlined above.  Orders Placed This Encounter  Procedures   EKG 12-Lead   Meds ordered this encounter  Medications   metoprolol tartrate (LOPRESSOR) 50 MG tablet    Sig: Take 0.5 tablets (25 mg total) by mouth 2 (two) times daily.    Dispense:  90 tablet    Refill:  0    Patient Instructions  Medication Instructions:   Your physician recommends that you continue on your current medications as directed. Please refer to the Current Medication list given to you today.  *If you need a refill on your cardiac medications before your next appointment, please call your pharmacy*   Lab Work:  None ordered  If you have labs (blood work) drawn today and your  tests are completely normal, you will receive your results only by: [place] (if you have MyChart) OR A paper copy in the mail If you have any lab test that is abnormal or we need to change your treatment, we will call you to review the results.   Testing/Procedures:  None Ordered   Follow-Up: At [facility], you and your health needs are our priority.  As part of our continuing mission to provide you with exceptional heart care, we have created designated Provider Care Teams.  These Care Teams include your primary Cardiologist (physician) and Advanced Practice Providers (APPs -  Physician Assistants and Nurse Practitioners) who all work together to provide you with the care you need, when you need it.  We recommend signing up for the patient portal called "MyChart".  Sign up information is provided on this After Visit Summary.  MyChart is used to connect with patients for Virtual Visits (Telemedicine).  Patients are able to view lab/test results, encounter notes, upcoming appointments, etc.  Non-urgent messages can be sent to your provider as well.   To learn more about what you can do with MyChart, go to [email or web address].    Your next appointment:   12 month(s)  The format for your next appointment:   In Person  Provider:   You may see Kate Sable, MD or one of the following Advanced Practice Providers on your designated Care Team:   Murray Hodgkins, NP Christell Faith, PA-C Cadence Kathlen Mody, PA-C Gerrie Nordmann, NP     Signed, Kate Sable, MD  05/17/2022 11:58 AM    [place]

## 2022-05-18 LAB — FISH HES LEUKEMIA, 4Q12 REA

## 2022-05-18 LAB — IGVH SOMATIC HYPERMUTATION

## 2022-05-24 ENCOUNTER — Telehealth: Payer: Self-pay | Admitting: Cardiology

## 2022-05-24 MED ORDER — PANTOPRAZOLE SODIUM 40 MG PO TBEC: 40.0000 mg | DELAYED_RELEASE_TABLET | Freq: Every day | ORAL | 3 refills | Status: DC

## 2022-05-24 NOTE — Telephone Encounter (Signed)
*  STAT* If patient is at the pharmacy, call can be transferred to refill team.   1. Which medications need to be refilled? (please list name of each medication and dose if known)  pantoprazole (PROTONIX) 40 MG tablet (Expired)   2. Which pharmacy/location (including street and city if local pharmacy) is medication to be sent to?  [place], [place]    3. Do they need a 30 day or 90 day supply? [place]

## 2022-05-24 NOTE — Telephone Encounter (Signed)
Please advise if ok to refill non cardiac medication. 

## 2022-05-24 NOTE — Telephone Encounter (Signed)
Last filled by:  [place] [facility] Ordering/Authorizing: Shirley Friar, PA-C

## 2022-05-24 NOTE — Telephone Encounter (Signed)
Verbal discussion with Dr. Garen Lah and approval for refill of her protonix.

## 2022-05-27 DIAGNOSIS — I890 Lymphedema, not elsewhere classified: Secondary | ICD-10-CM | POA: Diagnosis not present

## 2022-06-02 ENCOUNTER — Other Ambulatory Visit (HOSPITAL_COMMUNITY): Payer: Self-pay

## 2022-06-02 ENCOUNTER — Other Ambulatory Visit: Payer: Self-pay | Admitting: Oncology

## 2022-06-02 DIAGNOSIS — C9110 Chronic lymphocytic leukemia of B-cell type not having achieved remission: Secondary | ICD-10-CM

## 2022-06-02 MED ORDER — BRUKINSA 80 MG PO CAPS
160.0000 mg | ORAL_CAPSULE | Freq: Two times a day (BID) | ORAL | 1 refills | Status: DC
  Filled 2022-06-03: qty 120, 30d supply, fill #0
  Filled 2022-06-29: qty 120, 30d supply, fill #1

## 2022-06-03 ENCOUNTER — Inpatient Hospital Stay: Payer: Medicare HMO | Admitting: Oncology

## 2022-06-03 ENCOUNTER — Other Ambulatory Visit (HOSPITAL_COMMUNITY): Payer: Self-pay

## 2022-06-03 ENCOUNTER — Inpatient Hospital Stay: Payer: Medicare HMO

## 2022-06-08 ENCOUNTER — Other Ambulatory Visit: Payer: Self-pay

## 2022-06-08 ENCOUNTER — Other Ambulatory Visit (HOSPITAL_COMMUNITY): Payer: Self-pay

## 2022-06-10 ENCOUNTER — Encounter: Payer: Self-pay | Admitting: Oncology

## 2022-06-10 ENCOUNTER — Inpatient Hospital Stay: Payer: Medicare HMO | Admitting: Oncology

## 2022-06-10 ENCOUNTER — Inpatient Hospital Stay: Payer: Medicare HMO | Attending: Oncology

## 2022-06-10 VITALS — BP 155/72 | HR 60 | Temp 96.6°F | Wt 253.7 lb

## 2022-06-10 DIAGNOSIS — Z7901 Long term (current) use of anticoagulants: Secondary | ICD-10-CM | POA: Insufficient documentation

## 2022-06-10 DIAGNOSIS — Z8249 Family history of ischemic heart disease and other diseases of the circulatory system: Secondary | ICD-10-CM | POA: Diagnosis not present

## 2022-06-10 DIAGNOSIS — G479 Sleep disorder, unspecified: Secondary | ICD-10-CM | POA: Diagnosis not present

## 2022-06-10 DIAGNOSIS — G2581 Restless legs syndrome: Secondary | ICD-10-CM | POA: Insufficient documentation

## 2022-06-10 DIAGNOSIS — Z5111 Encounter for antineoplastic chemotherapy: Secondary | ICD-10-CM

## 2022-06-10 DIAGNOSIS — Z1501 Genetic susceptibility to malignant neoplasm of breast: Secondary | ICD-10-CM | POA: Insufficient documentation

## 2022-06-10 DIAGNOSIS — Z8261 Family history of arthritis: Secondary | ICD-10-CM | POA: Insufficient documentation

## 2022-06-10 DIAGNOSIS — I4891 Unspecified atrial fibrillation: Secondary | ICD-10-CM | POA: Insufficient documentation

## 2022-06-10 DIAGNOSIS — Z818 Family history of other mental and behavioral disorders: Secondary | ICD-10-CM | POA: Diagnosis not present

## 2022-06-10 DIAGNOSIS — E785 Hyperlipidemia, unspecified: Secondary | ICD-10-CM | POA: Insufficient documentation

## 2022-06-10 DIAGNOSIS — E669 Obesity, unspecified: Secondary | ICD-10-CM | POA: Diagnosis not present

## 2022-06-10 DIAGNOSIS — Z823 Family history of stroke: Secondary | ICD-10-CM | POA: Diagnosis not present

## 2022-06-10 DIAGNOSIS — Z79899 Other long term (current) drug therapy: Secondary | ICD-10-CM | POA: Insufficient documentation

## 2022-06-10 DIAGNOSIS — Z1502 Genetic susceptibility to malignant neoplasm of ovary: Secondary | ICD-10-CM | POA: Insufficient documentation

## 2022-06-10 DIAGNOSIS — Z8041 Family history of malignant neoplasm of ovary: Secondary | ICD-10-CM | POA: Diagnosis not present

## 2022-06-10 DIAGNOSIS — C9110 Chronic lymphocytic leukemia of B-cell type not having achieved remission: Secondary | ICD-10-CM

## 2022-06-10 DIAGNOSIS — Z1509 Genetic susceptibility to other malignant neoplasm: Secondary | ICD-10-CM | POA: Diagnosis not present

## 2022-06-10 DIAGNOSIS — Z801 Family history of malignant neoplasm of trachea, bronchus and lung: Secondary | ICD-10-CM | POA: Diagnosis not present

## 2022-06-10 DIAGNOSIS — Z90710 Acquired absence of both cervix and uterus: Secondary | ICD-10-CM | POA: Diagnosis not present

## 2022-06-10 LAB — CBC WITH DIFFERENTIAL/PLATELET
Abs Immature Granulocytes: 0.02 10*3/uL (ref 0.00–0.07)
Basophils Absolute: 0.1 10*3/uL (ref 0.0–0.1)
Basophils Relative: 0 %
Eosinophils Absolute: 0.1 10*3/uL (ref 0.0–0.5)
Eosinophils Relative: 0 %
HCT: 39.4 % (ref 36.0–46.0)
Hemoglobin: 13.2 g/dL (ref 12.0–15.0)
Immature Granulocytes: 0 %
Lymphocytes Relative: 88 %
Lymphs Abs: 23.8 10*3/uL — ABNORMAL HIGH (ref 0.7–4.0)
MCH: 30.7 pg (ref 26.0–34.0)
MCHC: 33.5 g/dL (ref 30.0–36.0)
MCV: 91.6 fL (ref 80.0–100.0)
Monocytes Absolute: 0.6 10*3/uL (ref 0.1–1.0)
Monocytes Relative: 2 %
Neutro Abs: 2.6 10*3/uL (ref 1.7–7.7)
Neutrophils Relative %: 10 %
Platelets: 190 10*3/uL (ref 150–400)
RBC: 4.3 MIL/uL (ref 3.87–5.11)
RDW: 12.5 % (ref 11.5–15.5)
Smear Review: NORMAL
WBC: 27.3 10*3/uL — ABNORMAL HIGH (ref 4.0–10.5)
nRBC: 0 % (ref 0.0–0.2)

## 2022-06-10 LAB — COMPREHENSIVE METABOLIC PANEL
ALT: 23 U/L (ref 0–44)
AST: 23 U/L (ref 15–41)
Albumin: 4 g/dL (ref 3.5–5.0)
Alkaline Phosphatase: 76 U/L (ref 38–126)
Anion gap: 8 (ref 5–15)
BUN: 19 mg/dL (ref 8–23)
CO2: 25 mmol/L (ref 22–32)
Calcium: 8.9 mg/dL (ref 8.9–10.3)
Chloride: 104 mmol/L (ref 98–111)
Creatinine, Ser: 0.89 mg/dL (ref 0.44–1.00)
GFR, Estimated: >60 mL/min (ref >60)
Glucose, Bld: 97 mg/dL (ref 70–99)
Potassium: 4.5 mmol/L (ref 3.5–5.1)
Sodium: 137 mmol/L (ref 135–145)
Total Bilirubin: 0.6 mg/dL (ref 0.3–1.2)
Total Protein: 7.2 g/dL (ref 6.5–8.1)

## 2022-06-10 NOTE — Assessment & Plan Note (Signed)
Continue Zanubrutinib '160mg'$  BID

## 2022-06-10 NOTE — Assessment & Plan Note (Addendum)
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 Rapid lymphocyte increment, 50% over 2 months period Labs are reviewed and discussed with patient. Continue Zanubrutinib 141m BID, clinically she tolerates well. She is on Elqiuis, will watch for possible increase risk of bleeding.  Leukocytosis is expected as side effects of the treatment.  Expect to decrease in the near future.

## 2022-06-10 NOTE — Progress Notes (Signed)
Hematology/Oncology Consult Note [phone] [phone]     Patient Care Team: Venita Lick, NP as PCP - General (Nurse Practitioner) Vickie Epley, MD as PCP - Electrophysiology (Cardiology) Kate Sable, MD as PCP - Cardiology (Cardiology) Rico Junker, RN as Registered Nurse Theodore Demark, RN (Inactive) as Registered Nurse  ASSESSMENT & PLAN  Cancer Staging  CLL (chronic lymphocytic leukemia) [facility]) Staging form: Chronic Lymphocytic Leukemia / Small Lymphocytic Lymphoma, AJCC 8th Edition - Clinical: Modified Rai Stage 0 (Modified Rai risk: Low, Lymphocytosis: Present, Adenopathy: Absent, Organomegaly: Absent, Anemia: Absent, Thrombocytopenia: Absent) - Signed by Earlie Server, MD on 04/01/2022   CLL (chronic lymphocytic leukemia) ([place]) CLL +TP53 mutation, + constitutional symptoms [night sweat daily]. Rapid lymphocyte increment, 50% over 2 months period Labs are reviewed and discussed with patient. Continue Zanubrutinib 154m BID, clinically she tolerates well. She is on Elqiuis, will watch for possible increase risk of bleeding.  Leukocytosis is expected as side effects of the treatment.  Expect to decrease in the near future.  Encounter for antineoplastic chemotherapy Continue Zanubrutinib 1649mBID    Follow up 4 weeks.   ZhEarlie ServerMD, PhD [facility] 06/10/2022       CHIEF COMPLAINTS/PURPOSE OF CONSULTATION:  CLL  HISTORY OF PRESENTING ILLNESS:  [name].o. female presents to follow up with CLL.  She was found to have abnormal CBC on 03/01/22, total white count of 15, predominantly lymphocytosis. This is chronic since Feb 2023. Peripheral blood smear + smudge cells.  + Restless leg syndrome, sleep disturbance for 1 year.  + Family history of ovarian cancer -mother.    Oncology History  [place]ocytic [place]) [place] 03/[place]/2022 Initial Diagnosis   CLL (chronic lymphocytic  leukemia)  -03/15/22 Flowcytometry showed chronic lymphocytic leukemia, B cell, CD38 expression indeterminate for prognosis (23%) and CD20 positive.  45% nuclei positive for TP53 gene deletion.    03/26/2022 Imaging   USKoreabdomen 1. No sonographic etiology for night sweats identified. 2. Spleen is at the upper limits of normal in size    04/01/2022 Cancer Staging   Staging form: Chronic Lymphocytic Leukemia / Small Lymphocytic Lymphoma, AJCC 8th Edition - Clinical: Modified Rai Stage 0 (Modified Rai risk: Low, Lymphocytosis: Present, Adenopathy: Absent, Organomegaly: Absent, Anemia: Absent, Thrombocytopenia: Absent) - Signed by YuEarlie ServerMD on 04/01/2022 Stage prefix: Initial diagnosis Absolute lymphocyte count (ALC) (cells/uL): 18   04/19/2022 -  Chemotherapy   Started on Zanubrutinib 16075mID      She reports tolerating Zanubrutinib 160m27mD well. Night sweat has decreased  frequency.No other new complaints.    MEDICAL HISTORY:  Past Medical History:  Diagnosis Date   Atrial fibrillation (H[facility])    Hyperlipidemia    Leukemia (H[facility])[place] Obesity (BMI 30-39.9)    Restless legs syndrome (RLS) 12/28/2014   RLS (restless legs syndrome)     SURGICAL HISTORY: Past Surgical History:  Procedure Laterality Date   ATRIAL FIBRILLATION ABLATION N/A 03/22/2022   Procedure: ATRIAL FIBRILLATION ABLATION;  Surgeon: LambVickie Epley;  [facility];  Service: Cardiovascular;  Laterality: N/A;   COLONOSCOPY WITH PROPOFOL N/A 08/24/2021   Procedure: COLONOSCOPY WITH PROPOFOL;  Surgeon: AnnaJonathon Bellows;  [facility];  Service: Gastroenterology;  Laterality: N/A;   HEMORROIDECTOMY     TOTAL ABDOMINAL HYSTERECTOMY     ovaries remain    SOCIAL HISTORY: Social History   Socioeconomic History   Marital status: Married  Spouse name: Not on file   Number of children: Not on file   Years of education: 12   Highest education level: High school graduate   Occupational History   Occupation: retired  Tobacco Use   Smoking status: Never   Smokeless tobacco: Never   Tobacco comments:    Never smoke 04/19/22  Vaping Use   Vaping Use: Never used  Substance and Sexual Activity   Alcohol use: No    Alcohol/week: 0.0 standard drinks of alcohol   Drug use: No   Sexual activity: Not Currently  Other Topics Concern   Not on file  Social History Narrative   Helps take care of grandchildren    Social Determinants of Health   Financial Resource Strain: Low Risk  (03/02/2022)   Overall Financial Resource Strain (CARDIA)    Difficulty of Paying Living Expenses: Not hard at all  Food Insecurity: No Food Insecurity (03/02/2022)   Hunger Vital Sign    Worried About Running Out of Food in the Last Year: Never true    [place] in the Last Year: Never true  Transportation Needs: No Transportation Needs (02/27/2021)   PRAPARE - Hydrologist (Medical): No    Lack of Transportation (Non-Medical): No  Physical Activity: Inactive (03/02/2022)   Exercise Vital Sign    Days of Exercise per Week: 0 days    Minutes of Exercise per Session: 0 min  Stress: No Stress Concern Present (03/02/2022)   [place]    Feeling of Stress : Not at all  Social Connections: [place] (03/02/2022)   Social Connection and Isolation Panel [NHANES]    Frequency of Communication with Friends and Family: More than three times a week    Frequency of Social Gatherings with Friends and Family: Twice a week    Attends Religious Services: More than 4 times per year    Active Member of [employer] or Organizations: Yes    Attends Music therapist: More than 4 times per year    Marital Status: Married  Human resources officer Violence: Not At Risk (03/02/2022)   Humiliation, Afraid, Rape, and Kick questionnaire    Fear of Current or Ex-Partner: No    Emotionally Abused: No     Physically Abused: No    Sexually Abused: No    FAMILY HISTORY: Family History  Problem Relation Age of Onset   Cancer Mother        ovarian   Dementia Father    Arthritis Sister    Heart disease Brother        MI   Stroke Paternal Grandmother    Cancer Paternal Grandfather        lung   Breast cancer Neg Hx     ALLERGIES:  has No Known Allergies.  MEDICATIONS:  Current Outpatient Medications  Medication Sig Dispense Refill   citalopram (CELEXA) 20 MG tablet Take 1 tablet (20 mg total) by mouth daily. 90 tablet 4   colchicine 0.6 MG tablet Take 0.6 mg by mouth 2 (two) times daily.     ELIQUIS 5 MG TABS tablet Take 1 tablet by mouth twice daily 180 tablet 1   gabapentin (NEURONTIN) 300 MG capsule Take 2 capsules (600 mg total) by mouth at bedtime. (Patient taking differently: Take 300 mg by mouth at bedtime as needed (pain).) 180 capsule 4   metoprolol tartrate (LOPRESSOR) 25 MG tablet Take 25 mg by  mouth 2 (two) times daily.     Multiple Vitamins-Minerals (MULTIVITAMIN WITH MINERALS) tablet Take 1 tablet by mouth daily.     pantoprazole (PROTONIX) 40 MG tablet Take 1 tablet (40 mg total) by mouth daily. 90 tablet 3   rOPINIRole (REQUIP) 1 MG tablet Take 1 tablet (1 mg total) by mouth 4 (four) times daily. 360 tablet 4   rosuvastatin (CRESTOR) 10 MG tablet Take 1 tablet (10 mg total) by mouth daily. 90 tablet 3   traZODone (DESYREL) 50 MG tablet Take 0.5 tablets by mouth at bedtime.     zanubrutinib (BRUKINSA) 80 MG capsule Take 2 capsules (160 mg total) by mouth 2 (two) times daily. 120 capsule 1   No current facility-administered medications for this visit.    Review of Systems  Constitutional:  Negative for appetite change, chills, fatigue and fever.  HENT:   Negative for hearing loss and voice change.   Eyes:  Negative for eye problems.  Respiratory:  Negative for chest tightness and cough.   Cardiovascular:  Negative for chest pain.  Gastrointestinal:  Negative  for abdominal distention, abdominal pain and blood in stool.  Endocrine: Negative for hot flashes.       Night sweat  Genitourinary:  Negative for difficulty urinating and frequency.   Musculoskeletal:  Negative for arthralgias.  Skin:  Negative for itching and rash.  Neurological:  Negative for extremity weakness.  Hematological:  Negative for adenopathy.  Psychiatric/Behavioral:  Positive for sleep disturbance. Negative for confusion.      PHYSICAL EXAMINATION: ECOG PERFORMANCE STATUS: 1 - Symptomatic but completely ambulatory  Vitals:   06/10/22 1439  BP: (!) 155/72  Pulse: 60  Temp: (!) 96.6 F (35.9 C)  SpO2: 100%   Filed Weights   06/10/22 1439  Weight: 253 lb 11.2 oz (115.1 kg)    Physical Exam Constitutional:      General: She is not in acute distress.    Appearance: She is obese. She is not diaphoretic.  HENT:     Head: Normocephalic and atraumatic.     Nose: Nose normal.     Mouth/Throat:     Pharynx: No oropharyngeal exudate.  Eyes:     General: No scleral icterus.    Pupils: Pupils are equal, round, and reactive to light.  Cardiovascular:     Rate and Rhythm: Normal rate and regular rhythm.     Heart sounds: No murmur heard. Pulmonary:     Effort: Pulmonary effort is normal. No respiratory distress.     Breath sounds: No rales.  Chest:     Chest wall: No tenderness.  Abdominal:     General: There is no distension.     Palpations: Abdomen is soft.     Tenderness: There is no abdominal tenderness.  Musculoskeletal:        General: Normal range of motion.     Cervical back: Normal range of motion and neck supple.  Skin:    General: Skin is warm and dry.     Findings: No erythema.  Neurological:     Mental Status: She is alert and oriented to person, place, and time.     Cranial Nerves: No cranial nerve deficit.     Motor: No abnormal muscle tone.     Coordination: Coordination normal.  Psychiatric:        Mood and Affect: Affect normal.      LABORATORY DATA:  I have reviewed the data as listed    Latest Ref  Rng & Units 06/10/2022    2:14 PM 05/03/2022   10:54 AM 03/15/2022   10:03 AM  CBC  WBC 4.0 - 10.5 K/uL 27.3  25.9  18.2   Hemoglobin 12.0 - 15.0 g/dL 13.2  13.5  13.8   Hematocrit 36.0 - 46.0 % 39.4  41.2  40.5   Platelets 150 - 400 K/uL 190  187  208       Latest Ref Rng & Units 06/10/2022    2:14 PM 05/03/2022   10:54 AM 04/14/2022   10:10 AM  CMP  Glucose 70 - 99 mg/dL 97  103  86   BUN 8 - 23 mg/dL _0 Creatinine 0.44 - 1.00 mg/dL 0.89  0.71  0.87   Sodium 135 - 145 mmol/L 137  133  142   Potassium 3.5 - 5.1 mmol/L 4.5  4.5  4.0   Chloride 98 - 111 mmol/L 104  100  100   CO2 22 - 32 mmol/L _1 Calcium 8.9 - 10.3 mg/dL 8.9  9.0  9.9   Total Protein 6.5 - 8.1 g/dL 7.2  7.7  6.9   Total Bilirubin 0.3 - 1.2 mg/dL 0.6  0.5  0.4   Alkaline Phos 38 - 126 U/L 76  85  93   AST 15 - 41 U/L 23  27  36   ALT 0 - 44 U/L 23  28  40    Lab Results  Component Value Date   LDH 192 03/15/2022    RADIOGRAPHIC STUDIES: I have personally reviewed the radiological images as listed and agreed with the findings in the report. No results found.

## 2022-06-16 ENCOUNTER — Telehealth: Payer: Self-pay

## 2022-06-16 ENCOUNTER — Other Ambulatory Visit (HOSPITAL_COMMUNITY): Payer: Self-pay

## 2022-06-16 NOTE — Telephone Encounter (Signed)
Oral Oncology Patient Advocate Encounter   Was successful in securing patient an $3,250 grant from Patient [facility]) to provide copayment coverage for Brukinsa.  This will keep the out of pocket expense at $0.     I have spoken with the patient.    The billing information is as follows and has been shared with [place].   Member ID: [id number] Group ID: [id number] RxBin: [id number] Dates of Eligibility: 03/12/22 through 06/10/23  Fund:  Chronic Lymphocytic [place], [place] Patient [place]  [phone] (phone) [phone] (fax) 06/16/2022 9:43 AM

## 2022-06-22 NOTE — Progress Notes (Signed)
Electrophysiology Office Follow up Visit Note:    Date:  06/23/2022   ID:  Erika Anderson, DOB 14-Aug-1950, MRN [id number]  PCP:  Venita Lick, NP  [facility] Cardiologist:  Kate Sable, MD  [facility] Electrophysiologist:  Vickie Epley, MD    Interval History:    Erika Anderson is a 72 y.o. female who presents for a follow up visit.  She had an A-fib ablation on May 22, 2022.  During the ablation the pulmonary veins and CTI were ablated.  She has done well after the ablation without recurrence of her arrhythmia.  She continues to take Eliquis for stroke prophylaxis and takes metoprolol tartrate 25 mg by mouth twice daily.       Past Medical History:  Diagnosis Date   Atrial fibrillation ([place])    Hyperlipidemia    Leukemia ([place])    Obesity (BMI 30-39.9)    Restless legs syndrome (RLS) 12/28/2014   RLS (restless legs syndrome)     Past Surgical History:  Procedure Laterality Date   ATRIAL FIBRILLATION ABLATION N/A 03/22/2022   Procedure: ATRIAL FIBRILLATION ABLATION;  Surgeon: Vickie Epley, MD;  Location: [place] [facility];  Service: Cardiovascular;  Laterality: N/A;   COLONOSCOPY WITH PROPOFOL N/A 08/24/2021   Procedure: COLONOSCOPY WITH PROPOFOL;  Surgeon: Jonathon Bellows, MD;  Location: [facility] ENDOSCOPY;  Service: Gastroenterology;  Laterality: N/A;   HEMORROIDECTOMY     TOTAL ABDOMINAL HYSTERECTOMY     ovaries remain    Current Medications: Current Meds  Medication Sig   citalopram (CELEXA) 20 MG tablet Take 1 tablet (20 mg total) by mouth daily.   ELIQUIS 5 MG TABS tablet Take 1 tablet by mouth twice daily   gabapentin (NEURONTIN) 300 MG capsule Take 2 capsules (600 mg total) by mouth at bedtime. (Patient taking differently: Take 300 mg by mouth at bedtime as needed (pain).)   metoprolol tartrate (LOPRESSOR) 25 MG tablet Take 25 mg by mouth 2 (two) times daily.   Multiple Vitamins-Minerals (MULTIVITAMIN WITH MINERALS) tablet Take 1  tablet by mouth daily.   pantoprazole (PROTONIX) 40 MG tablet Take 1 tablet (40 mg total) by mouth daily.   rOPINIRole (REQUIP) 1 MG tablet Take 1 tablet (1 mg total) by mouth 4 (four) times daily.   rosuvastatin (CRESTOR) 10 MG tablet Take 1 tablet (10 mg total) by mouth daily.   traZODone (DESYREL) 50 MG tablet Take 0.5 tablets by mouth at bedtime.   zanubrutinib (BRUKINSA) 80 MG capsule Take 2 capsules (160 mg total) by mouth 2 (two) times daily.     Allergies:   Patient has no known allergies.   Social History   Socioeconomic History   Marital status: Married    Spouse name: Not on file   Number of children: Not on file   Years of education: 12   Highest education level: High school graduate  Occupational History   Occupation: retired  Tobacco Use   Smoking status: Never   Smokeless tobacco: Never   Tobacco comments:    Never smoke 04/19/22  Vaping Use   Vaping Use: Never used  Substance and Sexual Activity   Alcohol use: No    Alcohol/week: 0.0 standard drinks of alcohol   Drug use: No   Sexual activity: Not Currently  Other Topics Concern   Not on file  Social History Narrative   Helps take care of grandchildren    Social Determinants of Health   Financial Resource Strain: Low Risk  (  03/02/2022)   Overall Financial Resource Strain (CARDIA)    Difficulty of Paying Living Expenses: Not hard at all  Food Insecurity: No Food Insecurity (03/02/2022)   Hunger Vital Sign    Worried About Running Out of Food in the Last Year: Never true    Ran Out of Food in the Last Year: Never true  Transportation Needs: No Transportation Needs (02/27/2021)   PRAPARE - Hydrologist (Medical): No    Lack of Transportation (Non-Medical): No  Physical Activity: Inactive (03/02/2022)   Exercise Vital Sign    Days of Exercise per Week: 0 days    Minutes of Exercise per Session: 0 min  Stress: No Stress Concern Present (03/02/2022)   [place]    Feeling of Stress : Not at all  Social Connections: [place] (03/02/2022)   Social Connection and Isolation Panel [NHANES]    Frequency of Communication with Friends and Family: More than three times a week    Frequency of Social Gatherings with Friends and Family: Twice a week    Attends Religious Services: More than 4 times per year    Active Member of [employer] or Organizations: Yes    Attends Music therapist: More than 4 times per year    Marital Status: Married     Family History: The patient's family history includes Arthritis in her sister; Cancer in her mother and paternal grandfather; Dementia in her father; Heart disease in her brother; Stroke in her paternal grandmother. There is no history of Breast cancer.  ROS:   Please see the history of present illness.    All other systems reviewed and are negative.  EKGs/Labs/Other Studies Reviewed:    The following studies were reviewed today:   Recent Labs: 07/30/2021: TSH 2.120 01/27/2022: Magnesium 2.0 06/10/2022: ALT 23; BUN 19; Creatinine, Ser 0.89; Hemoglobin 13.2; Platelets 190; Potassium 4.5; Sodium 137  Recent Lipid Panel    Component Value Date/Time   CHOL 156 04/14/2022 1010   TRIG 113 04/14/2022 1010   HDL 56 04/14/2022 1010   LDLCALC 80 04/14/2022 1010    Physical Exam:    VS:  BP (!) 144/80   Pulse 68   Ht '5\' 6"'$  (1.676 m)   Wt 257 lb (116.6 kg)   BMI 41.48 kg/m     Wt Readings from Last 3 Encounters:  06/23/22 257 lb (116.6 kg)  06/10/22 253 lb 11.2 oz (115.1 kg)  05/17/22 255 lb (115.7 kg)     GEN:  Well nourished, well developed in no acute distress CARDIAC: RRR, no murmurs, rubs, gallops RESPIRATORY:  Clear to auscultation without rales, wheezing or rhonchi  PSYCHIATRIC:  Normal affect        ASSESSMENT:    1. Persistent atrial fibrillation ([place])   2. Hypercoagulable state due to paroxysmal atrial fibrillation  (HCC)   3. Morbid obesity ([place])    PLAN:    In order of problems listed above:  #Persistent atrial fibrillation and flutter Doing well after her catheter ablation.  Continue Eliquis for stroke prophylaxis given an elevated CHA2DS2-VASc.   #Obesity    Medication Adjustments/Labs and Tests Ordered: Current medicines are reviewed at length with the patient today.  Concerns regarding medicines are outlined above.  No orders of the defined types were placed in this encounter.  No orders of the defined types were placed in this encounter.    Signed, Lars Mage, MD, [facility],  [facility] 06/23/2022 11:41 AM    Electrophysiology [facility]

## 2022-06-23 ENCOUNTER — Encounter: Payer: Self-pay | Admitting: Cardiology

## 2022-06-23 ENCOUNTER — Ambulatory Visit: Payer: Medicare HMO | Attending: Cardiology | Admitting: Cardiology

## 2022-06-23 VITALS — BP 144/80 | HR 68 | Ht 66.0 in | Wt 257.0 lb

## 2022-06-23 DIAGNOSIS — D6869 Other thrombophilia: Secondary | ICD-10-CM

## 2022-06-23 DIAGNOSIS — I4819 Other persistent atrial fibrillation: Secondary | ICD-10-CM

## 2022-06-23 DIAGNOSIS — I480 Paroxysmal atrial fibrillation: Secondary | ICD-10-CM | POA: Diagnosis not present

## 2022-06-23 NOTE — Patient Instructions (Signed)
Medication Instructions:   Your physician recommends that you continue on your current medications as directed. Please refer to the Current Medication list given to you today.   *If you need a refill on your cardiac medications before your next appointment, please call your pharmacy*   Lab Work:  None Ordered  If you have labs (blood work) drawn today and your tests are completely normal, you will receive your results only by: [place] (if you have MyChart) OR A paper copy in the mail If you have any lab test that is abnormal or we need to change your treatment, we will call you to review the results.   Testing/Procedures:  None Ordered    Follow-Up: At [facility], you and your health needs are our priority.  As part of our continuing mission to provide you with exceptional heart care, we have created designated Provider Care Teams.  These Care Teams include your primary Cardiologist (physician) and Advanced Practice Providers (APPs -  Physician Assistants and Nurse Practitioners) who all work together to provide you with the care you need, when you need it.  We recommend signing up for the patient portal called "MyChart".  Sign up information is provided on this After Visit Summary.  MyChart is used to connect with patients for Virtual Visits (Telemedicine).  Patients are able to view lab/test results, encounter notes, upcoming appointments, etc.  Non-urgent messages can be sent to your provider as well.   To learn more about what you can do with MyChart, go to [email or web address].    Your next appointment:   12 month(s)  Provider:   Mamie Levers, NP

## 2022-06-27 DIAGNOSIS — I890 Lymphedema, not elsewhere classified: Secondary | ICD-10-CM | POA: Diagnosis not present

## 2022-06-29 ENCOUNTER — Other Ambulatory Visit (HOSPITAL_COMMUNITY): Payer: Self-pay

## 2022-07-05 ENCOUNTER — Other Ambulatory Visit: Payer: Self-pay

## 2022-07-08 ENCOUNTER — Other Ambulatory Visit (HOSPITAL_COMMUNITY): Payer: Self-pay

## 2022-07-08 ENCOUNTER — Encounter: Payer: Self-pay | Admitting: Oncology

## 2022-07-08 ENCOUNTER — Inpatient Hospital Stay: Payer: Medicare HMO | Attending: Oncology

## 2022-07-08 ENCOUNTER — Inpatient Hospital Stay: Payer: Medicare HMO | Admitting: Oncology

## 2022-07-08 VITALS — BP 139/66 | HR 66 | Temp 98.1°F | Resp 18 | Wt 258.0 lb

## 2022-07-08 DIAGNOSIS — Z90710 Acquired absence of both cervix and uterus: Secondary | ICD-10-CM | POA: Diagnosis not present

## 2022-07-08 DIAGNOSIS — Z8249 Family history of ischemic heart disease and other diseases of the circulatory system: Secondary | ICD-10-CM | POA: Diagnosis not present

## 2022-07-08 DIAGNOSIS — G479 Sleep disorder, unspecified: Secondary | ICD-10-CM | POA: Insufficient documentation

## 2022-07-08 DIAGNOSIS — Z801 Family history of malignant neoplasm of trachea, bronchus and lung: Secondary | ICD-10-CM | POA: Insufficient documentation

## 2022-07-08 DIAGNOSIS — Z8041 Family history of malignant neoplasm of ovary: Secondary | ICD-10-CM | POA: Insufficient documentation

## 2022-07-08 DIAGNOSIS — C9110 Chronic lymphocytic leukemia of B-cell type not having achieved remission: Secondary | ICD-10-CM

## 2022-07-08 DIAGNOSIS — I4891 Unspecified atrial fibrillation: Secondary | ICD-10-CM | POA: Insufficient documentation

## 2022-07-08 DIAGNOSIS — G2581 Restless legs syndrome: Secondary | ICD-10-CM

## 2022-07-08 DIAGNOSIS — E785 Hyperlipidemia, unspecified: Secondary | ICD-10-CM | POA: Insufficient documentation

## 2022-07-08 DIAGNOSIS — Z8261 Family history of arthritis: Secondary | ICD-10-CM | POA: Insufficient documentation

## 2022-07-08 DIAGNOSIS — E669 Obesity, unspecified: Secondary | ICD-10-CM | POA: Insufficient documentation

## 2022-07-08 DIAGNOSIS — Z79899 Other long term (current) drug therapy: Secondary | ICD-10-CM | POA: Insufficient documentation

## 2022-07-08 DIAGNOSIS — Z823 Family history of stroke: Secondary | ICD-10-CM | POA: Diagnosis not present

## 2022-07-08 DIAGNOSIS — Z7901 Long term (current) use of anticoagulants: Secondary | ICD-10-CM | POA: Diagnosis not present

## 2022-07-08 DIAGNOSIS — Z5111 Encounter for antineoplastic chemotherapy: Secondary | ICD-10-CM

## 2022-07-08 DIAGNOSIS — Z818 Family history of other mental and behavioral disorders: Secondary | ICD-10-CM | POA: Diagnosis not present

## 2022-07-08 LAB — COMPREHENSIVE METABOLIC PANEL
ALT: 29 U/L (ref 0–44)
AST: 29 U/L (ref 15–41)
Albumin: 4 g/dL (ref 3.5–5.0)
Alkaline Phosphatase: 76 U/L (ref 38–126)
Anion gap: 9 (ref 5–15)
BUN: 19 mg/dL (ref 8–23)
CO2: 28 mmol/L (ref 22–32)
Calcium: 9.1 mg/dL (ref 8.9–10.3)
Chloride: 104 mmol/L (ref 98–111)
Creatinine, Ser: 0.89 mg/dL (ref 0.44–1.00)
GFR, Estimated: >60 mL/min (ref >60)
Glucose, Bld: 100 mg/dL — ABNORMAL HIGH (ref 70–99)
Potassium: 4 mmol/L (ref 3.5–5.1)
Sodium: 141 mmol/L (ref 135–145)
Total Bilirubin: 0.7 mg/dL (ref 0.3–1.2)
Total Protein: 7.2 g/dL (ref 6.5–8.1)

## 2022-07-08 LAB — CBC WITH DIFFERENTIAL/PLATELET
Abs Immature Granulocytes: 0.02 10*3/uL (ref 0.00–0.07)
Basophils Absolute: 0.1 10*3/uL (ref 0.0–0.1)
Basophils Relative: 0 %
Eosinophils Absolute: 0.1 10*3/uL (ref 0.0–0.5)
Eosinophils Relative: 1 %
HCT: 40.1 % (ref 36.0–46.0)
Hemoglobin: 13.4 g/dL (ref 12.0–15.0)
Immature Granulocytes: 0 %
Lymphocytes Relative: 78 %
Lymphs Abs: 17 10*3/uL — ABNORMAL HIGH (ref 0.7–4.0)
MCH: 30.5 pg (ref 26.0–34.0)
MCHC: 33.4 g/dL (ref 30.0–36.0)
MCV: 91.3 fL (ref 80.0–100.0)
Monocytes Absolute: 0.8 10*3/uL (ref 0.1–1.0)
Monocytes Relative: 4 %
Neutro Abs: 3.6 10*3/uL (ref 1.7–7.7)
Neutrophils Relative %: 17 %
Platelets: 198 10*3/uL (ref 150–400)
RBC: 4.39 MIL/uL (ref 3.87–5.11)
RDW: 12.6 % (ref 11.5–15.5)
Smear Review: NORMAL
WBC: 21.5 10*3/uL — ABNORMAL HIGH (ref 4.0–10.5)
nRBC: 0.1 % (ref 0.0–0.2)

## 2022-07-08 MED ORDER — BRUKINSA 80 MG PO CAPS
160.0000 mg | ORAL_CAPSULE | Freq: Two times a day (BID) | ORAL | 3 refills | Status: DC
  Filled 2022-07-08 – 2022-08-05 (×2): qty 120, 30d supply, fill #0
  Filled 2022-09-07: qty 120, 30d supply, fill #1
  Filled 2022-10-05: qty 120, 30d supply, fill #2

## 2022-07-08 NOTE — Progress Notes (Signed)
Hematology/Oncology Consult Note [phone] [phone]    CHIEF COMPLAINTS/PURPOSE OF CONSULTATION:  CLL  ASSESSMENT & PLAN  Cancer Staging  CLL (chronic lymphocytic leukemia) (HCC) Staging form: Chronic Lymphocytic Leukemia / Small Lymphocytic Lymphoma, AJCC 8th Edition - Clinical: Modified Rai Stage 0 (Modified Rai risk: Low, Lymphocytosis: Present, Adenopathy: Absent, Organomegaly: Absent, Anemia: Absent, Thrombocytopenia: Absent) - Signed by Earlie Server, MD on 04/01/2022   CLL (chronic lymphocytic leukemia) ([place]) CLL +TP53 mutation, + constitutional symptoms [night sweat daily]. Rapid lymphocyte increment, 50% over 2 months period She is on Elqiuis, will watch for possible increase risk of bleeding.  Labs are reviewed and discussed with patient. Continue Zanubrutinib '160mg'$  BID, clinically she tolerates well.    Encounter for antineoplastic chemotherapy Continue Zanubrutinib '160mg'$  BID  RLS (restless legs syndrome) Iron panel showed adequate iron store    Follow up 3 months  Earlie Server, MD, PhD [facility] Hematology Oncology 07/08/2022       HISTORY OF PRESENTING ILLNESS:  Erika Anderson [facility] 72 y.o. female presents to follow up with CLL.  She was found to have abnormal CBC on 03/01/22, total white count of 15, predominantly lymphocytosis. This is chronic since Feb 2023. Peripheral blood smear + smudge cells.  + Restless leg syndrome, sleep disturbance for 1 year.  + Family history of ovarian cancer -mother.    Oncology History  CLL (chronic lymphocytic leukemia) ([place])  03/02/2022 Initial Diagnosis   CLL (chronic lymphocytic leukemia)  -03/15/22 Flowcytometry showed chronic lymphocytic leukemia, B cell, CD38 expression indeterminate for prognosis (23%) and CD20 positive.  45% nuclei positive for TP53 gene deletion.    03/26/2022 Imaging   US abdomen 1. No sonographic etiology for night sweats identified. 2. Spleen is at the upper limits of  normal in size    04/01/2022 Cancer Staging   Staging form: Chronic Lymphocytic Leukemia / Small Lymphocytic Lymphoma, AJCC 8th Edition - Clinical: Modified Rai Stage 0 (Modified Rai risk: Low, Lymphocytosis: Present, Adenopathy: Absent, Organomegaly: Absent, Anemia: Absent, Thrombocytopenia: Absent) - Signed by Earlie Server, MD on 04/01/2022 Stage prefix: Initial diagnosis Absolute lymphocyte count (ALC) (cells/uL): 18   04/19/2022 -  Chemotherapy   Started on Zanubrutinib '160mg'$  BID      She reports tolerating Zanubrutinib '160mg'$  BID well. Night sweat has decreased, now once every 2 weeks.  No other new complaints.    MEDICAL HISTORY:  Past Medical History:  Diagnosis Date   Atrial fibrillation (HCC)    Hyperlipidemia    Leukemia ([place])    Obesity (BMI 30-39.9)    Restless legs syndrome (RLS) 12/28/2014   RLS (restless legs syndrome)     SURGICAL HISTORY: Past Surgical History:  Procedure Laterality Date   ATRIAL FIBRILLATION ABLATION N/A 03/22/2022   Procedure: ATRIAL FIBRILLATION ABLATION;  Surgeon: Vickie Epley, MD;  Location: [place] [facility];  Service: Cardiovascular;  Laterality: N/A;   COLONOSCOPY WITH PROPOFOL N/A 08/24/2021   Procedure: COLONOSCOPY WITH PROPOFOL;  Surgeon: Jonathon Bellows, MD;  Location: [facility] ENDOSCOPY;  Service: Gastroenterology;  Laterality: N/A;   HEMORROIDECTOMY     TOTAL ABDOMINAL HYSTERECTOMY     ovaries remain    SOCIAL HISTORY: Social History   Socioeconomic History   Marital status: Married    Spouse name: Not on file   Number of children: Not on file   Years of education: 12   Highest education level: High school graduate  Occupational History   Occupation: retired  Tobacco Use   Smoking status: Never  Smokeless tobacco: Never   Tobacco comments:    Never smoke 04/19/22  Vaping Use   Vaping Use: Never used  Substance and Sexual Activity   Alcohol use: No    Alcohol/week: 0.0 standard drinks of alcohol   Drug use: No    Sexual activity: Not Currently  Other Topics Concern   Not on file  Social History Narrative   Helps take care of grandchildren    Social Determinants of Health   Financial Resource Strain: Low Risk  (03/02/2022)   Overall Financial Resource Strain (CARDIA)    Difficulty of Paying Living Expenses: Not hard at all  Food Insecurity: No Food Insecurity (03/02/2022)   Hunger Vital Sign    Worried About Running Out of Food in the Last Year: Never true    Ran Out of Food in the Last Year: Never true  Transportation Needs: No Transportation Needs (02/27/2021)   PRAPARE - Hydrologist (Medical): No    Lack of Transportation (Non-Medical): No  Physical Activity: Inactive (03/02/2022)   Exercise Vital Sign    Days of Exercise per Week: 0 days    Minutes of Exercise per Session: 0 min  Stress: No Stress Concern Present (03/02/2022)   [place]    Feeling of Stress : Not at all  Social Connections: [place] (03/02/2022)   Social Connection and Isolation Panel [NHANES]    Frequency of Communication with Friends and Family: More than three times a week    Frequency of Social Gatherings with Friends and Family: Twice a week    Attends Religious Services: More than 4 times per year    Active Member of [employer] or Organizations: Yes    Attends Music therapist: More than 4 times per year    Marital Status: Married  Human resources officer Violence: Not At Risk (03/02/2022)   Humiliation, Afraid, Rape, and Kick questionnaire    Fear of Current or Ex-Partner: No    Emotionally Abused: No    Physically Abused: No    Sexually Abused: No    FAMILY HISTORY: Family History  Problem Relation Age of Onset   Cancer Mother        ovarian   Dementia Father    Arthritis Sister    Heart disease Brother        MI   Stroke Paternal Grandmother    Cancer Paternal Grandfather        lung   Breast  cancer Neg Hx     ALLERGIES:  has No Known Allergies.  MEDICATIONS:  Current Outpatient Medications  Medication Sig Dispense Refill   citalopram (CELEXA) 20 MG tablet Take 1 tablet (20 mg total) by mouth daily. 90 tablet 4   ELIQUIS 5 MG TABS tablet Take 1 tablet by mouth twice daily 180 tablet 1   gabapentin (NEURONTIN) 300 MG capsule Take 2 capsules (600 mg total) by mouth at bedtime. (Patient taking differently: Take 300 mg by mouth at bedtime as needed (pain).) 180 capsule 4   metoprolol tartrate (LOPRESSOR) 25 MG tablet Take 25 mg by mouth 2 (two) times daily.     Multiple Vitamins-Minerals (MULTIVITAMIN WITH MINERALS) tablet Take 1 tablet by mouth daily.     pantoprazole (PROTONIX) 40 MG tablet Take 1 tablet (40 mg total) by mouth daily. 90 tablet 3   rOPINIRole (REQUIP) 1 MG tablet Take 1 tablet (1 mg total) by mouth 4 (four) times daily.  360 tablet 4   rosuvastatin (CRESTOR) 10 MG tablet Take 1 tablet (10 mg total) by mouth daily. 90 tablet 3   traZODone (DESYREL) 50 MG tablet Take 0.5 tablets by mouth at bedtime.     zanubrutinib (BRUKINSA) 80 MG capsule Take 2 capsules (160 mg total) by mouth 2 (two) times daily. 120 capsule 3   No current facility-administered medications for this visit.    Review of Systems  Constitutional:  Negative for appetite change, chills, fatigue and fever.  HENT:   Negative for hearing loss and voice change.   Eyes:  Negative for eye problems.  Respiratory:  Negative for chest tightness and cough.   Cardiovascular:  Negative for chest pain.  Gastrointestinal:  Negative for abdominal distention, abdominal pain and blood in stool.  Endocrine: Negative for hot flashes.       Night sweat  Genitourinary:  Negative for difficulty urinating and frequency.   Musculoskeletal:  Negative for arthralgias.  Skin:  Negative for itching and rash.  Neurological:  Negative for extremity weakness.  Hematological:  Negative for adenopathy.   Psychiatric/Behavioral:  Positive for sleep disturbance. Negative for confusion.      PHYSICAL EXAMINATION: ECOG PERFORMANCE STATUS: 1 - Symptomatic but completely ambulatory  Vitals:   07/08/22 1023  BP: 139/66  Pulse: 66  Resp: 18  Temp: 98.1 F (36.7 C)   Filed Weights   07/08/22 1023  Weight: 258 lb (117 kg)    Physical Exam HENT:     Head: Normocephalic.     Nose: Nose normal.     Mouth/Throat:     Pharynx: No oropharyngeal exudate.  Eyes:     General: No scleral icterus.    Pupils: Pupils are equal, round, and reactive to light.  Cardiovascular:     Rate and Rhythm: Normal rate.  Pulmonary:     Effort: Pulmonary effort is normal. No respiratory distress.     Breath sounds: No wheezing.  Abdominal:     General: There is no distension.     Palpations: Abdomen is soft.  Musculoskeletal:        General: Normal range of motion.     Cervical back: Normal range of motion.  Skin:    General: Skin is warm and dry.     Findings: No erythema.  Neurological:     Mental Status: She is alert and oriented to person, place, and time. Mental status is at baseline.     Cranial Nerves: No cranial nerve deficit.     Motor: No abnormal muscle tone.  Psychiatric:        Mood and Affect: Affect normal.     LABORATORY DATA:  I have reviewed the data as listed    Latest Ref Rng & Units 07/08/2022   10:04 AM 06/10/2022    2:14 PM 05/03/2022   10:54 AM  CBC  WBC 4.0 - 10.5 K/uL 21.5  27.3  25.9   Hemoglobin 12.0 - 15.0 g/dL 13.4  13.2  13.5   Hematocrit 36.0 - 46.0 % 40.1  39.4  41.2   Platelets 150 - 400 K/uL 198  190  187       Latest Ref Rng & Units 07/08/2022   10:04 AM 06/10/2022    2:14 PM 05/03/2022   10:54 AM  CMP  Glucose 70 - 99 mg/dL 100  97  103   BUN 8 - 23 mg/dL '19  19  17   '$ Creatinine 0.44 - 1.00 mg/dL 0.89  0.89  0.71   Sodium 135 - 145 mmol/L 141  137  133   Potassium 3.5 - 5.1 mmol/L 4.0  4.5  4.5   Chloride 98 - 111 mmol/L 104  104  100   CO2 22 - 32  mmol/L '28  25  28   '$ Calcium 8.9 - 10.3 mg/dL 9.1  8.9  9.0   Total Protein 6.5 - 8.1 g/dL 7.2  7.2  7.7   Total Bilirubin 0.3 - 1.2 mg/dL 0.7  0.6  0.5   Alkaline Phos 38 - 126 U/L 76  76  85   AST 15 - 41 U/L '29  23  27   '$ ALT 0 - 44 U/L '29  23  28    '$ Lab Results  Component Value Date   LDH 192 03/15/2022    RADIOGRAPHIC STUDIES: I have personally reviewed the radiological images as listed and agreed with the findings in the report. No results found.

## 2022-07-08 NOTE — Assessment & Plan Note (Signed)
Continue Zanubrutinib '160mg'$  BID

## 2022-07-08 NOTE — Assessment & Plan Note (Signed)
Iron panel showed adequate iron store

## 2022-07-08 NOTE — Assessment & Plan Note (Addendum)
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 Rapid lymphocyte increment, 50% over 2 months period She is on Elqiuis, will watch for possible increase risk of bleeding.  Labs are reviewed and discussed with patient. Continue Zanubrutinib '160mg'$  BID, clinically she tolerates well.

## 2022-07-11 NOTE — Patient Instructions (Signed)
Chronic Lymphocytic Leukemia  Chronic lymphocytic leukemia (CLL) is a type of cancer of the blood cells and soft tissue inside of bones (bone marrow). Chronic means that the leukemia develops and grows more slowly than other kinds of leukemia. Lymphocytic refers to the type of blood stem cells (lymphoid cells) that are affected by the cancer. Normally, the bone marrow makes blood stem cells that develop over time into mature cells. These include white blood cells that help fight infection, red blood cells that carry oxygen, and platelets that help stop bleeding. CLL happens when the bone marrow makes too many abnormal versions of certain types of white blood cells called lymphocytes. These cells (leukemia cells) do not function normally, and they build up in the blood. In time, they crowd out healthy blood cells. This affects the body's ability to do the functions carried out by those cells. CLL usually gets worse slowly. It can cause problems in organs, such as the spleen. In addition to weakening your body's defense system (immune system), it may also lead to conditions in which your immune system attacks your body (autoimmune conditions). What are the causes? In many cases, the cause of this condition is not known. In some cases, damage to bone marrow can cause the marrow to form the abnormal leukemia cells. This damage results from external factors and is not related to the genes. What increases the risk? You are more likely to develop this condition if: You are older than 71 years of age. You are female. You have a family history of CLL or other cancers of the lymph system. You have been exposed to certain chemicals, such as: Insecticides. Herbicides. What are the signs or symptoms? At first, there may be no symptoms. After a while, symptoms may include: Feeling more tired than usual, even after rest. Unplanned weight loss. Fever. Heavy sweating at night. Painless, swollen lymph nodes. A  feeling of fullness in the upper left part of the abdomen. Point-shaped dark spots under the skin (petechiae), or easy bleeding from small cuts, nosebleeds, or bruising. Frequent infections. How is this diagnosed? This condition is diagnosed based on: Your symptoms and medical history. A physical exam. Complete blood count with differential. This blood test measures the characteristics of your white blood cells, red blood cells, and platelets. Bone marrow aspiration and biopsy. These involve removing samples of bone marrow for testing and may be done to help determine treatment. Other tests on your blood or bone marrow to: Check for problems or defects in genes and chromosomes that may indicate the disease. Check for cells or defects to help determine the best treatment. A CT scan to check for swelling or anything abnormal in your spleen, liver, and lymph nodes. How is this treated? Treatment for this condition depends on the stage of the leukemia and whether you have symptoms. Treatment may include: Observation. Chemotherapy. These are medicines that kill leukemia cells. Targeted therapy. This is the use of medicines that attack specific leukemia cells without harming normal cells. Surgery to remove the spleen. Immunotherapy. This uses medicines to help your immune system destroy cancer cells. Bone marrow or peripheral blood stem cell transplant. This treatment replaces your own bone marrow or stem cells with bone marrow or stem cells from a donor. Taking part in clinical trials to find out if new treatments are effective. Additional medicines may be needed to help manage symptoms and side effects of treatment. Follow these instructions at home: Medicines Take over-the-counter and prescription medicines only as   told by your health care provider. If you were prescribed antibiotics, take them as told by your health care provider. Do not stop taking the antibiotic even if you start to feel  better. If you are on chemotherapy: Wash your hands often with soap and water for at least 20 seconds, especially before meals, after being outside, and after using the toilet. Have visitors do the same. Keep your teeth and gums clean and well cared for. Brush regularly. Use soft toothbrushes. Protect your skin from the sun by using sunscreen and wearing protective clothing. Make sure that you and your family members get a flu shot (influenza vaccine) every year. General instructions Avoid contact sports or other rough activities. Ask your health care provider what activities are safe for you. Avoid crowded places and people who are sick. Tell your cancer care team if you develop side effects to treatments. They may be able to recommend ways to relieve them. Try to eat regular, healthy meals. Some of your treatments might affect your appetite. Find healthy ways of coping with stress, such as by doing yoga or meditation or by joining a support group. Keep all follow-up visits. Your health care provider will want to monitor your condition and your treatment. Where to find more information American Cancer Society: cancer.org National Cancer Institute: cancer.gov Contact a health care provider if: You have pain in your abdomen. You have painful or more swollen lymph nodes. You vomit every time you eat or drink. You have bleeding from your gums or nose or you have any excessive bleeding. You see blood in your urine or stool (feces). You feel light-headed or you faint. You have a temperature of 100.4F (38C) or higher or you have chills. Get help right away if: You develop chest pain. You have trouble breathing or feel short of breath. These symptoms may be an emergency. Get help right away. Call 911. Do not wait to see if the symptoms will go away. Do not drive yourself to the hospital. This information is not intended to replace advice given to you by your health care provider. Make sure  you discuss any questions you have with your health care provider. Document Revised: 10/05/2021 Document Reviewed: 10/05/2021 Elsevier Patient Education  2023 [employer] Inc.  

## 2022-07-15 ENCOUNTER — Ambulatory Visit (INDEPENDENT_AMBULATORY_CARE_PROVIDER_SITE_OTHER): Payer: Medicare HMO | Admitting: Nurse Practitioner

## 2022-07-15 ENCOUNTER — Encounter: Payer: Self-pay | Admitting: Nurse Practitioner

## 2022-07-15 VITALS — BP 125/75 | HR 62 | Temp 97.6°F | Ht 65.98 in | Wt 260.9 lb

## 2022-07-15 DIAGNOSIS — I480 Paroxysmal atrial fibrillation: Secondary | ICD-10-CM

## 2022-07-15 DIAGNOSIS — Z6841 Body Mass Index (BMI) 40.0 and over, adult: Secondary | ICD-10-CM

## 2022-07-15 DIAGNOSIS — E559 Vitamin D deficiency, unspecified: Secondary | ICD-10-CM | POA: Diagnosis not present

## 2022-07-15 DIAGNOSIS — I4819 Other persistent atrial fibrillation: Secondary | ICD-10-CM | POA: Diagnosis not present

## 2022-07-15 DIAGNOSIS — D6869 Other thrombophilia: Secondary | ICD-10-CM | POA: Diagnosis not present

## 2022-07-15 DIAGNOSIS — F5104 Psychophysiologic insomnia: Secondary | ICD-10-CM

## 2022-07-15 DIAGNOSIS — E782 Mixed hyperlipidemia: Secondary | ICD-10-CM

## 2022-07-15 DIAGNOSIS — G2581 Restless legs syndrome: Secondary | ICD-10-CM

## 2022-07-15 DIAGNOSIS — C9110 Chronic lymphocytic leukemia of B-cell type not having achieved remission: Secondary | ICD-10-CM

## 2022-07-15 DIAGNOSIS — I700 Atherosclerosis of aorta: Secondary | ICD-10-CM

## 2022-07-15 NOTE — Assessment & Plan Note (Signed)
Ongoing, stable.  Ablation on 03/22/22 and 05/22/22.  Symptoms improved.  Continue current medication regimen and adjust as needed.  Continue to collaborate with cardiology, recent notes reviewed.  All questions answered.  Labs today: CMP and Lipid.

## 2022-07-15 NOTE — Assessment & Plan Note (Signed)
Chronic, ongoing. Continue current medication regimen and adjust as needed.  Lipid panel today. 

## 2022-07-15 NOTE — Assessment & Plan Note (Signed)
Recommended eating smaller high protein, low fat meals more frequently and exercising 30 mins a day 5 times a week with a goal of 10-15lb weight loss in the next 3 months. Patient voiced their understanding and motivation to adhere to these recommendations.  

## 2022-07-15 NOTE — Assessment & Plan Note (Signed)
Ongoing.  Noted on imaging 05/03/18.  Continue statin for prevention and monitor closely.

## 2022-07-15 NOTE — Progress Notes (Signed)
BP 125/75   Pulse 62   Temp 97.6 F (36.4 C) (Oral)   Ht 5' 5.98" (1.676 m)   Wt 260 lb 14.4 oz (118.3 kg)   SpO2 94%   BMI 42.13 kg/m    Subjective:    Patient ID: Erika Anderson, female    DOB: Dec 07, 1950, 72 y.o.   MRN: [id number]  HPI: Erika Anderson is a 72 y.o. female  Chief Complaint  Patient presents with   CLL   Paroxysmaly a fib   Hypertension   RLS   ATRIAL FIBRILLATION & CLL Currently on Metoprolol and Eliquis.  Last visit with cardiology was on 06/23/22.  Ablation performed on 03/22/22 and 05/22/22.    CLL diagnosed on 04/01/22 with elevations noted on CBC.  Is followed by Dr. Tasia Catchings.  Last oncology visit was 07/08/22.  She reports night sweats still present, but not as often.  Continues on Brukinsa for treatment.   Atrial fibrillation status: stable Satisfied with current treatment: yes  Medication side effects:  no Medication compliance: good compliance Etiology of atrial fibrillation: unknown Palpitations: no Chest pain: no Dyspnea on exertion: is improving, but still occasionally present Orthopnea: no Syncope:  no Edema: occasional Ventricular rate control: B-blocker Anti-coagulation: long acting   HYPERLIPIDEMIA Continues on Rosuvastatin. Satisfied with current treatment? yes Duration of hyperlipidemia: chronic Cholesterol medication side effects: no Cholesterol supplements: none Medication compliance: good compliance Aspirin: no Recent stressors: no Recurrent headaches: no Visual changes: no Palpitations: no Dyspnea: is improving, but still occasionally present Chest pain: no Lower extremity edema: yes, occasional Dizzy/lightheaded: no   GERD Taking Protonix daily. GERD control status: stable Satisfied with current treatment? yes Heartburn frequency: occasional, uses chewable Rolaids when present Medication side effects: no  Medication compliance: stable Dysphagia: no Odynophagia:  no Hematemesis: no Blood in stool: no EGD:  yes   RESTLESS LEGS Continues on Requip 1 MG QID (on max dose).  Taking Gabapentin 300 MG at night for back pain PRN.  Had visit with pulmonary on 02/26/22 --  no sleep apnea on testing, she stopped Trazodone -- only took minimally.  They recommended using a sleep aid.    Saw vascular in past for varicose veins, uses compression pumps which help. Duration: chronic Discomfort description:  ill-defined Pain: no Location: lower legs Bilateral: yes Symmetric: yes Severity: mild Onset:  gradual Frequency:  intermittent Symptoms only occur while legs at rest: yes Sudden unintentional leg jerking: yes Bed partner bothered by leg movements: no LE numbness: no Decreased sensation: no Weakness: no Insomnia: yes Daytime somnolence: no Fatigue: no Alleviating factors: Requip Aggravating factors: rest Status: stable Treatments attempted: as above noted     07/15/2022   10:14 AM 04/14/2022    9:25 AM 03/02/2022    8:41 AM 01/27/2022    8:42 AM 12/07/2021    9:18 AM  Depression screen PHQ 2/9  Decreased Interest 0 0 0 0 0  Down, Depressed, Hopeless 0 0 0 0 0  PHQ - 2 Score 0 0 0 0 0  Altered sleeping 0 '1 2 3 3  '$ Tired, decreased energy 0 '3 2 2 2  '$ Change in appetite 0 0 0 1   Feeling bad or failure about yourself  0 0 0 0 0  Trouble concentrating 0 0 0 0 0  Moving slowly or fidgety/restless 0 0 0 1 0  Suicidal thoughts 0 0 0 0 0  PHQ-9 Score 0 '4 4 7 5  '$ Difficult doing work/chores Not difficult  at all Somewhat difficult Not difficult at all Not difficult at all Somewhat difficult       07/15/2022   10:15 AM 04/14/2022    9:25 AM 01/27/2022    8:42 AM 12/07/2021    9:19 AM  GAD 7 : Generalized Anxiety Score  Nervous, Anxious, on Edge 0 0 0 0  Control/stop worrying 0 0 0 0  Worry too much - different things 0 0 0 1  Trouble relaxing 0 2 0 1  Restless 0 2 0 1  Easily annoyed or irritable 0 0 0 0  Afraid - awful might happen 0 0 0 0  Total GAD 7 Score 0 4 0 3  Anxiety Difficulty Not  difficult at all Not difficult at all Not difficult at all Not difficult at all    Relevant past medical, surgical, family and social history reviewed and updated as indicated. Interim medical history since our last visit reviewed. Allergies and medications reviewed and updated.  Review of Systems  Constitutional:  Negative for activity change, appetite change, diaphoresis, fatigue and fever.  Respiratory:  Positive for shortness of breath (with activity, but improving). Negative for cough, chest tightness and wheezing.   Cardiovascular:  Negative for chest pain, palpitations and leg swelling.  Gastrointestinal: Negative.   Endocrine: Negative.   Neurological: Negative.   Psychiatric/Behavioral: Negative.     Per HPI unless specifically indicated above     Objective:    BP 125/75   Pulse 62   Temp 97.6 F (36.4 C) (Oral)   Ht 5' 5.98" (1.676 m)   Wt 260 lb 14.4 oz (118.3 kg)   SpO2 94%   BMI 42.13 kg/m   Wt Readings from Last 3 Encounters:  07/15/22 260 lb 14.4 oz (118.3 kg)  07/08/22 258 lb (117 kg)  06/23/22 257 lb (116.6 kg)    Physical Exam Vitals and nursing note reviewed.  Constitutional:      General: She is awake. She is not in acute distress.    Appearance: She is well-developed and well-groomed. She is obese. She is not ill-appearing or toxic-appearing.  HENT:     Head: Normocephalic.     Right Ear: Hearing normal.     Left Ear: Hearing normal.  Eyes:     General: Lids are normal.        Right eye: No discharge.        Left eye: No discharge.     Conjunctiva/sclera: Conjunctivae normal.     Pupils: Pupils are equal, round, and reactive to light.  Neck:     Thyroid: No thyromegaly.     Vascular: No carotid bruit.  Cardiovascular:     Rate and Rhythm: Normal rate and regular rhythm.     Heart sounds: Normal heart sounds. No murmur heard.    No gallop. No S3 or S4 sounds.  Pulmonary:     Effort: Pulmonary effort is normal. No accessory muscle usage or  respiratory distress.     Breath sounds: Normal breath sounds.  Abdominal:     General: Bowel sounds are normal. There is no distension.     Palpations: Abdomen is soft.     Tenderness: There is no abdominal tenderness.  Musculoskeletal:     Cervical back: Normal range of motion and neck supple.     Right lower leg: Edema (trace) present.     Left lower leg: Edema (trace) present.  Lymphadenopathy:     Cervical: No cervical adenopathy.  Skin:  General: Skin is warm and dry.  Neurological:     Mental Status: She is alert and oriented to person, place, and time.  Psychiatric:        Attention and Perception: Attention normal.        Mood and Affect: Mood normal.        Speech: Speech normal.        Behavior: Behavior normal. Behavior is cooperative.        Thought Content: Thought content normal.    Results for orders placed or performed in visit on 07/08/22  Comprehensive metabolic panel  Result Value Ref Range   Sodium 141 135 - 145 mmol/L   Potassium 4.0 3.5 - 5.1 mmol/L   Chloride 104 98 - 111 mmol/L   CO2 28 22 - 32 mmol/L   Glucose, Bld 100 (H) 70 - 99 mg/dL   BUN 19 8 - 23 mg/dL   Creatinine, Ser 0.89 0.44 - 1.00 mg/dL   Calcium 9.1 8.9 - 10.3 mg/dL   Total Protein 7.2 6.5 - 8.1 g/dL   Albumin 4.0 3.5 - 5.0 g/dL   AST 29 15 - 41 U/L   ALT 29 0 - 44 U/L   Alkaline Phosphatase 76 38 - 126 U/L   Total Bilirubin 0.7 0.3 - 1.2 mg/dL   GFR, Estimated >60 >60 mL/min   Anion gap 9 5 - 15  CBC with Differential/Platelet  Result Value Ref Range   WBC 21.5 (H) 4.0 - 10.5 K/uL   RBC 4.39 3.87 - 5.11 MIL/uL   Hemoglobin 13.4 12.0 - 15.0 g/dL   HCT 40.1 36.0 - 46.0 %   MCV 91.3 80.0 - 100.0 fL   MCH 30.5 26.0 - 34.0 pg   MCHC 33.4 30.0 - 36.0 g/dL   RDW 12.6 11.5 - 15.5 %   Platelets 198 150 - 400 K/uL   nRBC 0.1 0.0 - 0.2 %   Neutrophils Relative % 17 %   Neutro Abs 3.6 1.7 - 7.7 K/uL   Lymphocytes Relative 78 %   Lymphs Abs 17.0 (H) 0.7 - 4.0 K/uL   Monocytes  Relative 4 %   Monocytes Absolute 0.8 0.1 - 1.0 K/uL   Eosinophils Relative 1 %   Eosinophils Absolute 0.1 0.0 - 0.5 K/uL   Basophils Relative 0 %   Basophils Absolute 0.1 0.0 - 0.1 K/uL   WBC Morphology ABSOLUTE LYMPHOCYTOSIS    RBC Morphology UNREMARKABLE    Smear Review Normal platelet morphology    Immature Granulocytes 0 %   Abs Immature Granulocytes 0.02 0.00 - 0.07 K/uL      Assessment & Plan:   Problem List Items Addressed This Visit       Cardiovascular and Mediastinum   Aortic atherosclerosis (HCC) (Chronic)    Ongoing.  Noted on imaging 05/03/18.  Continue statin for prevention and monitor closely.      Atrial fibrillation (HCC) (Chronic)    Ongoing, stable.  Ablation on 03/22/22 and 05/22/22.  Symptoms improved.  Continue current medication regimen and adjust as needed.  Continue to collaborate with cardiology, recent notes reviewed.  All questions answered.  Labs today: CMP and Lipid.        Relevant Orders   Magnesium   Hypercoagulable state due to paroxysmal atrial fibrillation (HCC)    Chronic, ongoing, on Eliquis.  Continue to monitor.  CBC up to date.      Relevant Orders   Magnesium     Other   BMI  40.0-44.9, adult (HCC) (Chronic)    Recommended eating smaller high protein, low fat meals more frequently and exercising 30 mins a day 5 times a week with a goal of 10-15lb weight loss in the next 3 months. Patient voiced their understanding and motivation to adhere to these recommendations.      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  Continue collaboration with oncology, recent notes reviewed.        Hyperlipidemia (Chronic)    Chronic, ongoing.  Continue current medication regimen and adjust as needed.  Lipid panel today.      Relevant Orders   Comprehensive metabolic panel   Lipid Panel w/o Chol/HDL Ratio   Morbid obesity (HCC) (Chronic)    BMI 42.13.  Recommended eating smaller high protein, low  fat meals more frequently and exercising 30 mins a day 5 times a week with a goal of 10-15lb weight loss in the next 3 months. Patient voiced their understanding and motivation to adhere to these recommendations.       RLS (restless legs syndrome) (Chronic)    Chronic and stable with Requip and Gabapentin, continue this regimen and adjust as needed.  Monitor kidney function closely with medications.  Labs today: CMP and Mag..      Vitamin D deficiency (Chronic)    Chronic.  Noted on past labs, recommend taking Vitamin D3 2000 units daily, check level today.      Relevant Orders   VITAMIN D 25 Hydroxy (Vit-D Deficiency, Fractures)     Follow up plan: Return in about 6 months (around 01/13/2023) for Annual physical.

## 2022-07-15 NOTE — Assessment & Plan Note (Signed)
Chronic and stable with Requip and Gabapentin, continue this regimen and adjust as needed.  Monitor kidney function closely with medications.  Labs today: CMP and Mag.Marland Kitchen

## 2022-07-15 NOTE — Assessment & Plan Note (Signed)
BMI 42.13.  Recommended eating smaller high protein, low fat meals more frequently and exercising 30 mins a day 5 times a week with a goal of 10-15lb weight loss in the next 3 months. Patient voiced their understanding and motivation to adhere to these recommendations.

## 2022-07-15 NOTE — Assessment & Plan Note (Signed)
Chronic.  Noted on past labs, recommend taking Vitamin D3 2000 units daily, check level today.

## 2022-07-15 NOTE — Assessment & Plan Note (Addendum)
Diagnosed on 04/01/22.  Continue collaboration with oncology, recent notes reviewed.   

## 2022-07-15 NOTE — Assessment & Plan Note (Signed)
Chronic, ongoing, on Eliquis.  Continue to monitor.  CBC up to date.

## 2022-07-16 ENCOUNTER — Other Ambulatory Visit: Payer: Self-pay | Admitting: Nurse Practitioner

## 2022-07-16 LAB — COMPREHENSIVE METABOLIC PANEL
ALT: 19 IU/L (ref 0–32)
AST: 17 IU/L (ref 0–40)
Albumin/Globulin Ratio: 1.9 (ref 1.2–2.2)
Albumin: 4.3 g/dL (ref 3.9–4.9)
Alkaline Phosphatase: 99 IU/L (ref 44–121)
BUN/Creatinine Ratio: 16 (ref 12–28)
BUN: 13 mg/dL (ref 8–27)
Bilirubin Total: 0.4 mg/dL (ref 0.0–1.2)
CO2: 26 mmol/L (ref 20–29)
Calcium: 9.4 mg/dL (ref 8.7–10.3)
Chloride: 104 mmol/L (ref 96–106)
Creatinine, Ser: 0.8 mg/dL (ref 0.57–1.00)
Globulin, Total: 2.3 g/dL (ref 1.5–4.5)
Glucose: 86 mg/dL (ref 70–99)
Potassium: 3.7 mmol/L (ref 3.5–5.2)
Sodium: 144 mmol/L (ref 134–144)
Total Protein: 6.6 g/dL (ref 6.0–8.5)
eGFR: 79 mL/min/{1.73_m2} (ref >59)

## 2022-07-16 LAB — LIPID PANEL W/O CHOL/HDL RATIO
Cholesterol, Total: 180 mg/dL (ref 100–199)
HDL: 61 mg/dL (ref >39)
LDL Chol Calc (NIH): 91 mg/dL (ref 0–99)
Triglycerides: 163 mg/dL — ABNORMAL HIGH (ref 0–149)
VLDL Cholesterol Cal: 28 mg/dL (ref 5–40)

## 2022-07-16 LAB — VITAMIN D 25 HYDROXY (VIT D DEFICIENCY, FRACTURES): Vit D, 25-Hydroxy: 26.5 ng/mL — ABNORMAL LOW (ref 30.0–100.0)

## 2022-07-16 LAB — MAGNESIUM: Magnesium: 1.9 mg/dL (ref 1.6–2.3)

## 2022-07-16 MED ORDER — ROSUVASTATIN CALCIUM 20 MG PO TABS: 20.0000 mg | ORAL_TABLET | Freq: Every day | ORAL | 4 refills | Status: DC

## 2022-07-16 NOTE — Progress Notes (Signed)
Contacted via [place] morning [facility], your labs have returned: - Cholesterol levels are stable, although would like to see LDL lower <70.  I am going to increase your Rosuvastatin to 20 MG daily, stop the 10 MG tablet please. - Vitamin D remains a little low, please ensure you are taking Vitamin D3 2000 units daily for bone health. - Kidney function, creatinine and eGFR, remains normal, as is liver function, AST and ALT.  Any questions? Keep being amazing!!  Thank you for allowing me to participate in your care.  I appreciate you. Kindest regards, Kingsley Farace

## 2022-07-20 ENCOUNTER — Other Ambulatory Visit (HOSPITAL_COMMUNITY): Payer: Self-pay

## 2022-07-27 DIAGNOSIS — J302 Other seasonal allergic rhinitis: Secondary | ICD-10-CM | POA: Diagnosis not present

## 2022-07-27 DIAGNOSIS — K219 Gastro-esophageal reflux disease without esophagitis: Secondary | ICD-10-CM | POA: Diagnosis not present

## 2022-07-27 DIAGNOSIS — G629 Polyneuropathy, unspecified: Secondary | ICD-10-CM | POA: Diagnosis not present

## 2022-07-27 DIAGNOSIS — R32 Unspecified urinary incontinence: Secondary | ICD-10-CM | POA: Diagnosis not present

## 2022-07-27 DIAGNOSIS — G2581 Restless legs syndrome: Secondary | ICD-10-CM | POA: Diagnosis not present

## 2022-07-27 DIAGNOSIS — I4891 Unspecified atrial fibrillation: Secondary | ICD-10-CM | POA: Diagnosis not present

## 2022-07-27 DIAGNOSIS — E785 Hyperlipidemia, unspecified: Secondary | ICD-10-CM | POA: Diagnosis not present

## 2022-07-27 DIAGNOSIS — M1990 Unspecified osteoarthritis, unspecified site: Secondary | ICD-10-CM | POA: Diagnosis not present

## 2022-07-27 DIAGNOSIS — I471 Supraventricular tachycardia, unspecified: Secondary | ICD-10-CM | POA: Diagnosis not present

## 2022-07-27 DIAGNOSIS — D84821 Immunodeficiency due to drugs: Secondary | ICD-10-CM | POA: Diagnosis not present

## 2022-07-27 DIAGNOSIS — I10 Essential (primary) hypertension: Secondary | ICD-10-CM | POA: Diagnosis not present

## 2022-07-28 DIAGNOSIS — I890 Lymphedema, not elsewhere classified: Secondary | ICD-10-CM | POA: Diagnosis not present

## 2022-07-31 NOTE — Patient Instructions (Incomplete)
Magnesium 400 MG nightly  DASH Eating Plan DASH stands for Dietary Approaches to Stop Hypertension. The DASH eating plan is a healthy eating plan that has been shown to: Reduce high blood pressure (hypertension). Reduce your risk for type 2 diabetes, heart disease, and stroke. Help with weight loss. What are tips for following this plan? Reading food labels Check food labels for the amount of salt (sodium) per serving. Choose foods with less than 5 percent of the Daily Value of sodium. Generally, foods with less than 300 milligrams (mg) of sodium per serving fit into this eating plan. To find whole grains, look for the word "whole" as the first word in the ingredient list. Shopping Buy products labeled as "low-sodium" or "no salt added." Buy fresh foods. Avoid canned foods and pre-made or frozen meals. Cooking Avoid adding salt when cooking. Use salt-free seasonings or herbs instead of table salt or sea salt. Check with your health care provider or pharmacist before using salt substitutes. Do not fry foods. Cook foods using healthy methods such as baking, boiling, grilling, roasting, and broiling instead. Cook with heart-healthy oils, such as olive, canola, avocado, soybean, or sunflower oil. Meal planning  Eat a balanced diet that includes: 4 or more servings of fruits and 4 or more servings of vegetables each day. Try to fill one-half of your plate with fruits and vegetables. 6-8 servings of whole grains each day. Less than 6 oz (170 g) of lean meat, poultry, or fish each day. A 3-oz (85-g) serving of meat is about the same size as a deck of cards. One egg equals 1 oz (28 g). 2-3 servings of low-fat dairy each day. One serving is 1 cup (237 mL). 1 serving of nuts, seeds, or beans 5 times each week. 2-3 servings of heart-healthy fats. Healthy fats called omega-3 fatty acids are found in foods such as walnuts, flaxseeds, fortified milks, and eggs. These fats are also found in cold-water  fish, such as sardines, salmon, and mackerel. Limit how much you eat of: Canned or prepackaged foods. Food that is high in trans fat, such as some fried foods. Food that is high in saturated fat, such as fatty meat. Desserts and other sweets, sugary drinks, and other foods with added sugar. Full-fat dairy products. Do not salt foods before eating. Do not eat more than 4 egg yolks a week. Try to eat at least 2 vegetarian meals a week. Eat more home-cooked food and less restaurant, buffet, and fast food. Lifestyle When eating at a restaurant, ask that your food be prepared with less salt or no salt, if possible. If you drink alcohol: Limit how much you use to: 0-1 drink a day for women who are not pregnant. 0-2 drinks a day for men. Be aware of how much alcohol is in your drink. In the [place] one drink equals one 12 oz bottle of beer (355 mL), one 5 oz glass of wine (148 mL), or one 1 oz glass of hard liquor (44 mL). General information Avoid eating more than 2,300 mg of salt a day. If you have hypertension, you may need to reduce your sodium intake to 1,500 mg a day. Work with your health care provider to maintain a healthy body weight or to lose weight. Ask what an ideal weight is for you. Get at least 30 minutes of exercise that causes your heart to beat faster (aerobic exercise) most days of the week. Activities may include walking, swimming, or biking. Work with your health  care provider or dietitian to adjust your eating plan to your individual calorie needs. What foods should I eat? Fruits All fresh, dried, or frozen fruit. Canned fruit in natural juice (without added sugar). Vegetables Fresh or frozen vegetables (raw, steamed, roasted, or grilled). Low-sodium or reduced-sodium tomato and vegetable juice. Low-sodium or reduced-sodium tomato sauce and tomato paste. Low-sodium or reduced-sodium canned vegetables. Grains Whole-grain or whole-wheat bread. Whole-grain or whole-wheat  pasta. Brown rice. Orpah Cobb. Bulgur. Whole-grain and low-sodium cereals. Pita bread. Low-fat, low-sodium crackers. Whole-wheat flour tortillas. Meats and other proteins Skinless chicken or [place]. Ground chicken or [place]. Pork with fat trimmed off. Fish and seafood. Egg whites. Dried beans, peas, or lentils. Unsalted nuts, nut butters, and seeds. Unsalted canned beans. Lean cuts of beef with fat trimmed off. Low-sodium, lean precooked or cured meat, such as sausages or meat loaves. Dairy Low-fat (1%) or fat-free (skim) milk. Reduced-fat, low-fat, or fat-free cheeses. Nonfat, low-sodium ricotta or cottage cheese. Low-fat or nonfat yogurt. Low-fat, low-sodium cheese. Fats and oils Soft margarine without trans fats. Vegetable oil. Reduced-fat, low-fat, or light mayonnaise and salad dressings (reduced-sodium). Canola, safflower, olive, avocado, soybean, and sunflower oils. Avocado. Seasonings and condiments Herbs. Spices. Seasoning mixes without salt. Other foods Unsalted popcorn and pretzels. Fat-free sweets. The items listed above may not be a complete list of foods and beverages you can eat. Contact a dietitian for more information. What foods should I avoid? Fruits Canned fruit in a light or heavy syrup. Fried fruit. Fruit in cream or butter sauce. Vegetables Creamed or fried vegetables. Vegetables in a cheese sauce. Regular canned vegetables (not low-sodium or reduced-sodium). Regular canned tomato sauce and paste (not low-sodium or reduced-sodium). Regular tomato and vegetable juice (not low-sodium or reduced-sodium). Rosita Fire. Olives. Grains Baked goods made with fat, such as croissants, muffins, or some breads. Dry pasta or rice meal packs. Meats and other proteins Fatty cuts of meat. Ribs. Fried meat. Tomasa Blase. Bologna, salami, and other precooked or cured meats, such as sausages or meat loaves. Fat from the back of a pig (fatback). Bratwurst. Salted nuts and seeds. Canned beans with  added salt. Canned or smoked fish. Whole eggs or egg yolks. Chicken or [place] with skin. Dairy Whole or 2% milk, cream, and half-and-half. Whole or full-fat cream cheese. Whole-fat or sweetened yogurt. Full-fat cheese. Nondairy creamers. Whipped toppings. Processed cheese and cheese spreads. Fats and oils Butter. Stick margarine. Lard. Shortening. Ghee. Bacon fat. Tropical oils, such as coconut, palm kernel, or palm oil. Seasonings and condiments Onion salt, garlic salt, seasoned salt, table salt, and sea salt. Worcestershire sauce. Tartar sauce. Barbecue sauce. Teriyaki sauce. Soy sauce, including reduced-sodium. Steak sauce. Canned and packaged gravies. Fish sauce. Oyster sauce. Cocktail sauce. Store-bought horseradish. Ketchup. Mustard. Meat flavorings and tenderizers. Bouillon cubes. Hot sauces. Pre-made or packaged marinades. Pre-made or packaged taco seasonings. Relishes. Regular salad dressings. Other foods Salted popcorn and pretzels. The items listed above may not be a complete list of foods and beverages you should avoid. Contact a dietitian for more information. Where to find more information National Heart, Lung, and Blood Institute: PopSteam.is American Heart Association: www.heart.org Academy of Nutrition and Dietetics: www.eatright.org National Kidney Foundation: www.kidney.org Summary The DASH eating plan is a healthy eating plan that has been shown to reduce high blood pressure (hypertension). It may also reduce your risk for type 2 diabetes, heart disease, and stroke. When on the DASH eating plan, aim to eat more fresh fruits and vegetables, whole grains, lean proteins, low-fat dairy, and  heart-healthy fats. With the DASH eating plan, you should limit salt (sodium) intake to 2,300 mg a day. If you have hypertension, you may need to reduce your sodium intake to 1,500 mg a day. Work with your health care provider or dietitian to adjust your eating plan to your individual  calorie needs. This information is not intended to replace advice given to you by your health care provider. Make sure you discuss any questions you have with your health care provider. Document Revised: 04/27/2019 Document Reviewed: 04/27/2019 Elsevier Patient Education  2023 [employer].

## 2022-08-02 ENCOUNTER — Other Ambulatory Visit: Payer: Self-pay

## 2022-08-04 ENCOUNTER — Ambulatory Visit (INDEPENDENT_AMBULATORY_CARE_PROVIDER_SITE_OTHER): Payer: Medicare HMO | Admitting: Nurse Practitioner

## 2022-08-04 ENCOUNTER — Encounter: Payer: Self-pay | Admitting: Nurse Practitioner

## 2022-08-04 VITALS — BP 132/71 | HR 60 | Temp 97.6°F | Ht 65.98 in | Wt 260.7 lb

## 2022-08-04 DIAGNOSIS — C9110 Chronic lymphocytic leukemia of B-cell type not having achieved remission: Secondary | ICD-10-CM

## 2022-08-04 DIAGNOSIS — Z0000 Encounter for general adult medical examination without abnormal findings: Secondary | ICD-10-CM

## 2022-08-04 NOTE — Assessment & Plan Note (Signed)
Diagnosed on 04/01/22.  Continue collaboration with oncology, recent notes reviewed.

## 2022-08-04 NOTE — Progress Notes (Signed)
BP 132/71   Pulse 60   Temp 97.6 F (36.4 C) (Oral)   Ht 5' 5.98" (1.676 m)   Wt 260 lb 11.2 oz (118.3 kg)   SpO2 96%   BMI 42.10 kg/m    Subjective:    Patient ID: Erika Anderson, female    DOB: Jul 25, 1950, 72 y.o.   MRN: [id number]  HPI: Erika Anderson is a 72 y.o. female presenting on 08/04/2022 for comprehensive medical examination. Current medical complaints include:none  She currently lives with: husband Menopausal Symptoms: no  Recent lengthy follow-up and all labs recently done 07/15/2022.     08/04/2022    8:13 AM 07/15/2022   10:14 AM 04/14/2022    9:25 AM 03/02/2022    8:41 AM 01/27/2022    8:42 AM  Depression screen PHQ 2/9  Decreased Interest 1 0 0 0 0  Down, Depressed, Hopeless 0 0 0 0 0  PHQ - 2 Score 1 0 0 0 0  Altered sleeping 1 0 '1 2 3  '$ Tired, decreased energy 1 0 '3 2 2  '$ Change in appetite 1 0 0 0 1  Feeling bad or failure about yourself  0 0 0 0 0  Trouble concentrating 0 0 0 0 0  Moving slowly or fidgety/restless 0 0 0 0 1  Suicidal thoughts 0 0 0 0 0  PHQ-9 Score 4 0 '4 4 7  '$ Difficult doing work/chores Not difficult at all Not difficult at all Somewhat difficult Not difficult at all Not difficult at all       08/04/2022    8:13 AM 07/15/2022   10:15 AM 04/14/2022    9:25 AM 01/27/2022    8:42 AM  GAD 7 : Generalized Anxiety Score  Nervous, Anxious, on Edge 0 0 0 0  Control/stop worrying 0 0 0 0  Worry too much - different things 0 0 0 0  Trouble relaxing 0 0 2 0  Restless 0 0 2 0  Easily annoyed or irritable 0 0 0 0  Afraid - awful might happen 0 0 0 0  Total GAD 7 Score 0 0 4 0  Anxiety Difficulty Not difficult at all Not difficult at all Not difficult at all Not difficult at all      03/22/2022    5:39 AM 04/14/2022    9:25 AM 06/10/2022    2:47 PM 07/15/2022   10:14 AM 08/04/2022    8:12 AM  [place] in the past year?  1  0 0  Was there an injury with Fall?  0  0 0  Fall Risk Category Calculator  1  0 0  Fall Risk Category  (Retired)  Low     (RETIRED) Patient Fall Risk Level Low fall risk Low fall risk Low fall risk    Patient at Risk for Falls Due to  History of fall(s)  No Fall Risks No Fall Risks  Fall risk Follow up  Falls evaluation completed  Falls evaluation completed Falls evaluation completed    Functional Status Survey: Is the patient deaf or have difficulty hearing?: No Does the patient have difficulty seeing, even when wearing glasses/contacts?: No Does the patient have difficulty concentrating, remembering, or making decisions?: No Does the patient have difficulty walking or climbing stairs?: No Does the patient have difficulty dressing or bathing?: No Does the patient have difficulty doing errands alone such as visiting a doctor's office or shopping?: No   Past Medical History:  Past  Medical History:  Diagnosis Date   Atrial fibrillation ([place])    Hyperlipidemia    Leukemia ([place])    Obesity (BMI 30-39.9)    Restless legs syndrome (RLS) 12/28/2014   RLS (restless legs syndrome)     Surgical History:  Past Surgical History:  Procedure Laterality Date   ATRIAL FIBRILLATION ABLATION N/A 03/22/2022   Procedure: ATRIAL FIBRILLATION ABLATION;  Surgeon: Vickie Epley, MD;  Location: [facility];  Service: Cardiovascular;  Laterality: N/A;   COLONOSCOPY WITH PROPOFOL N/A 08/24/2021   Procedure: COLONOSCOPY WITH PROPOFOL;  Surgeon: Jonathon Bellows, MD;  Location[facility] ENDOS[facility];  Service: Gastroenterology;  Laterality: N/A;   HEMORROIDECTOMY     TOTAL ABDOMINAL HYSTERECTOMY     ovaries remain    Medications:  Current Outpatient Medications on File Prior to Visit  Medication Sig   citalopram (CELEXA) 20 MG tablet Take 1 tablet (20 mg total) by mouth daily.   ELIQUIS 5 MG TABS tablet Take 1 tablet by mouth twice daily   gabapentin (NEURONTIN) 300 MG capsule Take 2 capsules (600 mg total) by mouth at bedtime. (Patient taking differently: Take 300 mg by mouth at bedtime as needed (pain).)    metoprolol tartrate (LOPRESSOR) 25 MG tablet Take 25 mg by mouth 2 (two) times daily.   Multiple Vitamins-Minerals (MULTIVITAMIN WITH MINERALS) tablet Take 1 tablet by mouth daily.   pantoprazole (PROTONIX) 40 MG tablet Take 1 tablet (40 mg total) by mouth daily.   rOPINIRole (REQUIP) 1 MG tablet Take 1 tablet (1 mg total) by mouth 4 (four) times daily.   rosuvastatin (CRESTOR) 20 MG tablet Take 1 tablet (20 mg total) by mouth daily.   traZODone (DESYREL) 50 MG tablet Take 0.5 tablets by mouth at bedtime.   zanubrutinib (BRUKINSA) 80 MG capsule Take 2 capsules (160 mg total) by mouth 2 (two) times daily.   No current facility-administered medications on file prior to visit.    Allergies:  No Known Allergies  Social History:  Social History   Socioeconomic History   Marital status: Married    Spouse name: Not on file   Number of children: Not on file   Years of education: 12   Highest education level: High school graduate  Occupational History   Occupation: retired  Tobacco Use   Smoking status: Never   Smokeless tobacco: Never   Tobacco comments:    Never smoke 04/19/22  Vaping Use   Vaping Use: Never used  Substance and Sexual Activity   Alcohol use: No    Alcohol/week: 0.0 standard drinks of alcohol   Drug use: No   Sexual activity: Not Currently  Other Topics Concern   Not on file  Social History Narrative   Helps take care of grandchildren    Social Determinants of Health   Financial Resource Strain: Low Risk  (03/02/2022)   Overall Financial Resource Strain (CARDIA)    Difficulty of Paying Living Expenses: Not hard at all  Food Insecurity: No Food Insecurity (03/02/2022)   Hunger Vital Sign    Worried About Running Out of Food in the Last Year: Never true    [place] in the Last Year: Never true  Transportation Needs: No Transportation Needs (02/27/2021)   PRAPARE - Hydrologist (Medical): No    Lack of Transportation  (Non-Medical): No  Physical Activity: Inactive (03/02/2022)   Exercise Vital Sign    Days of Exercise per Week: 0 days  Minutes of Exercise per Session: 0 min  Stress: No Stress Concern Present (03/02/2022)   [place]    Feeling of Stress : Not at all  Social Connections: [place] (03/02/2022)   Social Connection and Isolation Panel [NHANES]    Frequency of Communication with Friends and Family: More than three times a week    Frequency of Social Gatherings with Friends and Family: Twice a week    Attends Religious Services: More than 4 times per year    Active Member of [employer] or Organizations: Yes    Attends Music therapist: More than 4 times per year    Marital Status: Married  Human resources officer Violence: Not At Risk (03/02/2022)   Humiliation, Afraid, Rape, and Kick questionnaire    Fear of Current or Ex-Partner: No    Emotionally Abused: No    Physically Abused: No    Sexually Abused: No   Social History   Tobacco Use  Smoking Status Never  Smokeless Tobacco Never  Tobacco Comments   Never smoke 04/19/22   Social History   Substance and Sexual Activity  Alcohol Use No   Alcohol/week: 0.0 standard drinks of alcohol    Family History:  Family History  Problem Relation Age of Onset   Cancer Mother        ovarian   Dementia Father    Arthritis Sister    Heart disease Brother        MI   Stroke Paternal Grandmother    Cancer Paternal Grandfather        lung   Breast cancer Neg Hx     Past medical history, surgical history, medications, allergies, family history and social history reviewed with patient today and changes made to appropriate areas of the chart.   ROS All other ROS negative except what is listed above and in the HPI.      Objective:    BP 132/71   Pulse 60   Temp 97.6 F (36.4 C) (Oral)   Ht 5' 5.98" (1.676 m)   Wt 260 lb 11.2 oz (118.3 kg)   SpO2  96%   BMI 42.10 kg/m   Wt Readings from Last 3 Encounters:  08/04/22 260 lb 11.2 oz (118.3 kg)  07/15/22 260 lb 14.4 oz (118.3 kg)  07/08/22 258 lb (117 kg)    Physical Exam Vitals and nursing note reviewed. Exam conducted with a chaperone present.  Constitutional:      General: She is awake. She is not in acute distress.    Appearance: She is well-developed and well-groomed. She is obese. She is not ill-appearing or toxic-appearing.  HENT:     Head: Normocephalic and atraumatic.     Right Ear: Hearing, tympanic membrane, ear canal and external ear normal. No drainage.     Left Ear: Hearing, tympanic membrane, ear canal and external ear normal. No drainage.     Nose: Nose normal.     Right Sinus: No maxillary sinus tenderness or frontal sinus tenderness.     Left Sinus: No maxillary sinus tenderness or frontal sinus tenderness.     Mouth/Throat:     Mouth: Mucous membranes are moist.     Pharynx: Oropharynx is clear. Uvula midline. No pharyngeal swelling, oropharyngeal exudate or posterior oropharyngeal erythema.  Eyes:     General: Lids are normal.        Right eye: No discharge.        Left  eye: No discharge.     Extraocular Movements: Extraocular movements intact.     Conjunctiva/sclera: Conjunctivae normal.     Pupils: Pupils are equal, round, and reactive to light.     Visual Fields: Right eye visual fields normal and left eye visual fields normal.  Neck:     Thyroid: No thyromegaly.     Vascular: No carotid bruit.     Trachea: Trachea normal.  Cardiovascular:     Rate and Rhythm: Normal rate and regular rhythm.     Heart sounds: Normal heart sounds. No murmur heard.    No gallop.  Pulmonary:     Effort: Pulmonary effort is normal. No accessory muscle usage or respiratory distress.     Breath sounds: Normal breath sounds.  Chest:  Breasts:    Right: Normal.     Left: Normal.  Abdominal:     General: Bowel sounds are normal.     Palpations: Abdomen is soft. There  is no hepatomegaly or splenomegaly.     Tenderness: There is no abdominal tenderness.  Musculoskeletal:        General: Normal range of motion.     Cervical back: Normal range of motion and neck supple.     Right lower leg: No edema.     Left lower leg: No edema.  Lymphadenopathy:     Head:     Right side of head: No submental, submandibular, tonsillar, preauricular or posterior auricular adenopathy.     Left side of head: No submental, submandibular, tonsillar, preauricular or posterior auricular adenopathy.     Cervical: No cervical adenopathy.     Upper Body:     Right upper body: No supraclavicular, axillary or pectoral adenopathy.     Left upper body: No supraclavicular, axillary or pectoral adenopathy.  Skin:    General: Skin is warm and dry.     Capillary Refill: Capillary refill takes less than 2 seconds.     Findings: No rash.  Neurological:     Mental Status: She is alert and oriented to person, place, and time.     Gait: Gait is intact.     Deep Tendon Reflexes: Reflexes are normal and symmetric.     Reflex Scores:      Brachioradialis reflexes are 2+ on the right side and 2+ on the left side.      Patellar reflexes are 2+ on the right side and 2+ on the left side. Psychiatric:        Attention and Perception: Attention normal.        Mood and Affect: Mood normal.        Speech: Speech normal.        Behavior: Behavior normal. Behavior is cooperative.        Thought Content: Thought content normal.        Judgment: Judgment normal.     Results for orders placed or performed in visit on 07/15/22  Comprehensive metabolic panel  Result Value Ref Range   Glucose 86 70 - 99 mg/dL   BUN 13 8 - 27 mg/dL   Creatinine, Ser 0.80 0.57 - 1.00 mg/dL   eGFR 79 >59 mL/min/1.73   BUN/Creatinine Ratio 16 12 - 28   Sodium 144 134 - 144 mmol/L   Potassium 3.7 3.5 - 5.2 mmol/L   Chloride 104 96 - 106 mmol/L   CO2 26 20 - 29 mmol/L   Calcium 9.4 8.7 - 10.3 mg/dL   Total Protein  6.6 6.0 -  8.5 g/dL   Albumin 4.3 3.9 - 4.9 g/dL   Globulin, Total 2.3 1.5 - 4.5 g/dL   Albumin/Globulin Ratio 1.9 1.2 - 2.2   Bilirubin Total 0.4 0.0 - 1.2 mg/dL   Alkaline Phosphatase 99 44 - 121 IU/L   AST 17 0 - 40 IU/L   ALT 19 0 - 32 IU/L  Lipid Panel w/o Chol/HDL Ratio  Result Value Ref Range   Cholesterol, Total 180 100 - 199 mg/dL   Triglycerides 163 (H) 0 - 149 mg/dL   HDL 61 >39 mg/dL   VLDL Cholesterol Cal 28 5 - 40 mg/dL   LDL Chol Calc (NIH) 91 0 - 99 mg/dL  Magnesium  Result Value Ref Range   Magnesium 1.9 1.6 - 2.3 mg/dL  VITAMIN D 25 Hydroxy (Vit-D Deficiency, Fractures)  Result Value Ref Range   Vit D, 25-Hydroxy 26.5 (L) 30.0 - 100.0 ng/mL      Assessment & Plan:   Problem List Items Addressed This Visit       Other   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  Continue collaboration with oncology, recent notes reviewed.        Other Visit Diagnoses     Encounter for annual physical exam       Annual physical today -- all labs up to date and health maintenance reviewed.        Follow up plan: Return in about 6 months (around 02/02/2023) for CLL, HTN/HLD, RLS, MOOD.   LABORATORY TESTING:  - Pap smear: not applicable  IMMUNIZATIONS:   - Tdap: Tetanus vaccination status reviewed: last tetanus booster within 10 years. - Influenza: Up to date - Pneumovax: Up to date - Prevnar: Up to date - COVID: Up to date - HPV: Not applicable - Shingrix vaccine: Up to date  SCREENING: -Mammogram: Up to date  - Colonoscopy: Up to date  - Bone Density: Up to date  -Hearing Test: Not applicable  -Spirometry: Not applicable   PATIENT COUNSELING:   Advised to take 1 mg of folate supplement per day if capable of pregnancy.   Sexuality: Discussed sexually transmitted diseases, partner selection, use of condoms, avoidance of unintended pregnancy  and contraceptive alternatives.   Advised to avoid cigarette  smoking.  I discussed with the patient that most people either abstain from alcohol or drink within safe limits (<=14/week and <=4 drinks/occasion for males, <=7/weeks and <= 3 drinks/occasion for females) and that the risk for alcohol disorders and other health effects rises proportionally with the number of drinks per week and how often a drinker exceeds daily limits.  Discussed cessation/primary prevention of drug use and availability of treatment for abuse.   Diet: Encouraged to adjust caloric intake to maintain  or achieve ideal body weight, to reduce intake of dietary saturated fat and total fat, to limit sodium intake by avoiding high sodium foods and not adding table salt, and to maintain adequate dietary potassium and calcium preferably from fresh fruits, vegetables, and low-fat dairy products.    Stressed the importance of regular exercise  Injury prevention: Discussed safety belts, safety helmets, smoke detector, smoking near bedding or upholstery.   Dental health: Discussed importance of regular tooth brushing, flossing, and dental visits.    NEXT PREVENTATIVE PHYSICAL DUE IN 1 YEAR. Return in about 6 months (around 02/02/2023) for CLL, HTN/HLD, RLS, MOOD.

## 2022-08-05 ENCOUNTER — Other Ambulatory Visit (HOSPITAL_COMMUNITY): Payer: Self-pay

## 2022-08-06 ENCOUNTER — Ambulatory Visit (INDEPENDENT_AMBULATORY_CARE_PROVIDER_SITE_OTHER): Payer: Medicare HMO | Admitting: Nurse Practitioner

## 2022-08-06 ENCOUNTER — Encounter (INDEPENDENT_AMBULATORY_CARE_PROVIDER_SITE_OTHER): Payer: Self-pay | Admitting: Nurse Practitioner

## 2022-08-06 VITALS — BP 155/73 | HR 67 | Resp 18 | Ht 66.0 in | Wt 260.0 lb

## 2022-08-06 DIAGNOSIS — I8393 Asymptomatic varicose veins of bilateral lower extremities: Secondary | ICD-10-CM | POA: Diagnosis not present

## 2022-08-06 DIAGNOSIS — E785 Hyperlipidemia, unspecified: Secondary | ICD-10-CM | POA: Diagnosis not present

## 2022-08-06 DIAGNOSIS — I890 Lymphedema, not elsewhere classified: Secondary | ICD-10-CM | POA: Diagnosis not present

## 2022-08-06 NOTE — Progress Notes (Signed)
Subjective:    Patient ID: Erika Anderson, female    DOB: 1951-03-02, 72 y.o.   MRN: [id number] Chief Complaint  Patient presents with   Follow-up    Follow up 6 month no studies.    The patient returns to the office for followup evaluation regarding leg swelling.  The swelling has persisted but with the lymph pump is under much, much better controlled. The pain associated with swelling is decreased. There have not been any interval development of a ulcerations or wounds.  The patient denies problems with the pump, noting it is working well and the leggings are in good condition.  Since the previous visit the patient has been wearing graduated compression stockings and using the lymph pump on a routine basis and  has noted significant improvement in the lymphedema.   Patient stated the lymph pump has been helpful with the treatment of the lymphedema.      Review of Systems  Cardiovascular:  Positive for leg swelling.  All other systems reviewed and are negative.      Objective:   Physical Exam Vitals reviewed.  HENT:     Head: Normocephalic.  Cardiovascular:     Rate and Rhythm: Normal rate.  Pulmonary:     Effort: Pulmonary effort is normal.  Musculoskeletal:     Right lower leg: 1+ Edema present.     Left lower leg: 1+ Edema present.  Skin:    General: Skin is warm and dry.  Neurological:     Mental Status: She is alert and oriented to person, place, and time.  Psychiatric:        Mood and Affect: Mood normal.        Behavior: Behavior normal.        Thought Content: Thought content normal.        Judgment: Judgment normal.     BP (!) 155/73 (BP Location: Left Arm)   Pulse 67   Resp 18   Ht '5\' 6"'$  (1.676 m)   Wt 260 lb (117.9 kg)   BMI 41.97 kg/m   Past Medical History:  Diagnosis Date   Atrial fibrillation (HCC)    Hyperlipidemia    Leukemia (HCC)    Obesity (BMI 30-39.9)    Restless legs syndrome (RLS) 12/28/2014   RLS (restless legs syndrome)      Social History   Socioeconomic History   Marital status: Married    Spouse name: Not on file   Number of children: Not on file   Years of education: 12   Highest education level: High school graduate  Occupational History   Occupation: retired  Tobacco Use   Smoking status: Never   Smokeless tobacco: Never   Tobacco comments:    Never smoke 04/19/22  Vaping Use   Vaping Use: Never used  Substance and Sexual Activity   Alcohol use: No    Alcohol/week: 0.0 standard drinks of alcohol   Drug use: No   Sexual activity: Not Currently  Other Topics Concern   Not on file  Social History Narrative   Helps take care of grandchildren    Social Determinants of Health   Financial Resource Strain: Low Risk  (03/02/2022)   Overall Financial Resource Strain (CARDIA)    Difficulty of Paying Living Expenses: Not hard at all  Food Insecurity: No Food Insecurity (03/02/2022)   Hunger Vital Sign    Worried About Running Out of Food in the Last Year: Never true    Ran  Out of Food in the Last Year: Never true  Transportation Needs: No Transportation Needs (02/27/2021)   PRAPARE - Hydrologist (Medical): No    Lack of Transportation (Non-Medical): No  Physical Activity: Inactive (03/02/2022)   Exercise Vital Sign    Days of Exercise per Week: 0 days    Minutes of Exercise per Session: 0 min  Stress: No Stress Concern Present (03/02/2022)   [place]    Feeling of Stress : Not at all  Social Connections: [place] (03/02/2022)   Social Connection and Isolation Panel [NHANES]    Frequency of Communication with Friends and Family: More than three times a week    Frequency of Social Gatherings with Friends and Family: Twice a week    Attends Religious Services: More than 4 times per year    Active Member of [employer] or Organizations: Yes    Attends Archivist Meetings: More than 4  times per year    Marital Status: Married  Human resources officer Violence: Not At Risk (03/02/2022)   Humiliation, Afraid, Rape, and Kick questionnaire    Fear of Current or Ex-Partner: No    Emotionally Abused: No    Physically Abused: No    Sexually Abused: No    Past Surgical History:  Procedure Laterality Date   ATRIAL FIBRILLATION ABLATION N/A 03/22/2022   Procedure: ATRIAL FIBRILLATION ABLATION;  Surgeon: Vickie Epley, MD;  Location: [facility];  Service: Cardiovascular;  Laterality: N/A;   COLONOSCOPY WITH PROPOFOL N/A 08/24/2021   Procedure: COLONOSCOPY WITH PROPOFOL;  Surgeon: Jonathon Bellows, MD;  Location: [facility] ENDOSCOPY;  Service: Gastroenterology;  Laterality: N/A;   HEMORROIDECTOMY     TOTAL ABDOMINAL HYSTERECTOMY     ovaries remain    Family History  Problem Relation Age of Onset   Cancer Mother        ovarian   Dementia Father    Arthritis Sister    Heart disease Brother        MI   Stroke Paternal Grandmother    Cancer Paternal Grandfather        lung   Breast cancer Neg Hx     No Known Allergies     Latest Ref Rng & Units 07/08/2022   10:04 AM 06/10/2022    2:14 PM 05/03/2022   10:54 AM  CBC  WBC 4.0 - 10.5 K/uL 21.5  27.3  25.9   Hemoglobin 12.0 - 15.0 g/dL 13.4  13.2  13.5   Hematocrit 36.0 - 46.0 % 40.1  39.4  41.2   Platelets 150 - 400 K/uL 198  190  187       CMP     Component Value Date/Time   NA 144 07/15/2022 0000   K 3.7 07/15/2022 0000   CL 104 07/15/2022 0000   CO2 26 07/15/2022 0000   GLUCOSE 86 07/15/2022 0000   GLUCOSE 100 (H) 07/08/2022 1004   BUN 13 07/15/2022 0000   CREATININE 0.80 07/15/2022 0000   CALCIUM 9.4 07/15/2022 0000   PROT 6.6 07/15/2022 0000   ALBUMIN 4.3 07/15/2022 0000   AST 17 07/15/2022 0000   ALT 19 07/15/2022 0000   ALKPHOS 99 07/15/2022 0000   BILITOT 0.4 07/15/2022 0000   GFRNONAA >60 07/08/2022 1004   GFRAA 86 01/24/2020 0947     No results found.     Assessment & Plan:   1.  Lymphedema Recommend:  No surgery or intervention at this point in time.    I have reviewed my discussion with the patient regarding lymphedema and why it  causes symptoms.  Patient will continue wearing graduated compression on a daily basis. The patient should put the compression on first thing in the morning and removing them in the evening. The patient should not sleep in the compression.   In addition, behavioral modification throughout the day will be continued.  This will include frequent elevation (such as in a recliner), use of over the counter pain medications as needed and exercise such as walking.  The systemic causes for chronic edema such as liver, kidney and cardiac etiologies does not appear to have significant changed over the past year.    The patient will continue aggressive use of the  lymph pump.  This will continue to improve the edema control and prevent sequela such as ulcers and infections.   The patient will follow-up with me on an annual basis.   2. Hyperlipidemia, unspecified hyperlipidemia type Continue statin as ordered and reviewed, no changes at this time  3. Varicose veins of both lower extremities, unspecified whether complicated The patient does have some notable telangiectasis on her legs bilaterally but she notes that they are not currently causing any pain or issues.   Current Outpatient Medications on File Prior to Visit  Medication Sig Dispense Refill   citalopram (CELEXA) 20 MG tablet Take 1 tablet (20 mg total) by mouth daily. 90 tablet 4   ELIQUIS 5 MG TABS tablet Take 1 tablet by mouth twice daily 180 tablet 1   gabapentin (NEURONTIN) 300 MG capsule Take 2 capsules (600 mg total) by mouth at bedtime. (Patient taking differently: Take 300 mg by mouth at bedtime as needed (pain).) 180 capsule 4   metoprolol tartrate (LOPRESSOR) 25 MG tablet Take 25 mg by mouth 2 (two) times daily.     Multiple Vitamins-Minerals (MULTIVITAMIN WITH MINERALS) tablet  Take 1 tablet by mouth daily.     rOPINIRole (REQUIP) 1 MG tablet Take 1 tablet (1 mg total) by mouth 4 (four) times daily. 360 tablet 4   rosuvastatin (CRESTOR) 20 MG tablet Take 1 tablet (20 mg total) by mouth daily. 90 tablet 4   traZODone (DESYREL) 50 MG tablet Take 0.5 tablets by mouth at bedtime.     zanubrutinib (BRUKINSA) 80 MG capsule Take 2 capsules (160 mg total) by mouth 2 (two) times daily. 120 capsule 3   pantoprazole (PROTONIX) 40 MG tablet Take 1 tablet (40 mg total) by mouth daily. 90 tablet 3   No current facility-administered medications on file prior to visit.    There are no Patient Instructions on file for this visit. No follow-ups on file.   Kris Hartmann, NP

## 2022-08-13 ENCOUNTER — Other Ambulatory Visit (HOSPITAL_COMMUNITY): Payer: Self-pay

## 2022-08-16 ENCOUNTER — Other Ambulatory Visit: Payer: Self-pay

## 2022-08-26 DIAGNOSIS — I890 Lymphedema, not elsewhere classified: Secondary | ICD-10-CM | POA: Diagnosis not present

## 2022-08-30 ENCOUNTER — Other Ambulatory Visit: Payer: Self-pay | Admitting: Nurse Practitioner

## 2022-08-30 DIAGNOSIS — G2581 Restless legs syndrome: Secondary | ICD-10-CM

## 2022-08-31 NOTE — Telephone Encounter (Signed)
Requested Prescriptions  Pending Prescriptions Disp Refills   rOPINIRole (REQUIP) 1 MG tablet [Pharmacy Med Name: rOPINIRole HCl 1 MG Oral Tablet] 360 tablet 0    Sig: Take 1 tablet by mouth 4 times daily     Neurology:  Parkinsonian Agents Failed - 08/30/2022  6:41 AM      Failed - Last BP in normal range    BP Readings from Last 1 Encounters:  08/06/22 (!) 155/73         Passed - Last Heart Rate in normal range    Pulse Readings from Last 1 Encounters:  08/06/22 67         Passed - Valid encounter within last 12 months    Recent Outpatient Visits           3 weeks ago CLL (chronic lymphocytic leukemia) ([place])   [place], [place] T, NP   1 month ago CLL (chronic lymphocytic leukemia) ([place])   [place], [place] T, NP   4 months ago CLL (chronic lymphocytic leukemia) ([place])   [place], Henrine Screws T, NP   7 months ago Upper respiratory tract infection, unspecified type   [place] P, DO   7 months ago Paroxysmal atrial fibrillation ([place])   [place], Barbaraann Faster, NP       Future Appointments             In 4 months Cannady, Barbaraann Faster, NP [place], [place]   In 5 months [place], Barbaraann Faster, NP [place], [facility]

## 2022-09-07 ENCOUNTER — Other Ambulatory Visit (HOSPITAL_COMMUNITY): Payer: Self-pay

## 2022-09-07 ENCOUNTER — Other Ambulatory Visit: Payer: Self-pay | Admitting: Nurse Practitioner

## 2022-09-07 DIAGNOSIS — G2581 Restless legs syndrome: Secondary | ICD-10-CM

## 2022-09-07 NOTE — Telephone Encounter (Signed)
Requested medication (s) are due for refill today - expired Rx  Requested medication (s) are on the active medication list -yes  Future visit scheduled -yes  Last refill: 07/30/21   Notes to clinic: expired Rx  Requested Prescriptions  Pending Prescriptions Disp Refills   rOPINIRole (REQUIP) 1 MG tablet [Pharmacy Med Name: rOPINIRole HCl 1 MG Oral Tablet] 360 tablet 0    Sig: Take 1 tablet by mouth 4 times daily     Neurology:  Parkinsonian Agents Failed - 09/07/2022  2:36 PM      Failed - Last BP in normal range    BP Readings from Last 1 Encounters:  08/06/22 (!) 155/73         Passed - Last Heart Rate in normal range    Pulse Readings from Last 1 Encounters:  08/06/22 67         Passed - Valid encounter within last 12 months    Recent Outpatient Visits           1 month ago CLL (chronic lymphocytic leukemia) ([place])   [place], [place] T, NP   1 month ago CLL (chronic lymphocytic leukemia) ([place])   [place], [place] T, NP   4 months ago CLL (chronic lymphocytic leukemia) ([place])   [place], Henrine Screws T, NP   7 months ago Upper respiratory tract infection, unspecified type   [place], [place] P, DO   7 months ago Paroxysmal atrial fibrillation ([place])   [place], Barbaraann Faster, NP       Future Appointments             In 4 months Cannady, Barbaraann Faster, NP [place], [place]   In 4 months [place], [place] T, NP [place], [facility]               Requested Prescriptions  Pending Prescriptions Disp Refills   rOPINIRole (REQUIP) 1 MG tablet [Pharmacy Med Name: rOPINIRole HCl 1 MG Oral Tablet] 360 tablet 0    Sig: Take 1 tablet by mouth 4 times daily     Neurology:  Parkinsonian Agents Failed - 09/07/2022  2:36 PM      Failed - Last BP in normal range    BP  Readings from Last 1 Encounters:  08/06/22 (!) 155/73         Passed - Last Heart Rate in normal range    Pulse Readings from Last 1 Encounters:  08/06/22 67         Passed - Valid encounter within last 12 months    Recent Outpatient Visits           1 month ago CLL (chronic lymphocytic leukemia) ([place])   [place], [place] T, NP   1 month ago CLL (chronic lymphocytic leukemia) ([place])   [place], [place] T, NP   4 months ago CLL (chronic lymphocytic leukemia) ([place])   [place], Henrine Screws T, NP   7 months ago Upper respiratory tract infection, unspecified type   [place], Megan P, DO   7 months ago Paroxysmal atrial fibrillation [facility])   [place], Barbaraann Faster, NP       Future Appointments  In 4 months Cannady, Barbaraann Faster, NP [place], [place]   In 4 months [place], Barbaraann Faster, NP [place], [facility]

## 2022-09-09 ENCOUNTER — Other Ambulatory Visit (HOSPITAL_COMMUNITY): Payer: Self-pay

## 2022-09-14 ENCOUNTER — Other Ambulatory Visit: Payer: Self-pay | Admitting: Nurse Practitioner

## 2022-09-14 DIAGNOSIS — L218 Other seborrheic dermatitis: Secondary | ICD-10-CM | POA: Diagnosis not present

## 2022-09-15 NOTE — Telephone Encounter (Signed)
Requested Prescriptions  Pending Prescriptions Disp Refills   ELIQUIS 5 MG TABS tablet [Pharmacy Med Name: Eliquis 5 MG Oral Tablet] 180 tablet 0    Sig: Take 1 tablet by mouth twice daily     Hematology:  Anticoagulants - apixaban Passed - 09/14/2022  6:42 AM      Passed - PLT in normal range and within 360 days    Platelets  Date Value Ref Range Status  07/08/2022 198 150 - 400 K/uL Final  02/19/2022 199 150 - 450 x10E3/uL Final         Passed - HGB in normal range and within 360 days    Hemoglobin  Date Value Ref Range Status  07/08/2022 13.4 12.0 - 15.0 g/dL Final  16/60/6301 60.1 11.1 - 15.9 g/dL Final         Passed - HCT in normal range and within 360 days    HCT  Date Value Ref Range Status  07/08/2022 40.1 36.0 - 46.0 % Final   Hematocrit  Date Value Ref Range Status  02/19/2022 41.9 34.0 - 46.6 % Final         Passed - Cr in normal range and within 360 days    Creatinine, Ser  Date Value Ref Range Status  07/15/2022 0.80 0.57 - 1.00 mg/dL Final         Passed - AST in normal range and within 360 days    AST  Date Value Ref Range Status  07/15/2022 17 0 - 40 IU/L Final         Passed - ALT in normal range and within 360 days    ALT  Date Value Ref Range Status  07/15/2022 19 0 - 32 IU/L Final         Passed - Valid encounter within last 12 months    Recent Outpatient Visits           1 month ago CLL (chronic lymphocytic leukemia) (HCC)   [place] [facility] [place], [place] T, NP   2 months ago CLL (chronic lymphocytic leukemia) (HCC)   [place] [facility] [place], [place] [employer] [employer] T, NP   5 months ago CLL (chronic lymphocytic leukemia) (HCC)   [place] [facility] [place], Corrie Dandy T, NP   7 months ago Upper respiratory tract infection, unspecified type   [place] [facility] [place], Megan P, DO   7 months ago Paroxysmal atrial fibrillation (HCC)   [place] [facility] [place],  Dorie Rank, NP       Future Appointments             In 4 months Cannady, Dorie Rank, NP [place] [employer], [facility]   In 4 months [place], Dorie Rank, NP [place] [employer], [facility]

## 2022-09-21 ENCOUNTER — Ambulatory Visit (INDEPENDENT_AMBULATORY_CARE_PROVIDER_SITE_OTHER): Payer: Medicare HMO | Admitting: Nurse Practitioner

## 2022-09-21 ENCOUNTER — Encounter: Payer: Self-pay | Admitting: Nurse Practitioner

## 2022-09-21 VITALS — BP 131/78 | HR 61 | Temp 97.6°F | Ht 65.98 in | Wt 263.9 lb

## 2022-09-21 DIAGNOSIS — Z6841 Body Mass Index (BMI) 40.0 and over, adult: Secondary | ICD-10-CM

## 2022-09-21 DIAGNOSIS — G8929 Other chronic pain: Secondary | ICD-10-CM

## 2022-09-21 DIAGNOSIS — M545 Low back pain, unspecified: Secondary | ICD-10-CM | POA: Diagnosis not present

## 2022-09-21 DIAGNOSIS — M170 Bilateral primary osteoarthritis of knee: Secondary | ICD-10-CM | POA: Diagnosis not present

## 2022-09-21 DIAGNOSIS — C9110 Chronic lymphocytic leukemia of B-cell type not having achieved remission: Secondary | ICD-10-CM | POA: Diagnosis not present

## 2022-09-21 DIAGNOSIS — I890 Lymphedema, not elsewhere classified: Secondary | ICD-10-CM | POA: Diagnosis not present

## 2022-09-21 NOTE — Assessment & Plan Note (Signed)
Chronic, ongoing.  Will place referral to pain management and weight management, both which may offer benefit to her ongoing discomfort and mobility.

## 2022-09-21 NOTE — Assessment & Plan Note (Signed)
Chronic, ongoing.  Followed by ortho.  Continue this collaboration.  Will place referral to weight management and pain management per request as well, which will offer benefit.

## 2022-09-21 NOTE — Assessment & Plan Note (Signed)
Recommended eating smaller high protein, low fat meals more frequently and exercising 30 mins a day 5 times a week with a goal of 10-15lb weight loss in the next 3 months. Patient voiced their understanding and motivation to adhere to these recommendations.  

## 2022-09-21 NOTE — Assessment & Plan Note (Signed)
Chronic, on treatment.  Diagnosed on 04/01/22.  Continue collaboration with oncology, recent notes reviewed.

## 2022-09-21 NOTE — Patient Instructions (Signed)

## 2022-09-21 NOTE — Assessment & Plan Note (Addendum)
BMI 42.62.  Recommended eating smaller high protein, low fat meals more frequently and exercising 30 mins a day 5 times a week with a goal of 10-15lb weight loss in the next 3 months. Patient voiced their understanding and motivation to adhere to these recommendations.  Referral to weight management.  Would benefit from a GLP1 in future, but unsure insurance will cover this for her.

## 2022-09-21 NOTE — Progress Notes (Signed)
BP 131/78   Pulse 61   Temp 97.6 F (36.4 C) (Oral)   Ht 5' 5.98" (1.676 m)   Wt 263 lb 14.4 oz (119.7 kg)   SpO2 97%   BMI 42.62 kg/m    Subjective:    Patient ID: Erika Anderson, female    DOB: September 16, 1950, 72 y.o.   MRN: [id number]  HPI: Erika Anderson is a 72 y.o. female  Chief Complaint  Patient presents with   Edema    Left leg swelling   EDEMA LEGS She is followed by vascular for lymphedema, last visit was 08/06/2022.  She is to continue compression on daily basis + lymph pumps. Reports fluid more to left leg leg.  Has underlying CLL and is followed by oncology.  Reports the swelling has been present since ablation in October 2023.  No leg pain at present.  At baseline has knee pain due to arthritis.  Has to go down steps backwards at home.  She is interested in pain clinic for back pain. In past did see Dr. Marcell Barlow and Dr. [place] at [facility], received injections to back.  She has asked about knee surgery, but ortho has recommended holding off due to her medications and a-fib.  Would like to lose weight, reports she does exercise daily (gardening and moving furniture) + tries to work on diet (eats out every other day -- burger or hot dogs).   Recurrent headaches: no Visual changes: no Palpitations: no Dyspnea: no Chest pain: no Lower extremity edema: yes Dizzy/lightheaded: no     09/21/2022    9:11 AM 08/04/2022    8:13 AM 07/15/2022   10:14 AM 04/14/2022    9:25 AM 03/02/2022    8:41 AM  Depression screen PHQ 2/9  Decreased Interest 0 1 0 0 0  Down, Depressed, Hopeless 0 0 0 0 0  PHQ - 2 Score 0 1 0 0 0  Altered sleeping 1 1 0 1 2  Tired, decreased energy 1 1 0 3 2  Change in appetite 1 1 0 0 0  Feeling bad or failure about yourself  0 0 0 0 0  Trouble concentrating 0 0 0 0 0  Moving slowly or fidgety/restless 0 0 0 0 0  Suicidal thoughts 0 0 0 0 0  PHQ-9 Score 3 4 0 4 4  Difficult doing work/chores Not difficult at all Not difficult at all Not  difficult at all Somewhat difficult Not difficult at all       09/21/2022    9:12 AM 08/04/2022    8:13 AM 07/15/2022   10:15 AM 04/14/2022    9:25 AM  GAD 7 : Generalized Anxiety Score  Nervous, Anxious, on Edge 0 0 0 0  Control/stop worrying 0 0 0 0  Worry too much - different things 0 0 0 0  Trouble relaxing 1 0 0 2  Restless 1 0 0 2  Easily annoyed or irritable 0 0 0 0  Afraid - awful might happen 0 0 0 0  Total GAD 7 Score 2 0 0 4  Anxiety Difficulty Not difficult at all Not difficult at all Not difficult at all Not difficult at all      Relevant past medical, surgical, family and social history reviewed and updated as indicated. Interim medical history since our last visit reviewed. Allergies and medications reviewed and updated.  Review of Systems  Constitutional:  Negative for activity change, appetite change, diaphoresis, fatigue and fever.  Respiratory:  Negative for cough, chest tightness, shortness of breath and wheezing.   Cardiovascular:  Positive for leg swelling. Negative for chest pain and palpitations.  Gastrointestinal: Negative.   Musculoskeletal:  Positive for arthralgias.  Neurological: Negative.   Psychiatric/Behavioral: Negative.     Per HPI unless specifically indicated above     Objective:    BP 131/78   Pulse 61   Temp 97.6 F (36.4 C) (Oral)   Ht 5' 5.98" (1.676 m)   Wt 263 lb 14.4 oz (119.7 kg)   SpO2 97%   BMI 42.62 kg/m   Wt Readings from Last 3 Encounters:  09/21/22 263 lb 14.4 oz (119.7 kg)  08/06/22 260 lb (117.9 kg)  08/04/22 260 lb 11.2 oz (118.3 kg)    Physical Exam Vitals and nursing note reviewed.  Constitutional:      General: She is awake. She is not in acute distress.    Appearance: She is well-developed and well-groomed. She is obese. She is not ill-appearing or toxic-appearing.  HENT:     Head: Normocephalic.     Right Ear: Hearing normal.     Left Ear: Hearing normal.  Eyes:     General: Lids are normal.         Right eye: No discharge.        Left eye: No discharge.     Conjunctiva/sclera: Conjunctivae normal.     Pupils: Pupils are equal, round, and reactive to light.  Neck:     Thyroid: No thyromegaly.     Vascular: No carotid bruit.  Cardiovascular:     Rate and Rhythm: Normal rate and regular rhythm.     Heart sounds: Normal heart sounds. No murmur heard.    No gallop. No S3 or S4 sounds.     Comments: Compression on. Pulmonary:     Effort: Pulmonary effort is normal. No accessory muscle usage or respiratory distress.     Breath sounds: Normal breath sounds.  Abdominal:     General: Bowel sounds are normal. There is no distension.     Palpations: Abdomen is soft.     Tenderness: There is no abdominal tenderness.  Musculoskeletal:     Cervical back: Normal range of motion and neck supple.     Right lower leg: 1+ Edema present.     Left lower leg: 1+ Edema present.  Lymphadenopathy:     Cervical: No cervical adenopathy.  Skin:    General: Skin is warm and dry.  Neurological:     Mental Status: She is alert and oriented to person, place, and time.  Psychiatric:        Attention and Perception: Attention normal.        Mood and Affect: Mood normal.        Speech: Speech normal.        Behavior: Behavior normal. Behavior is cooperative.        Thought Content: Thought content normal.     Results for orders placed or performed in visit on 07/15/22  Comprehensive metabolic panel  Result Value Ref Range   Glucose 86 70 - 99 mg/dL   BUN 13 8 - 27 mg/dL   Creatinine, Ser 1.61 0.57 - 1.00 mg/dL   eGFR 79 >09 UE/AVW/0.98   BUN/Creatinine Ratio 16 12 - 28   Sodium 144 134 - 144 mmol/L   Potassium 3.7 3.5 - 5.2 mmol/L   Chloride 104 96 - 106 mmol/L   CO2 26 20 - 29 mmol/L  Calcium 9.4 8.7 - 10.3 mg/dL   Total Protein 6.6 6.0 - 8.5 g/dL   Albumin 4.3 3.9 - 4.9 g/dL   Globulin, Total 2.3 1.5 - 4.5 g/dL   Albumin/Globulin Ratio 1.9 1.2 - 2.2   Bilirubin Total 0.4 0.0 - 1.2 mg/dL    Alkaline Phosphatase 99 44 - 121 IU/L   AST 17 0 - 40 IU/L   ALT 19 0 - 32 IU/L  Lipid Panel w/o Chol/HDL Ratio  Result Value Ref Range   Cholesterol, Total 180 100 - 199 mg/dL   Triglycerides 454 (H) 0 - 149 mg/dL   HDL 61 >09 mg/dL   VLDL Cholesterol Cal 28 5 - 40 mg/dL   LDL Chol Calc (NIH) 91 0 - 99 mg/dL  Magnesium  Result Value Ref Range   Magnesium 1.9 1.6 - 2.3 mg/dL  VITAMIN D 25 Hydroxy (Vit-D Deficiency, Fractures)  Result Value Ref Range   Vit D, 25-Hydroxy 26.5 (L) 30.0 - 100.0 ng/mL      Assessment & Plan:   Problem List Items Addressed This Visit       Musculoskeletal and Integument   Osteoarthritis of both knees (Chronic)    Chronic, ongoing.  Followed by ortho.  Continue this collaboration.  Will place referral to weight management and pain management per request as well, which will offer benefit.      Relevant Orders   Ambulatory referral to Pain Clinic     Other   BMI 40.0-44.9, adult (Chronic)    Recommended eating smaller high protein, low fat meals more frequently and exercising 30 mins a day 5 times a week with a goal of 10-15lb weight loss in the next 3 months. Patient voiced their understanding and motivation to adhere to these recommendations.      Relevant Orders   Amb Ref to Medical Weight Management   CLL (chronic lymphocytic leukemia) - Primary (Chronic)    Chronic, on treatment.  Diagnosed on 04/01/22.  Continue collaboration with oncology, recent notes reviewed.        Morbid obesity (Chronic)    BMI 42.62.  Recommended eating smaller high protein, low fat meals more frequently and exercising 30 mins a day 5 times a week with a goal of 10-15lb weight loss in the next 3 months. Patient voiced their understanding and motivation to adhere to these recommendations.  Referral to weight management.  Would benefit from a GLP1 in future, but unsure insurance will cover this for her.       Relevant Orders   Amb Ref to Medical Weight Management    Chronic bilateral low back pain without sciatica    Chronic, ongoing.  Will place referral to pain management and weight management, both which may offer benefit to her ongoing discomfort and mobility.      Relevant Orders   Ambulatory referral to Pain Clinic   Lymphedema    Chronic, ongoing.  Continue collaboration with vascular and compression pumps per them.        Follow up plan: Return for keep August 8th physical -- can cancel August 28th visit here at office.

## 2022-09-21 NOTE — Assessment & Plan Note (Signed)
Chronic, ongoing.  Continue collaboration with vascular and compression pumps per them.

## 2022-09-23 DIAGNOSIS — M25552 Pain in left hip: Secondary | ICD-10-CM | POA: Diagnosis not present

## 2022-09-23 DIAGNOSIS — M25551 Pain in right hip: Secondary | ICD-10-CM | POA: Diagnosis not present

## 2022-09-23 DIAGNOSIS — M545 Low back pain, unspecified: Secondary | ICD-10-CM | POA: Diagnosis not present

## 2022-09-26 DIAGNOSIS — I890 Lymphedema, not elsewhere classified: Secondary | ICD-10-CM | POA: Diagnosis not present

## 2022-10-05 ENCOUNTER — Other Ambulatory Visit (HOSPITAL_COMMUNITY): Payer: Self-pay

## 2022-10-07 ENCOUNTER — Other Ambulatory Visit: Payer: Self-pay

## 2022-10-07 ENCOUNTER — Other Ambulatory Visit (HOSPITAL_COMMUNITY): Payer: Self-pay

## 2022-10-07 ENCOUNTER — Ambulatory Visit
Admission: RE | Admit: 2022-10-07 | Discharge: 2022-10-07 | Disposition: A | Discharge status: Home / Self Care | Payer: Medicare HMO | Source: Ambulatory Visit | Attending: Oncology | Admitting: Oncology

## 2022-10-07 ENCOUNTER — Ambulatory Visit
Admission: RE | Admit: 2022-10-07 | Discharge: 2022-10-07 | Disposition: A | Discharge status: Home / Self Care | Payer: Medicare HMO | Attending: Oncology | Admitting: Oncology

## 2022-10-07 ENCOUNTER — Encounter: Payer: Self-pay | Admitting: Oncology

## 2022-10-07 ENCOUNTER — Inpatient Hospital Stay: Payer: Medicare HMO | Admitting: Oncology

## 2022-10-07 ENCOUNTER — Inpatient Hospital Stay: Payer: Medicare HMO | Attending: Oncology

## 2022-10-07 VITALS — BP 121/77 | HR 65 | Temp 96.5°F | Resp 18 | Wt 255.5 lb

## 2022-10-07 DIAGNOSIS — Z1509 Genetic susceptibility to other malignant neoplasm: Secondary | ICD-10-CM | POA: Insufficient documentation

## 2022-10-07 DIAGNOSIS — Z8249 Family history of ischemic heart disease and other diseases of the circulatory system: Secondary | ICD-10-CM | POA: Diagnosis not present

## 2022-10-07 DIAGNOSIS — G2581 Restless legs syndrome: Secondary | ICD-10-CM | POA: Diagnosis not present

## 2022-10-07 DIAGNOSIS — Z1501 Genetic susceptibility to malignant neoplasm of breast: Secondary | ICD-10-CM | POA: Insufficient documentation

## 2022-10-07 DIAGNOSIS — Z801 Family history of malignant neoplasm of trachea, bronchus and lung: Secondary | ICD-10-CM | POA: Insufficient documentation

## 2022-10-07 DIAGNOSIS — R0602 Shortness of breath: Secondary | ICD-10-CM | POA: Insufficient documentation

## 2022-10-07 DIAGNOSIS — E785 Hyperlipidemia, unspecified: Secondary | ICD-10-CM | POA: Diagnosis not present

## 2022-10-07 DIAGNOSIS — Z79899 Other long term (current) drug therapy: Secondary | ICD-10-CM | POA: Diagnosis not present

## 2022-10-07 DIAGNOSIS — Z1502 Genetic susceptibility to malignant neoplasm of ovary: Secondary | ICD-10-CM | POA: Diagnosis not present

## 2022-10-07 DIAGNOSIS — C9110 Chronic lymphocytic leukemia of B-cell type not having achieved remission: Secondary | ICD-10-CM

## 2022-10-07 DIAGNOSIS — G479 Sleep disorder, unspecified: Secondary | ICD-10-CM | POA: Insufficient documentation

## 2022-10-07 DIAGNOSIS — Z803 Family history of malignant neoplasm of breast: Secondary | ICD-10-CM | POA: Insufficient documentation

## 2022-10-07 DIAGNOSIS — E669 Obesity, unspecified: Secondary | ICD-10-CM | POA: Diagnosis not present

## 2022-10-07 DIAGNOSIS — Z90710 Acquired absence of both cervix and uterus: Secondary | ICD-10-CM | POA: Insufficient documentation

## 2022-10-07 DIAGNOSIS — Z8261 Family history of arthritis: Secondary | ICD-10-CM | POA: Diagnosis not present

## 2022-10-07 DIAGNOSIS — Z5111 Encounter for antineoplastic chemotherapy: Secondary | ICD-10-CM | POA: Diagnosis not present

## 2022-10-07 DIAGNOSIS — Z823 Family history of stroke: Secondary | ICD-10-CM | POA: Diagnosis not present

## 2022-10-07 DIAGNOSIS — I4891 Unspecified atrial fibrillation: Secondary | ICD-10-CM | POA: Insufficient documentation

## 2022-10-07 DIAGNOSIS — Z8041 Family history of malignant neoplasm of ovary: Secondary | ICD-10-CM | POA: Diagnosis not present

## 2022-10-07 DIAGNOSIS — Z818 Family history of other mental and behavioral disorders: Secondary | ICD-10-CM | POA: Diagnosis not present

## 2022-10-07 DIAGNOSIS — Z7901 Long term (current) use of anticoagulants: Secondary | ICD-10-CM | POA: Diagnosis not present

## 2022-10-07 LAB — CBC WITH DIFFERENTIAL/PLATELET
Abs Immature Granulocytes: 0.03 10*3/uL (ref 0.00–0.07)
Basophils Absolute: 0.1 10*3/uL (ref 0.0–0.1)
Basophils Relative: 1 %
Eosinophils Absolute: 0.1 10*3/uL (ref 0.0–0.5)
Eosinophils Relative: 1 %
HCT: 40.3 % (ref 36.0–46.0)
Hemoglobin: 13.3 g/dL (ref 12.0–15.0)
Immature Granulocytes: 0 %
Lymphocytes Relative: 45 %
Lymphs Abs: 4.9 10*3/uL — ABNORMAL HIGH (ref 0.7–4.0)
MCH: 30.6 pg (ref 26.0–34.0)
MCHC: 33 g/dL (ref 30.0–36.0)
MCV: 92.9 fL (ref 80.0–100.0)
Monocytes Absolute: 0.7 10*3/uL (ref 0.1–1.0)
Monocytes Relative: 7 %
Neutro Abs: 4.9 10*3/uL (ref 1.7–7.7)
Neutrophils Relative %: 46 %
Platelets: 202 10*3/uL (ref 150–400)
RBC: 4.34 MIL/uL (ref 3.87–5.11)
RDW: 12.8 % (ref 11.5–15.5)
WBC: 10.6 10*3/uL — ABNORMAL HIGH (ref 4.0–10.5)
nRBC: 0 % (ref 0.0–0.2)

## 2022-10-07 LAB — COMPREHENSIVE METABOLIC PANEL
ALT: 24 U/L (ref 0–44)
AST: 25 U/L (ref 15–41)
Albumin: 4.1 g/dL (ref 3.5–5.0)
Alkaline Phosphatase: 72 U/L (ref 38–126)
Anion gap: 8 (ref 5–15)
BUN: 24 mg/dL — ABNORMAL HIGH (ref 8–23)
CO2: 30 mmol/L (ref 22–32)
Calcium: 9.4 mg/dL (ref 8.9–10.3)
Chloride: 102 mmol/L (ref 98–111)
Creatinine, Ser: 0.99 mg/dL (ref 0.44–1.00)
GFR, Estimated: >60 mL/min (ref >60)
Glucose, Bld: 107 mg/dL — ABNORMAL HIGH (ref 70–99)
Potassium: 4.7 mmol/L (ref 3.5–5.1)
Sodium: 140 mmol/L (ref 135–145)
Total Bilirubin: 0.6 mg/dL (ref 0.3–1.2)
Total Protein: 7 g/dL (ref 6.5–8.1)

## 2022-10-07 MED ORDER — BRUKINSA 80 MG PO CAPS
160.0000 mg | ORAL_CAPSULE | Freq: Two times a day (BID) | ORAL | 3 refills | Status: DC
  Filled 2022-10-07: qty 120, 30d supply, fill #0
  Filled 2022-11-09: qty 120, 30d supply, fill #1
  Filled 2022-12-08: qty 120, 30d supply, fill #2
  Filled 2023-01-07: qty 120, 30d supply, fill #3

## 2022-10-07 NOTE — Progress Notes (Signed)
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 Rapid lymphocyte increment, 50% over 2 months period She is on Elqiuis, will watch for possible increase risk of bleeding.  Labs are reviewed and discussed with patient. Continue Zanubrutinib 160mg  BID, clinically she tolerates well.  For future elective surgery, recommend patient to hold off Zanubrutinib 3-5 days prior to procedure and resume after   Encounter for antineoplastic chemotherapy Continue Zanubrutinib 160mg  BID  RLS (restless legs syndrome) Iron panel showed adequate iron store Continue to monitor.    Follow up 3 months  [name], MD, PhD [facility] Hematology Oncology 10/07/2022       HISTORY OF PRESENTING ILLNESS:  Erika Anderson [facility] 72 y.o. female presents to follow up with CLL. She was found to have abnormal CBC on 03/01/22, total white count of 15, predominantly lymphocytosis. This is chronic since Feb 2023. Peripheral blood smear + smudge cells.  + Restless leg syndrome, sleep disturbance for 1 year.  + Family history of ovarian cancer -mother.    Oncology History  CLL (chronic lymphocytic leukemia) (HCC)  03/02/2022 Initial Diagnosis   CLL (chronic lymphocytic leukemia)  -03/15/22 Flowcytometry showed chronic lymphocytic leukemia, B cell, CD38 expression indeterminate for prognosis (23%) and CD20 positive.  45% nuclei positive for TP53 gene  deletion.    03/26/2022 Imaging   US abdomen 1. No sonographic etiology for night sweats identified. 2. Spleen is at the upper limits of normal in size    04/01/2022 Cancer Staging   Staging form: Chronic Lymphocytic Leukemia / Small Lymphocytic Lymphoma, AJCC 8th Edition - Clinical: Modified Rai Stage 0 (Modified Rai risk: Low, Lymphocytosis: Present, Adenopathy: Absent, Organomegaly: Absent, Anemia: Absent, Thrombocytopenia: Absent) - Signed by [name], MD on 04/01/2022 Stage prefix: Initial diagnosis Absolute lymphocyte count (ALC) (cells/uL): 18   04/19/2022 -  Chemotherapy   Started on Zanubrutinib 160mg  BID      She reports tolerating Zanubrutinib 160mg  BID well. Night sweat has decreased frequency.  Appetite is fair. + Knee pain, there is plan for physical therapy and a possible future knee replacement.   MEDICAL HISTORY:  Past Medical History:  Diagnosis Date   Atrial fibrillation (HCC)    Hyperlipidemia    Leukemia (HCC)    Obesity (BMI 30-39.9)    Restless legs syndrome (RLS) 12/28/2014   RLS (restless legs syndrome)     SURGICAL HISTORY: Past Surgical History:  Procedure Laterality Date   ATRIAL FIBRILLATION ABLATION N/A 03/22/2022   Procedure: ATRIAL FIBRILLATION ABLATION;  Surgeon: Lanier Prude, MD;  Location: [facility] INVASIVE CV LAB;  Service: Cardiovascular;  Laterality: N/A;   COLONOSCOPY WITH PROPOFOL N/A 08/24/2021   Procedure: COLONOSCOPY WITH PROPOFOL;  Surgeon: Wyline Mood, MD;  Location: [facility] ENDOSCOPY;  Service: Gastroenterology;  Laterality: N/A;   HEMORROIDECTOMY     TOTAL ABDOMINAL HYSTERECTOMY     ovaries remain    SOCIAL HISTORY: Social History   Socioeconomic History   Marital status: Married    Spouse name: Not on file   Number of children: Not on file  Years of education: 58   Highest education level: High school graduate  Occupational History   Occupation: retired  Tobacco Use   Smoking status: Never   Smokeless tobacco:  Never   Tobacco comments:    Never smoke 04/19/22  Vaping Use   Vaping Use: Never used  Substance and Sexual Activity   Alcohol use: No    Alcohol/week: 0.0 standard drinks of alcohol   Drug use: No   Sexual activity: Not Currently  Other Topics Concern   Not on file  Social History Narrative   Helps take care of grandchildren    Social Determinants of Health   Financial Resource Strain: Low Risk  (03/02/2022)   Overall Financial Resource Strain (CARDIA)    Difficulty of Paying Living Expenses: Not hard at all  Food Insecurity: No Food Insecurity (03/02/2022)   Hunger Vital Sign    Worried About Running Out of Food in the Last Year: Never true    Ran Out of Food in the Last Year: Never true  Transportation Needs: No Transportation Needs (02/27/2021)   PRAPARE - Administrator, Civil Service (Medical): No    Lack of Transportation (Non-Medical): No  Physical Activity: Inactive (03/02/2022)   Exercise Vital Sign    Days of Exercise per Week: 0 days    Minutes of Exercise per Session: 0 min  Stress: No Stress Concern Present (03/02/2022)   [employer] of Occupational Health - Occupational Stress Questionnaire    Feeling of Stress : Not at all  Social Connections: Socially Integrated (03/02/2022)   Social Connection and Isolation Panel [NHANES]    Frequency of Communication with Friends and Family: More than three times a week    Frequency of Social Gatherings with Friends and Family: Twice a week    Attends Religious Services: More than 4 times per year    Active Member of [employer] or Organizations: Yes    Attends Engineer, structural: More than 4 times per year    Marital Status: Married  Catering manager Violence: Not At Risk (03/02/2022)   Humiliation, Afraid, Rape, and Kick questionnaire    Fear of Current or Ex-Partner: No    Emotionally Abused: No    Physically Abused: No    Sexually Abused: No    FAMILY HISTORY: Family History  Problem Relation  Age of Onset   Cancer Mother        ovarian   Dementia Father    Arthritis Sister    Heart disease Brother        MI   Stroke Paternal Grandmother    Cancer Paternal Grandfather        lung   Breast cancer Neg Hx     ALLERGIES:  has No Known Allergies.  MEDICATIONS:  Current Outpatient Medications  Medication Sig Dispense Refill   apixaban (ELIQUIS) 5 MG TABS tablet Take 1 tablet by mouth twice daily 180 tablet 1   betamethasone dipropionate 0.05 % lotion Apply topically daily.     citalopram (CELEXA) 20 MG tablet Take 1 tablet (20 mg total) by mouth daily. 90 tablet 4   gabapentin (NEURONTIN) 300 MG capsule Take 2 capsules (600 mg total) by mouth at bedtime. (Patient taking differently: Take 300 mg by mouth at bedtime as needed (pain).) 180 capsule 4   ketoconazole (NIZORAL) 2 % shampoo Apply topically 3 (three) times a week.     metoprolol tartrate (LOPRESSOR) 25 MG tablet Take 25 mg by mouth 2 (two)  times daily.     Multiple Vitamins-Minerals (MULTIVITAMIN WITH MINERALS) tablet Take 1 tablet by mouth daily.     pantoprazole (PROTONIX) 40 MG tablet Take 1 tablet (40 mg total) by mouth daily. 90 tablet 3   rOPINIRole (REQUIP) 1 MG tablet Take 1 tablet by mouth 4 times daily 360 tablet 4   rosuvastatin (CRESTOR) 20 MG tablet Take 1 tablet (20 mg total) by mouth daily. 90 tablet 4   traZODone (DESYREL) 50 MG tablet Take 0.5 tablets by mouth at bedtime.     zanubrutinib (BRUKINSA) 80 MG capsule Take 2 capsules (160 mg total) by mouth 2 (two) times daily. 120 capsule 3   No current facility-administered medications for this visit.    Review of Systems  Constitutional:  Negative for appetite change, chills, fatigue and fever.  HENT:   Negative for hearing loss and voice change.   Eyes:  Negative for eye problems.  Respiratory:  Negative for chest tightness and cough.   Cardiovascular:  Negative for chest pain.  Gastrointestinal:  Negative for abdominal distention, abdominal pain  and blood in stool.  Endocrine: Negative for hot flashes.       Night sweat  Genitourinary:  Negative for difficulty urinating and frequency.   Musculoskeletal:  Negative for arthralgias.  Skin:  Negative for itching and rash.  Neurological:  Negative for extremity weakness.  Hematological:  Negative for adenopathy.  Psychiatric/Behavioral:  Positive for sleep disturbance. Negative for confusion.      PHYSICAL EXAMINATION: ECOG PERFORMANCE STATUS: 1 - Symptomatic but completely ambulatory  Vitals:   10/07/22 1113  BP: 121/77  Pulse: 65  Resp: 18  Temp: (!) 96.5 F (35.8 C)   Filed Weights   10/07/22 1113  Weight: 255 lb 8 oz (115.9 kg)    Physical Exam HENT:     Head: Normocephalic.     Nose: Nose normal.     Mouth/Throat:     Pharynx: No oropharyngeal exudate.  Eyes:     General: No scleral icterus.    Pupils: Pupils are equal, round, and reactive to light.  Cardiovascular:     Rate and Rhythm: Normal rate.  Pulmonary:     Effort: Pulmonary effort is normal. No respiratory distress.     Breath sounds: No wheezing.  Abdominal:     General: There is no distension.     Palpations: Abdomen is soft.  Musculoskeletal:        General: Normal range of motion.     Cervical back: Normal range of motion.  Skin:    General: Skin is warm and dry.     Findings: No erythema.  Neurological:     Mental Status: She is alert and oriented to person, place, and time. Mental status is at baseline.     Cranial Nerves: No cranial nerve deficit.     Motor: No abnormal muscle tone.  Psychiatric:        Mood and Affect: Affect normal.     LABORATORY DATA:  I have reviewed the data as listed    Latest Ref Rng & Units 10/07/2022   10:34 AM 07/08/2022   10:04 AM 06/10/2022    2:14 PM  CBC  WBC 4.0 - 10.5 K/uL 10.6  21.5  27.3   Hemoglobin 12.0 - 15.0 g/dL 16.1  09.6  04.5   Hematocrit 36.0 - 46.0 % 40.3  40.1  39.4   Platelets 150 - 400 K/uL 202  198  190  Latest Ref Rng &  Units 10/07/2022   10:34 AM 07/15/2022   12:00 AM 07/08/2022   10:04 AM  CMP  Glucose 70 - 99 mg/dL 295  86  621   BUN 8 - 23 mg/dL 24  13  19    Creatinine 0.44 - 1.00 mg/dL 3.08  6.57  8.46   Sodium 135 - 145 mmol/L 140  144  141   Potassium 3.5 - 5.1 mmol/L 4.7  3.7  4.0   Chloride 98 - 111 mmol/L 102  104  104   CO2 22 - 32 mmol/L 30  26  28    Calcium 8.9 - 10.3 mg/dL 9.4  9.4  9.1   Total Protein 6.5 - 8.1 g/dL 7.0  6.6  7.2   Total Bilirubin 0.3 - 1.2 mg/dL 0.6  0.4  0.7   Alkaline Phos 38 - 126 U/L 72  99  76   AST 15 - 41 U/L 25  17  29    ALT 0 - 44 U/L 24  19  29     Lab Results  Component Value Date   LDH 192 03/15/2022    RADIOGRAPHIC STUDIES: I have personally reviewed the radiological images as listed and agreed with the findings in the report. No results found.

## 2022-10-07 NOTE — Assessment & Plan Note (Addendum)
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 Rapid lymphocyte increment, 50% over 2 months period She is on Elqiuis, will watch for possible increase risk of bleeding.  Labs are reviewed and discussed with patient. Continue Zanubrutinib 160mg  BID, clinically she tolerates well.  For future elective surgery, recommend patient to hold off Zanubrutinib 3-5 days prior to procedure and resume after

## 2022-10-07 NOTE — Assessment & Plan Note (Signed)
Iron panel showed adequate iron store Continue to monitor.

## 2022-10-07 NOTE — Assessment & Plan Note (Signed)
Continue Zanubrutinib 160mg BID 

## 2022-10-07 NOTE — Progress Notes (Signed)
Pt here for follow up. Would like to discuss with Dr. Cathie Hoops about having knee surgery.

## 2022-10-08 ENCOUNTER — Other Ambulatory Visit: Payer: Self-pay

## 2022-10-08 ENCOUNTER — Other Ambulatory Visit (HOSPITAL_COMMUNITY): Payer: Self-pay

## 2022-10-08 ENCOUNTER — Other Ambulatory Visit: Payer: Self-pay | Admitting: Nurse Practitioner

## 2022-10-08 NOTE — Telephone Encounter (Signed)
Requested Prescriptions  Pending Prescriptions Disp Refills   citalopram (CELEXA) 20 MG tablet [Pharmacy Med Name: Citalopram Hydrobromide 20 MG Oral Tablet] 90 tablet 0    Sig: Take 1 tablet by mouth once daily     Psychiatry:  Antidepressants - SSRI Passed - 10/08/2022  6:42 AM      Passed - Valid encounter within last 6 months    Recent Outpatient Visits           2 weeks ago CLL (chronic lymphocytic leukemia) (HCC)   [place] [facility] [place], Max T, NP   2 months ago CLL (chronic lymphocytic leukemia) (HCC)   [place] [facility] [place], Corrie Dandy T, NP   2 months ago CLL (chronic lymphocytic leukemia) (HCC)   [place] [facility] [place], Corrie Dandy T, NP   5 months ago CLL (chronic lymphocytic leukemia) (HCC)   [place] [facility] [place], Corrie Dandy T, NP   8 months ago Upper respiratory tract infection, unspecified type   [place] [facility] [place], Oralia Rud, DO       Future Appointments             In 3 months Cannady, Dorie Rank, NP [place] [facility]

## 2022-10-14 DIAGNOSIS — M545 Low back pain, unspecified: Secondary | ICD-10-CM | POA: Diagnosis not present

## 2022-10-18 ENCOUNTER — Encounter (INDEPENDENT_AMBULATORY_CARE_PROVIDER_SITE_OTHER): Payer: Medicare HMO | Admitting: Family Medicine

## 2022-10-26 DIAGNOSIS — I890 Lymphedema, not elsewhere classified: Secondary | ICD-10-CM | POA: Diagnosis not present

## 2022-10-30 ENCOUNTER — Other Ambulatory Visit: Payer: Self-pay | Admitting: Nurse Practitioner

## 2022-11-02 DIAGNOSIS — M545 Low back pain, unspecified: Secondary | ICD-10-CM | POA: Diagnosis not present

## 2022-11-02 NOTE — Telephone Encounter (Signed)
Requested medications are due for refill today.  Unsure  Requested medications are on the active medications list.  yes  Last refill. 04/30/2022  Future visit scheduled.   yes  Notes to clinic.  Medication is historical.    Requested Prescriptions  Pending Prescriptions Disp Refills   metoprolol tartrate (LOPRESSOR) 25 MG tablet [Pharmacy Med Name: Metoprolol Tartrate 25 MG Oral Tablet] 180 tablet 0    Sig: Take 1 tablet by mouth twice daily     Cardiovascular:  Beta Blockers Passed - 10/30/2022 10:32 AM      Passed - Last BP in normal range    BP Readings from Last 1 Encounters:  10/07/22 121/77         Passed - Last Heart Rate in normal range    Pulse Readings from Last 1 Encounters:  10/07/22 65         Passed - Valid encounter within last 6 months    Recent Outpatient Visits           1 month ago CLL (chronic lymphocytic leukemia) (HCC)   [place] [facility] [place], [place] T, NP   3 months ago CLL (chronic lymphocytic leukemia) (HCC)   [place] [facility] [place], [place] T, NP   3 months ago CLL (chronic lymphocytic leukemia) (HCC)   [place] [facility] [place], Corrie Dandy T, NP   6 months ago CLL (chronic lymphocytic leukemia) (HCC)   [facility] [place], Corrie Dandy T, NP   9 months ago Upper respiratory tract infection, unspecified type   [place] [facility] [place], Megan P, DO       Future Appointments             In 2 months Cannady, Dorie Rank, NP [facility], [facility]

## 2022-11-04 DIAGNOSIS — M545 Low back pain, unspecified: Secondary | ICD-10-CM | POA: Diagnosis not present

## 2022-11-04 DIAGNOSIS — M25559 Pain in unspecified hip: Secondary | ICD-10-CM | POA: Diagnosis not present

## 2022-11-09 ENCOUNTER — Other Ambulatory Visit (HOSPITAL_COMMUNITY): Payer: Self-pay

## 2022-11-16 DIAGNOSIS — M545 Low back pain, unspecified: Secondary | ICD-10-CM | POA: Diagnosis not present

## 2022-11-17 DIAGNOSIS — L720 Epidermal cyst: Secondary | ICD-10-CM | POA: Diagnosis not present

## 2022-11-17 DIAGNOSIS — L218 Other seborrheic dermatitis: Secondary | ICD-10-CM | POA: Diagnosis not present

## 2022-11-17 DIAGNOSIS — R208 Other disturbances of skin sensation: Secondary | ICD-10-CM | POA: Diagnosis not present

## 2022-11-19 DIAGNOSIS — H01001 Unspecified blepharitis right upper eyelid: Secondary | ICD-10-CM | POA: Diagnosis not present

## 2022-11-19 DIAGNOSIS — J301 Allergic rhinitis due to pollen: Secondary | ICD-10-CM | POA: Diagnosis not present

## 2022-11-19 DIAGNOSIS — Z6841 Body Mass Index (BMI) 40.0 and over, adult: Secondary | ICD-10-CM | POA: Diagnosis not present

## 2022-11-22 ENCOUNTER — Telehealth: Payer: Self-pay | Admitting: *Deleted

## 2022-11-22 NOTE — Telephone Encounter (Signed)
Patient called wanting [place] to know that she had been out of town and was without her pills Thurs - Sat and restarted them once she got home Sunday and her medicine was in her mailbox.

## 2022-11-23 DIAGNOSIS — M545 Low back pain, unspecified: Secondary | ICD-10-CM | POA: Diagnosis not present

## 2022-11-26 DIAGNOSIS — I890 Lymphedema, not elsewhere classified: Secondary | ICD-10-CM | POA: Diagnosis not present

## 2022-11-29 ENCOUNTER — Ambulatory Visit (INDEPENDENT_AMBULATORY_CARE_PROVIDER_SITE_OTHER): Payer: Medicare HMO | Admitting: Nurse Practitioner

## 2022-11-29 ENCOUNTER — Encounter: Payer: Self-pay | Admitting: Nurse Practitioner

## 2022-11-29 VITALS — BP 139/76 | HR 67 | Temp 98.7°F | Wt 257.6 lb

## 2022-11-29 DIAGNOSIS — H00012 Hordeolum externum right lower eyelid: Secondary | ICD-10-CM

## 2022-11-29 DIAGNOSIS — T7840XA Allergy, unspecified, initial encounter: Secondary | ICD-10-CM | POA: Diagnosis not present

## 2022-11-29 MED ORDER — ERYTHROMYCIN 5 MG/GM OP OINT: 1.0000 | TOPICAL_OINTMENT | Freq: Three times a day (TID) | OPHTHALMIC | 0 refills | Status: DC

## 2022-11-29 MED ORDER — FLUTICASONE PROPIONATE 50 MCG/ACT NA SUSP: 2.0000 | Freq: Every day | NASAL | 6 refills | Status: DC

## 2022-11-29 NOTE — Progress Notes (Signed)
BP 139/76   Pulse 67   Temp 98.7 F (37.1 C) (Oral)   Wt 257 lb 9.6 oz (116.8 kg)   SpO2 96%   BMI 41.60 kg/m    Subjective:    Patient ID: Erika Anderson, female    DOB: [birth year], 72 y.o.   MRN: [id number]  HPI: [name] is a 72 y.o. female  Chief Complaint  Patient presents with   Eye Problem    Pt states she was seen 11/19/22 and given erythromycin and xyzal for her allergies. States her eye is not getting any better since the visit    UPPER RESPIRATORY TRACT INFECTION Worst symptom: symptoms started on 6/14. She was seen in UC and given zyzal.   Fever: no Cough: no Shortness of breath: no Wheezing: no Chest pain: no Chest tightness: no Chest congestion: no Nasal congestion: yes Runny nose: yes Post nasal drip: no Sneezing: yes Sore throat: no Swollen glands: no Sinus pressure: no Headache: no Face pain: no Toothache: no Ear pain: no bilateral Ear pressure: no bilateral Eyes red/itching:no Eye drainage/crusting: yes  Vomiting: no Rash: no Fatigue: no Sick contacts: no Strep contacts: no  Context: stable Recurrent sinusitis: no Relief with OTC cold/cough medications: yes  Treatments attempted: anti-histamine   Patient states she has a stye on her right eye.  She was treated with erythromycin.  The stye was on the upper lid then moved to the lower lid over the weekend.  She is still using the erythromycin ointment.    Relevant past medical, surgical, family and social history reviewed and updated as indicated. Interim medical history since our last visit reviewed. Allergies and medications reviewed and updated.  Review of Systems  Constitutional:  Negative for fatigue and fever.  HENT:  Positive for congestion, rhinorrhea and sneezing. Negative for dental problem, ear pain, postnasal drip, sinus pressure, sinus pain and sore throat.   Eyes:  Positive for pain (stye) and discharge.  Respiratory:  Negative for cough, shortness of breath  and wheezing.   Cardiovascular:  Negative for chest pain.  Gastrointestinal:  Negative for vomiting.  Skin:  Negative for rash.  Neurological:  Negative for headaches.    Per HPI unless specifically indicated above     Objective:    BP 139/76   Pulse 67   Temp 98.7 F (37.1 C) (Oral)   Wt 257 lb 9.6 oz (116.8 kg)   SpO2 96%   BMI 41.60 kg/m   Wt Readings from Last 3 Encounters:  11/29/22 257 lb 9.6 oz (116.8 kg)  10/07/22 255 lb 8 oz (115.9 kg)  09/21/22 263 lb 14.4 oz (119.7 kg)    Physical Exam Vitals and nursing note reviewed.  Constitutional:      General: She is not in acute distress.    Appearance: Normal appearance. She is normal weight. She is not ill-appearing, toxic-appearing or diaphoretic.  HENT:     Head: Normocephalic.     Right Ear: Tympanic membrane and external ear normal.     Left Ear: Tympanic membrane and external ear normal.     Nose: Congestion and rhinorrhea present.     Right Sinus: No maxillary sinus tenderness or frontal sinus tenderness.     Left Sinus: No maxillary sinus tenderness or frontal sinus tenderness.     Mouth/Throat:     Mouth: Mucous membranes are moist.     Pharynx: Oropharynx is clear. Posterior oropharyngeal erythema present. No oropharyngeal exudate.  Eyes:  General:        Right eye: No discharge.        Left eye: No discharge.     Extraocular Movements: Extraocular movements intact.     Conjunctiva/sclera: Conjunctivae normal.     Pupils: Pupils are equal, round, and reactive to light.   Cardiovascular:     Rate and Rhythm: Normal rate and regular rhythm.     Heart sounds: No murmur heard. Pulmonary:     Effort: Pulmonary effort is normal. No respiratory distress.     Breath sounds: Normal breath sounds. No wheezing or rales.  Musculoskeletal:     Cervical back: Normal range of motion and neck supple.  Skin:    General: Skin is warm and dry.     Capillary Refill: Capillary refill takes less than 2 seconds.   Neurological:     General: No focal deficit present.     Mental Status: She is alert and oriented to person, place, and time. Mental status is at baseline.  Psychiatric:        Mood and Affect: Mood normal.        Behavior: Behavior normal.        Thought Content: Thought content normal.        Judgment: Judgment normal.     Results for orders placed or performed in visit on 10/07/22  Comprehensive metabolic panel  Result Value Ref Range   Sodium 140 135 - 145 mmol/L   Potassium 4.7 3.5 - 5.1 mmol/L   Chloride 102 98 - 111 mmol/L   CO2 30 22 - 32 mmol/L   Glucose, Bld 107 (H) 70 - 99 mg/dL   BUN 24 (H) 8 - 23 mg/dL   Creatinine, Ser 9.60 0.44 - 1.00 mg/dL   Calcium 9.4 8.9 - 45.4 mg/dL   Total Protein 7.0 6.5 - 8.1 g/dL   Albumin 4.1 3.5 - 5.0 g/dL   AST 25 15 - 41 U/L   ALT 24 0 - 44 U/L   Alkaline Phosphatase 72 38 - 126 U/L   Total Bilirubin 0.6 0.3 - 1.2 mg/dL   GFR, Estimated >09 >81 mL/min   Anion gap 8 5 - 15  CBC with Differential/Platelet  Result Value Ref Range   WBC 10.6 (H) 4.0 - 10.5 K/uL   RBC 4.34 3.87 - 5.11 MIL/uL   Hemoglobin 13.3 12.0 - 15.0 g/dL   HCT 19.1 47.8 - 29.5 %   MCV 92.9 80.0 - 100.0 fL   MCH 30.6 26.0 - 34.0 pg   MCHC 33.0 30.0 - 36.0 g/dL   RDW 62.1 30.8 - 65.7 %   Platelets 202 150 - 400 K/uL   nRBC 0.0 0.0 - 0.2 %   Neutrophils Relative % 46 %   Neutro Abs 4.9 1.7 - 7.7 K/uL   Lymphocytes Relative 45 %   Lymphs Abs 4.9 (H) 0.7 - 4.0 K/uL   Monocytes Relative 7 %   Monocytes Absolute 0.7 0.1 - 1.0 K/uL   Eosinophils Relative 1 %   Eosinophils Absolute 0.1 0.0 - 0.5 K/uL   Basophils Relative 1 %   Basophils Absolute 0.1 0.0 - 0.1 K/uL   Immature Granulocytes 0 %   Abs Immature Granulocytes 0.03 0.00 - 0.07 K/uL      Assessment & Plan:   Problem List Items Addressed This Visit   None Visit Diagnoses     Allergic disorder, initial encounter    -  Primary   Continue with zyxal.  Recommend using flonase to help with  symptoms. Follow up if not improved.   Hordeolum externum of right lower eyelid       Complete course of erythromycin.  Refill sent due to patient already having used most of the medication for her upper lid stye. Recommend warm compress.        Follow up plan: Return if symptoms worsen or fail to improve.

## 2022-11-30 ENCOUNTER — Telehealth: Payer: Self-pay | Admitting: *Deleted

## 2022-11-30 NOTE — Progress Notes (Signed)
  Care Coordination   Note   11/30/2022 Name: Anastasija Anfinson MRN: [id number] DOB: 12/20/1950  Florene Brill [place] is a 72 y.o. year old female who sees [place], Corrie Dandy T, NP for primary care. I reached out to [name] by phone today to offer care coordination services.  Ms. Blahnik was given information about Care Coordination services today including:   The Care Coordination services include support from the care team which includes your Nurse Coordinator, Clinical Social Worker, or Pharmacist.  The Care Coordination team is here to help remove barriers to the health concerns and goals most important to you. Care Coordination services are voluntary, and the patient may decline or stop services at any time by request to their care team member.   Care Coordination Consent Status: Patient agreed to services and verbal consent obtained.   Follow up plan:  Telephone appointment with care coordination team member scheduled for:  12/16/2022  Encounter Outcome:  Pt. Scheduled  Burman Nieves, CCMA Care Coordination Care Guide Direct Dial: [phone]

## 2022-12-03 DIAGNOSIS — M545 Low back pain, unspecified: Secondary | ICD-10-CM | POA: Diagnosis not present

## 2022-12-08 ENCOUNTER — Other Ambulatory Visit (HOSPITAL_COMMUNITY): Payer: Self-pay

## 2022-12-14 ENCOUNTER — Other Ambulatory Visit (HOSPITAL_COMMUNITY): Payer: Self-pay

## 2022-12-16 ENCOUNTER — Ambulatory Visit: Payer: Self-pay | Admitting: *Deleted

## 2022-12-16 ENCOUNTER — Encounter: Payer: Self-pay | Admitting: *Deleted

## 2022-12-16 NOTE — Patient Outreach (Signed)
  Care Coordination   Initial Visit Note   12/16/2022 Name: Erika Anderson MRN: [id number] DOB: 05/01/1951  Erika Anderson is a 72 y.o. year old female who sees [place], Corrie Dandy T, NP for primary care. I spoke with  Erika Anderson by phone today.  What matters to the patients health and wellness today?  Remain independent and healthy.  Denies need for follow up at this time, agrees to call this RNCM in the future if needed.     Goals Addressed             This Visit's Progress    COMPLETED: Care Coordination Activities - No follow up needed       Interventions Today    Flowsheet Row Most Recent Value  Chronic Disease   Chronic disease during today's visit Atrial Fibrillation (AFib), Other  [leukemia]  General Interventions   General Interventions Discussed/Reviewed General Interventions Reviewed, Doctor Visits, Health Screening, Durable Medical Equipment (DME)  Doctor Visits Discussed/Reviewed Doctor Visits Reviewed, Annual Wellness Visits, PCP, Specialist  [Cancer center 8/2, PCP 8/8]  Health Screening Bone Density, Colonoscopy, Mammogram  Durable Medical Equipment (DME) BP Cuff  [Monitors blood pressure and heart rate periodically]  PCP/Specialist Visits Compliance with follow-up visit  Exercise Interventions   Exercise Discussed/Reviewed Physical Activity  Physical Activity Discussed/Reviewed Physical Activity Reviewed  [Gym and lots of yard work for activity]  Education Interventions   Education Provided Provided Education  Provided Engineer, petroleum On Medication, When to see the doctor, Nutrition  Nutrition Interventions   Nutrition Discussed/Reviewed Adding fruits and vegetables, Nutrition Reviewed, Decreasing salt, Decreasing fats              SDOH assessments and interventions completed:  Yes  SDOH Interventions Today    Flowsheet Row Most Recent Value  SDOH Interventions   Food Insecurity Interventions Intervention Not Indicated  Housing  Interventions Intervention Not Indicated  Transportation Interventions Intervention Not Indicated  Utilities Interventions Intervention Not Indicated        Care Coordination Interventions:  Yes, provided   Follow up plan: No further intervention required.   Encounter Outcome:  Pt. Visit Completed   Kemper Durie, RN, MSN, [facility] Care Management Care Management Coordinator [phone]

## 2022-12-26 DIAGNOSIS — I890 Lymphedema, not elsewhere classified: Secondary | ICD-10-CM | POA: Diagnosis not present

## 2022-12-30 DIAGNOSIS — M25559 Pain in unspecified hip: Secondary | ICD-10-CM | POA: Diagnosis not present

## 2022-12-30 DIAGNOSIS — M545 Low back pain, unspecified: Secondary | ICD-10-CM | POA: Diagnosis not present

## 2023-01-06 NOTE — Patient Instructions (Addendum)
[facility] and [facility] -- tell them you have atrial fibrillation, aortic atherosclerosis, and high cholesterol, needing to lose some weight to benefit heart health  Be Involved in Caring For Your Health:  Taking Medications When medications are taken as directed, they can greatly improve your health. But if they are not taken as prescribed, they may not work. In some cases, not taking them correctly can be harmful. To help ensure your treatment remains effective and safe, understand your medications and how to take them. Bring your medications to each visit for review by your provider.  Your lab results, notes, and after visit summary will be available on My Chart. We strongly encourage you to use this feature. If lab results are abnormal the clinic will contact you with the appropriate steps. If the clinic does not contact you assume the results are satisfactory. You can always view your results on My Chart. If you have questions regarding your health or results, please contact the clinic during office hours. You can also ask questions on My Chart.  We at [facility] are grateful that you chose [place] to provide your care. We strive to provide evidence-based and compassionate care and are always looking for feedback. If you get a survey from the clinic please complete this so we can hear your opinions.  Heart-Healthy Eating Plan Many factors influence your heart health, including eating and exercise habits. Heart health is also called coronary health. Coronary risk increases with abnormal blood fat (lipid) levels. A heart-healthy eating plan includes limiting unhealthy fats, increasing healthy fats, limiting salt (sodium) intake, and making other diet and lifestyle changes. What is my plan? Your health care provider may recommend that: You limit your fat intake to _________% or less of your total calories each day. You limit your saturated fat intake to _________% or less of your total  calories each day. You limit the amount of cholesterol in your diet to less than _________ mg per day. You limit the amount of sodium in your diet to less than _________ mg per day. What are tips for following this plan? Cooking Cook foods using methods other than frying. Baking, boiling, grilling, and broiling are all good options. Other ways to reduce fat include: Removing the skin from poultry. Removing all visible fats from meats. Steaming vegetables in water or broth. Meal planning  At meals, imagine dividing your plate into fourths: Fill one-half of your plate with vegetables and green salads. Fill one-fourth of your plate with whole grains. Fill one-fourth of your plate with lean protein foods. Eat 2-4 cups of vegetables per day. One cup of vegetables equals 1 cup (91 g) broccoli or cauliflower florets, 2 medium carrots, 1 large bell pepper, 1 large sweet potato, 1 large tomato, 1 medium white potato, 2 cups (150 g) raw leafy greens. Eat 1-2 cups of fruit per day. One cup of fruit equals 1 small apple, 1 large banana, 1 cup (237 g) mixed fruit, 1 large orange,  cup (82 g) dried fruit, 1 cup (240 mL) 100% fruit juice. Eat more foods that contain soluble fiber. Examples include apples, broccoli, carrots, beans, peas, and barley. Aim to get 25-30 g of fiber per day. Increase your consumption of legumes, nuts, and seeds to 4-5 servings per week. One serving of dried beans or legumes equals  cup (90 g) cooked, 1 serving of nuts is  oz (12 almonds, 24 pistachios, or 7 walnut halves), and 1 serving of seeds equals  oz (8 g).  Fats Choose healthy fats more often. Choose monounsaturated and polyunsaturated fats, such as olive and canola oils, avocado oil, flaxseeds, walnuts, almonds, and seeds. Eat more omega-3 fats. Choose salmon, mackerel, sardines, tuna, flaxseed oil, and ground flaxseeds. Aim to eat fish at least 2 times each week. Check food labels carefully to identify foods with  trans fats or high amounts of saturated fat. Limit saturated fats. These are found in animal products, such as meats, butter, and cream. Plant sources of saturated fats include palm oil, palm kernel oil, and coconut oil. Avoid foods with partially hydrogenated oils in them. These contain trans fats. Examples are stick margarine, some tub margarines, cookies, crackers, and other baked goods. Avoid fried foods. General information Eat more home-cooked food and less restaurant, buffet, and fast food. Limit or avoid alcohol. Limit foods that are high in added sugar and simple starches such as foods made using white refined flour (white breads, pastries, sweets). Lose weight if you are overweight. Losing just 5-10% of your body weight can help your overall health and prevent diseases such as diabetes and heart disease. Monitor your sodium intake, especially if you have high blood pressure. Talk with your health care provider about your sodium intake. Try to incorporate more vegetarian meals weekly. What foods should I eat? Fruits All fresh, canned (in natural juice), or frozen fruits. Vegetables Fresh or frozen vegetables (raw, steamed, roasted, or grilled). Green salads. Grains Most grains. Choose whole wheat and whole grains most of the time. Rice and pasta, including brown rice and pastas made with whole wheat. Meats and other proteins Lean, well-trimmed beef, veal, pork, and lamb. Chicken and [place] without skin. All fish and shellfish. Wild duck, rabbit, pheasant, and venison. Egg whites or low-cholesterol egg substitutes. Dried beans, peas, lentils, and tofu. Seeds and most nuts. Dairy Low-fat or nonfat cheeses, including ricotta and mozzarella. Skim or 1% milk (liquid, powdered, or evaporated). Buttermilk made with low-fat milk. Nonfat or low-fat yogurt. Fats and oils Non-hydrogenated (trans-free) margarines. Vegetable oils, including soybean, sesame, sunflower, olive, avocado, peanut,  safflower, corn, canola, and cottonseed. Salad dressings or mayonnaise made with a vegetable oil. Beverages Water (mineral or sparkling). Coffee and tea. Unsweetened ice tea. Diet beverages. Sweets and desserts Sherbet, gelatin, and fruit ice. Small amounts of dark chocolate. Limit all sweets and desserts. Seasonings and condiments All seasonings and condiments. The items listed above may not be a complete list of foods and beverages you can eat. Contact a dietitian for more options. What foods should I avoid? Fruits Canned fruit in heavy syrup. Fruit in cream or butter sauce. Fried fruit. Limit coconut. Vegetables Vegetables cooked in cheese, cream, or butter sauce. Fried vegetables. Grains Breads made with saturated or trans fats, oils, or whole milk. Croissants. Sweet rolls. Donuts. High-fat crackers, such as cheese crackers and chips. Meats and other proteins Fatty meats, such as hot dogs, ribs, sausage, bacon, rib-eye roast or steak. High-fat deli meats, such as salami and bologna. Caviar. Domestic duck and goose. Organ meats, such as liver. Dairy Cream, sour cream, cream cheese, and creamed cottage cheese. Whole-milk cheeses. Whole or 2% milk (liquid, evaporated, or condensed). Whole buttermilk. Cream sauce or high-fat cheese sauce. Whole-milk yogurt. Fats and oils Meat fat, or shortening. Cocoa butter, hydrogenated oils, palm oil, coconut oil, palm kernel oil. Solid fats and shortenings, including bacon fat, salt pork, lard, and butter. Nondairy cream substitutes. Salad dressings with cheese or sour cream. Beverages Regular sodas and any drinks with added sugar. Sweets and  desserts Frosting. Pudding. Cookies. Cakes. Pies. Milk chocolate or white chocolate. Buttered syrups. Full-fat ice cream or ice cream drinks. The items listed above may not be a complete list of foods and beverages to avoid. Contact a dietitian for more information. Summary Heart-healthy meal planning includes  limiting unhealthy fats, increasing healthy fats, limiting salt (sodium) intake and making other diet and lifestyle changes. Lose weight if you are overweight. Losing just 5-10% of your body weight can help your overall health and prevent diseases such as diabetes and heart disease. Focus on eating a balance of foods, including fruits and vegetables, low-fat or nonfat dairy, lean protein, nuts and legumes, whole grains, and heart-healthy oils and fats. This information is not intended to replace advice given to you by your health care provider. Make sure you discuss any questions you have with your health care provider. Document Revised: 06/29/2021 Document Reviewed: 06/29/2021 Elsevier Patient Education  2024 [employer].

## 2023-01-07 ENCOUNTER — Encounter: Payer: Self-pay | Admitting: Oncology

## 2023-01-07 ENCOUNTER — Inpatient Hospital Stay: Payer: Medicare HMO | Attending: Oncology

## 2023-01-07 ENCOUNTER — Other Ambulatory Visit: Payer: Self-pay

## 2023-01-07 ENCOUNTER — Inpatient Hospital Stay (HOSPITAL_BASED_OUTPATIENT_CLINIC_OR_DEPARTMENT_OTHER): Payer: Medicare HMO | Admitting: Oncology

## 2023-01-07 VITALS — BP 129/61 | HR 57 | Temp 97.3°F | Resp 18 | Wt 263.4 lb

## 2023-01-07 DIAGNOSIS — Z79899 Other long term (current) drug therapy: Secondary | ICD-10-CM | POA: Diagnosis not present

## 2023-01-07 DIAGNOSIS — R635 Abnormal weight gain: Secondary | ICD-10-CM | POA: Insufficient documentation

## 2023-01-07 DIAGNOSIS — C9110 Chronic lymphocytic leukemia of B-cell type not having achieved remission: Secondary | ICD-10-CM | POA: Diagnosis not present

## 2023-01-07 DIAGNOSIS — Z1502 Genetic susceptibility to malignant neoplasm of ovary: Secondary | ICD-10-CM | POA: Diagnosis not present

## 2023-01-07 DIAGNOSIS — G479 Sleep disorder, unspecified: Secondary | ICD-10-CM | POA: Insufficient documentation

## 2023-01-07 DIAGNOSIS — E669 Obesity, unspecified: Secondary | ICD-10-CM | POA: Diagnosis not present

## 2023-01-07 DIAGNOSIS — Z90710 Acquired absence of both cervix and uterus: Secondary | ICD-10-CM | POA: Insufficient documentation

## 2023-01-07 DIAGNOSIS — Z7901 Long term (current) use of anticoagulants: Secondary | ICD-10-CM | POA: Insufficient documentation

## 2023-01-07 DIAGNOSIS — Z823 Family history of stroke: Secondary | ICD-10-CM | POA: Insufficient documentation

## 2023-01-07 DIAGNOSIS — Z8249 Family history of ischemic heart disease and other diseases of the circulatory system: Secondary | ICD-10-CM | POA: Insufficient documentation

## 2023-01-07 DIAGNOSIS — G2581 Restless legs syndrome: Secondary | ICD-10-CM | POA: Diagnosis not present

## 2023-01-07 DIAGNOSIS — Z801 Family history of malignant neoplasm of trachea, bronchus and lung: Secondary | ICD-10-CM | POA: Diagnosis not present

## 2023-01-07 DIAGNOSIS — Z1501 Genetic susceptibility to malignant neoplasm of breast: Secondary | ICD-10-CM | POA: Diagnosis not present

## 2023-01-07 DIAGNOSIS — M25569 Pain in unspecified knee: Secondary | ICD-10-CM | POA: Diagnosis not present

## 2023-01-07 DIAGNOSIS — I4891 Unspecified atrial fibrillation: Secondary | ICD-10-CM | POA: Insufficient documentation

## 2023-01-07 DIAGNOSIS — Z818 Family history of other mental and behavioral disorders: Secondary | ICD-10-CM | POA: Insufficient documentation

## 2023-01-07 DIAGNOSIS — Z5111 Encounter for antineoplastic chemotherapy: Secondary | ICD-10-CM

## 2023-01-07 DIAGNOSIS — Z8041 Family history of malignant neoplasm of ovary: Secondary | ICD-10-CM | POA: Insufficient documentation

## 2023-01-07 DIAGNOSIS — Z1509 Genetic susceptibility to other malignant neoplasm: Secondary | ICD-10-CM | POA: Diagnosis not present

## 2023-01-07 DIAGNOSIS — E785 Hyperlipidemia, unspecified: Secondary | ICD-10-CM | POA: Insufficient documentation

## 2023-01-07 DIAGNOSIS — Z8261 Family history of arthritis: Secondary | ICD-10-CM | POA: Diagnosis not present

## 2023-01-07 LAB — CBC WITH DIFFERENTIAL (CANCER CENTER ONLY)
Abs Immature Granulocytes: 0.02 10*3/uL (ref 0.00–0.07)
Basophils Absolute: 0.1 10*3/uL (ref 0.0–0.1)
Basophils Relative: 1 %
Eosinophils Absolute: 0.1 10*3/uL (ref 0.0–0.5)
Eosinophils Relative: 2 %
HCT: 37.6 % (ref 36.0–46.0)
Hemoglobin: 12.6 g/dL (ref 12.0–15.0)
Immature Granulocytes: 0 %
Lymphocytes Relative: 40 %
Lymphs Abs: 3.3 10*3/uL (ref 0.7–4.0)
MCH: 30.9 pg (ref 26.0–34.0)
MCHC: 33.5 g/dL (ref 30.0–36.0)
MCV: 92.2 fL (ref 80.0–100.0)
Monocytes Absolute: 0.7 10*3/uL (ref 0.1–1.0)
Monocytes Relative: 8 %
Neutro Abs: 4 10*3/uL (ref 1.7–7.7)
Neutrophils Relative %: 49 %
Platelet Count: 192 10*3/uL (ref 150–400)
RBC: 4.08 MIL/uL (ref 3.87–5.11)
RDW: 12.7 % (ref 11.5–15.5)
WBC Count: 8.2 10*3/uL (ref 4.0–10.5)
nRBC: 0 % (ref 0.0–0.2)

## 2023-01-07 LAB — CMP (CANCER CENTER ONLY)
ALT: 20 U/L (ref 0–44)
AST: 22 U/L (ref 15–41)
Albumin: 3.9 g/dL (ref 3.5–5.0)
Alkaline Phosphatase: 65 U/L (ref 38–126)
Anion gap: 8 (ref 5–15)
BUN: 15 mg/dL (ref 8–23)
CO2: 28 mmol/L (ref 22–32)
Calcium: 9.3 mg/dL (ref 8.9–10.3)
Chloride: 104 mmol/L (ref 98–111)
Creatinine: 0.96 mg/dL (ref 0.44–1.00)
GFR, Estimated: >60 mL/min (ref >60)
Glucose, Bld: 110 mg/dL — ABNORMAL HIGH (ref 70–99)
Potassium: 4.6 mmol/L (ref 3.5–5.1)
Sodium: 140 mmol/L (ref 135–145)
Total Bilirubin: 0.9 mg/dL (ref 0.3–1.2)
Total Protein: 6.9 g/dL (ref 6.5–8.1)

## 2023-01-07 NOTE — Progress Notes (Signed)
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 Rapid lymphocyte increment, 50% over 2 months period She is on Elqiuis, will watch for possible increase risk of bleeding.  Labs are reviewed and discussed with patient. Continue Zanubrutinib 160mg  BID, clinically she tolerates well.  For future elective surgery, recommend patient to hold off Zanubrutinib 3-5 days prior to procedure and resume after   RLS (restless legs syndrome) Iron panel showed adequate iron store Continue to monitor.  Encounter for antineoplastic chemotherapy Continue Zanubrutinib 160mg  BID    Follow up 3 months  [name], MD, PhD [facility] Hematology Oncology 01/07/2023       HISTORY OF PRESENTING ILLNESS:  Erika Anderson 72 y.o. female presents to follow up with CLL. She was found to have abnormal CBC on 03/01/22, total white count of 15, predominantly lymphocytosis. This is chronic since Feb 2023. Peripheral blood smear + smudge cells.  + Restless leg syndrome, sleep disturbance for 1 year.  + Family history of ovarian cancer -mother.    Oncology History  CLL (chronic lymphocytic leukemia) (HCC)  03/02/2022 Initial Diagnosis   CLL (chronic lymphocytic leukemia)  -03/15/22 Flowcytometry showed chronic lymphocytic leukemia, B cell, CD38 expression indeterminate for prognosis (23%) and CD20 positive.  45% nuclei positive for TP53 gene  deletion.    03/26/2022 Imaging   US abdomen 1. No sonographic etiology for night sweats identified. 2. Spleen is at the upper limits of normal in size    04/01/2022 Cancer Staging   Staging form: Chronic Lymphocytic Leukemia / Small Lymphocytic Lymphoma, AJCC 8th Edition - Clinical: Modified Rai Stage 0 (Modified Rai risk: Low, Lymphocytosis: Present, Adenopathy: Absent, Organomegaly: Absent, Anemia: Absent, Thrombocytopenia: Absent) - Signed by [name], MD on 04/01/2022 Stage prefix: Initial diagnosis Absolute lymphocyte count (ALC) (cells/uL): 18   04/19/2022 -  Chemotherapy   Started on Zanubrutinib 160mg  BID      She reports tolerating Zanubrutinib 160mg  BID well. Night sweat has resolved Gained weight.   Appetite is fair. + Knee pain, there is plan for physical therapy and a possible future knee replacement.   MEDICAL HISTORY:  Past Medical History:  Diagnosis Date   Atrial fibrillation (HCC)    Hyperlipidemia    Leukemia (HCC)    Obesity (BMI 30-39.9)    Restless legs syndrome (RLS) 12/28/2014   RLS (restless legs syndrome)     SURGICAL HISTORY: Past Surgical History:  Procedure Laterality Date   ATRIAL FIBRILLATION ABLATION N/A 03/22/2022   Procedure: ATRIAL FIBRILLATION ABLATION;  Surgeon: Lanier Prude, MD;  Location: [facility] INVASIVE CV LAB;  Service: Cardiovascular;  Laterality: N/A;   COLONOSCOPY WITH PROPOFOL N/A 08/24/2021   Procedure: COLONOSCOPY WITH PROPOFOL;  Surgeon: Wyline Mood, MD;  Location: [facility] ENDOSCOPY;  Service: Gastroenterology;  Laterality: N/A;   HEMORROIDECTOMY     TOTAL ABDOMINAL HYSTERECTOMY     ovaries remain    SOCIAL HISTORY: Social History   Socioeconomic History   Marital status: Married    Spouse name: Not on file   Number of children: Not on file  Years of education: 40   Highest education level: High school graduate  Occupational History   Occupation: retired  Tobacco Use   Smoking status: Never   Smokeless  tobacco: Never   Tobacco comments:    Never smoke 04/19/22  Vaping Use   Vaping status: Never Used  Substance and Sexual Activity   Alcohol use: No    Alcohol/week: 0.0 standard drinks of alcohol   Drug use: No   Sexual activity: Not Currently  Other Topics Concern   Not on file  Social History Narrative   Helps take care of grandchildren    Social Determinants of Health   Financial Resource Strain: Low Risk  (03/02/2022)   Overall Financial Resource Strain (CARDIA)    Difficulty of Paying Living Expenses: Not hard at all  Food Insecurity: No Food Insecurity (12/16/2022)   Hunger Vital Sign    Worried About Running Out of Food in the Last Year: Never true    Ran Out of Food in the Last Year: Never true  Transportation Needs: No Transportation Needs (12/16/2022)   PRAPARE - Administrator, Civil Service (Medical): No    Lack of Transportation (Non-Medical): No  Physical Activity: Inactive (03/02/2022)   Exercise Vital Sign    Days of Exercise per Week: 0 days    Minutes of Exercise per Session: 0 min  Stress: No Stress Concern Present (03/02/2022)   [employer] of Occupational Health - Occupational Stress Questionnaire    Feeling of Stress : Not at all  Social Connections: Socially Integrated (03/02/2022)   Social Connection and Isolation Panel [NHANES]    Frequency of Communication with Friends and Family: More than three times a week    Frequency of Social Gatherings with Friends and Family: Twice a week    Attends Religious Services: More than 4 times per year    Active Member of [employer] or Organizations: Yes    Attends Engineer, structural: More than 4 times per year    Marital Status: Married  Catering manager Violence: Not At Risk (03/02/2022)   Humiliation, Afraid, Rape, and Kick questionnaire    Fear of Current or Ex-Partner: No    Emotionally Abused: No    Physically Abused: No    Sexually Abused: No    FAMILY HISTORY: Family History   Problem Relation Age of Onset   Cancer Mother        ovarian   Dementia Father    Arthritis Sister    Heart disease Brother        MI   Stroke Paternal Grandmother    Cancer Paternal Grandfather        lung   Breast cancer Neg Hx     ALLERGIES:  has No Known Allergies.  MEDICATIONS:  Current Outpatient Medications  Medication Sig Dispense Refill   apixaban (ELIQUIS) 5 MG TABS tablet Take 1 tablet by mouth twice daily 180 tablet 1   betamethasone dipropionate 0.05 % lotion Apply topically daily.     citalopram (CELEXA) 20 MG tablet Take 1 tablet by mouth once daily 90 tablet 1   fluconazole (DIFLUCAN) 200 MG tablet Take 200 mg by mouth once a week.     gabapentin (NEURONTIN) 300 MG capsule Take 2 capsules (600 mg total) by mouth at bedtime. (Patient taking differently: Take 300 mg by mouth at bedtime as needed (pain).) 180 capsule 4   ketoconazole (NIZORAL) 2 % shampoo Apply topically 3 (three) times a week.  metoprolol tartrate (LOPRESSOR) 25 MG tablet Take 1 tablet by mouth twice daily 180 tablet 4   Multiple Vitamins-Minerals (MULTIVITAMIN WITH MINERALS) tablet Take 1 tablet by mouth daily.     pantoprazole (PROTONIX) 40 MG tablet Take 1 tablet (40 mg total) by mouth daily. 90 tablet 3   rOPINIRole (REQUIP) 1 MG tablet Take 1 tablet by mouth 4 times daily 360 tablet 4   rosuvastatin (CRESTOR) 20 MG tablet Take 1 tablet (20 mg total) by mouth daily. 90 tablet 4   traZODone (DESYREL) 50 MG tablet Take 0.5 tablets by mouth at bedtime.     zanubrutinib (BRUKINSA) 80 MG capsule Take 2 capsules (160 mg total) by mouth 2 (two) times daily. 120 capsule 3   levocetirizine (XYZAL) 5 MG tablet Take 5 mg by mouth every evening.     No current facility-administered medications for this visit.    Review of Systems  Constitutional:  Negative for appetite change, chills, fatigue and fever.  HENT:   Negative for hearing loss and voice change.   Eyes:  Negative for eye problems.   Respiratory:  Negative for chest tightness and cough.   Cardiovascular:  Negative for chest pain.  Gastrointestinal:  Negative for abdominal distention, abdominal pain and blood in stool.  Endocrine: Negative for hot flashes.       Night sweat  Genitourinary:  Negative for difficulty urinating and frequency.   Musculoskeletal:  Negative for arthralgias.  Skin:  Negative for itching and rash.  Neurological:  Negative for extremity weakness.  Hematological:  Negative for adenopathy.  Psychiatric/Behavioral:  Positive for sleep disturbance. Negative for confusion.      PHYSICAL EXAMINATION: ECOG PERFORMANCE STATUS: 1 - Symptomatic but completely ambulatory  Vitals:   01/07/23 0958  BP: 129/61  Pulse: (!) 57  Resp: 18  Temp: (!) 97.3 F (36.3 C)   Filed Weights   01/07/23 0958  Weight: 263 lb 6.4 oz (119.5 kg)    Physical Exam HENT:     Head: Normocephalic.     Nose: Nose normal.     Mouth/Throat:     Pharynx: No oropharyngeal exudate.  Eyes:     General: No scleral icterus.    Pupils: Pupils are equal, round, and reactive to light.  Cardiovascular:     Rate and Rhythm: Normal rate.  Pulmonary:     Effort: Pulmonary effort is normal. No respiratory distress.     Breath sounds: No wheezing.  Abdominal:     General: There is no distension.     Palpations: Abdomen is soft.  Musculoskeletal:        General: Normal range of motion.     Cervical back: Normal range of motion.  Skin:    General: Skin is warm and dry.     Findings: No erythema.  Neurological:     Mental Status: She is alert and oriented to person, place, and time. Mental status is at baseline.     Cranial Nerves: No cranial nerve deficit.     Motor: No abnormal muscle tone.  Psychiatric:        Mood and Affect: Affect normal.     LABORATORY DATA:  I have reviewed the data as listed    Latest Ref Rng & Units 01/07/2023    9:41 AM 10/07/2022   10:34 AM 07/08/2022   10:04 AM  CBC  WBC 4.0 - 10.5 K/uL  8.2  10.6  21.5   Hemoglobin 12.0 - 15.0 g/dL 16.1  13.3  13.4   Hematocrit 36.0 - 46.0 % 37.6  40.3  40.1   Platelets 150 - 400 K/uL 192  202  198       Latest Ref Rng & Units 01/07/2023    9:41 AM 10/07/2022   10:34 AM 07/15/2022   12:00 AM  CMP  Glucose 70 - 99 mg/dL 161  096  86   BUN 8 - 23 mg/dL 15  24  13    Creatinine 0.44 - 1.00 mg/dL 0.45  4.09  8.11   Sodium 135 - 145 mmol/L 140  140  144   Potassium 3.5 - 5.1 mmol/L 4.6  4.7  3.7   Chloride 98 - 111 mmol/L 104  102  104   CO2 22 - 32 mmol/L 28  30  26    Calcium 8.9 - 10.3 mg/dL 9.3  9.4  9.4   Total Protein 6.5 - 8.1 g/dL 6.9  7.0  6.6   Total Bilirubin 0.3 - 1.2 mg/dL 0.9  0.6  0.4   Alkaline Phos 38 - 126 U/L 65  72  99   AST 15 - 41 U/L 22  25  17    ALT 0 - 44 U/L 20  24  19     Lab Results  Component Value Date   LDH 192 03/15/2022    RADIOGRAPHIC STUDIES: I have personally reviewed the radiological images as listed and agreed with the findings in the report. No results found.

## 2023-01-07 NOTE — Assessment & Plan Note (Signed)
Continue Zanubrutinib '160mg'$  BID

## 2023-01-07 NOTE — Assessment & Plan Note (Signed)
Iron panel showed adequate iron store Continue to monitor.

## 2023-01-07 NOTE — Assessment & Plan Note (Signed)
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 Rapid lymphocyte increment, 50% over 2 months period She is on Elqiuis, will watch for possible increase risk of bleeding.  Labs are reviewed and discussed with patient. Continue Zanubrutinib 160mg  BID, clinically she tolerates well.  For future elective surgery, recommend patient to hold off Zanubrutinib 3-5 days prior to procedure and resume after

## 2023-01-11 ENCOUNTER — Other Ambulatory Visit: Payer: Self-pay | Admitting: Nurse Practitioner

## 2023-01-11 ENCOUNTER — Encounter: Payer: Self-pay | Admitting: Nurse Practitioner

## 2023-01-11 DIAGNOSIS — Z1231 Encounter for screening mammogram for malignant neoplasm of breast: Secondary | ICD-10-CM

## 2023-01-13 ENCOUNTER — Encounter: Payer: Self-pay | Admitting: Nurse Practitioner

## 2023-01-13 ENCOUNTER — Ambulatory Visit (INDEPENDENT_AMBULATORY_CARE_PROVIDER_SITE_OTHER): Payer: Medicare HMO | Admitting: Nurse Practitioner

## 2023-01-13 VITALS — BP 118/68 | HR 58 | Temp 98.1°F | Ht 66.0 in | Wt 267.2 lb

## 2023-01-13 DIAGNOSIS — I4819 Other persistent atrial fibrillation: Secondary | ICD-10-CM | POA: Diagnosis not present

## 2023-01-13 DIAGNOSIS — C9110 Chronic lymphocytic leukemia of B-cell type not having achieved remission: Secondary | ICD-10-CM

## 2023-01-13 DIAGNOSIS — Z6841 Body Mass Index (BMI) 40.0 and over, adult: Secondary | ICD-10-CM

## 2023-01-13 DIAGNOSIS — E782 Mixed hyperlipidemia: Secondary | ICD-10-CM | POA: Diagnosis not present

## 2023-01-13 DIAGNOSIS — F5104 Psychophysiologic insomnia: Secondary | ICD-10-CM

## 2023-01-13 DIAGNOSIS — I480 Paroxysmal atrial fibrillation: Secondary | ICD-10-CM | POA: Diagnosis not present

## 2023-01-13 DIAGNOSIS — I890 Lymphedema, not elsewhere classified: Secondary | ICD-10-CM | POA: Diagnosis not present

## 2023-01-13 DIAGNOSIS — G2581 Restless legs syndrome: Secondary | ICD-10-CM | POA: Diagnosis not present

## 2023-01-13 DIAGNOSIS — R7301 Impaired fasting glucose: Secondary | ICD-10-CM | POA: Diagnosis not present

## 2023-01-13 DIAGNOSIS — M170 Bilateral primary osteoarthritis of knee: Secondary | ICD-10-CM | POA: Diagnosis not present

## 2023-01-13 DIAGNOSIS — D6869 Other thrombophilia: Secondary | ICD-10-CM

## 2023-01-13 DIAGNOSIS — I700 Atherosclerosis of aorta: Secondary | ICD-10-CM

## 2023-01-13 NOTE — Assessment & Plan Note (Signed)
Chronic, ongoing, on Eliquis.  Continue to monitor.  CBC up to date with oncology.

## 2023-01-13 NOTE — Assessment & Plan Note (Signed)
Chronic and stable with Requip and Gabapentin, continue this regimen and adjust as needed.  Monitor kidney function closely with medications.  Labs today: CMP.

## 2023-01-13 NOTE — Assessment & Plan Note (Signed)
BMI 43.13.  Recommended eating smaller high protein, low fat meals more frequently and exercising 30 mins a day 5 times a week with a goal of 10-15lb weight loss in the next 3 months. Patient voiced their understanding and motivation to adhere to these recommendations.  Referral to weight management.  Would benefit from a GLP1 in future as needs to lose 20 pounds for knee surgery, but unsure insurance will cover this for her -- provided her info to check on this.

## 2023-01-13 NOTE — Assessment & Plan Note (Signed)
Chronic, on treatment.  Diagnosed on 04/01/22.  Continue collaboration with oncology, recent notes reviewed.

## 2023-01-13 NOTE — Assessment & Plan Note (Signed)
Chronic, stable.  Continue Trazodone 1/2 a pill in evening as needed and continue to cut back.  Do not take unless needed.  She wishes to continue minimal use.  Discussed mouth appliances for sleep aides.

## 2023-01-13 NOTE — Progress Notes (Signed)
BP 118/68 (BP Location: Left Arm, Patient Position: Sitting, Cuff Size: Large)   Pulse (!) 58   Temp 98.1 F (36.7 C) (Oral)   Ht 5\' 6"  (1.676 m)   Wt 267 lb 3.2 oz (121.2 kg)   SpO2 97%   BMI 43.13 kg/m    Subjective:    Patient ID: Erika Anderson, female    DOB: 06/29/1950, 72 y.o.   MRN: [id number]  HPI: Erika Anderson is a 72 y.o. female  Chief Complaint  Patient presents with   Atrial Fibrillation   Cancer   Hyperlipidemia   Insomnia   ATRIAL FIBRILLATION & CLL Currently on Metoprolol and Eliquis.  Had visit with cardiology on 06/23/22, to return annually.  Had ablations performed on 03/22/22 and 05/22/22.     CLL diagnosed on 04/01/22 with elevations noted on CBC.  Is followed by Dr. Cathie Hoops.  Last oncology visit was 01/07/23.  Taking Zanubrutinib 160 MG BID.   Atrial fibrillation status: stable Satisfied with current treatment: yes  Medication side effects:  no Medication compliance: good compliance Etiology of atrial fibrillation: unknown Palpitations: no Chest pain: no Dyspnea on exertion: occasional Orthopnea: no Syncope:  no Edema: using leg pumps which helps -- lymphedema Ventricular rate control: B-blocker Anti-coagulation: long acting    HYPERLIPIDEMIA Continues on Rosuvastatin.  Recent elevation in glucose noted on labs. Satisfied with current treatment? yes Duration of hyperlipidemia: chronic Cholesterol medication side effects: no Cholesterol supplements: none Medication compliance: good compliance Aspirin: no Recent stressors: no Recurrent headaches: no Visual changes: no Palpitations: no Dyspnea: occasional Chest pain: no Lower extremity edema: using leg pumps which helps -- lymphedema Dizzy/lightheaded: no  The 10-year ASCVD risk score (Arnett DK, et al., 2019) is: 11.5%   Values used to calculate the score:     Age: 8 years     Sex: Female     Is Non-Hispanic African American: No     Diabetic: No     Tobacco smoker: No      Systolic Blood Pressure: 118 mmHg     Is BP treated: Yes     HDL Cholesterol: 61 mg/dL     Total Cholesterol: 180 mg/dL  RESTLESS LEGS Continues on Requip 1 MG QID (on max dose).    Takes Gabapentin 300 MG at night for back pain PRN.  Had visit with pulmonary on 02/26/22 --  she did attend sleep study, nothing was found as was not sleeping well.  They recommended using a sleep aid.  Takes Trazodone 1/2 tablet every night -- at times makes her too groggy.   Duration: chronic Discomfort description:  ill-defined Pain: no Location: lower legs Bilateral: yes Symmetric: yes Severity: mild Onset:  gradual Frequency:  intermittent Symptoms only occur while legs at rest: yes Sudden unintentional leg jerking: yes Bed partner bothered by leg movements: no LE numbness: no Decreased sensation: no Weakness: no Insomnia: yes Daytime somnolence: no Fatigue: no Alleviating factors: Requip Aggravating factors: rest Status: stable Treatments attempted: as above noted  Relevant past medical, surgical, family and social history reviewed and updated as indicated. Interim medical history since our last visit reviewed. Allergies and medications reviewed and updated.  Review of Systems  Constitutional:  Negative for activity change, appetite change, diaphoresis, fatigue and fever.  Respiratory:  Negative for cough, chest tightness, shortness of breath and wheezing.   Cardiovascular:  Negative for chest pain, palpitations and leg swelling.  Gastrointestinal: Negative.   Musculoskeletal:  Positive for arthralgias.  Neurological: Negative.  Psychiatric/Behavioral: Negative.     Per HPI unless specifically indicated above     Objective:    BP 118/68 (BP Location: Left Arm, Patient Position: Sitting, Cuff Size: Large)   Pulse (!) 58   Temp 98.1 F (36.7 C) (Oral)   Ht 5\' 6"  (1.676 m)   Wt 267 lb 3.2 oz (121.2 kg)   SpO2 97%   BMI 43.13 kg/m   Wt Readings from Last 3 Encounters:   01/13/23 267 lb 3.2 oz (121.2 kg)  01/07/23 263 lb 6.4 oz (119.5 kg)  11/29/22 257 lb 9.6 oz (116.8 kg)    Physical Exam Vitals and nursing note reviewed.  Constitutional:      General: She is awake. She is not in acute distress.    Appearance: She is well-developed and well-groomed. She is obese. She is not ill-appearing or toxic-appearing.  HENT:     Head: Normocephalic.     Right Ear: Hearing normal.     Left Ear: Hearing normal.  Eyes:     General: Lids are normal.        Right eye: No discharge.        Left eye: No discharge.     Conjunctiva/sclera: Conjunctivae normal.     Pupils: Pupils are equal, round, and reactive to light.  Neck:     Thyroid: No thyromegaly.     Vascular: No carotid bruit.  Cardiovascular:     Rate and Rhythm: Normal rate and regular rhythm.     Heart sounds: Normal heart sounds. No murmur heard.    No gallop. No S3 or S4 sounds.     Comments: Compression on. Pulmonary:     Effort: Pulmonary effort is normal. No accessory muscle usage or respiratory distress.     Breath sounds: Normal breath sounds.  Abdominal:     General: Bowel sounds are normal. There is no distension.     Palpations: Abdomen is soft.     Tenderness: There is no abdominal tenderness.  Musculoskeletal:     Cervical back: Normal range of motion and neck supple.     Right lower leg: 1+ Edema present.     Left lower leg: 1+ Edema present.  Lymphadenopathy:     Cervical: No cervical adenopathy.  Skin:    General: Skin is warm and dry.  Neurological:     Mental Status: She is alert and oriented to person, place, and time.  Psychiatric:        Attention and Perception: Attention normal.        Mood and Affect: Mood normal.        Speech: Speech normal.        Behavior: Behavior normal. Behavior is cooperative.        Thought Content: Thought content normal.     Results for orders placed or performed in visit on 01/07/23  CMP (Cancer Center only)  Result Value Ref Range    Sodium 140 135 - 145 mmol/L   Potassium 4.6 3.5 - 5.1 mmol/L   Chloride 104 98 - 111 mmol/L   CO2 28 22 - 32 mmol/L   Glucose, Bld 110 (H) 70 - 99 mg/dL   BUN 15 8 - 23 mg/dL   Creatinine 1.61 0.96 - 1.00 mg/dL   Calcium 9.3 8.9 - 04.5 mg/dL   Total Protein 6.9 6.5 - 8.1 g/dL   Albumin 3.9 3.5 - 5.0 g/dL   AST 22 15 - 41 U/L   ALT 20 0 -  44 U/L   Alkaline Phosphatase 65 38 - 126 U/L   Total Bilirubin 0.9 0.3 - 1.2 mg/dL   GFR, Estimated >78 >29 mL/min   Anion gap 8 5 - 15  CBC with Differential (Cancer Center Only)  Result Value Ref Range   WBC Count 8.2 4.0 - 10.5 K/uL   RBC 4.08 3.87 - 5.11 MIL/uL   Hemoglobin 12.6 12.0 - 15.0 g/dL   HCT 56.2 13.0 - 86.5 %   MCV 92.2 80.0 - 100.0 fL   MCH 30.9 26.0 - 34.0 pg   MCHC 33.5 30.0 - 36.0 g/dL   RDW 78.4 69.6 - 29.5 %   Platelet Count 192 150 - 400 K/uL   nRBC 0.0 0.0 - 0.2 %   Neutrophils Relative % 49 %   Neutro Abs 4.0 1.7 - 7.7 K/uL   Lymphocytes Relative 40 %   Lymphs Abs 3.3 0.7 - 4.0 K/uL   Monocytes Relative 8 %   Monocytes Absolute 0.7 0.1 - 1.0 K/uL   Eosinophils Relative 2 %   Eosinophils Absolute 0.1 0.0 - 0.5 K/uL   Basophils Relative 1 %   Basophils Absolute 0.1 0.0 - 0.1 K/uL   Immature Granulocytes 0 %   Abs Immature Granulocytes 0.02 0.00 - 0.07 K/uL      Assessment & Plan:   Problem List Items Addressed This Visit       Cardiovascular and Mediastinum   Aortic atherosclerosis (HCC) (Chronic)    Ongoing.  Noted on imaging 05/03/18.  Continue statin for prevention and monitor closely.      Relevant Orders   Comprehensive metabolic panel   Lipid Panel w/o Chol/HDL Ratio   Atrial fibrillation (HCC) (Chronic)    Ongoing, stable.  Ablation on 03/22/22 and 05/22/22.  Rate controlled. Continue current medication regimen and adjust as needed.  Continue to collaborate with cardiology, recent notes reviewed.  All questions answered.  Labs today: CMP and Lipid.        Relevant Orders   Comprehensive  metabolic panel   Lipid Panel w/o Chol/HDL Ratio   Hypercoagulable state due to paroxysmal atrial fibrillation (HCC)    Chronic, ongoing, on Eliquis.  Continue to monitor.  CBC up to date with oncology.        Other   BMI 40.0-44.9, adult (HCC) (Chronic)    Refer to morbid obesity plan of care.      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  Diagnosed on 04/01/22.  Continue collaboration with oncology, recent notes reviewed.        Hyperlipidemia (Chronic)    Chronic, ongoing.  Continue current medication regimen and adjust as needed.  Lipid panel today.      Relevant Orders   Comprehensive metabolic panel   Lipid Panel w/o Chol/HDL Ratio   Insomnia (Chronic)    Chronic, stable.  Continue Trazodone 1/2 a pill in evening as needed and continue to cut back.  Do not take unless needed.  She wishes to continue minimal use.  Discussed mouth appliances for sleep aides.      Morbid obesity (HCC) (Chronic)    BMI 43.13.  Recommended eating smaller high protein, low fat meals more frequently and exercising 30 mins a day 5 times a week with a goal of 10-15lb weight loss in the next 3 months. Patient voiced their understanding and motivation to adhere to these recommendations.  Referral to weight management.  Would benefit from a GLP1  in future as needs to lose 20 pounds for knee surgery, but unsure insurance will cover this for her -- provided her info to check on this.       RLS (restless legs syndrome) (Chronic)    Chronic and stable with Requip and Gabapentin, continue this regimen and adjust as needed.  Monitor kidney function closely with medications.  Labs today: CMP.      Lymphedema    Chronic, ongoing.  Continue collaboration with vascular and compression pumps per them.      Other Visit Diagnoses     IFG (impaired fasting glucose)       Check A1c today.   Relevant Orders   HgB A1c        Follow up plan: Return in about 7  months (around 08/08/2023) for Annual physical.

## 2023-01-13 NOTE — Assessment & Plan Note (Signed)
Refer to morbid obesity plan of care. 

## 2023-01-13 NOTE — Assessment & Plan Note (Signed)
Chronic, ongoing.  Continue current medication regimen and adjust as needed. Lipid panel today. 

## 2023-01-13 NOTE — Assessment & Plan Note (Signed)
Ongoing, stable.  Ablation on 03/22/22 and 05/22/22.  Rate controlled. Continue current medication regimen and adjust as needed.  Continue to collaborate with cardiology, recent notes reviewed.  All questions answered.  Labs today: CMP and Lipid.

## 2023-01-13 NOTE — Assessment & Plan Note (Signed)
Chronic, ongoing.  Continue collaboration with vascular and compression pumps per them.

## 2023-01-13 NOTE — Assessment & Plan Note (Signed)
Ongoing.  Noted on imaging 05/03/18.  Continue statin for prevention and monitor closely.

## 2023-01-14 ENCOUNTER — Other Ambulatory Visit (HOSPITAL_COMMUNITY): Payer: Self-pay

## 2023-01-14 NOTE — Progress Notes (Signed)
Contacted via MyChart   Good afternoon Erika Anderson, your labs have returned and overall remain stable.  No medication changes needed.  Kidney function, creatinine and eGFR, remains normal, as is liver function, AST and ALT. Cholesterol levels show LDL at goal.  A1c no prediabetes or diabetes.  Any questions? Keep being amazing!!  Thank you for allowing me to participate in your care.  I appreciate you. Kindest regards, 

## 2023-01-21 ENCOUNTER — Encounter: Payer: Self-pay | Admitting: Nurse Practitioner

## 2023-01-21 DIAGNOSIS — M170 Bilateral primary osteoarthritis of knee: Secondary | ICD-10-CM | POA: Diagnosis not present

## 2023-01-26 DIAGNOSIS — I890 Lymphedema, not elsewhere classified: Secondary | ICD-10-CM | POA: Diagnosis not present

## 2023-02-01 ENCOUNTER — Other Ambulatory Visit: Payer: Self-pay | Admitting: Oncology

## 2023-02-01 ENCOUNTER — Ambulatory Visit
Admission: RE | Admit: 2023-02-01 | Discharge: 2023-02-01 | Disposition: A | Discharge status: Home / Self Care | Payer: Medicare HMO | Source: Ambulatory Visit | Attending: Nurse Practitioner | Admitting: Nurse Practitioner

## 2023-02-01 ENCOUNTER — Other Ambulatory Visit: Payer: Self-pay

## 2023-02-01 DIAGNOSIS — Z1231 Encounter for screening mammogram for malignant neoplasm of breast: Secondary | ICD-10-CM | POA: Diagnosis not present

## 2023-02-01 DIAGNOSIS — C9110 Chronic lymphocytic leukemia of B-cell type not having achieved remission: Secondary | ICD-10-CM

## 2023-02-02 ENCOUNTER — Other Ambulatory Visit: Payer: Self-pay

## 2023-02-02 ENCOUNTER — Ambulatory Visit: Payer: Medicare HMO | Admitting: Nurse Practitioner

## 2023-02-02 MED ORDER — BRUKINSA 80 MG PO CAPS
160.0000 mg | ORAL_CAPSULE | Freq: Two times a day (BID) | ORAL | 3 refills | Status: DC
Start: 2023-02-02 — End: 2023-05-30
  Filled 2023-02-02: qty 120, 30d supply, fill #0
  Filled 2023-02-23: qty 120, 30d supply, fill #1
  Filled 2023-04-05: qty 120, 30d supply, fill #2
  Filled 2023-04-28: qty 120, 30d supply, fill #3

## 2023-02-02 NOTE — Progress Notes (Signed)
Contacted via MyChart   Normal mammogram, may repeat in one year:)

## 2023-02-03 ENCOUNTER — Other Ambulatory Visit (HOSPITAL_COMMUNITY): Payer: Self-pay

## 2023-02-04 ENCOUNTER — Telehealth: Payer: Self-pay | Admitting: Nurse Practitioner

## 2023-02-04 NOTE — Telephone Encounter (Signed)
Pt is seeing a doctor about her knee and wants to put her on celebrex and they want to make sure it's okay to take both of the medications together. Please follow up with pt.

## 2023-02-08 ENCOUNTER — Encounter: Payer: Self-pay | Admitting: Nurse Practitioner

## 2023-02-09 ENCOUNTER — Other Ambulatory Visit (HOSPITAL_COMMUNITY): Payer: Self-pay

## 2023-02-09 ENCOUNTER — Other Ambulatory Visit: Payer: Self-pay | Admitting: Nurse Practitioner

## 2023-02-10 NOTE — Telephone Encounter (Signed)
Requested medication (s) are due for refill today: yes  Requested medication (s) are on the active medication list: yes  Last refill:  05/03/22  Future visit scheduled: yes  Notes to clinic:  Unable to refill per protocol, last refill by historical provider. Routing to PCP for approval.     Requested Prescriptions  Pending Prescriptions Disp Refills   traZODone (DESYREL) 50 MG tablet [Pharmacy Med Name: traZODone HCl 50 MG Oral Tablet] 90 tablet 0    Sig: TAKE 1/2 TO 1 (ONE-HALF TO ONE) TABLET BY MOUTH AT BEDTIME AS NEEDED FOR SLEEP     Psychiatry: Antidepressants - Serotonin Modulator Passed - 02/09/2023  6:43 AM      Passed - Valid encounter within last 6 months    Recent Outpatient Visits           4 weeks ago CLL (chronic lymphocytic leukemia) (HCC)   [place] [facility] Dunnigan, Sardis T, NP   2 months ago Allergic disorder, initial encounter   [place] [facility] Larae Grooms, NP   4 months ago CLL (chronic lymphocytic leukemia) (HCC)   [place] [facility] [place] Parker, Corrie Dandy T, NP   6 months ago CLL (chronic lymphocytic leukemia) (HCC)   [place] [facility] [place], Corrie Dandy T, NP   7 months ago CLL (chronic lymphocytic leukemia) (HCC)   [place] [facility] [place], Dorie Rank, NP       Future Appointments             In 6 months Cannady, Dorie Rank, NP [place] [facility]

## 2023-02-22 DIAGNOSIS — L218 Other seborrheic dermatitis: Secondary | ICD-10-CM | POA: Diagnosis not present

## 2023-02-23 ENCOUNTER — Other Ambulatory Visit: Payer: Self-pay

## 2023-02-23 ENCOUNTER — Other Ambulatory Visit (HOSPITAL_BASED_OUTPATIENT_CLINIC_OR_DEPARTMENT_OTHER): Payer: Self-pay

## 2023-02-26 DIAGNOSIS — I890 Lymphedema, not elsewhere classified: Secondary | ICD-10-CM | POA: Diagnosis not present

## 2023-03-08 ENCOUNTER — Ambulatory Visit: Payer: Medicare HMO

## 2023-03-08 DIAGNOSIS — Z0000 Encounter for general adult medical examination without abnormal findings: Secondary | ICD-10-CM | POA: Diagnosis not present

## 2023-03-08 NOTE — Progress Notes (Signed)
Subjective:   Erika Anderson is a 72 y.o. female who presents for Medicare Annual (Subsequent) preventive examination.  Visit Complete: Virtual  I connected with  Erika Anderson on 03/08/23 by a audio enabled telemedicine application and verified that I am speaking with the correct person using two identifiers.  Patient Location: Home  Provider Location: Office/Clinic  I discussed the limitations of evaluation and management by telemedicine. The patient expressed understanding and agreed to proceed.  Because this visit was a virtual/telehealth visit, some criteria may be missing or patient reported. Any vitals not documented were not able to be obtained and vitals that have been documented are patient reported.  Cardiac Risk Factors include: advanced age (>21men, >27 women);family history of premature cardiovascular disease;obesity (BMI >30kg/m2);dyslipidemia     Objective:    Today's Vitals   03/08/23 0943  PainSc: 4    There is no height or weight on file to calculate BMI.     03/08/2023    9:48 AM 01/07/2023    9:51 AM 10/07/2022   11:07 AM 07/08/2022   10:20 AM 05/03/2022   11:07 AM 04/01/2022   10:10 AM 03/22/2022    5:39 AM  Advanced Directives  Does Patient Have a Medical Advance Directive? No No No No No No No  Would patient like information on creating a medical advance directive? No - Patient declined No - Patient declined No - Patient declined No - Patient declined No - Patient declined No - Patient declined No - Patient declined    Current Medications (verified) Outpatient Encounter Medications as of 03/08/2023  Medication Sig   apixaban (ELIQUIS) 5 MG TABS tablet Take 1 tablet by mouth twice daily   betamethasone dipropionate 0.05 % lotion Apply topically daily.   citalopram (CELEXA) 20 MG tablet Take 1 tablet by mouth once daily   gabapentin (NEURONTIN) 300 MG capsule Take 2 capsules (600 mg total) by mouth at bedtime. (Patient taking differently: Take  300 mg by mouth at bedtime as needed (pain).)   ketoconazole (NIZORAL) 2 % shampoo Apply topically 3 (three) times a week.   metoprolol tartrate (LOPRESSOR) 25 MG tablet Take 1 tablet by mouth twice daily   Multiple Vitamins-Minerals (MULTIVITAMIN WITH MINERALS) tablet Take 1 tablet by mouth daily.   rOPINIRole (REQUIP) 1 MG tablet Take 1 tablet by mouth 4 times daily   rosuvastatin (CRESTOR) 20 MG tablet Take 1 tablet (20 mg total) by mouth daily.   traZODone (DESYREL) 50 MG tablet TAKE 1/2 TO 1 (ONE-HALF TO ONE) TABLET BY MOUTH AT BEDTIME AS NEEDED FOR SLEEP   zanubrutinib (BRUKINSA) 80 MG capsule Take 2 capsules (160 mg total) by mouth 2 (two) times daily.   levocetirizine (XYZAL) 5 MG tablet Take 5 mg by mouth every evening.   pantoprazole (PROTONIX) 40 MG tablet Take 1 tablet (40 mg total) by mouth daily.   No facility-administered encounter medications on file as of 03/08/2023.    Allergies (verified) Patient has no known allergies.   History: Past Medical History:  Diagnosis Date   Atrial fibrillation (HCC)    Hyperlipidemia    Leukemia (HCC)    Obesity (BMI 30-39.9)    Restless legs syndrome (RLS) 12/28/2014   RLS (restless legs syndrome)    Past Surgical History:  Procedure Laterality Date   ATRIAL FIBRILLATION ABLATION N/A 03/22/2022   Procedure: ATRIAL FIBRILLATION ABLATION;  Surgeon: Lanier Prude, MD;  Location: [facility] INVASIVE CV LAB;  Service: Cardiovascular;  Laterality: N/A;  COLONOSCOPY WITH PROPOFOL N/A 08/24/2021   Procedure: COLONOSCOPY WITH PROPOFOL;  Surgeon: Wyline Mood, MD;  [facility]: [facility] ENDOSCOPY;  Service: Gastroenterology;  Laterality: N/A;   HEMORROIDECTOMY     TOTAL ABDOMINAL HYSTERECTOMY     ovaries remain   Family History  Problem Relation Age of Onset   Cancer Mother        ovarian   Dementia Father    Arthritis Sister    Heart disease Brother        MI   Stroke Paternal Grandmother    Cancer Paternal Grandfather        lung   Breast  cancer Neg Hx    Social History   Socioeconomic History   Marital status: Married    Spouse name: Not on file   Number of children: Not on file   Years of education: 12   Highest education level: High school graduate  Occupational History   Occupation: retired  Tobacco Use   Smoking status: Never   Smokeless tobacco: Never   Tobacco comments:    Never smoke 04/19/22  Vaping Use   Vaping status: Never Used  Substance and Sexual Activity   Alcohol use: No    Alcohol/week: 0.0 standard drinks of alcohol   Drug use: No   Sexual activity: Not Currently  Other Topics Concern   Not on file  Social History Narrative   Helps take care of grandchildren    Social Determinants of Health   Financial Resource Strain: Low Risk  (03/08/2023)   Overall Financial Resource Strain (CARDIA)    Difficulty of Paying Living Expenses: Not hard at all  Food Insecurity: No Food Insecurity (03/08/2023)   Hunger Vital Sign    Worried About Running Out of Food in the Last Year: Never true    Ran Out of Food in the Last Year: Never true  Transportation Needs: No Transportation Needs (03/08/2023)   PRAPARE - Administrator, Civil Service (Medical): No    Lack of Transportation (Non-Medical): No  Physical Activity: Sufficiently Active (03/08/2023)   Exercise Vital Sign    Days of Exercise per Week: 7 days    Minutes of Exercise per Session: 30 min  Stress: No Stress Concern Present (03/08/2023)   [employer] of Occupational Health - Occupational Stress Questionnaire    Feeling of Stress : Not at all  Social Connections: Moderately Integrated (03/08/2023)   Social Connection and Isolation Panel [NHANES]    Frequency of Communication with Friends and Family: More than three times a week    Frequency of Social Gatherings with Friends and Family: Twice a week    Attends Religious Services: More than 4 times per year    Active Member of [employer] or Organizations: No    Attends Tax inspector Meetings: Never    Marital Status: Married    Tobacco Counseling Counseling given: Not Answered Tobacco comments: Never smoke 04/19/22   Clinical Intake:  Pre-visit preparation completed: Yes  Pain : 0-10 Pain Score: 4  Pain Type: Chronic pain Pain Location: Knee Pain Orientation: Right, Left Pain Descriptors / Indicators: Aching Pain Onset: More than a month ago Pain Frequency: Intermittent Pain Relieving Factors: knee shot  Pain Relieving Factors: knee shot  Nutritional Status: BMI > 30  Obese Nutritional Risks: None Diabetes: No  How often do you need to have someone help you when you read instructions, pamphlets, or other written materials from your doctor or pharmacy?: 1 - Never  Interpreter Needed?: No  Information entered by :: Kennedy Bucker, LPN   Activities of Daily Living    03/08/2023    9:49 AM 08/04/2022    8:21 AM  In your present state of health, do you have any difficulty performing the following activities:  Hearing? 0 0  Vision? 0 0  Difficulty concentrating or making decisions? 0 0  Walking or climbing stairs? 1 0  Comment knees   Dressing or bathing? 0 0  Doing errands, shopping? 0 0  Preparing Food and eating ? N   Using the Toilet? N   In the past six months, have you accidently leaked urine? N   Do you have problems with loss of bowel control? N   Managing your Medications? N   Managing your Finances? N   Housekeeping or managing your Housekeeping? N     Patient Care Team: Marjie Skiff, NP as PCP - General (Nurse Practitioner) Lanier Prude, MD as PCP - Electrophysiology (Cardiology) Debbe Odea, MD as PCP - Cardiology (Cardiology) Jim Like, RN as Registered Nurse Scarlett Presto, RN (Inactive) as Registered Nurse [name], MD as Consulting Physician (Oncology)  Indicate any recent Medical Services you may have received from other than [facility] providers in the past year (date may be  approximate).     Assessment:   This is a routine wellness examination for Retal.  Hearing/Vision screen Hearing Screening - Comments:: No aids Vision Screening - Comments:: Wears glasses- [facility]   Goals Addressed             This Visit's Progress    DIET - EAT MORE FRUITS AND VEGETABLES         Depression Screen    03/08/2023    9:47 AM 01/13/2023   10:08 AM 09/21/2022    9:11 AM 08/04/2022    8:13 AM 07/15/2022   10:14 AM 04/14/2022    9:25 AM 03/02/2022    8:41 AM  PHQ 2/9 Scores  PHQ - 2 Score 0 1 0 1 0 0 0  PHQ- 9 Score 0 9 3 4  0 4 4    Fall Risk    03/08/2023    9:49 AM 01/13/2023   10:08 AM 08/04/2022    8:12 AM 07/15/2022   10:14 AM 04/14/2022    9:25 AM  Fall Risk   Falls in the past year? 0 0 0 0 1  Number falls in past yr: 0 0 0 0 0  Injury with Fall? 0 0 0 0 0  Risk for fall due to : No Fall Risks No Fall Risks No Fall Risks No Fall Risks History of fall(s)  Follow up Falls prevention discussed;Falls evaluation completed Falls evaluation completed Falls evaluation completed Falls evaluation completed Falls evaluation completed    MEDICARE RISK AT HOME: Medicare Risk at Home Any stairs in or around the home?: Yes If so, are there any without handrails?: No Home free of loose throw rugs in walkways, pet beds, electrical cords, etc?: Yes Adequate lighting in your home to reduce risk of falls?: Yes Life alert?: No Use of a cane, walker or w/c?: No Grab bars in the bathroom?: Yes Shower chair or bench in shower?: No Elevated toilet seat or a handicapped toilet?: Yes  TIMED UP AND GO:  Was the test performed?  No    Cognitive Function:        03/08/2023    9:50 AM 03/02/2022    8:38 AM 02/27/2021  6:03 PM 02/22/2020    1:03 PM 01/12/2018    8:57 AM  6CIT Screen  What Year? 0 points 0 points 0 points 0 points 0 points  What month? 0 points 0 points 0 points 0 points 0 points  What time? 0 points 0 points 0 points 0 points 0 points  Count back  from 20 0 points 0 points 0 points 0 points 0 points  Months in reverse 2 points 0 points 0 points 0 points 0 points  Repeat phrase 0 points 0 points 0 points 2 points 2 points  Total Score 2 points 0 points 0 points 2 points 2 points    Immunizations Immunization History  Administered Date(s) Administered   Fluad Quad(high Dose 65+) 04/09/2019, 07/28/2020, 04/14/2022   Influenza, High Dose Seasonal PF 06/02/2021   Influenza,inj,Quad PF,6+ Mos 03/09/2016   Moderna Sars-Covid-2 Vaccination 07/04/2019, 08/01/2019   Pneumococcal Conjugate-13 01/11/2017   Pneumococcal Polysaccharide-23 01/12/2018   Tdap 12/30/2015   Zoster Recombinant(Shingrix) 02/07/2019, 05/15/2019    TDAP status: Up to date  Flu Vaccine status: Up to date  Pneumococcal vaccine status: Up to date  Covid-19 vaccine status: Completed vaccines  Qualifies for Shingles Vaccine? Yes   Zostavax completed No   Shingrix Completed?: Yes  Screening Tests Health Maintenance  Topic Date Due   COVID-19 Vaccine (3 - Moderna risk series) 08/29/2019   INFLUENZA VACCINE  01/06/2023   MAMMOGRAM  02/01/2024   Medicare Annual Wellness (AWV)  03/07/2024   DTaP/Tdap/Td (2 - Td or Tdap) 12/29/2025   Colonoscopy  08/25/2031   DEXA SCAN  01/29/2032   Pneumonia Vaccine 96+ Years old  Completed   Hepatitis C Screening  Completed   Zoster Vaccines- Shingrix  Completed   HPV VACCINES  Aged Out    Health Maintenance  Health Maintenance Due  Topic Date Due   COVID-19 Vaccine (3 - Moderna risk series) 08/29/2019   INFLUENZA VACCINE  01/06/2023    Colorectal cancer screening: Type of screening: Colonoscopy. Completed 08/24/21. Repeat every 10 years  Mammogram status: Completed 02/01/23. Repeat every year  Bone Density status: Completed 01/28/22. Results reflect: Bone density results: NORMAL. Repeat every 5 years.  Lung Cancer Screening: (Low Dose CT Chest recommended if Age 32-80 years, 20 pack-year currently smoking OR have  quit w/in 15years.) does not qualify.    Additional Screening:  Hepatitis C Screening: does qualify; Completed 01/11/17  Vision Screening: Recommended annual ophthalmology exams for early detection of glaucoma and other disorders of the eye. Is the patient up to date with their annual eye exam?  Yes  Who is the provider or what is the name of the office in which the patient attends annual eye exams? [facility] If pt is not established with a provider, would they like to be referred to a provider to establish care? No .   Dental Screening: Recommended annual dental exams for proper oral hygiene   Community Resource Referral / Chronic Care Management: CRR required this visit?  No   CCM required this visit?  No     Plan:     I have personally reviewed and noted the following in the patient's chart:   Medical and social history Use of alcohol, tobacco or illicit drugs  Current medications and supplements including opioid prescriptions. Patient is not currently taking opioid prescriptions. Functional ability and status Nutritional status Physical activity Advanced directives List of other physicians Hospitalizations, surgeries, and ER visits in previous 12 months Vitals Screenings to include cognitive,  depression, and falls Referrals and appointments  In addition, I have reviewed and discussed with patient certain preventive protocols, quality metrics, and best practice recommendations. A written personalized care plan for preventive services as well as general preventive health recommendations were provided to patient.     Hal Hope, LPN   40/02/8118   After Visit Summary: (MyChart) Due to this being a telephonic visit, the after visit summary with patients personalized plan was offered to patient via MyChart   Nurse Notes: none

## 2023-03-08 NOTE — Patient Instructions (Addendum)
Ms. Brossard , Thank you for taking time to come for your Medicare Wellness Visit. I appreciate your ongoing commitment to your health goals. Please review the following plan we discussed and let me know if I can assist you in the future.   Referrals/Orders/Follow-Ups/Clinician Recommendations: none  This is a list of the screening recommended for you and due dates:  Health Maintenance  Topic Date Due   COVID-19 Vaccine (3 - Moderna risk series) 08/29/2019   Flu Shot  01/06/2023   Mammogram  02/01/2024   Medicare Annual Wellness Visit  03/07/2024   DTaP/Tdap/Td vaccine (2 - Td or Tdap) 12/29/2025   Colon Cancer Screening  08/25/2031   DEXA scan (bone density measurement)  01/29/2032   Pneumonia Vaccine  Completed   Hepatitis C Screening  Completed   Zoster (Shingles) Vaccine  Completed   HPV Vaccine  Aged Out    Advanced directives: (ACP Link)Information on Advanced Care Planning can be found at [facility] Advance Health Care Directives Advance Health Care Directives ([email or web address]   Next Medicare Annual Wellness Visit scheduled for next year: Yes    03/13/24 @ 9:40 am by video

## 2023-03-09 ENCOUNTER — Other Ambulatory Visit: Payer: Self-pay | Admitting: Nurse Practitioner

## 2023-03-09 NOTE — Telephone Encounter (Signed)
Requested Prescriptions  Pending Prescriptions Disp Refills   ELIQUIS 5 MG TABS tablet [Pharmacy Med Name: Eliquis 5 MG Oral Tablet] 180 tablet 0    Sig: Take 1 tablet by mouth twice daily     Hematology:  Anticoagulants - apixaban Passed - 03/09/2023  6:42 AM      Passed - PLT in normal range and within 360 days    Platelets  Date Value Ref Range Status  02/19/2022 199 150 - 450 x10E3/uL Final   Platelet Count  Date Value Ref Range Status  01/07/2023 192 150 - 400 K/uL Final         Passed - HGB in normal range and within 360 days    Hemoglobin  Date Value Ref Range Status  01/07/2023 12.6 12.0 - 15.0 g/dL Final  13/01/6577 46.9 11.1 - 15.9 g/dL Final         Passed - HCT in normal range and within 360 days    HCT  Date Value Ref Range Status  01/07/2023 37.6 36.0 - 46.0 % Final   Hematocrit  Date Value Ref Range Status  02/19/2022 41.9 34.0 - 46.6 % Final         Passed - Cr in normal range and within 360 days    Creatinine  Date Value Ref Range Status  01/07/2023 0.96 0.44 - 1.00 mg/dL Final   Creatinine, Ser  Date Value Ref Range Status  01/13/2023 0.87 0.57 - 1.00 mg/dL Final         Passed - AST in normal range and within 360 days    AST  Date Value Ref Range Status  01/13/2023 19 0 - 40 IU/L Final  01/07/2023 22 15 - 41 U/L Final         Passed - ALT in normal range and within 360 days    ALT  Date Value Ref Range Status  01/13/2023 16 0 - 32 IU/L Final  01/07/2023 20 0 - 44 U/L Final         Passed - Valid encounter within last 12 months    Recent Outpatient Visits           1 month ago CLL (chronic lymphocytic leukemia) (HCC)   [place] [facility] [place], Corrie Dandy T, NP   3 months ago Allergic disorder, initial encounter   [place] [facility] Larae Grooms, NP   5 months ago CLL (chronic lymphocytic leukemia) (HCC)   [place] [facility] [place], Corrie Dandy T, NP   7 months ago CLL (chronic  lymphocytic leukemia) (HCC)   [place] [facility] [place], Corrie Dandy T, NP   7 months ago CLL (chronic lymphocytic leukemia) (HCC)   [place] [facility] [place], Dorie Rank, NP       Future Appointments             In 5 months Cannady, Dorie Rank, NP  [facility]

## 2023-03-30 DIAGNOSIS — M170 Bilateral primary osteoarthritis of knee: Secondary | ICD-10-CM | POA: Diagnosis not present

## 2023-04-02 ENCOUNTER — Other Ambulatory Visit: Payer: Self-pay | Admitting: Nurse Practitioner

## 2023-04-04 NOTE — Telephone Encounter (Signed)
Requested Prescriptions  Pending Prescriptions Disp Refills   citalopram (CELEXA) 20 MG tablet [Pharmacy Med Name: Citalopram Hydrobromide 20 MG Oral Tablet] 90 tablet 0    Sig: Take 1 tablet by mouth once daily     Psychiatry:  Antidepressants - SSRI Passed - 04/02/2023  6:42 AM      Passed - Valid encounter within last 6 months    Recent Outpatient Visits           2 months ago CLL (chronic lymphocytic leukemia) (HCC)   [place] [facility] [place], Corrie Dandy T, NP   4 months ago Allergic disorder, initial encounter   [place] [facility] Larae Grooms, NP   6 months ago CLL (chronic lymphocytic leukemia) (HCC)   [place] [facility] [place], Corrie Dandy T, NP   8 months ago CLL (chronic lymphocytic leukemia) (HCC)   [place] [facility] [place], Corrie Dandy T, NP   8 months ago CLL (chronic lymphocytic leukemia) (HCC)   [place] [facility] [place], Dorie Rank, NP       Future Appointments             In 4 months Cannady, Dorie Rank, NP [facility]

## 2023-04-05 ENCOUNTER — Other Ambulatory Visit (HOSPITAL_COMMUNITY): Payer: Self-pay | Admitting: Pharmacy Technician

## 2023-04-05 ENCOUNTER — Other Ambulatory Visit (HOSPITAL_COMMUNITY): Payer: Self-pay

## 2023-04-05 NOTE — Progress Notes (Signed)
Specialty Pharmacy Refill Coordination Note  Erika Anderson is a 72 y.o. female contacted today regarding refills of specialty medication(s) Zanubrutinib   Patient requested Delivery   Delivery date: 04/13/23   Verified address: [address] [place]   Medication will be filled on 04/12/23.

## 2023-04-08 ENCOUNTER — Ambulatory Visit: Payer: Medicare HMO | Admitting: Oncology

## 2023-04-08 ENCOUNTER — Other Ambulatory Visit: Payer: Medicare HMO

## 2023-04-11 ENCOUNTER — Ambulatory Visit: Payer: Medicare HMO | Admitting: Oncology

## 2023-04-11 ENCOUNTER — Other Ambulatory Visit: Payer: Medicare HMO

## 2023-04-15 ENCOUNTER — Encounter: Payer: Self-pay | Admitting: Oncology

## 2023-04-15 ENCOUNTER — Inpatient Hospital Stay: Payer: Medicare HMO | Admitting: Oncology

## 2023-04-15 ENCOUNTER — Inpatient Hospital Stay: Payer: Medicare HMO | Attending: Oncology

## 2023-04-15 VITALS — BP 119/90 | HR 59 | Temp 97.6°F | Resp 18 | Wt 254.1 lb

## 2023-04-15 DIAGNOSIS — Z801 Family history of malignant neoplasm of trachea, bronchus and lung: Secondary | ICD-10-CM | POA: Insufficient documentation

## 2023-04-15 DIAGNOSIS — Z818 Family history of other mental and behavioral disorders: Secondary | ICD-10-CM | POA: Insufficient documentation

## 2023-04-15 DIAGNOSIS — G2581 Restless legs syndrome: Secondary | ICD-10-CM | POA: Diagnosis not present

## 2023-04-15 DIAGNOSIS — Z79899 Other long term (current) drug therapy: Secondary | ICD-10-CM | POA: Insufficient documentation

## 2023-04-15 DIAGNOSIS — Z8249 Family history of ischemic heart disease and other diseases of the circulatory system: Secondary | ICD-10-CM | POA: Diagnosis not present

## 2023-04-15 DIAGNOSIS — Z823 Family history of stroke: Secondary | ICD-10-CM | POA: Insufficient documentation

## 2023-04-15 DIAGNOSIS — Z7901 Long term (current) use of anticoagulants: Secondary | ICD-10-CM | POA: Insufficient documentation

## 2023-04-15 DIAGNOSIS — C9110 Chronic lymphocytic leukemia of B-cell type not having achieved remission: Secondary | ICD-10-CM

## 2023-04-15 DIAGNOSIS — Z8261 Family history of arthritis: Secondary | ICD-10-CM | POA: Diagnosis not present

## 2023-04-15 DIAGNOSIS — Z5111 Encounter for antineoplastic chemotherapy: Secondary | ICD-10-CM

## 2023-04-15 DIAGNOSIS — Z90710 Acquired absence of both cervix and uterus: Secondary | ICD-10-CM | POA: Diagnosis not present

## 2023-04-15 DIAGNOSIS — G479 Sleep disorder, unspecified: Secondary | ICD-10-CM | POA: Insufficient documentation

## 2023-04-15 DIAGNOSIS — Z8041 Family history of malignant neoplasm of ovary: Secondary | ICD-10-CM | POA: Diagnosis not present

## 2023-04-15 LAB — CBC WITH DIFFERENTIAL (CANCER CENTER ONLY)
Abs Immature Granulocytes: 0.02 10*3/uL (ref 0.00–0.07)
Basophils Absolute: 0.1 10*3/uL (ref 0.0–0.1)
Basophils Relative: 1 %
Eosinophils Absolute: 0.1 10*3/uL (ref 0.0–0.5)
Eosinophils Relative: 1 %
HCT: 41.2 % (ref 36.0–46.0)
Hemoglobin: 13.5 g/dL (ref 12.0–15.0)
Immature Granulocytes: 0 %
Lymphocytes Relative: 33 %
Lymphs Abs: 3.2 10*3/uL (ref 0.7–4.0)
MCH: 29.9 pg (ref 26.0–34.0)
MCHC: 32.8 g/dL (ref 30.0–36.0)
MCV: 91.2 fL (ref 80.0–100.0)
Monocytes Absolute: 0.9 10*3/uL (ref 0.1–1.0)
Monocytes Relative: 9 %
Neutro Abs: 5.5 10*3/uL (ref 1.7–7.7)
Neutrophils Relative %: 56 %
Platelet Count: 217 10*3/uL (ref 150–400)
RBC: 4.52 MIL/uL (ref 3.87–5.11)
RDW: 13.2 % (ref 11.5–15.5)
WBC Count: 9.7 10*3/uL (ref 4.0–10.5)
nRBC: 0 % (ref 0.0–0.2)

## 2023-04-15 LAB — CMP (CANCER CENTER ONLY)
ALT: 17 U/L (ref 0–44)
AST: 19 U/L (ref 15–41)
Albumin: 4.1 g/dL (ref 3.5–5.0)
Alkaline Phosphatase: 67 U/L (ref 38–126)
Anion gap: 5 (ref 5–15)
BUN: 23 mg/dL (ref 8–23)
CO2: 29 mmol/L (ref 22–32)
Calcium: 9.2 mg/dL (ref 8.9–10.3)
Chloride: 104 mmol/L (ref 98–111)
Creatinine: 0.91 mg/dL (ref 0.44–1.00)
GFR, Estimated: >60 mL/min (ref >60)
Glucose, Bld: 99 mg/dL (ref 70–99)
Potassium: 4.9 mmol/L (ref 3.5–5.1)
Sodium: 138 mmol/L (ref 135–145)
Total Bilirubin: 0.8 mg/dL (ref <1.2)
Total Protein: 7 g/dL (ref 6.5–8.1)

## 2023-04-15 LAB — LACTATE DEHYDROGENASE: LDH: 145 U/L (ref 98–192)

## 2023-04-15 NOTE — Assessment & Plan Note (Signed)
Iron panel showed adequate iron store Continue to monitor.

## 2023-04-15 NOTE — Progress Notes (Signed)
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 Rapid lymphocyte increment, 50% over 2 months period She is on Elqiuis, will watch for possible increase risk of bleeding.  Labs are reviewed and discussed with patient. Continue Zanubrutinib 160mg  BID, clinically she tolerates well.    RLS (restless legs syndrome) Iron panel showed adequate iron store Continue to monitor.  Encounter for antineoplastic chemotherapy Continue Zanubrutinib 160mg  BID    Follow up 3 months  [name], MD, PhD [facility] Hematology Oncology 04/15/2023       HISTORY OF PRESENTING ILLNESS:  Erika Anderson [facility] 72 y.o. female presents to follow up with CLL. She was found to have abnormal CBC on 03/01/22, total white count of 15, predominantly lymphocytosis. This is chronic since Feb 2023. Peripheral blood smear + smudge cells.  + Restless leg syndrome, sleep disturbance for 1 year.  + Family history of ovarian cancer -mother.    Oncology History  CLL (chronic lymphocytic leukemia) (HCC)  03/02/2022 Initial Diagnosis   CLL (chronic lymphocytic leukemia)  -03/15/22 Flowcytometry showed chronic lymphocytic leukemia, B cell, CD38 expression indeterminate for prognosis (23%) and CD20 positive.  45% nuclei positive for TP53 gene deletion.    03/26/2022 Imaging   US abdomen 1. No sonographic etiology for night sweats identified. 2. Spleen is at the  upper limits of normal in size    04/01/2022 Cancer Staging   Staging form: Chronic Lymphocytic Leukemia / Small Lymphocytic Lymphoma, AJCC 8th Edition - Clinical: Modified Rai Stage 0 (Modified Rai risk: Low, Lymphocytosis: Present, Adenopathy: Absent, Organomegaly: Absent, Anemia: Absent, Thrombocytopenia: Absent) - Signed by [name], MD on 04/01/2022 Stage prefix: Initial diagnosis Absolute lymphocyte count (ALC) (cells/uL): 18   04/19/2022 -  Chemotherapy   Started on Zanubrutinib 160mg  BID      She reports tolerating Zanubrutinib 160mg  BID well. Night sweat has resolved  Appetite is fair. + Knee pain, there is plan for physical therapy and a possible future knee replacement.   MEDICAL HISTORY:  Past Medical History:  Diagnosis Date   Atrial fibrillation (HCC)    Hyperlipidemia    Leukemia (HCC)    Obesity (BMI 30-39.9)    Restless legs syndrome (RLS) 12/28/2014   RLS (restless legs syndrome)     SURGICAL HISTORY: Past Surgical History:  Procedure Laterality Date   ATRIAL FIBRILLATION ABLATION N/A 03/22/2022   Procedure: ATRIAL FIBRILLATION ABLATION;  Surgeon: Lanier Prude, MD;  Location: [facility] INVASIVE CV LAB;  Service: Cardiovascular;  Laterality: N/A;   COLONOSCOPY WITH PROPOFOL N/A 08/24/2021   Procedure: COLONOSCOPY WITH PROPOFOL;  Surgeon: Wyline Mood, MD;  Location: [facility] ENDOSCOPY;  Service: Gastroenterology;  Laterality: N/A;   HEMORROIDECTOMY     TOTAL ABDOMINAL HYSTERECTOMY     ovaries remain    SOCIAL HISTORY: Social History   Socioeconomic History   Marital status: Married    Spouse name: Not on file   Number of children: Not on file   Years of education: 12   Highest education level: High school graduate  Occupational History   Occupation: retired  Tobacco Use   Smoking status: Never   Smokeless tobacco: Never   Tobacco comments:    Never smoke 04/19/22  Vaping Use   Vaping status: Never Used  Substance and Sexual Activity   Alcohol  use: No    Alcohol/week: 0.0 standard drinks of alcohol   Drug use: No   Sexual activity: Not Currently  Other Topics Concern   Not on file  Social History Narrative   Helps take care of grandchildren    Social Determinants of Health   Financial Resource Strain: Low Risk  (03/08/2023)   Overall Financial Resource Strain (CARDIA)    Difficulty of Paying Living Expenses: Not hard at all  Food Insecurity: No Food Insecurity (03/08/2023)   Hunger Vital Sign    Worried About Running Out of Food in the Last Year: Never true    Ran Out of Food in the Last Year: Never true  Transportation Needs: No Transportation Needs (03/08/2023)   PRAPARE - Administrator, Civil Service (Medical): No    Lack of Transportation (Non-Medical): No  Physical Activity: Sufficiently Active (03/08/2023)   Exercise Vital Sign    Days of Exercise per Week: 7 days    Minutes of Exercise per Session: 30 min  Stress: No Stress Concern Present (03/08/2023)   [employer] of Occupational Health - Occupational Stress Questionnaire    Feeling of Stress : Not at all  Social Connections: Moderately Integrated (03/08/2023)   Social Connection and Isolation Panel [NHANES]    Frequency of Communication with Friends and Family: More than three times a week    Frequency of Social Gatherings with Friends and Family: Twice a week    Attends Religious Services: More than 4 times per year    Active Member of [employer] or Organizations: No    Attends Banker Meetings: Never    Marital Status: Married  Catering manager Violence: Not At Risk (03/08/2023)   Humiliation, Afraid, Rape, and Kick questionnaire    Fear of Current or Ex-Partner: No    Emotionally Abused: No    Physically Abused: No    Sexually Abused: No    FAMILY HISTORY: Family History  Problem Relation Age of Onset   Cancer Mother        ovarian   Dementia Father    Arthritis Sister    Heart disease Brother        MI   Stroke Paternal  Grandmother    Cancer Paternal Grandfather        lung   Breast cancer Neg Hx     ALLERGIES:  has No Known Allergies.  MEDICATIONS:  Current Outpatient Medications  Medication Sig Dispense Refill   betamethasone dipropionate 0.05 % lotion Apply topically daily.     citalopram (CELEXA) 20 MG tablet Take 1 tablet by mouth once daily 90 tablet 0   ELIQUIS 5 MG TABS tablet Take 1 tablet by mouth twice daily 180 tablet 0   gabapentin (NEURONTIN) 300 MG capsule Take 2 capsules (600 mg total) by mouth at bedtime. (Patient taking differently: Take 300 mg by mouth at bedtime as needed (pain).) 180 capsule 4   ketoconazole (NIZORAL) 2 % shampoo Apply topically 3 (three) times a week.     levocetirizine (XYZAL) 5 MG tablet Take 5 mg by mouth every evening.     metoprolol tartrate (LOPRESSOR) 25 MG tablet Take 1 tablet by mouth twice daily 180 tablet 4   Multiple Vitamins-Minerals (MULTIVITAMIN WITH MINERALS) tablet  Take 1 tablet by mouth daily.     pantoprazole (PROTONIX) 40 MG tablet Take 1 tablet (40 mg total) by mouth daily. 90 tablet 3   rOPINIRole (REQUIP) 1 MG tablet Take 1 tablet by mouth 4 times daily 360 tablet 4   rosuvastatin (CRESTOR) 20 MG tablet Take 1 tablet (20 mg total) by mouth daily. 90 tablet 4   traZODone (DESYREL) 50 MG tablet TAKE 1/2 TO 1 (ONE-HALF TO ONE) TABLET BY MOUTH AT BEDTIME AS NEEDED FOR SLEEP 90 tablet 4   zanubrutinib (BRUKINSA) 80 MG capsule Take 2 capsules (160 mg total) by mouth 2 (two) times daily. 120 capsule 3   No current facility-administered medications for this visit.    Review of Systems  Constitutional:  Negative for appetite change, chills, fatigue and fever.  HENT:   Negative for hearing loss and voice change.   Eyes:  Negative for eye problems.  Respiratory:  Negative for chest tightness and cough.   Cardiovascular:  Negative for chest pain.  Gastrointestinal:  Negative for abdominal distention, abdominal pain and blood in stool.  Endocrine:  Negative for hot flashes.       Night sweat  Genitourinary:  Negative for difficulty urinating and frequency.   Musculoskeletal:  Negative for arthralgias.  Skin:  Negative for itching and rash.  Neurological:  Negative for extremity weakness.  Hematological:  Negative for adenopathy.  Psychiatric/Behavioral:  Positive for sleep disturbance. Negative for confusion.      PHYSICAL EXAMINATION: ECOG PERFORMANCE STATUS: 1 - Symptomatic but completely ambulatory  Vitals:   04/15/23 0957  BP: (!) 119/90  Pulse: (!) 59  Resp: 18  Temp: 97.6 F (36.4 C)  SpO2: 97%   Filed Weights   04/15/23 0957  Weight: 254 lb 1.6 oz (115.3 kg)    Physical Exam HENT:     Head: Normocephalic.     Nose: Nose normal.     Mouth/Throat:     Pharynx: No oropharyngeal exudate.  Eyes:     General: No scleral icterus.    Pupils: Pupils are equal, round, and reactive to light.  Cardiovascular:     Rate and Rhythm: Normal rate.  Pulmonary:     Effort: Pulmonary effort is normal. No respiratory distress.     Breath sounds: No wheezing.  Abdominal:     General: There is no distension.     Palpations: Abdomen is soft.  Musculoskeletal:        General: Normal range of motion.     Cervical back: Normal range of motion.  Skin:    General: Skin is warm and dry.     Findings: No erythema.  Neurological:     Mental Status: She is alert and oriented to person, place, and time. Mental status is at baseline.     Cranial Nerves: No cranial nerve deficit.     Motor: No abnormal muscle tone.  Psychiatric:        Mood and Affect: Affect normal.     LABORATORY DATA:  I have reviewed the data as listed    Latest Ref Rng & Units 04/15/2023    9:44 AM 01/07/2023    9:41 AM 10/07/2022   10:34 AM  CBC  WBC 4.0 - 10.5 K/uL 9.7  8.2  10.6   Hemoglobin 12.0 - 15.0 g/dL 29.9  37.1  69.6   Hematocrit 36.0 - 46.0 % 41.2  37.6  40.3   Platelets 150 - 400 K/uL 217  192  202  Latest Ref Rng & Units 04/15/2023     9:44 AM 01/13/2023   10:41 AM 01/07/2023    9:41 AM  CMP  Glucose 70 - 99 mg/dL 99  161  096   BUN 8 - 23 mg/dL 23  14  15    Creatinine 0.44 - 1.00 mg/dL 0.45  4.09  8.11   Sodium 135 - 145 mmol/L 138  141  140   Potassium 3.5 - 5.1 mmol/L 4.9  4.1  4.6   Chloride 98 - 111 mmol/L 104  103  104   CO2 22 - 32 mmol/L 29  25  28    Calcium 8.9 - 10.3 mg/dL 9.2  9.4  9.3   Total Protein 6.5 - 8.1 g/dL 7.0  6.3  6.9   Total Bilirubin <1.2 mg/dL 0.8  0.4  0.9   Alkaline Phos 38 - 126 U/L 67  85  65   AST 15 - 41 U/L 19  19  22    ALT 0 - 44 U/L 17  16  20     Lab Results  Component Value Date   LDH 145 04/15/2023   LDH 192 03/15/2022    RADIOGRAPHIC STUDIES: I have personally reviewed the radiological images as listed and agreed with the findings in the report. No results found.

## 2023-04-15 NOTE — Assessment & Plan Note (Signed)
Continue Zanubrutinib '160mg'$  BID

## 2023-04-15 NOTE — Assessment & Plan Note (Addendum)
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 Rapid lymphocyte increment, 50% over 2 months period She is on Elqiuis, will watch for possible increase risk of bleeding.  Labs are reviewed and discussed with patient. Continue Zanubrutinib '160mg'$  BID, clinically she tolerates well.

## 2023-04-26 DIAGNOSIS — H538 Other visual disturbances: Secondary | ICD-10-CM | POA: Diagnosis not present

## 2023-04-26 DIAGNOSIS — H2511 Age-related nuclear cataract, right eye: Secondary | ICD-10-CM | POA: Diagnosis not present

## 2023-04-26 DIAGNOSIS — H04123 Dry eye syndrome of bilateral lacrimal glands: Secondary | ICD-10-CM | POA: Diagnosis not present

## 2023-04-26 DIAGNOSIS — H35363 Drusen (degenerative) of macula, bilateral: Secondary | ICD-10-CM | POA: Diagnosis not present

## 2023-04-26 DIAGNOSIS — H2512 Age-related nuclear cataract, left eye: Secondary | ICD-10-CM | POA: Diagnosis not present

## 2023-04-28 ENCOUNTER — Other Ambulatory Visit: Payer: Self-pay

## 2023-04-28 NOTE — Progress Notes (Signed)
Specialty Pharmacy Refill Coordination Note  Erika Anderson is a 72 y.o. female contacted today regarding refills of specialty medication(s) Zanubrutinib   Patient requested Delivery   Delivery date: 05/09/23   Verified address: [address] [place]    Medication will be filled on 05/04/23 due to holiday and [employer] surepost.

## 2023-05-02 ENCOUNTER — Other Ambulatory Visit (HOSPITAL_COMMUNITY): Payer: Self-pay

## 2023-05-04 ENCOUNTER — Other Ambulatory Visit: Payer: Self-pay

## 2023-05-09 ENCOUNTER — Encounter: Payer: Self-pay | Admitting: Ophthalmology

## 2023-05-09 DIAGNOSIS — H2512 Age-related nuclear cataract, left eye: Secondary | ICD-10-CM | POA: Diagnosis not present

## 2023-05-11 ENCOUNTER — Other Ambulatory Visit: Payer: Self-pay

## 2023-05-11 NOTE — Telephone Encounter (Signed)
Please review refill for Protonix.

## 2023-05-16 NOTE — Discharge Instructions (Signed)

## 2023-05-17 ENCOUNTER — Encounter: Payer: Self-pay | Admitting: Ophthalmology

## 2023-05-17 NOTE — Anesthesia Preprocedure Evaluation (Signed)
Anesthesia Evaluation  Patient identified by MRN, date of birth, ID band Patient awake    Reviewed: Allergy & Precautions, H&P , NPO status , Patient's Chart, lab work & pertinent test results  Airway Mallampati: III  TM Distance: <3 FB Neck ROM: Full   Comment: Very short TMD, 1 FB TMD, likely anterior airway  Snores heavily, tried to have sleep study, but was unable to go to sleep for the study, so was dismissed.  Dental no notable dental hx.    Pulmonary neg pulmonary ROS   Pulmonary exam normal breath sounds clear to auscultation       Cardiovascular Exercise Tolerance: Poor + angina  Normal cardiovascular exam Rhythm:Regular Rate:Normal      ECHOCARDIOGRAM REPORT       Patient Name:   Nick ANN Caudill Date of Exam: 10/15/2021 Medical Rec #:  [id number]          Height:       66.0 in Accession #:    [id number]         Weight:       239.2 lb Date of Birth:  March 26, 1951          BSA:          2.158 m Patient Age:    72 years           BP:           130/70 mmHg Patient Gender: F                  HR:           52 bpm. Exam Location:  [place]  Procedure: 2D Echo, 3D Echo, Cardiac Doppler, Color Doppler and Strain Analysis  Indications:    R06.02 SOB; I48.92* Unspecified atrial flutter   History:        Patient has no prior history of Echocardiogram examinations.                 Arrythmias:Atrial Flutter, Signs/Symptoms:Shortness of Breath;                 Risk Factors:Non-Smoker and Dyslipidemia.   Sonographer:    Quentin Ore RDMS, RVT, RDCS Referring Phys: [id number] Marjie Skiff  IMPRESSIONS    1. Left ventricular ejection fraction, by estimation, is 45 to 50%. The left ventricle has mildly decreased function. The left ventricle has no regional wall motion abnormalities. Left ventricular diastolic parameters are consistent with Grade II diastolic dysfunction (pseudonormalization). The average left  ventricular global longitudinal strain is -13.3 %. The global longitudinal strain is abnormal.  2. Right ventricular systolic function is low normal. The right ventricular size is mildly enlarged. There is mildly elevated pulmonary artery systolic pressure. The estimated right ventricular systolic pressure is 39.3 mmHg.  3. Left atrial size was mildly dilated.  4. Right atrial size was mildly dilated.  5. The mitral valve is normal in structure. Mild mitral valve regurgitation. No evidence of mitral stenosis.  6. The aortic valve was not well visualized. Aortic valve regurgitation is not visualized. No aortic stenosis is present.  7. The inferior vena cava is dilated in size with >50% respiratory variability, suggesting right atrial pressure of 8 mmHg.   03-22-22 1. Successful PVI 2. Successful ablation of the cavotricuspid isthmus for atrial flutter 3. Intracardiac echo reveals trivial pericardial effusion and normal left atrial architecture     Neuro/Psych negative neurological ROS  negative psych ROS   GI/Hepatic negative GI ROS, Neg liver  ROS,,,  Endo/Other  negative endocrine ROS    Renal/GU negative Renal ROS  negative genitourinary   Musculoskeletal  (+) Arthritis ,    Abdominal   Peds  Hematology negative hematology ROS (+)   Anesthesia Other Findings RLS (restless legs syndrome)  Obesity (BMI 30-39.9) Restless legs syndrome (RLS)  Hyperlipidemia Atrial fibrillation (HCC)  Chronic lymphocytic leukemia (HCC)  Grade II diastolic dysfunction Mild Mitral regurg    Reproductive/Obstetrics negative OB ROS                             Anesthesia Physical Anesthesia Plan  ASA: 3  Anesthesia Plan: MAC   Post-op Pain Management:    Induction: Intravenous  PONV Risk Score and Plan:   Airway Management Planned: Natural Airway and Nasal Cannula  Additional Equipment:   Intra-op Plan:   Post-operative Plan:   Informed  Consent: I have reviewed the patients History and Physical, chart, labs and discussed the procedure including the risks, benefits and alternatives for the proposed anesthesia with the patient or authorized representative who has indicated his/her understanding and acceptance.     Dental Advisory Given  Plan Discussed with: Anesthesiologist, CRNA and Surgeon  Anesthesia Plan Comments: (Patient consented for risks of anesthesia including but not limited to:  - adverse reactions to medications - damage to eyes, teeth, lips or other oral mucosa - nerve damage due to positioning  - sore throat or hoarseness - Damage to heart, brain, nerves, lungs, other parts of body or loss of life  Patient voiced understanding and assent.)        Anesthesia Quick Evaluation

## 2023-05-18 ENCOUNTER — Ambulatory Visit
Admission: RE | Admit: 2023-05-18 | Discharge: 2023-05-18 | Disposition: A | Discharge status: Home / Self Care | Payer: Medicare HMO | Attending: Ophthalmology | Admitting: Ophthalmology

## 2023-05-18 ENCOUNTER — Ambulatory Visit: Payer: Medicare HMO | Admitting: Anesthesiology

## 2023-05-18 ENCOUNTER — Other Ambulatory Visit: Payer: Self-pay

## 2023-05-18 ENCOUNTER — Encounter: Payer: Self-pay | Admitting: Ophthalmology

## 2023-05-18 ENCOUNTER — Encounter
Admission: RE | Disposition: A | Discharge status: Home / Self Care | Payer: Self-pay | Source: Home / Self Care | Attending: Ophthalmology

## 2023-05-18 DIAGNOSIS — H2512 Age-related nuclear cataract, left eye: Secondary | ICD-10-CM | POA: Diagnosis not present

## 2023-05-18 DIAGNOSIS — I4891 Unspecified atrial fibrillation: Secondary | ICD-10-CM | POA: Diagnosis not present

## 2023-05-18 HISTORY — DX: Chronic lymphocytic leukemia of B-cell type not having achieved remission: C91.10

## 2023-05-18 HISTORY — DX: Other ill-defined heart diseases: I51.89

## 2023-05-18 HISTORY — DX: Nonrheumatic mitral (valve) insufficiency: I34.0

## 2023-05-18 HISTORY — PX: CATARACT EXTRACTION W/PHACO: SHX586

## 2023-05-18 SURGERY — PHACOEMULSIFICATION, CATARACT, WITH IOL INSERTION
Anesthesia: Monitor Anesthesia Care | Site: Eye | Laterality: Left

## 2023-05-18 MED ORDER — CEFUROXIME OPHTHALMIC INJECTION 1 MG/0.1 ML
INJECTION | OPHTHALMIC | Status: DC | PRN
  Administered 2023-05-18: .1 mL via INTRACAMERAL

## 2023-05-18 MED ORDER — SIGHTPATH DOSE#1 BSS IO SOLN
INTRAOCULAR | Status: DC | PRN
  Administered 2023-05-18: 15 mL

## 2023-05-18 MED ORDER — MIDAZOLAM HCL 2 MG/2ML IJ SOLN
INTRAMUSCULAR | Status: DC | PRN
  Administered 2023-05-18: 2 mg via INTRAVENOUS

## 2023-05-18 MED ORDER — BRIMONIDINE TARTRATE-TIMOLOL 0.2-0.5 % OP SOLN
OPHTHALMIC | Status: DC | PRN
  Administered 2023-05-18: 1 [drp] via OPHTHALMIC

## 2023-05-18 MED ORDER — FENTANYL CITRATE (PF) 100 MCG/2ML IJ SOLN
INTRAMUSCULAR | Status: DC | PRN
  Administered 2023-05-18: 100 ug via INTRAVENOUS

## 2023-05-18 MED ORDER — TETRACAINE HCL 0.5 % OP SOLN
1.0000 [drp] | OPHTHALMIC | Status: DC | PRN
  Administered 2023-05-18 (×3): 1 [drp] via OPHTHALMIC

## 2023-05-18 MED ORDER — FENTANYL CITRATE (PF) 100 MCG/2ML IJ SOLN
INTRAMUSCULAR | Status: AC
  Filled 2023-05-18: qty 2

## 2023-05-18 MED ORDER — TETRACAINE HCL 0.5 % OP SOLN
OPHTHALMIC | Status: AC
  Filled 2023-05-18: qty 4

## 2023-05-18 MED ORDER — SIGHTPATH DOSE#1 NA HYALUR & NA CHOND-NA HYALUR IO KIT
PACK | INTRAOCULAR | Status: DC | PRN
  Administered 2023-05-18: 1 via OPHTHALMIC

## 2023-05-18 MED ORDER — ARMC OPHTHALMIC DILATING DROPS
1.0000 | OPHTHALMIC | Status: DC | PRN
  Administered 2023-05-18 (×3): 1 via OPHTHALMIC

## 2023-05-18 MED ORDER — MIDAZOLAM HCL 2 MG/2ML IJ SOLN
INTRAMUSCULAR | Status: AC
Start: 2023-05-18 — End: ?
  Filled 2023-05-18: qty 2

## 2023-05-18 MED ORDER — SIGHTPATH DOSE#1 BSS IO SOLN
INTRAOCULAR | Status: DC | PRN
  Administered 2023-05-18: 1 mL

## 2023-05-18 MED ORDER — SIGHTPATH DOSE#1 BSS IO SOLN
INTRAOCULAR | Status: DC | PRN
  Administered 2023-05-18: 61 mL via OPHTHALMIC

## 2023-05-18 SURGICAL SUPPLY — 8 items
CATARACT SUITE SIGHTPATH (MISCELLANEOUS) ×1
FEE CATARACT SUITE SIGHTPATH (MISCELLANEOUS) ×1 IMPLANT
GLOVE SRG 8 PF TXTR STRL LF DI (GLOVE) ×1 IMPLANT
GLOVE SURG ENC TEXT LTX SZ7.5 (GLOVE) ×1 IMPLANT
LENS IOL TECNIS EYHANCE 21.5 (Intraocular Lens) IMPLANT
NDL FILTER BLUNT 18X1 1/2 (NEEDLE) ×1 IMPLANT
NEEDLE FILTER BLUNT 18X1 1/2 (NEEDLE) ×1
SYR 3ML LL SCALE MARK (SYRINGE) ×1 IMPLANT

## 2023-05-18 NOTE — Anesthesia Postprocedure Evaluation (Signed)
Anesthesia Post Note  Patient: Erika Anderson [facility]  Procedure(s) Performed: CATARACT EXTRACTION PHACO AND INTRAOCULAR LENS PLACEMENT (IOC) LEFT (Left: Eye)  Patient location during evaluation: PACU Anesthesia Type: MAC Level of consciousness: awake and alert Pain management: pain level controlled Vital Signs Assessment: post-procedure vital signs reviewed and stable Respiratory status: spontaneous breathing, nonlabored ventilation, respiratory function stable and patient connected to nasal cannula oxygen Cardiovascular status: stable and blood pressure returned to baseline Postop Assessment: no apparent nausea or vomiting Anesthetic complications: no   No notable events documented.   Last Vitals:  Vitals:   05/18/23 0855 05/18/23 0859  BP: 121/79 139/78  Pulse: 60   Resp: 18   Temp: (!) 36.1 C (!) 36.1 C  SpO2: 99%     Last Pain:  Vitals:   05/18/23 0859  TempSrc:   PainSc: 0-No pain                 Netty Sullivant C Rhyse Skowron

## 2023-05-18 NOTE — Transfer of Care (Signed)
Immediate Anesthesia Transfer of Care Note  Patient: Erika Anderson [facility]  Procedure(s) Performed: CATARACT EXTRACTION PHACO AND INTRAOCULAR LENS PLACEMENT (IOC) LEFT (Left: Eye)  Patient Location: PACU  Anesthesia Type: MAC  Level of Consciousness: awake, alert  and patient cooperative  Airway and Oxygen Therapy: Patient Spontanous Breathing and Patient connected to supplemental oxygen  Post-op Assessment: Post-op Vital signs reviewed, Patient's Cardiovascular Status Stable, Respiratory Function Stable, Patent Airway and No signs of Nausea or vomiting  Post-op Vital Signs: Reviewed and stable  Complications: No notable events documented.

## 2023-05-18 NOTE — H&P (Signed)
[facility]   Primary Care Physician:  Marjie Skiff, NP Ophthalmologist: Dr. Lockie Mola  Pre-Procedure History & Physical: HPI:  Erika Anderson is a 72 y.o. female here for ophthalmic surgery.   Past Medical History:  Diagnosis Date   Atrial fibrillation (HCC)    Chronic lymphocytic leukemia (HCC)    Hyperlipidemia    Leukemia (HCC)    Obesity (BMI 30-39.9)    Restless legs syndrome (RLS) 12/28/2014   RLS (restless legs syndrome)     Past Surgical History:  Procedure Laterality Date   ATRIAL FIBRILLATION ABLATION N/A 03/22/2022   Procedure: ATRIAL FIBRILLATION ABLATION;  Surgeon: Lanier Prude, MD;  Location: [facility] INVASIVE CV LAB;  Service: Cardiovascular;  Laterality: N/A;   COLONOSCOPY WITH PROPOFOL N/A 08/24/2021   Procedure: COLONOSCOPY WITH PROPOFOL;  Surgeon: Wyline Mood, MD;  Location: [facility] ENDOSCOPY;  Service: Gastroenterology;  Laterality: N/A;   HEMORROIDECTOMY     TOTAL ABDOMINAL HYSTERECTOMY     ovaries remain    Prior to Admission medications   Medication Sig Start Date End Date Taking? Authorizing Provider  betamethasone dipropionate 0.05 % lotion Apply topically daily. 09/14/22  Yes [provider]  citalopram (CELEXA) 20 MG tablet Take 1 tablet by mouth once daily 04/04/23  Yes Cannady, Jolene T, NP  ELIQUIS 5 MG TABS tablet Take 1 tablet by mouth twice daily 03/09/23  Yes Cannady, Jolene T, NP  gabapentin (NEURONTIN) 300 MG capsule Take 2 capsules (600 mg total) by mouth at bedtime. Patient taking differently: Take 300 mg by mouth at bedtime as needed (pain). 01/27/22  Yes Cannady, Jolene T, NP  ketoconazole (NIZORAL) 2 % shampoo Apply topically 3 (three) times a week. 09/14/22  Yes [provider]  metoprolol tartrate (LOPRESSOR) 25 MG tablet Take 1 tablet by mouth twice daily 11/02/22  Yes Cannady, Jolene T, NP  Multiple Vitamins-Minerals (MULTIVITAMIN WITH MINERALS) tablet Take 1 tablet by mouth daily.   Yes [provider]  pantoprazole (PROTONIX) 40 MG tablet Take 1 tablet (40 mg total) by mouth daily. 05/24/22 05/18/23 Yes Agbor-Etang, Arlys John, MD  rOPINIRole (REQUIP) 1 MG tablet Take 1 tablet by mouth 4 times daily 09/07/22  Yes Cannady, Jolene T, NP  rosuvastatin (CRESTOR) 20 MG tablet Take 1 tablet (20 mg total) by mouth daily. 07/16/22  Yes Cannady, Jolene T, NP  traZODone (DESYREL) 50 MG tablet TAKE 1/2 TO 1 (ONE-HALF TO ONE) TABLET BY MOUTH AT BEDTIME AS NEEDED FOR SLEEP 02/10/23  Yes Cannady, Jolene T, NP  zanubrutinib (BRUKINSA) 80 MG capsule Take 2 capsules (160 mg total) by mouth 2 (two) times daily. 02/02/23  Yes [name], MD    Allergies as of 04/28/2023   (No Known Allergies)    Family History  Problem Relation Age of Onset   Cancer Mother        ovarian   Dementia Father    Arthritis Sister    Heart disease Brother        MI   Stroke Paternal Grandmother    Cancer Paternal Grandfather        lung   Breast cancer Neg Hx     Social History   Socioeconomic History   Marital status: Married    Spouse name: Not on file   Number of children: Not on file   Years of education: 12   Highest education level: High school graduate  Occupational History   Occupation: retired  Tobacco Use   Smoking status: Never  Smokeless tobacco: Never   Tobacco comments:    Never smoke 04/19/22  Vaping Use   Vaping status: Never Used  Substance and Sexual Activity   Alcohol use: No    Alcohol/week: 0.0 standard drinks of alcohol   Drug use: No   Sexual activity: Not Currently  Other Topics Concern   Not on file  Social History Narrative   Helps take care of grandchildren    Social Determinants of Health   Financial Resource Strain: Low Risk  (03/08/2023)   Overall Financial Resource Strain (CARDIA)    Difficulty of Paying Living Expenses: Not hard at all  Food Insecurity: No Food Insecurity (03/08/2023)   Hunger Vital Sign    Worried About Running Out of Food in the Last Year: Never  true    Ran Out of Food in the Last Year: Never true  Transportation Needs: No Transportation Needs (03/08/2023)   PRAPARE - Administrator, Civil Service (Medical): No    Lack of Transportation (Non-Medical): No  Physical Activity: Sufficiently Active (03/08/2023)   Exercise Vital Sign    Days of Exercise per Week: 7 days    Minutes of Exercise per Session: 30 min  Stress: No Stress Concern Present (03/08/2023)   [employer] of Occupational Health - Occupational Stress Questionnaire    Feeling of Stress : Not at all  Social Connections: Moderately Integrated (03/08/2023)   Social Connection and Isolation Panel [NHANES]    Frequency of Communication with Friends and Family: More than three times a week    Frequency of Social Gatherings with Friends and Family: Twice a week    Attends Religious Services: More than 4 times per year    Active Member of [employer] or Organizations: No    Attends Banker Meetings: Never    Marital Status: Married  Catering manager Violence: Not At Risk (03/08/2023)   Humiliation, Afraid, Rape, and Kick questionnaire    Fear of Current or Ex-Partner: No    Emotionally Abused: No    Physically Abused: No    Sexually Abused: No    Review of Systems: See HPI, otherwise negative ROS  Physical Exam: BP (!) 143/85   Pulse 60   Temp (!) 97.5 F (36.4 C) (Temporal)   Resp 18   Ht 5\' 6"  (1.676 m)   Wt 115.2 kg   SpO2 95%   BMI 41.00 kg/m  General:   Alert,  pleasant and cooperative in NAD Head:  Normocephalic and atraumatic. Lungs:  Clear to auscultation.    Heart:  Regular rate and rhythm.   Impression/Plan: Erika Anderson is here for ophthalmic surgery.  Risks, benefits, limitations, and alternatives regarding ophthalmic surgery have been reviewed with the patient.  Questions have been answered.  All parties agreeable.   Lockie Mola, MD  05/18/2023, 8:00 AM

## 2023-05-18 NOTE — Op Note (Signed)
OPERATIVE NOTE  Shirle Starkes [id number] 05/18/2023   PREOPERATIVE DIAGNOSIS:  Nuclear sclerotic cataract left eye. H25.12   POSTOPERATIVE DIAGNOSIS:    Nuclear sclerotic cataract left eye.     PROCEDURE:  Phacoemusification with posterior chamber intraocular lens placement of the left eye  Ultrasound time: Procedure(s) with comments: CATARACT EXTRACTION PHACO AND INTRAOCULAR LENS PLACEMENT (IOC) LEFT (Left) - 6.95 0:44.8  LENS:   Implant Name Type Inv. Item Serial No. Manufacturer Lot No. LRB No. Used Action  LENS IOL TECNIS EYHANCE 21.5 - [id number] Intraocular Lens LENS IOL TECNIS EYHANCE 21.5 [id number] SIGHTPATH  Left 1 Implanted      SURGEON:  Deirdre Evener, MD   ANESTHESIA:  Topical with tetracaine drops and 2% Xylocaine jelly, augmented with 1% preservative-free intracameral lidocaine.    COMPLICATIONS:  None.   DESCRIPTION OF PROCEDURE:  The patient was identified in the holding room and transported to the operating room and placed in the supine position under the operating microscope.  The left eye was identified as the operative eye and it was prepped and draped in the usual sterile ophthalmic fashion.   A 1 millimeter clear-corneal paracentesis was made at the 1:30 position.  0.5 ml of preservative-free 1% lidocaine was injected into the anterior chamber.  The anterior chamber was filled with Viscoat viscoelastic.  A 2.4 millimeter keratome was used to make a near-clear corneal incision at the 10:30 position.  .  A curvilinear capsulorrhexis was made with a cystotome and capsulorrhexis forceps.  Balanced salt solution was used to hydrodissect and hydrodelineate the nucleus.   Phacoemulsification was then used in stop and chop fashion to remove the lens nucleus and epinucleus.  The remaining cortex was then removed using the irrigation and aspiration handpiece. Provisc was then placed into the capsular bag to distend it for lens placement.  A lens was then  injected into the capsular bag.  The remaining viscoelastic was aspirated.   Wounds were hydrated with balanced salt solution.  The anterior chamber was inflated to a physiologic pressure with balanced salt solution.  No wound leaks were noted. Cefuroxime 0.1 ml of a 10mg /ml solution was injected into the anterior chamber for a dose of 1 mg of intracameral antibiotic at the completion of the case.   Timolol and Brimonidine drops were applied to the eye.  The patient was taken to the recovery room in stable condition without complications of anesthesia or surgery.  Ranier Coach 05/18/2023, 8:53 AM

## 2023-05-18 NOTE — Anesthesia Preprocedure Evaluation (Addendum)
Anesthesia Evaluation  Patient identified by MRN, date of birth, ID band Patient awake    Reviewed: Allergy & Precautions, H&P , NPO status , Patient's Chart, lab work & pertinent test results  Airway Mallampati: III  TM Distance: <3 FB Neck ROM: Full   Comment: Comment: Very short TMD, 1 FB TMD, likely anterior airway   Snores heavily, tried to have sleep study, but was unable to go to sleep for the study, so was dismissed.   Dental no notable dental hx.    Pulmonary neg pulmonary ROS   Pulmonary exam normal breath sounds clear to auscultation       Cardiovascular negative cardio ROS Normal cardiovascular exam Rhythm:Regular Rate:Normal     Neuro/Psych negative neurological ROS  negative psych ROS   GI/Hepatic negative GI ROS, Neg liver ROS,,,  Endo/Other  negative endocrine ROS    Renal/GU negative Renal ROS  negative genitourinary   Musculoskeletal negative musculoskeletal ROS (+) Arthritis ,    Abdominal   Peds negative pediatric ROS (+)  Hematology negative hematology ROS (+)   Anesthesia Other Findings Previous cataract surgery 05-18-23  Dr. Juel Burrow and Domenic Moras CRNA, Versed 2 mg IV and fentanyl 100 mcg IV previously    RLS (restless legs syndrome) Obesity (BMI 30-39.9) Restless legs syndrome (RLS) Hyperlipidemia Atrial fibrillation (HCC)  Leukemia (HCC) Chronic lymphocytic leukemia (HCC)  Mild mitral regurgitation by prior echocardiogram Grade II diastolic dysfunction     Reproductive/Obstetrics negative OB ROS                             Anesthesia Physical Anesthesia Plan  ASA: 3  Anesthesia Plan: MAC   Post-op Pain Management:    Induction: Intravenous  PONV Risk Score and Plan:   Airway Management Planned: Natural Airway and Nasal Cannula  Additional Equipment:   Intra-op Plan:   Post-operative Plan:   Informed Consent: I have reviewed the  patients History and Physical, chart, labs and discussed the procedure including the risks, benefits and alternatives for the proposed anesthesia with the patient or authorized representative who has indicated his/her understanding and acceptance.     Dental Advisory Given  Plan Discussed with: Anesthesiologist, CRNA and Surgeon  Anesthesia Plan Comments: (Patient consented for risks of anesthesia including but not limited to:  - adverse reactions to medications - damage to eyes, teeth, lips or other oral mucosa - nerve damage due to positioning  - sore throat or hoarseness - Damage to heart, brain, nerves, lungs, other parts of body or loss of life  Patient voiced understanding and assent.)        Anesthesia Quick Evaluation

## 2023-05-19 ENCOUNTER — Encounter: Payer: Self-pay | Admitting: Ophthalmology

## 2023-05-19 DIAGNOSIS — H2511 Age-related nuclear cataract, right eye: Secondary | ICD-10-CM | POA: Diagnosis not present

## 2023-05-23 NOTE — Discharge Instructions (Signed)

## 2023-05-24 MED ORDER — PANTOPRAZOLE SODIUM 40 MG PO TBEC: 40.0000 mg | DELAYED_RELEASE_TABLET | Freq: Every day | ORAL | 3 refills | Status: DC

## 2023-05-25 ENCOUNTER — Ambulatory Visit: Payer: Medicare HMO | Admitting: Anesthesiology

## 2023-05-25 ENCOUNTER — Ambulatory Visit
Admission: RE | Admit: 2023-05-25 | Discharge: 2023-05-25 | Disposition: A | Discharge status: Home / Self Care | Payer: Medicare HMO | Attending: Ophthalmology | Admitting: Ophthalmology

## 2023-05-25 ENCOUNTER — Other Ambulatory Visit: Payer: Self-pay

## 2023-05-25 ENCOUNTER — Encounter: Payer: Self-pay | Admitting: Ophthalmology

## 2023-05-25 ENCOUNTER — Encounter
Admission: RE | Disposition: A | Discharge status: Home / Self Care | Payer: Self-pay | Source: Home / Self Care | Attending: Ophthalmology

## 2023-05-25 DIAGNOSIS — I480 Paroxysmal atrial fibrillation: Secondary | ICD-10-CM | POA: Diagnosis not present

## 2023-05-25 DIAGNOSIS — H2511 Age-related nuclear cataract, right eye: Secondary | ICD-10-CM | POA: Diagnosis not present

## 2023-05-25 HISTORY — PX: CATARACT EXTRACTION W/PHACO: SHX586

## 2023-05-25 SURGERY — PHACOEMULSIFICATION, CATARACT, WITH IOL INSERTION
Anesthesia: Monitor Anesthesia Care | Laterality: Right

## 2023-05-25 MED ORDER — FENTANYL CITRATE (PF) 100 MCG/2ML IJ SOLN
INTRAMUSCULAR | Status: DC | PRN
  Administered 2023-05-25 (×2): 25 ug via INTRAVENOUS
  Administered 2023-05-25: 50 ug via INTRAVENOUS

## 2023-05-25 MED ORDER — MIDAZOLAM HCL 2 MG/2ML IJ SOLN
INTRAMUSCULAR | Status: AC
  Filled 2023-05-25: qty 2

## 2023-05-25 MED ORDER — CEFUROXIME OPHTHALMIC INJECTION 1 MG/0.1 ML
INJECTION | OPHTHALMIC | Status: DC | PRN
  Administered 2023-05-25: 1 mg via INTRACAMERAL

## 2023-05-25 MED ORDER — SIGHTPATH DOSE#1 NA HYALUR & NA CHOND-NA HYALUR IO KIT
PACK | INTRAOCULAR | Status: DC | PRN
  Administered 2023-05-25: 1 via OPHTHALMIC

## 2023-05-25 MED ORDER — TETRACAINE HCL 0.5 % OP SOLN
1.0000 [drp] | OPHTHALMIC | Status: DC | PRN
  Administered 2023-05-25 (×3): 1 [drp] via OPHTHALMIC

## 2023-05-25 MED ORDER — SIGHTPATH DOSE#1 BSS IO SOLN
INTRAOCULAR | Status: DC | PRN
  Administered 2023-05-25: 47 mL via OPHTHALMIC

## 2023-05-25 MED ORDER — TETRACAINE HCL 0.5 % OP SOLN
OPHTHALMIC | Status: AC
  Filled 2023-05-25: qty 4

## 2023-05-25 MED ORDER — SIGHTPATH DOSE#1 BSS IO SOLN
INTRAOCULAR | Status: DC | PRN
  Administered 2023-05-25: 2 mL

## 2023-05-25 MED ORDER — ARMC OPHTHALMIC DILATING DROPS
OPHTHALMIC | Status: AC
  Filled 2023-05-25: qty 0.5

## 2023-05-25 MED ORDER — SIGHTPATH DOSE#1 BSS IO SOLN
INTRAOCULAR | Status: DC | PRN
  Administered 2023-05-25: 15 mL via INTRAOCULAR

## 2023-05-25 MED ORDER — BRIMONIDINE TARTRATE-TIMOLOL 0.2-0.5 % OP SOLN
OPHTHALMIC | Status: DC | PRN
  Administered 2023-05-25: 1 [drp] via OPHTHALMIC

## 2023-05-25 MED ORDER — MIDAZOLAM HCL 2 MG/2ML IJ SOLN
INTRAMUSCULAR | Status: DC | PRN
  Administered 2023-05-25: 2 mg via INTRAVENOUS

## 2023-05-25 MED ORDER — ARMC OPHTHALMIC DILATING DROPS
1.0000 | OPHTHALMIC | Status: DC | PRN
  Administered 2023-05-25 (×3): 1 via OPHTHALMIC

## 2023-05-25 MED ORDER — FENTANYL CITRATE (PF) 100 MCG/2ML IJ SOLN
INTRAMUSCULAR | Status: AC
  Filled 2023-05-25: qty 2

## 2023-05-25 SURGICAL SUPPLY — 8 items
CATARACT SUITE SIGHTPATH (MISCELLANEOUS) ×1
FEE CATARACT SUITE SIGHTPATH (MISCELLANEOUS) ×1 IMPLANT
GLOVE SRG 8 PF TXTR STRL LF DI (GLOVE) ×1 IMPLANT
GLOVE SURG ENC TEXT LTX SZ7.5 (GLOVE) ×1 IMPLANT
LENS IOL TECNIS EYHANCE 22.0 (Intraocular Lens) IMPLANT
NDL FILTER BLUNT 18X1 1/2 (NEEDLE) ×1 IMPLANT
NEEDLE FILTER BLUNT 18X1 1/2 (NEEDLE) ×1
SYR 3ML LL SCALE MARK (SYRINGE) ×1 IMPLANT

## 2023-05-25 NOTE — H&P (Signed)
[facility]   Primary Care Physician:  Marjie Skiff, NP Ophthalmologist: Dr. Lockie Mola  Pre-Procedure History & Physical: HPI:  Erika Anderson is a 72 y.o. female here for ophthalmic surgery.   Past Medical History:  Diagnosis Date   Atrial fibrillation (HCC)    Chronic lymphocytic leukemia (HCC)    Grade II diastolic dysfunction    Hyperlipidemia    Leukemia (HCC)    Mild mitral regurgitation by prior echocardiogram    Obesity (BMI 30-39.9)    Restless legs syndrome (RLS) 12/28/2014   RLS (restless legs syndrome)     Past Surgical History:  Procedure Laterality Date   ATRIAL FIBRILLATION ABLATION N/A 03/22/2022   Procedure: ATRIAL FIBRILLATION ABLATION;  Surgeon: Lanier Prude, MD;  Location: [facility]AS[facility];  Service: Cardiovascular;  Laterality: N/A;   CATARACT EXTRACTION W/PHACO Left 05/18/2023   Procedure: CATARACT EXTRACTION PHACO AND INTRAOCULAR LENS PLACEMENT (IOC) LEFT;  Surgeon: Lockie Mola, MD;  Location: [facility];  Service: Ophthalmology;  Laterality: Left;  6.95 0:44.8   COLONOSCOPY WITH PROPOFOL N/A 08/24/2021   Procedure: COLONOSCOPY WITH PROPOFOL;  Surgeon: Wyline Mood, MD;  Location: [facility] ENDOSCOPY;  Service: Gastroenterology;  Laterality: N/A;   HEMORROIDECTOMY     TOTAL ABDOMINAL HYSTERECTOMY     ovaries remain    Prior to Admission medications   Medication Sig Start Date End Date Taking? Authorizing Provider  betamethasone dipropionate 0.05 % lotion Apply topically daily. 09/14/22  Yes [provider]  citalopram (CELEXA) 20 MG tablet Take 1 tablet by mouth once daily 04/04/23  Yes Cannady, Jolene T, NP  ELIQUIS 5 MG TABS tablet Take 1 tablet by mouth twice daily 03/09/23  Yes Cannady, Jolene T, NP  gabapentin (NEURONTIN) 300 MG capsule Take 2 capsules (600 mg total) by mouth at bedtime. Patient taking differently: Take 300 mg by mouth at bedtime as needed (pain). 01/27/22  Yes Cannady, Jolene T,  NP  ketoconazole (NIZORAL) 2 % shampoo Apply topically 3 (three) times a week. 09/14/22  Yes [provider]  metoprolol tartrate (LOPRESSOR) 25 MG tablet Take 1 tablet by mouth twice daily 11/02/22  Yes Cannady, Jolene T, NP  Multiple Vitamins-Minerals (MULTIVITAMIN WITH MINERALS) tablet Take 1 tablet by mouth daily.   Yes [provider]  pantoprazole (PROTONIX) 40 MG tablet Take 1 tablet (40 mg total) by mouth daily. 05/24/23 07/08/23 Yes Agbor-Etang, Arlys John, MD  rOPINIRole (REQUIP) 1 MG tablet Take 1 tablet by mouth 4 times daily 09/07/22  Yes Cannady, Jolene T, NP  rosuvastatin (CRESTOR) 20 MG tablet Take 1 tablet (20 mg total) by mouth daily. 07/16/22  Yes Cannady, Jolene T, NP  traZODone (DESYREL) 50 MG tablet TAKE 1/2 TO 1 (ONE-HALF TO ONE) TABLET BY MOUTH AT BEDTIME AS NEEDED FOR SLEEP 02/10/23  Yes Cannady, Jolene T, NP  zanubrutinib (BRUKINSA) 80 MG capsule Take 2 capsules (160 mg total) by mouth 2 (two) times daily. 02/02/23  Yes [name], MD    Allergies as of 04/28/2023   (No Known Allergies)    Family History  Problem Relation Age of Onset   Cancer Mother        ovarian   Dementia Father    Arthritis Sister    Heart disease Brother        MI   Stroke Paternal Grandmother    Cancer Paternal Grandfather        lung   Breast cancer Neg Hx     Social History  Socioeconomic History   Marital status: Married    Spouse name: Not on file   Number of children: Not on file   Years of education: 12   Highest education level: High school graduate  Occupational History   Occupation: retired  Tobacco Use   Smoking status: Never   Smokeless tobacco: Never   Tobacco comments:    Never smoke 04/19/22  Vaping Use   Vaping status: Never Used  Substance and Sexual Activity   Alcohol use: No    Alcohol/week: 0.0 standard drinks of alcohol   Drug use: No   Sexual activity: Not Currently  Other Topics Concern   Not on file  Social History Narrative   Helps take  care of grandchildren    Social Drivers of Health   Financial Resource Strain: Low Risk  (03/08/2023)   Overall Financial Resource Strain (CARDIA)    Difficulty of Paying Living Expenses: Not hard at all  Food Insecurity: No Food Insecurity (03/08/2023)   Hunger Vital Sign    Worried About Running Out of Food in the Last Year: Never true    Ran Out of Food in the Last Year: Never true  Transportation Needs: No Transportation Needs (03/08/2023)   PRAPARE - Administrator, Civil Service (Medical): No    Lack of Transportation (Non-Medical): No  Physical Activity: Sufficiently Active (03/08/2023)   Exercise Vital Sign    Days of Exercise per Week: 7 days    Minutes of Exercise per Session: 30 min  Stress: No Stress Concern Present (03/08/2023)   [employer] of Occupational Health - Occupational Stress Questionnaire    Feeling of Stress : Not at all  Social Connections: Moderately Integrated (03/08/2023)   Social Connection and Isolation Panel [NHANES]    Frequency of Communication with Friends and Family: More than three times a week    Frequency of Social Gatherings with Friends and Family: Twice a week    Attends Religious Services: More than 4 times per year    Active Member of [employer] or Organizations: No    Attends Banker Meetings: Never    Marital Status: Married  Catering manager Violence: Not At Risk (03/08/2023)   Humiliation, Afraid, Rape, and Kick questionnaire    Fear of Current or Ex-Partner: No    Emotionally Abused: No    Physically Abused: No    Sexually Abused: No    Review of Systems: See HPI, otherwise negative ROS  Physical Exam: Ht 5\' 6"  (1.676 m)   Wt 115.2 kg   BMI 41.00 kg/m  General:   Alert,  pleasant and cooperative in NAD Head:  Normocephalic and atraumatic. Lungs:  Clear to auscultation.    Heart:  Regular rate and rhythm.   Impression/Plan: Erika Anderson is here for ophthalmic surgery.  Risks, benefits,  limitations, and alternatives regarding ophthalmic surgery have been reviewed with the patient.  Questions have been answered.  All parties agreeable.   Lockie Mola, MD  05/25/2023, 10:25 AM

## 2023-05-25 NOTE — Transfer of Care (Signed)
Immediate Anesthesia Transfer of Care Note  Patient: Erika Anderson [facility]  Procedure(s) Performed: CATARACT EXTRACTION PHACO AND INTRAOCULAR LENS PLACEMENT (IOC) RIGHT 5.35 00:35.4 (Right)  Patient Location: PACU  Anesthesia Type: MAC  Level of Consciousness: awake, alert  and patient cooperative  Airway and Oxygen Therapy: Patient Spontanous Breathing and Patient connected to supplemental oxygen  Post-op Assessment: Post-op Vital signs reviewed, Patient's Cardiovascular Status Stable, Respiratory Function Stable, Patent Airway and No signs of Nausea or vomiting  Post-op Vital Signs: Reviewed and stable  Complications: No notable events documented.

## 2023-05-25 NOTE — Op Note (Signed)
LOCATION:  [facility]   PREOPERATIVE DIAGNOSIS:    Nuclear sclerotic cataract right eye. H25.11   POSTOPERATIVE DIAGNOSIS:  Nuclear sclerotic cataract right eye.     PROCEDURE:  Phacoemusification with posterior chamber intraocular lens placement of the right eye   ULTRASOUND TIME: Procedure(s): CATARACT EXTRACTION PHACO AND INTRAOCULAR LENS PLACEMENT (IOC) RIGHT 5.35 00:35.4 (Right)  LENS:   Implant Name Type Inv. Item Serial No. Manufacturer Lot No. LRB No. Used Action  LENS IOL TECNIS EYHANCE 22.0 - [id number] Intraocular Lens LENS IOL TECNIS EYHANCE 22.0 [id number] [employer]  Right 1 Implanted         SURGEON:  Deirdre Evener, MD   ANESTHESIA:  Topical with tetracaine drops and 2% Xylocaine jelly, augmented with 1% preservative-free intracameral lidocaine.    COMPLICATIONS:  None.   DESCRIPTION OF PROCEDURE:  The patient was identified in the holding room and transported to the operating room and placed in the supine position under the operating microscope.  The right eye was identified as the operative eye and it was prepped and draped in the usual sterile ophthalmic fashion.   A 1 millimeter clear-corneal paracentesis was made at the 12:00 position.  0.5 ml of preservative-free 1% lidocaine was injected into the anterior chamber. The anterior chamber was filled with Viscoat viscoelastic.  A 2.4 millimeter keratome was used to make a near-clear corneal incision at the 9:00 position.  A curvilinear capsulorrhexis was made with a cystotome and capsulorrhexis forceps.  Balanced salt solution was used to hydrodissect and hydrodelineate the nucleus.   Phacoemulsification was then used in stop and chop fashion to remove the lens nucleus and epinucleus.  The remaining cortex was then removed using the irrigation and aspiration handpiece. Provisc was then placed into the capsular bag to distend it for lens placement.  A lens was then injected into the capsular bag.  The  remaining viscoelastic was aspirated.   Wounds were hydrated with balanced salt solution.  The anterior chamber was inflated to a physiologic pressure with balanced salt solution.  No wound leaks were noted. Cefuroxime 0.1 ml of a 10mg /ml solution was injected into the anterior chamber for a dose of 1 mg of intracameral antibiotic at the completion of the case.   Timolol and Brimonidine drops were applied to the eye.  The patient was taken to the recovery room in stable condition without complications of anesthesia or surgery.   Erika Anderson 05/25/2023, 11:23 AM

## 2023-05-25 NOTE — Anesthesia Postprocedure Evaluation (Signed)
Anesthesia Post Note  Patient: Erika Anderson  Procedure(s) Performed: CATARACT EXTRACTION PHACO AND INTRAOCULAR LENS PLACEMENT (IOC) RIGHT 5.35 00:35.4 (Right)  Patient location during evaluation: PACU Anesthesia Type: MAC Level of consciousness: awake and alert Pain management: pain level controlled Vital Signs Assessment: post-procedure vital signs reviewed and stable Respiratory status: spontaneous breathing, nonlabored ventilation, respiratory function stable and patient connected to nasal cannula oxygen Cardiovascular status: stable and blood pressure returned to baseline Postop Assessment: no apparent nausea or vomiting Anesthetic complications: no   No notable events documented.   Last Vitals:  Vitals:   05/25/23 1124 05/25/23 1129  BP: 122/74 130/66  Pulse: (!) 59 (!) 58  Resp: 18 13  Temp: (!) 36.4 C 36.4 C  SpO2: 100% 95%    Last Pain:  Vitals:   05/25/23 1129  TempSrc:   PainSc: 0-No pain                 Zailey Audia C Hanako Tipping

## 2023-05-26 ENCOUNTER — Encounter: Payer: Self-pay | Admitting: Ophthalmology

## 2023-05-30 ENCOUNTER — Other Ambulatory Visit: Payer: Self-pay | Admitting: Oncology

## 2023-05-30 ENCOUNTER — Other Ambulatory Visit: Payer: Self-pay

## 2023-05-30 ENCOUNTER — Other Ambulatory Visit (HOSPITAL_COMMUNITY): Payer: Self-pay

## 2023-05-30 DIAGNOSIS — C9110 Chronic lymphocytic leukemia of B-cell type not having achieved remission: Secondary | ICD-10-CM

## 2023-05-30 MED ORDER — BRUKINSA 80 MG PO CAPS
160.0000 mg | ORAL_CAPSULE | Freq: Two times a day (BID) | ORAL | 3 refills | Status: DC
  Filled 2023-06-06: qty 120, 30d supply, fill #0
  Filled 2023-07-05: qty 120, 30d supply, fill #1

## 2023-05-30 NOTE — Progress Notes (Signed)
Specialty Pharmacy Refill Coordination Note  Erika Anderson is a 72 y.o. female contacted today regarding refills of specialty medication(s) Zanubrutinib (Brukinsa)   Patient requested Delivery   Delivery date: 06/13/23   Verified address: [address]   [place] [address]   Medication will be filled on 06/10/23 pending a refill request.

## 2023-05-30 NOTE — Telephone Encounter (Signed)
Component Ref Range & Units (hover) 1 mo ago (04/15/23) 4 mo ago (01/07/23) 7 mo ago (10/07/22) 10 mo ago (07/08/22) 11 mo ago (06/10/22) 1 yr ago (05/03/22) 1 yr ago (03/15/22)  WBC Count 9.7 8.2 10.6 High  21.5 High  27.3 High  25.9 High  18.2 High   RBC 4.52 4.08 4.34 4.39 4.30 4.41 4.48  Hemoglobin 13.5 12.6 13.3 13.4 13.2 13.5 13.8  HCT 41.2 37.6 40.3 40.1 39.4 41.2 40.5  MCV 91.2 92.2 92.9 91.3 91.6 93.4 90.4  MCH 29.9 30.9 30.6 30.5 30.7 30.6 30.8  MCHC 32.8 33.5 33.0 33.4 33.5 32.8 34.1  RDW 13.2 12.7 12.8 12.6 12.5 13.0 13.1  Platelet Count 217 192 202 198 190 187 208  nRBC 0.0 0.0 0.0 0.1 0.0 0.1 0.0  Neutrophils Relative % 56 49 46 17 10 17 28   Neutro Abs 5.5 4.0 4.9 3.6 2.6 4.3 5.1  Lymphocytes Relative 33 40 45 78 88 80 65  Lymphs Abs 3.2 3.3 4.9 High  17.0 High  23.8 High  20.6 High  11.9 High   Monocytes Relative 9 8 7 4 2 3 5   Monocytes Absolute 0.9 0.7 0.7 0.8 0.6 0.7 1.0  Eosinophils Relative 1 2 1 1  0 0 1  Eosinophils Absolute 0.1 0.1 0.1 0.1 0.1 0.1 0.2  Basophils Relative 1 1 1  0 0 0 1  Basophils Absolute 0.1 0.1 0.1 0.1 0.1 0.1 0.1  Immature Granulocytes 0 0 0 0 0 0 0  Abs Immature Granulocytes 0.02 0.02 CM 0.03 CM 0.02 CM 0.02 CM 0.04 CM 0.03 CM  Comment: Performed at [facility], [address] [place], [place] [address]  WBC Morphology    ABSOLUTE LYMPHOCYTOSIS CM DIFF CONFIRMED BY MANUAL.  CONSISTANT WITH KNOWN CLL. ABSOLUTE LYMPHOCYTOSIS CM   RBC Morphology    UNREMARKABLE UNREMARKABLE UNREMARKABLE   Smear Review    Normal platelet morphology CM Normal platelet morphology CM Normal platelet morphology CM   Resulting Agency [facility]        Specimen Collected: 04/15/23 09:44 Last Resulted: 04/15/23 09:53   Component Ref Range & Units (hover) 1 mo ago (04/15/23) 4 mo ago (01/13/23) 4 mo ago (01/07/23) 7 mo ago (10/07/22) 10 mo ago (07/15/22) 10 mo ago (07/08/22) 11 mo ago (06/10/22)   Sodium 138 141 R 140 140 144 R 141 137  Potassium 4.9 4.1 R 4.6 4.7 3.7 R 4.0 4.5  Chloride 104 103 R 104 102 104 R 104 104  CO2 29 25 R 28 30 26  R 28 25  Glucose, Bld 99 122 High  110 High  CM 107 High  CM 86 100 High  CM 97 CM  Comment: Glucose reference range applies only to samples taken after fasting for at least 8 hours.  BUN 23 14 R 15 24 High  13 R 19 19  Creatinine 0.91 0.87 R 0.96 0.99 0.80 R 0.89 0.89  Calcium 9.2 9.4 R 9.3 9.4 9.4 R 9.1 8.9  Total Protein 7.0 6.3 R 6.9 7.0 6.6 R 7.2 7.2  Albumin 4.1 4.2 R 3.9 4.1 4.3 R 4.0 4.0  AST 19 19 R 22 25 17  R 29 23  ALT 17 16 R 20 24 19  R 29 23  Alkaline Phosphatase 67 85 R 65 72 99 R 76 76  Total Bilirubin 0.8 0.4 R 0.9 R 0.6 R  0.4 R 0.7 R 0.6 R  GFR, Estimated >60  >60 CM      Comment: (NOTE) Calculated using the CKD-EPI Creatinine Equation (2021)  Anion gap 5  8 CM 8 CM  9 CM 8 CM  Comment: Performed at [facility], [address] [place], [place] [address]  Resulting Agency [facility]        Specimen Collected: 04/15/23 09:44 Last Resulted: 04/15/23 10:08          Component Ref Range & Units (hover) 1 mo ago 1 yr ago  LDH 145 192 CM  Comment: Performed at [facility], [address] [place], [place] [address]  Resulting Agency [facility]        Specimen Collected: 04/15/23 09:44 Last Resulted: 04/15/23 10:08

## 2023-06-03 ENCOUNTER — Other Ambulatory Visit: Payer: Self-pay

## 2023-06-04 ENCOUNTER — Other Ambulatory Visit: Payer: Self-pay | Admitting: Nurse Practitioner

## 2023-06-06 ENCOUNTER — Telehealth: Payer: Medicare HMO | Admitting: Physician Assistant

## 2023-06-06 ENCOUNTER — Ambulatory Visit: Payer: Self-pay

## 2023-06-06 ENCOUNTER — Other Ambulatory Visit: Payer: Self-pay

## 2023-06-06 DIAGNOSIS — B9689 Other specified bacterial agents as the cause of diseases classified elsewhere: Secondary | ICD-10-CM | POA: Diagnosis not present

## 2023-06-06 DIAGNOSIS — J069 Acute upper respiratory infection, unspecified: Secondary | ICD-10-CM

## 2023-06-06 MED ORDER — BENZONATATE 100 MG PO CAPS: 100.0000 mg | ORAL_CAPSULE | Freq: Three times a day (TID) | ORAL | 0 refills | Status: DC | PRN

## 2023-06-06 MED ORDER — AZITHROMYCIN 250 MG PO TABS: ORAL_TABLET | ORAL | 0 refills | Status: AC

## 2023-06-06 MED ORDER — PREDNISONE 20 MG PO TABS: 40.0000 mg | ORAL_TABLET | Freq: Every day | ORAL | 0 refills | Status: DC

## 2023-06-06 NOTE — Patient Instructions (Signed)
[name], thank you for joining Margaretann Loveless, PA-C for today's virtual visit.  While this provider is not your primary care provider (PCP), if your PCP is located in our provider database this encounter information will be shared with them immediately following your visit.   A [facility] MyChart account gives you access to today's visit and all your visits, tests, and labs performed at [facility] " click here if you don't have a  MyChart account or go to [email or web address]  Consent: (Patient) [name] provided verbal consent for this virtual visit at the beginning of the encounter.  Current Medications:  Current Outpatient Medications:    azithromycin (ZITHROMAX) 250 MG tablet, Take 2 tablets on day 1, then 1 tablet daily on days 2 through 5, Disp: 6 tablet, Rfl: 0   benzonatate (TESSALON) 100 MG capsule, Take 1-2 capsules (100-200 mg total) by mouth 3 (three) times daily as needed., Disp: 30 capsule, Rfl: 0   predniSONE (DELTASONE) 20 MG tablet, Take 2 tablets (40 mg total) by mouth daily with breakfast., Disp: 10 tablet, Rfl: 0   betamethasone dipropionate 0.05 % lotion, Apply topically daily., Disp: , Rfl:    citalopram (CELEXA) 20 MG tablet, Take 1 tablet by mouth once daily, Disp: 90 tablet, Rfl: 0   ELIQUIS 5 MG TABS tablet, Take 1 tablet by mouth twice daily, Disp: 180 tablet, Rfl: 0   gabapentin (NEURONTIN) 300 MG capsule, Take 2 capsules (600 mg total) by mouth at bedtime. (Patient taking differently: Take 300 mg by mouth at bedtime as needed (pain).), Disp: 180 capsule, Rfl: 4   ketoconazole (NIZORAL) 2 % shampoo, Apply topically 3 (three) times a week., Disp: , Rfl:    metoprolol tartrate (LOPRESSOR) 25 MG tablet, Take 1 tablet by mouth twice daily, Disp: 180 tablet, Rfl: 4   Multiple Vitamins-Minerals (MULTIVITAMIN WITH MINERALS) tablet, Take 1 tablet by mouth daily., Disp: , Rfl:    pantoprazole (PROTONIX) 40 MG tablet,  Take 1 tablet (40 mg total) by mouth daily., Disp: 90 tablet, Rfl: 3   rOPINIRole (REQUIP) 1 MG tablet, Take 1 tablet by mouth 4 times daily, Disp: 360 tablet, Rfl: 4   rosuvastatin (CRESTOR) 20 MG tablet, Take 1 tablet (20 mg total) by mouth daily., Disp: 90 tablet, Rfl: 4   traZODone (DESYREL) 50 MG tablet, TAKE 1/2 TO 1 (ONE-HALF TO ONE) TABLET BY MOUTH AT BEDTIME AS NEEDED FOR SLEEP, Disp: 90 tablet, Rfl: 4   zanubrutinib (BRUKINSA) 80 MG capsule, Take 2 capsules (160 mg total) by mouth 2 (two) times daily., Disp: 120 capsule, Rfl: 3   Medications ordered in this encounter:  Meds ordered this encounter  Medications   azithromycin (ZITHROMAX) 250 MG tablet    Sig: Take 2 tablets on day 1, then 1 tablet daily on days 2 through 5    Dispense:  6 tablet    Refill:  0    Supervising Provider:   Merrilee Jansky [[id number]]   predniSONE (DELTASONE) 20 MG tablet    Sig: Take 2 tablets (40 mg total) by mouth daily with breakfast.    Dispense:  10 tablet    Refill:  0    Supervising Provider:   Merrilee Jansky [[id number]]   benzonatate (TESSALON) 100 MG capsule    Sig: Take 1-2 capsules (100-200 mg total) by mouth 3 (three) times daily as needed.    Dispense:  30 capsule    Refill:  0    Supervising  Provider:   Merrilee Jansky [[id number]]     *If you need refills on other medications prior to your next appointment, please contact your pharmacy*  Follow-Up: Call back or seek an in-person evaluation if the symptoms worsen or if the condition fails to improve as anticipated.  [facility] [phone]  Other Instructions Upper Respiratory Infection, Adult An upper respiratory infection (URI) is a common viral infection of the nose, throat, and upper air passages that lead to the lungs. The most common type of URI is the common cold. URIs usually get better on their own, without medical treatment. What are the causes? A URI is caused by a virus. You may catch a virus  by: Breathing in droplets from an infected person's cough or sneeze. Touching something that has been exposed to the virus (is contaminated) and then touching your mouth, nose, or eyes. What increases the risk? You are more likely to get a URI if: You are very young or very old. You have close contact with others, such as at work, school, or a health care facility. You smoke. You have long-term (chronic) heart or lung disease. You have a weakened disease-fighting system (immune system). You have nasal allergies or asthma. You are experiencing a lot of stress. You have poor nutrition. What are the signs or symptoms? A URI usually involves some of the following symptoms: Runny or stuffy (congested) nose. Cough. Sneezing. Sore throat. Headache. Fatigue. Fever. Loss of appetite. Pain in your forehead, behind your eyes, and over your cheekbones (sinus pain). Muscle aches. Redness or irritation of the eyes. Pressure in the ears or face. How is this diagnosed? This condition may be diagnosed based on your medical history and symptoms, and a physical exam. Your health care provider may use a swab to take a mucus sample from your nose (nasal swab). This sample can be tested to determine what virus is causing the illness. How is this treated? URIs usually get better on their own within 7-10 days. Medicines cannot cure URIs, but your health care provider may recommend certain medicines to help relieve symptoms, such as: Over-the-counter cold medicines. Cough suppressants. Coughing is a type of defense against infection that helps to clear the respiratory system, so take these medicines only as recommended by your health care provider. Fever-reducing medicines. Follow these instructions at home: Activity Rest as needed. If you have a fever, stay home from work or school until your fever is gone or until your health care provider says your URI cannot spread to other people (is no longer  contagious). Your health care provider may have you wear a face mask to prevent your infection from spreading. Relieving symptoms Gargle with a mixture of salt and water 3-4 times a day or as needed. To make salt water, completely dissolve -1 tsp (3-6 g) of salt in 1 cup (237 mL) of warm water. Use a cool-mist humidifier to add moisture to the air. This can help you breathe more easily. Eating and drinking  Drink enough fluid to keep your urine pale yellow. Eat soups and other clear broths. General instructions  Take over-the-counter and prescription medicines only as told by your health care provider. These include cold medicines, fever reducers, and cough suppressants. Do not use any products that contain nicotine or tobacco. These products include cigarettes, chewing tobacco, and vaping devices, such as e-cigarettes. If you need help quitting, ask your health care provider. Stay away from secondhand smoke. Stay up to date on  all immunizations, including the yearly (annual) flu vaccine. Keep all follow-up visits. This is important. How to prevent the spread of infection to others URIs can be contagious. To prevent the infection from spreading: Wash your hands with soap and water for at least 20 seconds. If soap and water are not available, use hand sanitizer. Avoid touching your mouth, face, eyes, or nose. Cough or sneeze into a tissue or your sleeve or elbow instead of into your hand or into the air.  Contact a health care provider if: You are getting worse instead of better. You have a fever or chills. Your mucus is brown or red. You have yellow or brown discharge coming from your nose. You have pain in your face, especially when you bend forward. You have swollen neck glands. You have pain while swallowing. You have white areas in the back of your throat. Get help right away if: You have shortness of breath that gets worse. You have severe or persistent: Headache. Ear  pain. Sinus pain. Chest pain. You have chronic lung disease along with any of the following: Making high-pitched whistling sounds when you breathe, most often when you breathe out (wheezing). Prolonged cough (more than 14 days). Coughing up blood. A change in your usual mucus. You have a stiff neck. You have changes in your: Vision. Hearing. Thinking. Mood. These symptoms may be an emergency. Get help right away. Call 911. Do not wait to see if the symptoms will go away. Do not drive yourself to the hospital. Summary An upper respiratory infection (URI) is a common infection of the nose, throat, and upper air passages that lead to the lungs. A URI is caused by a virus. URIs usually get better on their own within 7-10 days. Medicines cannot cure URIs, but your health care provider may recommend certain medicines to help relieve symptoms. This information is not intended to replace advice given to you by your health care provider. Make sure you discuss any questions you have with your health care provider. Document Revised: 12/24/2020 Document Reviewed: 12/24/2020 Elsevier Patient Education  2024 Elsevier Inc.    If you have been instructed to have an in-person evaluation today at a local Urgent Care facility, please use the link below. It will take you to a list of all of our available [facility], including address, phone number and hours of operation. Please do not delay care.  [facility]  If you or a family member do not have a primary care provider, use the link below to schedule a visit and establish care. When you choose a [facility] primary care physician or advanced practice provider, you gain a long-term partner in health. Find a Primary Care Provider  Learn more about [facility]'s in-office and virtual care options: [facility] - Get Care Now

## 2023-06-06 NOTE — Telephone Encounter (Signed)
Message from Covel S sent at 06/06/2023  8:36 AM EST  Summary: Congestion, cough, sore throat   The patient called in stating she has had congestion, cough, and sore throat since early this weekend. She also states she gets winded fairly easily after walking a short ways. Please assist patient further         Chief Complaint: cough, green phlegm  Symptoms: wheezing, mild to mod SOB Frequency: Sat Pertinent Negatives: Patient denies fever, chest pain Disposition: [] ED /[] Urgent Care (no appt availability in office) / [] Appointment(In office/virtual)/ [x]  [facility]/ [] Home Care/ [] Refused Recommended Disposition /[] [facility]/ []  Follow-up with PCP Additional Notes: refused UC no appt- schedule Virtual UC with advce to go to UC if worsesns before appt  Reason for Disposition  Wheezing is present  Answer Assessment - Initial Assessment Questions 1. ONSET: "When did the cough begin?"      Sat 2. SEVERITY: "How bad is the cough today?"      frequent 3. SPUTUM: "Describe the color of your sputum" (none, dry cough; clear, white, yellow, green)     Yellow green 4. HEMOPTYSIS: "Are you coughing up any blood?" If so ask: "How much?" (flecks, streaks, tablespoons, etc.)     Yesterday- flicks  5. DIFFICULTY BREATHING: "Are you having difficulty breathing?" If Yes, ask: "How bad is it?" (e.g., mild, moderate, severe)    - MILD: No SOB at rest, mild SOB with walking, speaks normally in sentences, can lie down, no retractions, pulse < 100.    - MODERATE: SOB at rest, SOB with minimal exertion and prefers to sit, cannot lie down flat, speaks in phrases, mild retractions, audible wheezing, pulse 100-120.    - SEVERE: Very SOB at rest, speaks in single words, struggling to breathe, sitting hunched forward, retractions, pulse > 120      moderate 6. FEVER: "Do you have a fever?" If Yes, ask: "What is your temperature, how was it measured, and when did it start?"     no 10.  OTHER SYMPTOMS: "Do you have any other symptoms?" (e.g., runny nose, wheezing, chest pain)       Runny nose, wheezing, sore throat  Protocols used: Cough - Acute Non-Productive-A-AH

## 2023-06-06 NOTE — Telephone Encounter (Signed)
Patient has virtual Telehealth  appt today 12-30 @ 4:30pm

## 2023-06-06 NOTE — Progress Notes (Signed)
Virtual Visit Consent   Erika Anderson [facility], you are scheduled for a virtual visit with a [facility] provider today. Just as with appointments in the office, your consent must be obtained to participate. Your consent will be active for this visit and any virtual visit you may have with one of our providers in the next 365 days. If you have a MyChart account, a copy of this consent can be sent to you electronically.  As this is a virtual visit, video technology does not allow for your provider to perform a traditional examination. This may limit your provider's ability to fully assess your condition. If your provider identifies any concerns that need to be evaluated in person or the need to arrange testing (such as labs, EKG, etc.), we will make arrangements to do so. Although advances in technology are sophisticated, we cannot ensure that it will always work on either your end or our end. If the connection with a video visit is poor, the visit may have to be switched to a telephone visit. With either a video or telephone visit, we are not always able to ensure that we have a secure connection.  By engaging in this virtual visit, you consent to the provision of healthcare and authorize for your insurance to be billed (if applicable) for the services provided during this visit. Depending on your insurance coverage, you may receive a charge related to this service.  I need to obtain your verbal consent now. Are you willing to proceed with your visit today? Erika Anderson has provided verbal consent on 06/06/2023 for a virtual visit (video or telephone). Margaretann Loveless, PA-C  Date: 06/06/2023 4:30 PM  Virtual Visit via Video Note   I, Margaretann Loveless, connected with  Erika Anderson  ([id number], May 27, 1951) on 06/06/23 at  4:30 PM EST by a video-enabled telemedicine application and verified that I am speaking with the correct person using two identifiers.  Location: Patient: Virtual  Visit Location Patient: Home Provider: Virtual Visit Location Provider: Home Office   I discussed the limitations of evaluation and management by telemedicine and the availability of in person appointments. The patient expressed understanding and agreed to proceed.    History of Present Illness: Erika Anderson is a 72 y.o. who identifies as a female who was assigned female at birth, and is being seen today for cough.  HPI: Cough This is a new problem. The current episode started in the past 7 days. The problem has been gradually worsening. The problem occurs every few minutes. The cough is Productive of sputum and productive of purulent sputum. Associated symptoms include headaches, nasal congestion, postnasal drip, rhinorrhea, shortness of breath (reports mild but able to speak in full sentences without stopping) and wheezing. Pertinent negatives include no chest pain, chills, ear congestion, ear pain, fever, myalgias or sore throat. Associated symptoms comments: fatigue. The symptoms are aggravated by lying down and cold air. Treatments tried: mucinex. The treatment provided no relief. There is no history of asthma, bronchitis or pneumonia.   PMH: CLL, A. Fib  Problems:  Patient Active Problem List   Diagnosis Date Noted   Lymphedema 09/21/2022   Encounter for antineoplastic chemotherapy 05/03/2022   Hypercoagulable state due to paroxysmal atrial fibrillation (HCC) 04/19/2022   Goals of care, counseling/discussion 04/01/2022   CLL (chronic lymphocytic leukemia) (HCC) 03/02/2022   Vitamin D deficiency 01/24/2022   Morbid obesity (HCC) 11/27/2021   Atrial fibrillation (HCC) 11/27/2021   H/O compression fracture of  spine 07/26/2021   Chronic bilateral low back pain without sciatica 07/28/2020   Aortic atherosclerosis (HCC) 01/17/2020   BMI 40.0-44.9, adult (HCC) 01/17/2020   Insomnia 07/30/2019   Cyst of joint of left hand 07/30/2019   Osteoarthritis of both knees 04/09/2019    Hyperlipidemia 02/04/2017   Advanced care planning/counseling discussion 01/11/2017   RLS (restless legs syndrome) 12/28/2014    Allergies: No Known Allergies Medications:  Current Outpatient Medications:    azithromycin (ZITHROMAX) 250 MG tablet, Take 2 tablets on day 1, then 1 tablet daily on days 2 through 5, Disp: 6 tablet, Rfl: 0   benzonatate (TESSALON) 100 MG capsule, Take 1-2 capsules (100-200 mg total) by mouth 3 (three) times daily as needed., Disp: 30 capsule, Rfl: 0   predniSONE (DELTASONE) 20 MG tablet, Take 2 tablets (40 mg total) by mouth daily with breakfast., Disp: 10 tablet, Rfl: 0   betamethasone dipropionate 0.05 % lotion, Apply topically daily., Disp: , Rfl:    citalopram (CELEXA) 20 MG tablet, Take 1 tablet by mouth once daily, Disp: 90 tablet, Rfl: 0   ELIQUIS 5 MG TABS tablet, Take 1 tablet by mouth twice daily, Disp: 180 tablet, Rfl: 0   gabapentin (NEURONTIN) 300 MG capsule, Take 2 capsules (600 mg total) by mouth at bedtime. (Patient taking differently: Take 300 mg by mouth at bedtime as needed (pain).), Disp: 180 capsule, Rfl: 4   ketoconazole (NIZORAL) 2 % shampoo, Apply topically 3 (three) times a week., Disp: , Rfl:    metoprolol tartrate (LOPRESSOR) 25 MG tablet, Take 1 tablet by mouth twice daily, Disp: 180 tablet, Rfl: 4   Multiple Vitamins-Minerals (MULTIVITAMIN WITH MINERALS) tablet, Take 1 tablet by mouth daily., Disp: , Rfl:    pantoprazole (PROTONIX) 40 MG tablet, Take 1 tablet (40 mg total) by mouth daily., Disp: 90 tablet, Rfl: 3   rOPINIRole (REQUIP) 1 MG tablet, Take 1 tablet by mouth 4 times daily, Disp: 360 tablet, Rfl: 4   rosuvastatin (CRESTOR) 20 MG tablet, Take 1 tablet (20 mg total) by mouth daily., Disp: 90 tablet, Rfl: 4   traZODone (DESYREL) 50 MG tablet, TAKE 1/2 TO 1 (ONE-HALF TO ONE) TABLET BY MOUTH AT BEDTIME AS NEEDED FOR SLEEP, Disp: 90 tablet, Rfl: 4   zanubrutinib (BRUKINSA) 80 MG capsule, Take 2 capsules (160 mg total) by mouth 2  (two) times daily., Disp: 120 capsule, Rfl: 3  Observations/Objective: Patient is well-developed, well-nourished in no acute distress.  Resting comfortably at home.  Head is normocephalic, atraumatic.  No labored breathing.  Speech is clear and coherent with logical content.  Patient is alert and oriented at baseline.    Assessment and Plan: 1. Bacterial upper respiratory infection (Primary) - azithromycin (ZITHROMAX) 250 MG tablet; Take 2 tablets on day 1, then 1 tablet daily on days 2 through 5  Dispense: 6 tablet; Refill: 0 - predniSONE (DELTASONE) 20 MG tablet; Take 2 tablets (40 mg total) by mouth daily with breakfast.  Dispense: 10 tablet; Refill: 0 - benzonatate (TESSALON) 100 MG capsule; Take 1-2 capsules (100-200 mg total) by mouth 3 (three) times daily as needed.  Dispense: 30 capsule; Refill: 0  - Worsening over a week despite OTC medications - Will treat with Z-pack, Prednisone, and tessalon perles - Can continue Mucinex  - Push fluids.  - Rest.  - Steam and humidifier can help - Seek in person evaluation if worsening or symptoms fail to improve    Follow Up Instructions: I discussed the assessment and treatment  plan with the patient. The patient was provided an opportunity to ask questions and all were answered. The patient agreed with the plan and demonstrated an understanding of the instructions.  A copy of instructions were sent to the patient via MyChart unless otherwise noted below.    The patient was advised to call back or seek an in-person evaluation if the symptoms worsen or if the condition fails to improve as anticipated.    Margaretann Loveless, PA-C

## 2023-06-09 DIAGNOSIS — Z008 Encounter for other general examination: Secondary | ICD-10-CM | POA: Diagnosis not present

## 2023-06-09 NOTE — Telephone Encounter (Signed)
 Requested Prescriptions  Pending Prescriptions Disp Refills   ELIQUIS  5 MG TABS tablet [Pharmacy Med Name: Eliquis  5 MG Oral Tablet] 180 tablet 0    Sig: Take 1 tablet by mouth twice daily     Hematology:  Anticoagulants - apixaban  Passed - 06/09/2023 10:06 AM      Passed - PLT in normal range and within 360 days    Platelets  Date Value Ref Range Status  02/19/2022 199 150 - 450 x10E3/uL Final   Platelet Count  Date Value Ref Range Status  04/15/2023 217 150 - 400 K/uL Final         Passed - HGB in normal range and within 360 days    Hemoglobin  Date Value Ref Range Status  04/15/2023 13.5 12.0 - 15.0 g/dL Final  90/84/7976 85.9 11.1 - 15.9 g/dL Final         Passed - HCT in normal range and within 360 days    HCT  Date Value Ref Range Status  04/15/2023 41.2 36.0 - 46.0 % Final   Hematocrit  Date Value Ref Range Status  02/19/2022 41.9 34.0 - 46.6 % Final         Passed - Cr in normal range and within 360 days    Creatinine  Date Value Ref Range Status  04/15/2023 0.91 0.44 - 1.00 mg/dL Final         Passed - AST in normal range and within 360 days    AST  Date Value Ref Range Status  04/15/2023 19 15 - 41 U/L Final         Passed - ALT in normal range and within 360 days    ALT  Date Value Ref Range Status  04/15/2023 17 0 - 44 U/L Final         Passed - Valid encounter within last 12 months    Recent Outpatient Visits           4 months ago CLL (chronic lymphocytic leukemia) (HCC)   [place] [place], Melanie T, NP   6 months ago Allergic disorder, initial encounter   [place] [facility] Melvin Pao, NP   8 months ago CLL (chronic lymphocytic leukemia) (HCC)   [place] [facility] [place], Wedderburn T, NP   10 months ago CLL (chronic lymphocytic leukemia) (HCC)   [place] [facility] [place], Melanie T, NP   10 months ago CLL (chronic lymphocytic leukemia) (HCC)   [facility] [place], Melanie DASEN, NP       Future Appointments             In 2 months Cannady, Jolene T, NP [facility] [facility]

## 2023-06-10 ENCOUNTER — Telehealth: Payer: Self-pay | Admitting: Pharmacy Technician

## 2023-06-10 ENCOUNTER — Other Ambulatory Visit (HOSPITAL_COMMUNITY): Payer: Self-pay

## 2023-06-10 ENCOUNTER — Other Ambulatory Visit: Payer: Self-pay

## 2023-06-10 NOTE — Telephone Encounter (Signed)
 Oral Oncology Patient Advocate Encounter  Was successful in locating patient a $8,000 grant from [facility] to provide copayment coverage for Brukinsa .  This will keep the out of pocket expense at $0.     [employer] ID: [id number]   The billing information is as follows and has been shared with [facility].    RxBin: [id number] PCN: [id number] Member ID: [id number] Group ID: [id number] Dates of Eligibility: 04/02/23 through 03/31/24  Fund:  CLL  Estefana Moellers, CPhT-Adv Oncology Pharmacy Patient Advocate [facility] Direct Number: [phone]  Fax: [phone]

## 2023-06-13 ENCOUNTER — Other Ambulatory Visit: Payer: Self-pay

## 2023-06-13 DIAGNOSIS — Z961 Presence of intraocular lens: Secondary | ICD-10-CM | POA: Diagnosis not present

## 2023-06-15 ENCOUNTER — Ambulatory Visit: Payer: Self-pay

## 2023-06-15 ENCOUNTER — Other Ambulatory Visit: Payer: Self-pay

## 2023-06-15 NOTE — Telephone Encounter (Signed)
 Message from Loleta T sent at 06/15/2023  9:40 AM EST  Summary: medication request   Patient called stated she is still coughing up dark green phlegm. She has runny nose alomg with chest congestion and would like something called in for symptoms. Please f/u with patient         Chief Complaint: green phlegm,constant coughing Symptoms: runny nose, wheezing, mild headache Frequency: 3 weeks  Pertinent Negatives: Patient denies SOB, chest pain, fever Disposition: [] ED /[] Urgent Care (no appt availability in office) / [x] Appointment(In office/virtual)/ []  [facility]/ [] Home Care/ [] Refused Recommended Disposition /[] [facility]/ []  Follow-up with PCP Additional Notes: no appts with PCP this week. Pt failed Z Pack, steroids and Tessalon  .  Reason for Disposition  Cough has been present for > 3 weeks  Answer Assessment - Initial Assessment Questions 1. ONSET: When did the cough begin?      3 weeks 2. SEVERITY: How bad is the cough today?      constant 3. SPUTUM: Describe the color of your sputum (none, dry cough; clear, white, yellow, green)     Green  4. HEMOPTYSIS: Are you coughing up any blood? If so ask: How much? (flecks, streaks, tablespoons, etc.)     Saw red dark with deep coughing 5. DIFFICULTY BREATHING: Are you having difficulty breathing? If Yes, ask: How bad is it? (e.g., mild, moderate, severe)    - MILD: No SOB at rest, mild SOB with walking, speaks normally in sentences, can lie down, no retractions, pulse < 100.    - MODERATE: SOB at rest, SOB with minimal exertion and prefers to sit, cannot lie down flat, speaks in phrases, mild retractions, audible wheezing, pulse 100-120.    - SEVERE: Very SOB at rest, speaks in single words, struggling to breathe, sitting hunched forward, retractions, pulse > 120      none 6. FEVER: Do you have a fever? If Yes, ask: What is your temperature, how was it measured, and when did it start?     None   8. LUNG HISTORY: Do you have any history of lung disease?  (e.g., pulmonary embolus, asthma, emphysema)     none 10. OTHER SYMPTOMS: Do you have any other symptoms? (e.g., runny nose, wheezing, chest pain)       Mild H/A, runny nose,wheezing  Protocols used: Cough - Acute Productive-A-AH

## 2023-06-16 ENCOUNTER — Ambulatory Visit
Admission: RE | Admit: 2023-06-16 | Discharge: 2023-06-16 | Disposition: A | Discharge status: Home / Self Care | Payer: Medicare HMO | Attending: Pediatrics | Admitting: Pediatrics

## 2023-06-16 ENCOUNTER — Ambulatory Visit (INDEPENDENT_AMBULATORY_CARE_PROVIDER_SITE_OTHER): Payer: Medicare HMO | Admitting: Pediatrics

## 2023-06-16 ENCOUNTER — Ambulatory Visit
Admission: RE | Admit: 2023-06-16 | Discharge: 2023-06-16 | Disposition: A | Discharge status: Home / Self Care | Payer: Medicare HMO | Source: Ambulatory Visit | Attending: Pediatrics | Admitting: Pediatrics

## 2023-06-16 ENCOUNTER — Encounter: Payer: Self-pay | Admitting: Pediatrics

## 2023-06-16 VITALS — BP 151/82 | HR 74 | Temp 97.8°F | Ht 66.0 in | Wt 257.2 lb

## 2023-06-16 DIAGNOSIS — R030 Elevated blood-pressure reading, without diagnosis of hypertension: Secondary | ICD-10-CM

## 2023-06-16 DIAGNOSIS — J069 Acute upper respiratory infection, unspecified: Secondary | ICD-10-CM

## 2023-06-16 DIAGNOSIS — Z1330 Encounter for screening examination for mental health and behavioral disorders, unspecified: Secondary | ICD-10-CM

## 2023-06-16 DIAGNOSIS — R058 Other specified cough: Secondary | ICD-10-CM | POA: Diagnosis not present

## 2023-06-16 DIAGNOSIS — R053 Chronic cough: Secondary | ICD-10-CM

## 2023-06-16 DIAGNOSIS — K449 Diaphragmatic hernia without obstruction or gangrene: Secondary | ICD-10-CM | POA: Diagnosis not present

## 2023-06-16 DIAGNOSIS — R062 Wheezing: Secondary | ICD-10-CM | POA: Diagnosis not present

## 2023-06-16 MED ORDER — DOXYCYCLINE HYCLATE 100 MG PO TABS: 100.0000 mg | ORAL_TABLET | Freq: Two times a day (BID) | ORAL | 0 refills | Status: AC

## 2023-06-16 NOTE — Progress Notes (Signed)
 Office Visit  BP (!) 151/82 (BP Location: Left Arm, Patient Position: Sitting, Cuff Size: Large)   Pulse 74   Temp 97.8 F (36.6 C) (Oral)   Ht 5' 6 (1.676 m)   Wt 257 lb 3.2 oz (116.7 kg)   SpO2 97%   BMI 41.51 kg/m    Subjective:    Patient ID: Erika Anderson, female    DOB: 02-29-1951, 73 y.o.   MRN: [id number]  HPI: Erika Anderson is a 73 y.o. female  Chief Complaint  Patient presents with   Cough    Green phlegm, wont go away. Has been a prob since after christmas    Wheezing   Shortness of Breath   Discussed the use of AI scribe software for clinical note transcription with the patient, who gave verbal consent to proceed.  History of Present Illness   The patient, with a history of leukemia, presented with symptoms of a respiratory infection that began around Christmas. She reported a persistent cough with phlegm production and a runny nose, which have remained steady over the past two weeks. The patient denied any history of COPD or regular inhaler use. She also reported wheezing and shortness of breath, particularly noticeable during physical activity. However, she denied any associated fever, nausea, or vomiting.  The patient had previously sought medical attention for these symptoms and was prescribed azithromycin , prednisone , and Tessalon , but the symptoms persisted. She also reported using Mucinex  for symptom relief. The patient denied any ear or throat pain but confirmed ongoing nasal congestion.  The patient also mentioned a potential history of sleep apnea, although a formal sleep study was inconclusive due to the patient's inability to sleep during the test. She also noted a significant illness prior to the COVID-19 pandemic, which required multiple ER visits.  The patient's symptoms have not improved despite the prescribed medications, suggesting a possible bacterial pneumonia. However, recent swabs for flu, COVID, and strep were negative. The patient's  blood pressure was noted to be elevated at the time of the consultation.      Relevant past medical, surgical, family and social history reviewed and updated as indicated. Interim medical history since our last visit reviewed. Allergies and medications reviewed and updated.  ROS per HPI unless specifically indicated above     Objective:    BP (!) 151/82 (BP Location: Left Arm, Patient Position: Sitting, Cuff Size: Large)   Pulse 74   Temp 97.8 F (36.6 C) (Oral)   Ht 5' 6 (1.676 m)   Wt 257 lb 3.2 oz (116.7 kg)   SpO2 97%   BMI 41.51 kg/m   Wt Readings from Last 3 Encounters:  06/16/23 257 lb 3.2 oz (116.7 kg)  05/25/23 254 lb (115.2 kg)  05/18/23 254 lb (115.2 kg)     Physical Exam Constitutional:      Appearance: Normal appearance.  HENT:     Head: Normocephalic and atraumatic.  Eyes:     Pupils: Pupils are equal, round, and reactive to light.  Cardiovascular:     Rate and Rhythm: Normal rate and regular rhythm.     Pulses: Normal pulses.     Heart sounds: Normal heart sounds.  Pulmonary:     Effort: Pulmonary effort is normal. No respiratory distress.     Breath sounds: No decreased breath sounds, wheezing, rhonchi or rales.  Abdominal:     General: Abdomen is flat.     Palpations: Abdomen is soft.  Musculoskeletal:  General: Normal range of motion.     Cervical back: Normal range of motion.  Skin:    General: Skin is warm and dry.     Capillary Refill: Capillary refill takes less than 2 seconds.  Neurological:     General: No focal deficit present.     Mental Status: She is alert. Mental status is at baseline.  Psychiatric:        Mood and Affect: Mood normal.        Behavior: Behavior normal.         06/16/2023    9:58 AM 03/08/2023    9:47 AM 01/13/2023   10:08 AM 09/21/2022    9:11 AM 08/04/2022    8:13 AM  Depression screen PHQ 2/9  Decreased Interest 0 0 1 0 1  Down, Depressed, Hopeless 0 0 0 0 0  PHQ - 2 Score 0 0 1 0 1  Altered sleeping  2 0 3 1 1   Tired, decreased energy 1 0 2 1 1   Change in appetite 0 0 2 1 1   Feeling bad or failure about yourself  0 0 0 0 0  Trouble concentrating 0 0 0 0 0  Moving slowly or fidgety/restless 0 0 1 0 0  Suicidal thoughts 0 0 0 0 0  PHQ-9 Score 3 0 9 3 4   Difficult doing work/chores  Not difficult at all Somewhat difficult Not difficult at all Not difficult at all       06/16/2023    9:58 AM 01/13/2023   10:08 AM 09/21/2022    9:12 AM 08/04/2022    8:13 AM  GAD 7 : Generalized Anxiety Score  Nervous, Anxious, on Edge 0 0 0 0  Control/stop worrying 0 0 0 0  Worry too much - different things 0 0 0 0  Trouble relaxing 1 2 1  0  Restless 0 2 1 0  Easily annoyed or irritable 0 0 0 0  Afraid - awful might happen 0 0 0 0  Total GAD 7 Score 1 4 2  0  Anxiety Difficulty  Somewhat difficult Not difficult at all Not difficult at all       Assessment & Plan:  Assessment & Plan   Upper respiratory tract infection, unspecified type Persistent symptoms of cough, phlegm, malaise and rhinorrhea for two weeks. No improvement with azithromycin , prednisone , and Tessalon  for presumed atypical PNA on 12/30. No history of COPD or regular inhaler use. No fever, nausea, or vomiting. Flu, COVID, and strep tests negative. H/o CLL in remission. Suspect may need broader CAP coverage. Will switch to doxy, get labs and xray as below. Return next week. -Order chest x-ray to rule out pneumonia. -Start Doxycycline  for 5 days for possible community-acquired pneumonia. -Recommend Mucinex  DM and honey for symptomatic relief. -     Veritor Flu A/B Waived -     Rapid Strep Screen ([facility] ONLY) -     Novel Coronavirus, NAA ([facility]) -     Doxycycline  Hyclate; Take 1 tablet (100 mg total) by mouth 2 (two) times daily for 5 days.  Dispense: 10 tablet; Refill: 0 -     CBC with Differential/Platelet -     DG Chest 2 View; Future -     Culture, Group A Strep  Persistent cough Tachycardic today compared to prior  readings suspect from illness. Will r/o PE given persistent cough and likely hypercoagulable state w h/o CLL.  -     D-dimer, quantitative  Elevated  BP Asymptomatic. F/u next week.  Encounter for behavioral health screening As part of their intake evaluation, the patient was screened for depression, anxiety.  PHQ9 SCORE 3, GAD7 SCORE 1. Screening results negative for tested conditions. CTM.  Follow up plan: Return in about 5 days (around 06/21/2023) for CAP.  Alaila Pillard SHAUNNA NETT, MD

## 2023-06-16 NOTE — Patient Instructions (Addendum)
 Please take doxycycline  100mg  twice daily for 5 days. And continue other supportive measures with honey and mucinex  DM as discussed below.  For the CXR: [facility] [address] [place],  [place]  [address]  Most cold symptoms last up to 2 weeks, but cough can sometimes linger up to 4 weeks.  However if your symtpoms get WORSE - like you develop fevers or get more shortness of breath, then call your clinic as you may need to be evaluated.   Aches and Pains Acetaminophen  (Tylenol ): 1000mg  (extra strength tablets are 500mg , so take 2) every 8 hours if needed  Ibuprofen  (Advil /Motrin ) 400-800mg  (comes in 200mg  pills OTC, so 2-4 pills) every 8 hours.   Sore Throat:  See Aches and Pains meds above, also Sore throat sprays and lozenges may also help.   Cough:  Honey 2 TBS every 4-6 hours if needed.  Robitussin DM syrup or generic equivalent which has (guaifenesin  = an expectorant to help you get stuff up + dextromethorphan (DM) = cough supressant). You can also get this in tablet formula (like Mucinex  DM or generic equivalent).  If you have asthma or are wheezing and have a tight chest, then albuterol  inhaler (Ventolin , ProAir ) may be helpful - you need a prescription for this.   Congestion:  oxymetazoline (Afrin) nasal stray: 2 sprays each nostril every 12 hours. Don't use more than 3 days in a row to avoid building a tolerance to it.  Sinus rinse (neti pot) high volume sinus rinse can help open up your sinuses and be helpful, especially if you're having sinus pressure and headaches.   Other:  Umcka (pelargonium sidoides extract) can to shorten cold symptoms (can be hard to find, but [employer] carries it: brand name Umcka ColdCare from [employer]). Works best if you start taking at earliest signs of cold symptoms.  Andrographis paniculata is another herbal remedy with less evidence, but may reduce common cold symptoms in adults.  zinc acetate lozenges >= 80 mg/day  reduces duration but not severity of cold symptoms in adults, but it is associated with bad taste and nausea Heated humidified air may reduce cold symptoms, so try using a humidifier - especially in your bedroom at night.  Stay hydrated! Aim to drink at least 2 liters of water daily.   What doesn't work (but lots of folks think might) Vitamin C : bummer right?! But there's no evidence that high dose vitamin C  will help cold symptoms.

## 2023-06-17 LAB — CBC WITH DIFFERENTIAL/PLATELET
Basophils Absolute: 0.1 10*3/uL (ref 0.0–0.2)
Basos: 1 %
EOS (ABSOLUTE): 0.1 10*3/uL (ref 0.0–0.4)
Eos: 2 %
Hematocrit: 39.6 % (ref 34.0–46.6)
Hemoglobin: 13 g/dL (ref 11.1–15.9)
Immature Grans (Abs): 0 10*3/uL (ref 0.0–0.1)
Immature Granulocytes: 0 %
Lymphocytes Absolute: 3.1 10*3/uL (ref 0.7–3.1)
Lymphs: 38 %
MCH: 30.4 pg (ref 26.6–33.0)
MCHC: 32.8 g/dL (ref 31.5–35.7)
MCV: 93 fL (ref 79–97)
Monocytes Absolute: 0.8 10*3/uL (ref 0.1–0.9)
Monocytes: 9 %
Neutrophils Absolute: 4 10*3/uL (ref 1.4–7.0)
Neutrophils: 50 %
Platelets: 196 10*3/uL (ref 150–450)
RBC: 4.28 x10E6/uL (ref 3.77–5.28)
RDW: 12.4 % (ref 11.7–15.4)
WBC: 8.1 10*3/uL (ref 3.4–10.8)

## 2023-06-17 LAB — D-DIMER, QUANTITATIVE: D-DIMER: 0.29 mg{FEU}/L (ref 0.00–0.49)

## 2023-06-17 LAB — NOVEL CORONAVIRUS, NAA: SARS-CoV-2, NAA: NOT DETECTED

## 2023-06-19 LAB — VERITOR FLU A/B WAIVED
Influenza A: NEGATIVE
Influenza B: NEGATIVE

## 2023-06-19 LAB — CULTURE, GROUP A STREP: Strep A Culture: NEGATIVE

## 2023-06-19 LAB — RAPID STREP SCREEN (MED CTR MEBANE ONLY): Strep Gp A Ag, IA W/Reflex: NEGATIVE

## 2023-06-21 ENCOUNTER — Ambulatory Visit (INDEPENDENT_AMBULATORY_CARE_PROVIDER_SITE_OTHER): Payer: Medicare HMO | Admitting: Pediatrics

## 2023-06-21 ENCOUNTER — Encounter: Payer: Self-pay | Admitting: Pediatrics

## 2023-06-21 VITALS — BP 123/68 | HR 64 | Temp 97.9°F | Resp 14 | Wt 259.6 lb

## 2023-06-21 DIAGNOSIS — J069 Acute upper respiratory infection, unspecified: Secondary | ICD-10-CM | POA: Diagnosis not present

## 2023-06-21 DIAGNOSIS — R030 Elevated blood-pressure reading, without diagnosis of hypertension: Secondary | ICD-10-CM

## 2023-06-21 DIAGNOSIS — Z1330 Encounter for screening examination for mental health and behavioral disorders, unspecified: Secondary | ICD-10-CM | POA: Diagnosis not present

## 2023-06-21 NOTE — Progress Notes (Signed)
 Office Visit  BP 123/68 (BP Location: Left Arm, Patient Position: Sitting, Cuff Size: Large)   Pulse 64   Temp 97.9 F (36.6 C) (Oral)   Resp 14   Wt 259 lb 9.6 oz (117.8 kg)   SpO2 98%   BMI 41.90 kg/m    Subjective:    Patient ID: Erika Anderson, female    DOB: 01/18/1951, 73 y.o.   MRN: [id number]  HPI: Erika Anderson is a 73 y.o. female  Chief Complaint  Patient presents with   URI    Improving but not not 100%. Minor coughing and some phelgm and sinus pressure congestion.     Discussed the use of AI scribe software for clinical note transcription with the patient, who gave verbal consent to proceed.  History of Present Illness   The patient presents with persistent cough productive of phlegm, which has been the only remaining symptom after a recent illness. She reports that she has been actively expectorating the phlegm rather than swallowing it. The patient has been taking Mucinex , which she reports finished recently, and she expresses interest in obtaining more. She denies any other health concerns at this time. The patient's blood pressure has improved, and recent tests, including an x-ray, have returned negative results. The patient's overall condition appears to have improved significantly since the onset of the illness. There is a mention of a potential fibrosis evaluation, but the patient seems unaware of this.      Relevant past medical, surgical, family and social history reviewed and updated as indicated. Interim medical history since our last visit reviewed. Allergies and medications reviewed and updated.  ROS per HPI unless specifically indicated above     Objective:    BP 123/68 (BP Location: Left Arm, Patient Position: Sitting, Cuff Size: Large)   Pulse 64   Temp 97.9 F (36.6 C) (Oral)   Resp 14   Wt 259 lb 9.6 oz (117.8 kg)   SpO2 98%   BMI 41.90 kg/m   Wt Readings from Last 3 Encounters:  06/21/23 259 lb 9.6 oz (117.8 kg)  06/16/23 257  lb 3.2 oz (116.7 kg)  05/25/23 254 lb (115.2 kg)     Physical Exam Constitutional:      Appearance: Normal appearance.  HENT:     Head: Normocephalic and atraumatic.  Eyes:     Pupils: Pupils are equal, round, and reactive to light.  Cardiovascular:     Rate and Rhythm: Normal rate and regular rhythm.     Pulses: Normal pulses.     Heart sounds: Normal heart sounds.  Pulmonary:     Effort: Pulmonary effort is normal.     Breath sounds: Normal breath sounds.  Musculoskeletal:        General: Normal range of motion.     Cervical back: Normal range of motion.  Skin:    General: Skin is warm and dry.     Capillary Refill: Capillary refill takes less than 2 seconds.  Neurological:     General: No focal deficit present.     Mental Status: She is alert. Mental status is at baseline.  Psychiatric:        Mood and Affect: Mood normal.        Behavior: Behavior normal.        06/21/2023    1:30 PM 06/16/2023    9:58 AM 03/08/2023    9:47 AM 01/13/2023   10:08 AM 09/21/2022    9:11 AM  Depression  screen PHQ 2/9  Decreased Interest 0 0 0 1 0  Down, Depressed, Hopeless 0 0 0 0 0  PHQ - 2 Score 0 0 0 1 0  Altered sleeping 1 2 0 3 1  Tired, decreased energy 0 1 0 2 1  Change in appetite 0 0 0 2 1  Feeling bad or failure about yourself  0 0 0 0 0  Trouble concentrating 0 0 0 0 0  Moving slowly or fidgety/restless 0 0 0 1 0  Suicidal thoughts 0 0 0 0 0  PHQ-9 Score 1 3 0 9 3  Difficult doing work/chores Not difficult at all  Not difficult at all Somewhat difficult Not difficult at all       06/21/2023    1:30 PM 06/16/2023    9:58 AM 01/13/2023   10:08 AM 09/21/2022    9:12 AM  GAD 7 : Generalized Anxiety Score  Nervous, Anxious, on Edge 0 0 0 0  Control/stop worrying 0 0 0 0  Worry too much - different things 0 0 0 0  Trouble relaxing 0 1 2 1   Restless 0 0 2 1  Easily annoyed or irritable 0 0 0 0  Afraid - awful might happen 0 0 0 0  Total GAD 7 Score 0 1 4 2   Anxiety  Difficulty Not difficult at all  Somewhat difficult Not difficult at all      Assessment & Plan:  Assessment & Plan   Upper respiratory tract infection, unspecified type Recent CAP treatment with doxy, here for follow up after treatment. Improvement noted with residual productive cough. No signs of infection on recent imaging. Very well appearing, continue OTC care for ongoing cough as below. -Continue Mucinex  as needed for symptomatic relief.  Elevated blood pressure reading Noted last visit, now resolved. Continue to monitor.  Encounter for behavioral health screening As part of their intake evaluation, the patient was screened for depression, anxiety.  PHQ9 SCORE 1, GAD7 SCORE 0. Screening results negative for tested conditions. Continue to monitor.   Follow up plan: Return if symptoms worsen or fail to improve.  Livier Hendel SHAUNNA NETT, MD

## 2023-06-30 ENCOUNTER — Other Ambulatory Visit: Payer: Self-pay | Admitting: Nurse Practitioner

## 2023-06-30 NOTE — Telephone Encounter (Signed)
Requested Prescriptions  Pending Prescriptions Disp Refills   citalopram (CELEXA) 20 MG tablet [Pharmacy Med Name: Citalopram Hydrobromide 20 MG Oral Tablet] 90 tablet 0    Sig: Take 1 tablet by mouth once daily     Psychiatry:  Antidepressants - SSRI Passed - 06/30/2023 11:18 AM      Passed - Valid encounter within last 6 months    Recent Outpatient Visits           1 week ago Upper respiratory tract infection, unspecified type   [facility] Jackolyn Confer, MD   2 weeks ago Upper respiratory tract infection, unspecified type   [facility] Jackolyn Confer, MD   5 months ago CLL (chronic lymphocytic leukemia) (HCC)   [facility], Corrie Dandy T, NP   7 months ago Allergic disorder, initial encounter   [facility] Larae Grooms, NP   9 months ago CLL (chronic lymphocytic leukemia) (HCC)   [facility] [place], Dorie Rank, NP       Future Appointments             In 1 month Cannady, Dorie Rank, NP [facility]

## 2023-07-05 ENCOUNTER — Other Ambulatory Visit (HOSPITAL_COMMUNITY): Payer: Self-pay

## 2023-07-05 NOTE — Progress Notes (Signed)
Specialty Pharmacy Refill Coordination Note  Erika Anderson is a 73 y.o. female contacted today regarding refills of specialty medication(s) Zanubrutinib [name])   Patient requested Delivery   Delivery date: 07/15/23   Verified address: [address] [place], [place] [address]   Medication will be filled on 07/14/23.

## 2023-07-05 NOTE — Progress Notes (Signed)
Specialty Pharmacy Ongoing Clinical Assessment Note  Erika Anderson is a 73 y.o. female who is being followed by the specialty pharmacy service for RxSp Oncology   Patient's specialty medication(s) reviewed today: Zanubrutinib (Brukinsa)   Missed doses in the last 4 weeks: 0   Patient/Caregiver did not have any additional questions or concerns.   Therapeutic benefit summary: Patient is achieving benefit   Adverse events/side effects summary: No adverse events/side effects   Patient's therapy is appropriate to: Continue    Goals Addressed             This Visit's Progress    Achieve or maintain remission       Patient is on track. Patient will maintain adherence         Follow up:  6 months  Servando Snare Specialty Pharmacist

## 2023-07-18 NOTE — Assessment & Plan Note (Signed)
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 Rapid lymphocyte increment, 50% over 2 months period She is on Elqiuis, will watch for possible increase risk of bleeding.  Labs are reviewed and discussed with patient. Continue Zanubrutinib '160mg'$  BID, clinically she tolerates well.

## 2023-07-19 ENCOUNTER — Other Ambulatory Visit: Payer: Self-pay

## 2023-07-19 ENCOUNTER — Encounter: Payer: Self-pay | Admitting: Oncology

## 2023-07-19 ENCOUNTER — Inpatient Hospital Stay (HOSPITAL_BASED_OUTPATIENT_CLINIC_OR_DEPARTMENT_OTHER): Payer: Medicare HMO | Admitting: Oncology

## 2023-07-19 ENCOUNTER — Inpatient Hospital Stay: Payer: Medicare HMO | Attending: Oncology

## 2023-07-19 ENCOUNTER — Other Ambulatory Visit (HOSPITAL_COMMUNITY): Payer: Self-pay

## 2023-07-19 VITALS — BP 142/59 | HR 59 | Temp 96.3°F | Resp 18 | Wt 261.1 lb

## 2023-07-19 DIAGNOSIS — Z8249 Family history of ischemic heart disease and other diseases of the circulatory system: Secondary | ICD-10-CM | POA: Insufficient documentation

## 2023-07-19 DIAGNOSIS — M25569 Pain in unspecified knee: Secondary | ICD-10-CM | POA: Diagnosis not present

## 2023-07-19 DIAGNOSIS — Z79899 Other long term (current) drug therapy: Secondary | ICD-10-CM | POA: Insufficient documentation

## 2023-07-19 DIAGNOSIS — Z818 Family history of other mental and behavioral disorders: Secondary | ICD-10-CM | POA: Insufficient documentation

## 2023-07-19 DIAGNOSIS — E669 Obesity, unspecified: Secondary | ICD-10-CM | POA: Insufficient documentation

## 2023-07-19 DIAGNOSIS — G479 Sleep disorder, unspecified: Secondary | ICD-10-CM | POA: Insufficient documentation

## 2023-07-19 DIAGNOSIS — Z5111 Encounter for antineoplastic chemotherapy: Secondary | ICD-10-CM

## 2023-07-19 DIAGNOSIS — I4891 Unspecified atrial fibrillation: Secondary | ICD-10-CM | POA: Diagnosis not present

## 2023-07-19 DIAGNOSIS — Z8261 Family history of arthritis: Secondary | ICD-10-CM | POA: Insufficient documentation

## 2023-07-19 DIAGNOSIS — G2581 Restless legs syndrome: Secondary | ICD-10-CM | POA: Diagnosis not present

## 2023-07-19 DIAGNOSIS — Z801 Family history of malignant neoplasm of trachea, bronchus and lung: Secondary | ICD-10-CM | POA: Insufficient documentation

## 2023-07-19 DIAGNOSIS — E785 Hyperlipidemia, unspecified: Secondary | ICD-10-CM | POA: Diagnosis not present

## 2023-07-19 DIAGNOSIS — C9110 Chronic lymphocytic leukemia of B-cell type not having achieved remission: Secondary | ICD-10-CM | POA: Diagnosis not present

## 2023-07-19 DIAGNOSIS — Z90710 Acquired absence of both cervix and uterus: Secondary | ICD-10-CM | POA: Diagnosis not present

## 2023-07-19 DIAGNOSIS — Z823 Family history of stroke: Secondary | ICD-10-CM | POA: Insufficient documentation

## 2023-07-19 DIAGNOSIS — R61 Generalized hyperhidrosis: Secondary | ICD-10-CM | POA: Diagnosis not present

## 2023-07-19 DIAGNOSIS — Z8041 Family history of malignant neoplasm of ovary: Secondary | ICD-10-CM | POA: Insufficient documentation

## 2023-07-19 DIAGNOSIS — Z7901 Long term (current) use of anticoagulants: Secondary | ICD-10-CM | POA: Insufficient documentation

## 2023-07-19 LAB — CMP (CANCER CENTER ONLY)
ALT: 19 U/L (ref 0–44)
AST: 21 U/L (ref 15–41)
Albumin: 4 g/dL (ref 3.5–5.0)
Alkaline Phosphatase: 64 U/L (ref 38–126)
Anion gap: 7 (ref 5–15)
BUN: 17 mg/dL (ref 8–23)
CO2: 28 mmol/L (ref 22–32)
Calcium: 8.9 mg/dL (ref 8.9–10.3)
Chloride: 104 mmol/L (ref 98–111)
Creatinine: 1.01 mg/dL — ABNORMAL HIGH (ref 0.44–1.00)
GFR, Estimated: 60 mL/min — ABNORMAL LOW (ref >60)
Glucose, Bld: 94 mg/dL (ref 70–99)
Potassium: 3.7 mmol/L (ref 3.5–5.1)
Sodium: 139 mmol/L (ref 135–145)
Total Bilirubin: 0.7 mg/dL (ref 0.0–1.2)
Total Protein: 6.9 g/dL (ref 6.5–8.1)

## 2023-07-19 LAB — CBC WITH DIFFERENTIAL (CANCER CENTER ONLY)
Abs Immature Granulocytes: 0.02 10*3/uL (ref 0.00–0.07)
Basophils Absolute: 0 10*3/uL (ref 0.0–0.1)
Basophils Relative: 1 %
Eosinophils Absolute: 0.1 10*3/uL (ref 0.0–0.5)
Eosinophils Relative: 1 %
HCT: 39.1 % (ref 36.0–46.0)
Hemoglobin: 12.7 g/dL (ref 12.0–15.0)
Immature Granulocytes: 0 %
Lymphocytes Relative: 28 %
Lymphs Abs: 2 10*3/uL (ref 0.7–4.0)
MCH: 29.8 pg (ref 26.0–34.0)
MCHC: 32.5 g/dL (ref 30.0–36.0)
MCV: 91.8 fL (ref 80.0–100.0)
Monocytes Absolute: 0.6 10*3/uL (ref 0.1–1.0)
Monocytes Relative: 9 %
Neutro Abs: 4.2 10*3/uL (ref 1.7–7.7)
Neutrophils Relative %: 61 %
Platelet Count: 197 10*3/uL (ref 150–400)
RBC: 4.26 MIL/uL (ref 3.87–5.11)
RDW: 13.1 % (ref 11.5–15.5)
WBC Count: 7 10*3/uL (ref 4.0–10.5)
nRBC: 0 % (ref 0.0–0.2)

## 2023-07-19 LAB — IRON AND TIBC
Iron: 92 ug/dL (ref 28–170)
Saturation Ratios: 28 % (ref 10.4–31.8)
TIBC: 335 ug/dL (ref 250–450)
UIBC: 243 ug/dL

## 2023-07-19 LAB — LACTATE DEHYDROGENASE: LDH: 155 U/L (ref 98–192)

## 2023-07-19 LAB — FERRITIN: Ferritin: 61 ng/mL (ref 11–307)

## 2023-07-19 MED ORDER — BRUKINSA 80 MG PO CAPS
160.0000 mg | ORAL_CAPSULE | Freq: Two times a day (BID) | ORAL | 3 refills | Status: DC
  Filled 2023-07-19 – 2023-08-05 (×2): qty 120, 30d supply, fill #0
  Filled 2023-09-06 (×2): qty 120, 30d supply, fill #1
  Filled 2023-10-06: qty 120, 30d supply, fill #2
  Filled 2023-11-07: qty 120, 30d supply, fill #3

## 2023-07-19 MED ORDER — IRON-VITAMIN C 65-125 MG PO TABS: 1.0000 | ORAL_TABLET | ORAL | 1 refills | Status: AC

## 2023-07-19 NOTE — Assessment & Plan Note (Signed)
Continue Zanubrutinib 160mg  BID

## 2023-07-19 NOTE — Assessment & Plan Note (Addendum)
Lab Results  Component Value Date   HGB 12.7 07/19/2023   TIBC 335 07/19/2023   IRONPCTSAT 28 07/19/2023   FERRITIN 61 07/19/2023    I recommend patient to take iron supplementation Vitron C every other day to further increase ferritin, goal is > 75 Continue to monitor.

## 2023-07-19 NOTE — Addendum Note (Signed)
Addended by: [name] on: 07/19/2023 07:13 PM   Modules accepted: Orders

## 2023-07-19 NOTE — Progress Notes (Signed)
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 Rapid lymphocyte increment, 50% over 2 months period She is on Elqiuis, will watch for possible increase risk of bleeding.  Labs are reviewed and discussed with patient. Continue Zanubrutinib 160mg  BID, clinically she tolerates well.    Encounter for antineoplastic chemotherapy Continue Zanubrutinib 160mg  BID  RLS (restless legs syndrome) Lab Results  Component Value Date   HGB 12.7 07/19/2023   TIBC 335 07/19/2023   IRONPCTSAT 28 07/19/2023   FERRITIN 61 07/19/2023    I recommend patient to take iron supplementation Vitron C every other day to further increase ferritin, goal is > 75 Continue to monitor.    Follow up 3 months  [name], MD, PhD [facility] Hematology Oncology 07/19/2023       HISTORY OF PRESENTING ILLNESS:  Erika Anderson [facility] 73 y.o. female presents to follow up with CLL. She was found to have abnormal CBC on 03/01/22, total white count of 15, predominantly lymphocytosis. This is chronic since Feb 2023. Peripheral blood smear + smudge cells.  + Restless leg syndrome, sleep disturbance for 1 year.  + Family history of ovarian cancer -mother.    Oncology History  CLL (chronic lymphocytic leukemia) (HCC)  03/02/2022 Initial Diagnosis   CLL (chronic lymphocytic leukemia)  -03/15/22 Flowcytometry showed chronic lymphocytic leukemia, B cell, CD38  expression indeterminate for prognosis (23%) and CD20 positive.  45% nuclei positive for TP53 gene deletion.    03/26/2022 Imaging   US abdomen 1. No sonographic etiology for night sweats identified. 2. Spleen is at the upper limits of normal in size    04/01/2022 Cancer Staging   Staging form: Chronic Lymphocytic Leukemia / Small Lymphocytic Lymphoma, AJCC 8th Edition - Clinical: Modified Rai Stage 0 (Modified Rai risk: Low, Lymphocytosis: Present, Adenopathy: Absent, Organomegaly: Absent, Anemia: Absent, Thrombocytopenia: Absent) - Signed by [name], MD on 04/01/2022 Stage prefix: Initial diagnosis Absolute lymphocyte count (ALC) (cells/uL): 18   04/19/2022 -  Chemotherapy   Started on Zanubrutinib 160mg  BID      She reports tolerating Zanubrutinib 160mg  BID well. Night sweat has resolved  Appetite is fair. + Knee pain, there is plan for physical therapy and a possible future knee replacement.   MEDICAL HISTORY:  Past Medical History:  Diagnosis Date   Atrial fibrillation (HCC)    Chronic lymphocytic leukemia (HCC)    Grade II diastolic dysfunction    Hyperlipidemia    Leukemia (HCC)    Mild mitral regurgitation by prior echocardiogram    Obesity (BMI 30-39.9)    Restless legs syndrome (RLS) 12/28/2014   RLS (restless legs syndrome)     SURGICAL HISTORY: Past Surgical History:  Procedure Laterality Date   ATRIAL FIBRILLATION ABLATION N/A 03/22/2022   Procedure: ATRIAL FIBRILLATION ABLATION;  Surgeon: Lanier Prude, MD;  Location: [facility] INVASIVE CV LAB;  Service: Cardiovascular;  Laterality: N/A;   CATARACT EXTRACTION W/PHACO Left 05/18/2023   Procedure: CATARACT EXTRACTION PHACO AND INTRAOCULAR LENS PLACEMENT (IOC) LEFT;  Surgeon: Lockie Mola, MD;  Location: [facility]  [facility];  Service: Ophthalmology;  Laterality: Left;  6.95 0:44.8   CATARACT EXTRACTION W/PHACO Right 05/25/2023   Procedure: CATARACT EXTRACTION PHACO AND INTRAOCULAR LENS PLACEMENT (IOC)  RIGHT 5.35 00:35.4;  Surgeon: Lockie Mola, MD;  Location: [facility];  Service: Ophthalmology;  Laterality: Right;   COLONOSCOPY WITH PROPOFOL N/A 08/24/2021   Procedure: COLONOSCOPY WITH PROPOFOL;  Surgeon: Wyline Mood, MD;  Location[facility]COPY;  Service: Gastroenterology;  Laterality: N/A;   HEMORROIDECTOMY     TOTAL ABDOMINAL HYSTERECTOMY     ovaries remain    SOCIAL HISTORY: Social History   Socioeconomic History   Marital status: Married    Spouse name: Not on file   Number of children: Not on file   Years of education: 12   Highest education level: High school graduate  Occupational History   Occupation: retired  Tobacco Use   Smoking status: Never   Smokeless tobacco: Never   Tobacco comments:    Never smoke 04/19/22  Vaping Use   Vaping status: Never Used  Substance and Sexual Activity   Alcohol use: No    Alcohol/week: 0.0 standard drinks of alcohol   Drug use: No   Sexual activity: Not Currently  Other Topics Concern   Not on file  Social History Narrative   Helps take care of grandchildren    Social Drivers of Health   Financial Resource Strain: Low Risk  (03/08/2023)   Overall Financial Resource Strain (CARDIA)    Difficulty of Paying Living Expenses: Not hard at all  Food Insecurity: No Food Insecurity (03/08/2023)   Hunger Vital Sign    Worried About Running Out of Food in the Last Year: Never true    Ran Out of Food in the Last Year: Never true  Transportation Needs: No Transportation Needs (03/08/2023)   PRAPARE - Administrator, Civil Service (Medical): No    Lack of Transportation (Non-Medical): No  Physical Activity: Sufficiently Active (03/08/2023)   Exercise Vital Sign    Days of Exercise per Week: 7 days    Minutes of Exercise per Session: 30 min  Stress: No Stress Concern Present (03/08/2023)   [employer] of Occupational Health - Occupational Stress Questionnaire    Feeling of Stress : Not at all  Social  Connections: Moderately Integrated (03/08/2023)   Social Connection and Isolation Panel [NHANES]    Frequency of Communication with Friends and Family: More than three times a week    Frequency of Social Gatherings with Friends and Family: Twice a week    Attends Religious Services: More than 4 times per year    Active Member of [employer] or Organizations: No    Attends Banker Meetings: Never    Marital Status: Married  Catering manager Violence: Not At Risk (03/08/2023)   Humiliation, Afraid, Rape, and Kick questionnaire    Fear of Current or Ex-Partner: No    Emotionally Abused: No    Physically Abused: No    Sexually Abused: No    FAMILY HISTORY: Family History  Problem Relation Age of Onset   Cancer Mother        ovarian   Dementia Father    Arthritis Sister    Heart disease Brother        MI   Stroke Paternal Grandmother    Cancer Paternal Grandfather        lung   Breast cancer Neg Hx     ALLERGIES:  has no known allergies.  MEDICATIONS:  Current Outpatient Medications  Medication Sig Dispense Refill   betamethasone dipropionate 0.05 % lotion Apply topically daily.     citalopram (CELEXA) 20 MG tablet Take 1 tablet by mouth once daily 90 tablet 0   ELIQUIS 5 MG TABS tablet Take 1 tablet by mouth twice daily 180 tablet 0   gabapentin (NEURONTIN) 300 MG capsule Take 2 capsules (600 mg total) by mouth at bedtime. (Patient taking differently: Take 300 mg by mouth at bedtime as needed (pain).) 180 capsule 4   ketoconazole (NIZORAL) 2 % shampoo Apply topically 3 (three) times a week.     metoprolol tartrate (LOPRESSOR) 25 MG tablet Take 1 tablet by mouth twice daily 180 tablet 4   Multiple Vitamins-Minerals (MULTIVITAMIN WITH MINERALS) tablet Take 1 tablet by mouth daily.     pantoprazole (PROTONIX) 40 MG tablet Take 1 tablet (40 mg total) by mouth daily. 90 tablet 3   rOPINIRole (REQUIP) 1 MG tablet Take 1 tablet by mouth 4 times daily 360 tablet 4   rosuvastatin  (CRESTOR) 20 MG tablet Take 1 tablet (20 mg total) by mouth daily. 90 tablet 4   traZODone (DESYREL) 50 MG tablet TAKE 1/2 TO 1 (ONE-HALF TO ONE) TABLET BY MOUTH AT BEDTIME AS NEEDED FOR SLEEP 90 tablet 4   zanubrutinib (BRUKINSA) 80 MG capsule Take 2 capsules (160 mg total) by mouth 2 (two) times daily. 120 capsule 3   No current facility-administered medications for this visit.    Review of Systems  Constitutional:  Negative for appetite change, chills, fatigue and fever.  HENT:   Negative for hearing loss and voice change.   Eyes:  Negative for eye problems.  Respiratory:  Negative for chest tightness and cough.   Cardiovascular:  Negative for chest pain.  Gastrointestinal:  Negative for abdominal distention, abdominal pain and blood in stool.  Endocrine: Negative for hot flashes.       Night sweat  Genitourinary:  Negative for difficulty urinating and frequency.   Musculoskeletal:  Negative for arthralgias.  Skin:  Negative for itching and rash.  Neurological:  Negative for extremity weakness.  Hematological:  Negative for adenopathy.  Psychiatric/Behavioral:  Positive for sleep disturbance. Negative for confusion.      PHYSICAL EXAMINATION: ECOG PERFORMANCE STATUS: 1 - Symptomatic but completely ambulatory  Vitals:   07/19/23 1025  BP: (!) 142/59  Pulse: (!) 59  Resp: 18  Temp: (!) 96.3 F (35.7 C)  SpO2: 98%   Filed Weights   07/19/23 1025  Weight: 261 lb 1.6 oz (118.4 kg)    Physical Exam HENT:     Head: Normocephalic.     Nose: Nose normal.     Mouth/Throat:     Pharynx: No oropharyngeal exudate.  Eyes:     General: No scleral icterus.    Pupils: Pupils are equal, round, and reactive to light.  Cardiovascular:     Rate and Rhythm: Normal rate.  Pulmonary:     Effort: Pulmonary effort is normal. No respiratory distress.     Breath sounds: No wheezing.  Abdominal:     General: There is no distension.     Palpations: Abdomen is soft.  Musculoskeletal:         General: Normal range of motion.     Cervical back: Normal range of motion.  Skin:    General: Skin is warm and dry.     Findings: No erythema.  Neurological:     Mental Status: She is alert and oriented to  person, place, and time. Mental status is at baseline.     Cranial Nerves: No cranial nerve deficit.     Motor: No abnormal muscle tone.  Psychiatric:        Mood and Affect: Affect normal.    LABORATORY DATA:  I have reviewed the data as listed    Latest Ref Rng & Units 07/19/2023   10:02 AM 06/16/2023   10:24 AM 04/15/2023    9:44 AM  CBC  WBC 4.0 - 10.5 K/uL 7.0  8.1  9.7   Hemoglobin 12.0 - 15.0 g/dL 40.9  81.1  91.4   Hematocrit 36.0 - 46.0 % 39.1  39.6  41.2   Platelets 150 - 400 K/uL 197  196  217       Latest Ref Rng & Units 07/19/2023   10:02 AM 04/15/2023    9:44 AM 01/13/2023   10:41 AM  CMP  Glucose 70 - 99 mg/dL 94  99  782   BUN 8 - 23 mg/dL 17  23  14    Creatinine 0.44 - 1.00 mg/dL 9.56  2.13  0.86   Sodium 135 - 145 mmol/L 139  138  141   Potassium 3.5 - 5.1 mmol/L 3.7  4.9  4.1   Chloride 98 - 111 mmol/L 104  104  103   CO2 22 - 32 mmol/L 28  29  25    Calcium 8.9 - 10.3 mg/dL 8.9  9.2  9.4   Total Protein 6.5 - 8.1 g/dL 6.9  7.0  6.3   Total Bilirubin 0.0 - 1.2 mg/dL 0.7  0.8  0.4   Alkaline Phos 38 - 126 U/L 64  67  85   AST 15 - 41 U/L 21  19  19    ALT 0 - 44 U/L 19  17  16     Lab Results  Component Value Date   LDH 155 07/19/2023   LDH 145 04/15/2023   LDH 192 03/15/2022    RADIOGRAPHIC STUDIES: I have personally reviewed the radiological images as listed and agreed with the findings in the report. No results found.

## 2023-08-03 ENCOUNTER — Other Ambulatory Visit: Payer: Self-pay | Admitting: Nurse Practitioner

## 2023-08-04 NOTE — Telephone Encounter (Signed)
 Labs in date  Requested Prescriptions  Pending Prescriptions Disp Refills   gabapentin (NEURONTIN) 300 MG capsule [Pharmacy Med Name: Gabapentin 300 MG Oral Capsule] 180 capsule 0    Sig: TAKE 2 CAPSULES BY MOUTH AT BEDTIME     Neurology: Anticonvulsants - gabapentin Failed - 08/04/2023  1:37 PM      Failed - Cr in normal range and within 360 days    Creatinine  Date Value Ref Range Status  07/19/2023 1.01 (H) 0.44 - 1.00 mg/dL Final         Passed - Completed PHQ-2 or PHQ-9 in the last 360 days      Passed - Valid encounter within last 12 months    Recent Outpatient Visits           1 month ago Upper respiratory tract infection, unspecified type   [place] Jackolyn Confer, MD   1 month ago Upper respiratory tract infection, unspecified type   [place] [facility] Jackolyn Confer, MD   6 months ago CLL (chronic lymphocytic leukemia) (HCC)   [place] [facility], Corrie Dandy T, NP   8 months ago Allergic disorder, initial encounter   [place] [facility] Larae Grooms, NP   10 months ago CLL (chronic lymphocytic leukemia) (HCC)   [facility], Dorie Rank, NP       Future Appointments             In 5 days Cannady, Dorie Rank, NP  [facility]

## 2023-08-05 ENCOUNTER — Other Ambulatory Visit: Payer: Self-pay

## 2023-08-05 ENCOUNTER — Other Ambulatory Visit: Payer: Self-pay | Admitting: Pharmacy Technician

## 2023-08-05 NOTE — Progress Notes (Signed)
 Specialty Pharmacy Refill Coordination Note  Erika Anderson is a 73 y.o. female contacted today regarding refills of specialty medication(s) Zanubrutinib [name])   Patient requested Delivery   Delivery date: 08/11/23   Verified address: [address] [place], [place]   Medication will be filled on 08/10/23.

## 2023-08-09 ENCOUNTER — Encounter: Payer: Self-pay | Admitting: Nurse Practitioner

## 2023-08-09 ENCOUNTER — Ambulatory Visit (INDEPENDENT_AMBULATORY_CARE_PROVIDER_SITE_OTHER): Payer: Medicare HMO | Admitting: Vascular Surgery

## 2023-08-09 ENCOUNTER — Encounter: Payer: Medicare HMO | Admitting: Nurse Practitioner

## 2023-08-09 ENCOUNTER — Encounter (INDEPENDENT_AMBULATORY_CARE_PROVIDER_SITE_OTHER): Payer: Self-pay | Admitting: Vascular Surgery

## 2023-08-09 VITALS — BP 161/84 | HR 60 | Resp 18 | Ht 66.0 in | Wt 264.4 lb

## 2023-08-09 DIAGNOSIS — E785 Hyperlipidemia, unspecified: Secondary | ICD-10-CM

## 2023-08-09 DIAGNOSIS — I480 Paroxysmal atrial fibrillation: Secondary | ICD-10-CM

## 2023-08-09 DIAGNOSIS — C9110 Chronic lymphocytic leukemia of B-cell type not having achieved remission: Secondary | ICD-10-CM

## 2023-08-09 DIAGNOSIS — I4819 Other persistent atrial fibrillation: Secondary | ICD-10-CM

## 2023-08-09 DIAGNOSIS — Z6841 Body Mass Index (BMI) 40.0 and over, adult: Secondary | ICD-10-CM

## 2023-08-09 DIAGNOSIS — I890 Lymphedema, not elsewhere classified: Secondary | ICD-10-CM

## 2023-08-09 DIAGNOSIS — E559 Vitamin D deficiency, unspecified: Secondary | ICD-10-CM

## 2023-08-09 DIAGNOSIS — I700 Atherosclerosis of aorta: Secondary | ICD-10-CM

## 2023-08-09 DIAGNOSIS — E782 Mixed hyperlipidemia: Secondary | ICD-10-CM

## 2023-08-09 DIAGNOSIS — G2581 Restless legs syndrome: Secondary | ICD-10-CM

## 2023-08-09 DIAGNOSIS — F5104 Psychophysiologic insomnia: Secondary | ICD-10-CM

## 2023-08-09 DIAGNOSIS — Z0000 Encounter for general adult medical examination without abnormal findings: Secondary | ICD-10-CM

## 2023-08-09 NOTE — Assessment & Plan Note (Signed)
 Likely contributes to lymphatic dysfunction

## 2023-08-09 NOTE — Assessment & Plan Note (Signed)
On anticoagulation 

## 2023-08-09 NOTE — Assessment & Plan Note (Signed)
 lipid control important in reducing the progression of atherosclerotic disease. Continue statin therapy

## 2023-08-09 NOTE — Progress Notes (Signed)
 MRN : [id number]  Erika Anderson is a 73 y.o. ([birth year]) female who presents with chief complaint of  Chief Complaint  Patient presents with   Follow-up    f/u in 1 year with no studies  .  History of Present Illness: Patient returns today in follow up of her lymphedema and leg swelling.  She is continue to have good control of her leg swelling now for the past year or more.  She has occasional leg swelling that is more prominent on the left than the right, and when the swelling becomes more severe she uses the lymphedema pump more regularly.  She states she really has not been using it daily or that regularly because her swelling has been under good control.  Compression and elevation seem to be keeping this under good control.  She is also continuing a good level of activity.  No open wounds or infection.  No chest pain or shortness of breath.  Current Outpatient Medications  Medication Sig Dispense Refill   betamethasone dipropionate 0.05 % lotion Apply topically daily.     citalopram (CELEXA) 20 MG tablet Take 1 tablet by mouth once daily 90 tablet 0   ELIQUIS 5 MG TABS tablet Take 1 tablet by mouth twice daily 180 tablet 0   gabapentin (NEURONTIN) 300 MG capsule TAKE 2 CAPSULES BY MOUTH AT BEDTIME 180 capsule 0   Iron-Vitamin C 65-125 MG TABS Take 1 tablet by mouth every other day. 30 tablet 1   ketoconazole (NIZORAL) 2 % shampoo Apply topically 3 (three) times a week.     metoprolol tartrate (LOPRESSOR) 25 MG tablet Take 1 tablet by mouth twice daily 180 tablet 4   Multiple Vitamins-Minerals (MULTIVITAMIN WITH MINERALS) tablet Take 1 tablet by mouth daily.     pantoprazole (PROTONIX) 40 MG tablet Take 1 tablet (40 mg total) by mouth daily. 90 tablet 3   rOPINIRole (REQUIP) 1 MG tablet Take 1 tablet by mouth 4 times daily 360 tablet 4   rosuvastatin (CRESTOR) 20 MG tablet Take 1 tablet (20 mg total) by mouth daily. 90 tablet 4   traZODone (DESYREL) 50 MG tablet TAKE 1/2 TO 1  (ONE-HALF TO ONE) TABLET BY MOUTH AT BEDTIME AS NEEDED FOR SLEEP 90 tablet 4   zanubrutinib (BRUKINSA) 80 MG capsule Take 2 capsules (160 mg total) by mouth 2 (two) times daily. 120 capsule 3   No current facility-administered medications for this visit.    Past Medical History:  Diagnosis Date   Atrial fibrillation (HCC)    Chronic lymphocytic leukemia (HCC)    Grade II diastolic dysfunction    Hyperlipidemia    Leukemia (HCC)    Mild mitral regurgitation by prior echocardiogram    Obesity (BMI 30-39.9)    Restless legs syndrome (RLS) 12/28/2014   RLS (restless legs syndrome)     Past Surgical History:  Procedure Laterality Date   ATRIAL FIBRILLATION ABLATION N/A 03/22/2022   Procedure: ATRIAL FIBRILLATION ABLATION;  Surgeon: Lanier Prude, MD;  Location: [facility];  Service: Cardiovascular;  Laterality: N/A;   CATARACT EXTRACTION W/PHACO Left 05/18/2023   Procedure: CATARACT EXTRACTION PHACO AND INTRAOCULAR LENS PLACEMENT (IOC) LEFT;  Surgeon: Lockie Mola, MD;  Location: [facility];  Service: Ophthalmology;  Laterality: Left;  6.95 0:44.8   CATARACT EXTRACTION W/PHACO Right 05/25/2023   Procedure: CATARACT EXTRACTION PHACO AND INTRAOCULAR LENS PLACEMENT (IOC) RIGHT 5.35 00:35.4;  Surgeon: Lockie Mola, MD;  Location: [facility];  Service:  Ophthalmology;  Laterality: Right;   COLONOSCOPY WITH PROPOFOL N/A 08/24/2021   Procedure: COLONOSCOPY WITH PROPOFOL;  Surgeon: Wyline Mood, MD;  Location: [facility];  Service: Gastroenterology;  Laterality: N/A;   HEMORROIDECTOMY     TOTAL ABDOMINAL HYSTERECTOMY     ovaries remain     Social History   Tobacco Use   Smoking status: Never   Smokeless tobacco: Never   Tobacco comments:    Never smoke 04/19/22  Vaping Use   Vaping status: Never Used  Substance Use Topics   Alcohol use: No    Alcohol/week: 0.0 standard drinks of alcohol   Drug use: No       Family History   Problem Relation Age of Onset   Cancer Mother        ovarian   Dementia Father    Arthritis Sister    Heart disease Brother        MI   Stroke Paternal Grandmother    Cancer Paternal Grandfather        lung   Breast cancer Neg Hx      No Known Allergies   REVIEW OF SYSTEMS (Negative unless checked)  Constitutional: [] Weight loss  [] Fever  [] Chills Cardiac: [] Chest pain   [] Chest pressure   [x] Palpitations   [] Shortness of breath when laying flat   [] Shortness of breath at rest   [] Shortness of breath with exertion. Vascular:  [] Pain in legs with walking   [] Pain in legs at rest   [] Pain in legs when laying flat   [] Claudication   [] Pain in feet when walking  [] Pain in feet at rest  [] Pain in feet when laying flat   [] History of DVT   [] Phlebitis   [x] Swelling in legs   [] Varicose veins   [] Non-healing ulcers Pulmonary:   [] Uses home oxygen   [] Productive cough   [] Hemoptysis   [] Wheeze  [] COPD   [] Asthma Neurologic:  [] Dizziness  [] Blackouts   [] Seizures   [] History of stroke   [] History of TIA  [] Aphasia   [] Temporary blindness   [] Dysphagia   [] Weakness or numbness in arms   [] Weakness or numbness in legs Musculoskeletal:  [x] Arthritis   [] Joint swelling   [x] Joint pain   [] Low back pain Hematologic:  [] Easy bruising  [] Easy bleeding   [] Hypercoagulable state   [] Anemic   Gastrointestinal:  [] Blood in stool   [] Vomiting blood  [] Gastroesophageal reflux/heartburn   [] Abdominal pain Genitourinary:  [] Chronic kidney disease   [] Difficult urination  [] Frequent urination  [] Burning with urination   [] Hematuria Skin:  [] Rashes   [] Ulcers   [] Wounds Psychological:  [] History of anxiety   []  History of major depression.  Physical Examination  BP (!) 161/84   Pulse 60   Resp 18   Ht 5\' 6"  (1.676 m)   Wt 264 lb 6.4 oz (119.9 kg)   BMI 42.68 kg/m  Gen:  WD/WN, NAD Head: [place]/AT, No temporalis wasting. Ear/Nose/Throat: Hearing grossly intact, nares w/o erythema or drainage Eyes:  Conjunctiva clear. Sclera non-icteric Neck: Supple.  Trachea midline Pulmonary:  Good air movement, no use of accessory muscles.  Cardiac: RRR, no JVD Vascular:  Vessel Right Left  Radial Palpable Palpable               Musculoskeletal: M/S 5/5 throughout.  No deformity or atrophy.  Trace right lower extremity edema, 1+ left lower extremity edema.  Diffuse varicosities present bilaterally more so on the left than the right. Neurologic: Sensation grossly intact in extremities.  Symmetrical.  Speech is fluent.  Psychiatric: Judgment intact, Mood & affect appropriate for pt's clinical situation. Dermatologic: No rashes or ulcers noted.  No cellulitis or open wounds.      Labs Recent Results (from the past 2160 hours)  Influenza A & B (STAT)     Status: None   Collection Time: 06/16/23  9:57 AM  Result Value Ref Range   Influenza A Negative Negative   Influenza B Negative Negative    Comment: If the test is negative for the presence of influenza A or influenza B antigen, infection due to influenza cannot be ruled-out because the antigen present in the sample may be below the detection limit of the test. It is recommended that these results be confirmed by viral culture or an FDA-cleared influenza A and B molecular assay.   Rapid Strep Screen ([facility] ONLY)     Status: None   Collection Time: 06/16/23  9:57 AM   Specimen: Other   Other  Result Value Ref Range   Strep Gp A Ag, IA W/Reflex Negative Negative  Culture, Group A Strep     Status: None   Collection Time: 06/16/23  9:57 AM   Other  Result Value Ref Range   Strep A Culture Negative     Comment:                             Reference Range: Negative  CBC w/Diff     Status: None   Collection Time: 06/16/23 10:24 AM  Result Value Ref Range   WBC 8.1 3.4 - 10.8 x10E3/uL   RBC 4.28 3.77 - 5.28 x10E6/uL   Hemoglobin 13.0 11.1 - 15.9 g/dL   Hematocrit 16.1 09.6 - 46.6 %   MCV 93 79 - 97 fL   MCH 30.4 26.6 - 33.0  pg   MCHC 32.8 31.5 - 35.7 g/dL   RDW 04.5 40.9 - 81.1 %   Platelets 196 150 - 450 x10E3/uL   Neutrophils 50 Not Estab. %   Lymphs 38 Not Estab. %   Monocytes 9 Not Estab. %   Eos 2 Not Estab. %   Basos 1 Not Estab. %   Neutrophils Absolute 4.0 1.4 - 7.0 x10E3/uL   Lymphocytes Absolute 3.1 0.7 - 3.1 x10E3/uL   Monocytes Absolute 0.8 0.1 - 0.9 x10E3/uL   EOS (ABSOLUTE) 0.1 0.0 - 0.4 x10E3/uL   Basophils Absolute 0.1 0.0 - 0.2 x10E3/uL   Immature Granulocytes 0 Not Estab. %   Immature Grans (Abs) 0.0 0.0 - 0.1 x10E3/uL  D-Dimer, Quantitative     Status: None   Collection Time: 06/16/23 10:24 AM  Result Value Ref Range   D-DIMER 0.29 0.00 - 0.49 mg/L FEU    Comment: According to the assay manufacturer's published package insert, a normal (<0.50 mg/L FEU) D-dimer result in conjunction with a non-high clinical probability assessment, excludes deep vein thrombosis (DVT) and pulmonary embolism (PE) with high sensitivity. D-dimer values increase with age and this can make VTE exclusion of an older population difficult. To address this, the [employer], based on best available evidence and recent guidelines, recommends that clinicians use age-adjusted D-dimer thresholds in patients greater than 8 years of age with: a) a low probability of PE who do not meet all Pulmonary Embolism Rule Out Criteria, or b) in those with intermediate probability of PE. The formula for an age-adjusted D-dimer cut-off is "age/100". For example,  a 72 year old patient would have an age-adjusted cut-off of 0.60 mg/L FEU and an 72 year old 0.80 mg/L FEU.   Novel Coronavirus, NAA ([facility])     Status: None   Collection Time: 06/16/23  3:28 PM   Specimen: Nasopharyngeal(NP) swabs in vial transport medium  Result Value Ref Range   SARS-CoV-2, NAA Not Detected Not Detected    Comment: This nucleic acid amplification test was developed and its performance characteristics determined by [employer]. Nucleic acid amplification tests include RT-PCR and TMA. This test has not been FDA cleared or approved. This test has been authorized by FDA under an Emergency Use Authorization (EUA). This test is only authorized for the duration of time the declaration that circumstances exist justifying the authorization of the emergency use of in vitro diagnostic tests for detection of SARS-CoV-2 virus and/or diagnosis of COVID-19 infection under section 564(b)(1) of the Act, 21 U.S.C. 161WRU-0(A) (1), unless the authorization is terminated or revoked sooner. When diagnostic testing is negative, the possibility of a false negative result should be considered in the context of a patient's recent exposures and the presence of clinical signs and symptoms consistent with COVID-19. An individual without symptoms of COVID-19 and who is not shedding SARS-CoV-2 virus wo uld expect to have a negative (not detected) result in this assay.   Lactate dehydrogenase     Status: None   Collection Time: 07/19/23 10:02 AM  Result Value Ref Range   LDH 155 98 - 192 U/L    Comment: Performed at [facility], [address] [place], [place] [address]  Ferritin     Status: None   Collection Time: 07/19/23 10:02 AM  Result Value Ref Range   Ferritin 61 11 - 307 ng/mL    Comment: Performed at [facility], [address] [place], [place] [address]  Iron and TIBC     Status: None   Collection Time: 07/19/23 10:02 AM  Result Value Ref Range   Iron 92 28 - 170 ug/dL   TIBC 782 956 - 213 ug/dL   Saturation Ratios 28 10.4 - 31.8 %   UIBC 243 ug/dL    Comment: Performed at [facility], [address] [place], [place] [address]  CMP (Cancer Center only)     Status: Abnormal   Collection Time: 07/19/23 10:02 AM  Result Value Ref Range   Sodium 139 135 - 145 mmol/L   Potassium 3.7 3.5 - 5.1 mmol/L   Chloride 104 98 - 111 mmol/L   CO2 28 22 - 32 mmol/L   Glucose, Bld 94 70 -  99 mg/dL    Comment: Glucose reference range applies only to samples taken after fasting for at least 8 hours.   BUN 17 8 - 23 mg/dL   Creatinine 8.46 (H) 9.62 - 1.00 mg/dL   Calcium 8.9 8.9 - 95.2 mg/dL   Total Protein 6.9 6.5 - 8.1 g/dL   Albumin 4.0 3.5 - 5.0 g/dL   AST 21 15 - 41 U/L   ALT 19 0 - 44 U/L   Alkaline Phosphatase 64 38 - 126 U/L   Total Bilirubin 0.7 0.0 - 1.2 mg/dL   GFR, Estimated 60 (L) >60 mL/min    Comment: (NOTE) Calculated using the CKD-EPI Creatinine Equation (2021)    Anion gap 7 5 - 15    Comment: Performed at [facility], [address], [place], [place] [address]  CBC with Differential (Cancer Center Only)  Status: None   Collection Time: 07/19/23 10:02 AM  Result Value Ref Range   WBC Count 7.0 4.0 - 10.5 K/uL   RBC 4.26 3.87 - 5.11 MIL/uL   Hemoglobin 12.7 12.0 - 15.0 g/dL   HCT 96.0 45.4 - 09.8 %   MCV 91.8 80.0 - 100.0 fL   MCH 29.8 26.0 - 34.0 pg   MCHC 32.5 30.0 - 36.0 g/dL   RDW 11.9 14.7 - 82.9 %   Platelet Count 197 150 - 400 K/uL   nRBC 0.0 0.0 - 0.2 %   Neutrophils Relative % 61 %   Neutro Abs 4.2 1.7 - 7.7 K/uL   Lymphocytes Relative 28 %   Lymphs Abs 2.0 0.7 - 4.0 K/uL   Monocytes Relative 9 %   Monocytes Absolute 0.6 0.1 - 1.0 K/uL   Eosinophils Relative 1 %   Eosinophils Absolute 0.1 0.0 - 0.5 K/uL   Basophils Relative 1 %   Basophils Absolute 0.0 0.0 - 0.1 K/uL   Immature Granulocytes 0 %   Abs Immature Granulocytes 0.02 0.00 - 0.07 K/uL    Comment: Performed at [facility], [address], [place], [place] [address]    Radiology No results found.  Assessment/Plan  Hyperlipidemia lipid control important in reducing the progression of atherosclerotic disease. Continue statin therapy   CLL (chronic lymphocytic leukemia) (HCC) Likely contributes to lymphatic dysfunction  Atrial fibrillation (HCC) On anticoagulation  Lymphedema At current, her symptom control is good.  We have discussed the  continued use of compression, elevation, and exercise to keep her symptoms under control.  The lymphedema pump can also be used more regularly if she has worsening swelling.  No new plans at this time.  Follow-up in 1 year.    Festus Barren, MD  08/09/2023 9:28 AM    This note was created with Dragon medical transcription system.  Any errors from dictation are purely unintentional

## 2023-08-09 NOTE — Telephone Encounter (Signed)
Physical appt scheduled

## 2023-08-09 NOTE — Assessment & Plan Note (Signed)
 At current, her symptom control is good.  We have discussed the continued use of compression, elevation, and exercise to keep her symptoms under control.  The lymphedema pump can also be used more regularly if she has worsening swelling.  No new plans at this time.  Follow-up in 1 year.

## 2023-08-10 ENCOUNTER — Other Ambulatory Visit: Payer: Self-pay

## 2023-08-22 DIAGNOSIS — L218 Other seborrheic dermatitis: Secondary | ICD-10-CM | POA: Diagnosis not present

## 2023-08-22 DIAGNOSIS — D492 Neoplasm of unspecified behavior of bone, soft tissue, and skin: Secondary | ICD-10-CM | POA: Diagnosis not present

## 2023-08-22 DIAGNOSIS — L738 Other specified follicular disorders: Secondary | ICD-10-CM | POA: Diagnosis not present

## 2023-08-22 DIAGNOSIS — L821 Other seborrheic keratosis: Secondary | ICD-10-CM | POA: Diagnosis not present

## 2023-08-27 NOTE — Patient Instructions (Signed)
 Be Involved in Caring For Your Health:  Taking Medications When medications are taken as directed, they can greatly improve your health. But if they are not taken as prescribed, they may not work. In some cases, not taking them correctly can be harmful. To help ensure your treatment remains effective and safe, understand your medications and how to take them. Bring your medications to each visit for review by your provider.  Your lab results, notes, and after visit summary will be available on My Chart. We strongly encourage you to use this feature. If lab results are abnormal the clinic will contact you with the appropriate steps. If the clinic does not contact you assume the results are satisfactory. You can always view your results on My Chart. If you have questions regarding your health or results, please contact the clinic during office hours. You can also ask questions on My Chart.  We at [facility] are grateful that you chose [place] to provide your care. We strive to provide evidence-based and compassionate care and are always looking for feedback. If you get a survey from the clinic please complete this so we can hear your opinions.  Heart-Healthy Eating Plan Many factors influence your heart health, including eating and exercise habits. Heart health is also called coronary health. Coronary risk increases with abnormal blood fat (lipid) levels. A heart-healthy eating plan includes limiting unhealthy fats, increasing healthy fats, limiting salt (sodium) intake, and making other diet and lifestyle changes. What is my plan? Your health care provider may recommend that: You limit your fat intake to _________% or less of your total calories each day. You limit your saturated fat intake to _________% or less of your total calories each day. You limit the amount of cholesterol in your diet to less than _________ mg per day. You limit the amount of sodium in your diet to less than _________  mg per day. What are tips for following this plan? Cooking Cook foods using methods other than frying. Baking, boiling, grilling, and broiling are all good options. Other ways to reduce fat include: Removing the skin from poultry. Removing all visible fats from meats. Steaming vegetables in water or broth. Meal planning  At meals, imagine dividing your plate into fourths: Fill one-half of your plate with vegetables and green salads. Fill one-fourth of your plate with whole grains. Fill one-fourth of your plate with lean protein foods. Eat 2-4 cups of vegetables per day. One cup of vegetables equals 1 cup (91 g) broccoli or cauliflower florets, 2 medium carrots, 1 large bell pepper, 1 large sweet potato, 1 large tomato, 1 medium white potato, 2 cups (150 g) raw leafy greens. Eat 1-2 cups of fruit per day. One cup of fruit equals 1 small apple, 1 large banana, 1 cup (237 g) mixed fruit, 1 large orange,  cup (82 g) dried fruit, 1 cup (240 mL) 100% fruit juice. Eat more foods that contain soluble fiber. Examples include apples, broccoli, carrots, beans, peas, and barley. Aim to get 25-30 g of fiber per day. Increase your consumption of legumes, nuts, and seeds to 4-5 servings per week. One serving of dried beans or legumes equals  cup (90 g) cooked, 1 serving of nuts is  oz (12 almonds, 24 pistachios, or 7 walnut halves), and 1 serving of seeds equals  oz (8 g). Fats Choose healthy fats more often. Choose monounsaturated and polyunsaturated fats, such as olive and canola oils, avocado oil, flaxseeds, walnuts, almonds, and seeds. Eat  more omega-3 fats. Choose salmon, mackerel, sardines, tuna, flaxseed oil, and ground flaxseeds. Aim to eat fish at least 2 times each week. Check food labels carefully to identify foods with trans fats or high amounts of saturated fat. Limit saturated fats. These are found in animal products, such as meats, butter, and cream. Plant sources of saturated fats  include palm oil, palm kernel oil, and coconut oil. Avoid foods with partially hydrogenated oils in them. These contain trans fats. Examples are stick margarine, some tub margarines, cookies, crackers, and other baked goods. Avoid fried foods. General information Eat more home-cooked food and less restaurant, buffet, and fast food. Limit or avoid alcohol. Limit foods that are high in added sugar and simple starches such as foods made using white refined flour (white breads, pastries, sweets). Lose weight if you are overweight. Losing just 5-10% of your body weight can help your overall health and prevent diseases such as diabetes and heart disease. Monitor your sodium intake, especially if you have high blood pressure. Talk with your health care provider about your sodium intake. Try to incorporate more vegetarian meals weekly. What foods should I eat? Fruits All fresh, canned (in natural juice), or frozen fruits. Vegetables Fresh or frozen vegetables (raw, steamed, roasted, or grilled). Green salads. Grains Most grains. Choose whole wheat and whole grains most of the time. Rice and pasta, including brown rice and pastas made with whole wheat. Meats and other proteins Lean, well-trimmed beef, veal, pork, and lamb. Chicken and [place] without skin. All fish and shellfish. Wild duck, rabbit, pheasant, and venison. Egg whites or low-cholesterol egg substitutes. Dried beans, peas, lentils, and tofu. Seeds and most nuts. Dairy Low-fat or nonfat cheeses, including ricotta and mozzarella. Skim or 1% milk (liquid, powdered, or evaporated). Buttermilk made with low-fat milk. Nonfat or low-fat yogurt. Fats and oils Non-hydrogenated (trans-free) margarines. Vegetable oils, including soybean, sesame, sunflower, olive, avocado, peanut, safflower, corn, canola, and cottonseed. Salad dressings or mayonnaise made with a vegetable oil. Beverages Water (mineral or sparkling). Coffee and tea. Unsweetened ice  tea. Diet beverages. Sweets and desserts Sherbet, gelatin, and fruit ice. Small amounts of dark chocolate. Limit all sweets and desserts. Seasonings and condiments All seasonings and condiments. The items listed above may not be a complete list of foods and beverages you can eat. Contact a dietitian for more options. What foods should I avoid? Fruits Canned fruit in heavy syrup. Fruit in cream or butter sauce. Fried fruit. Limit coconut. Vegetables Vegetables cooked in cheese, cream, or butter sauce. Fried vegetables. Grains Breads made with saturated or trans fats, oils, or whole milk. Croissants. Sweet rolls. Donuts. High-fat crackers, such as cheese crackers and chips. Meats and other proteins Fatty meats, such as hot dogs, ribs, sausage, bacon, rib-eye roast or steak. High-fat deli meats, such as salami and bologna. Caviar. Domestic duck and goose. Organ meats, such as liver. Dairy Cream, sour cream, cream cheese, and creamed cottage cheese. Whole-milk cheeses. Whole or 2% milk (liquid, evaporated, or condensed). Whole buttermilk. Cream sauce or high-fat cheese sauce. Whole-milk yogurt. Fats and oils Meat fat, or shortening. Cocoa butter, hydrogenated oils, palm oil, coconut oil, palm kernel oil. Solid fats and shortenings, including bacon fat, salt pork, lard, and butter. Nondairy cream substitutes. Salad dressings with cheese or sour cream. Beverages Regular sodas and any drinks with added sugar. Sweets and desserts Frosting. Pudding. Cookies. Cakes. Pies. Milk chocolate or white chocolate. Buttered syrups. Full-fat ice cream or ice cream drinks. The items listed above may  not be a complete list of foods and beverages to avoid. Contact a dietitian for more information. Summary Heart-healthy meal planning includes limiting unhealthy fats, increasing healthy fats, limiting salt (sodium) intake and making other diet and lifestyle changes. Lose weight if you are overweight. Losing just  5-10% of your body weight can help your overall health and prevent diseases such as diabetes and heart disease. Focus on eating a balance of foods, including fruits and vegetables, low-fat or nonfat dairy, lean protein, nuts and legumes, whole grains, and heart-healthy oils and fats. This information is not intended to replace advice given to you by your health care provider. Make sure you discuss any questions you have with your health care provider. Document Revised: 06/29/2021 Document Reviewed: 06/29/2021 Elsevier Patient Education  2024 [employer].

## 2023-08-29 ENCOUNTER — Ambulatory Visit (INDEPENDENT_AMBULATORY_CARE_PROVIDER_SITE_OTHER): Admitting: Nurse Practitioner

## 2023-08-29 ENCOUNTER — Encounter: Payer: Self-pay | Admitting: Nurse Practitioner

## 2023-08-29 VITALS — BP 136/70 | HR 54 | Temp 97.9°F | Ht 65.5 in | Wt 264.6 lb

## 2023-08-29 DIAGNOSIS — I480 Paroxysmal atrial fibrillation: Secondary | ICD-10-CM | POA: Diagnosis not present

## 2023-08-29 DIAGNOSIS — I4819 Other persistent atrial fibrillation: Secondary | ICD-10-CM

## 2023-08-29 DIAGNOSIS — Z6841 Body Mass Index (BMI) 40.0 and over, adult: Secondary | ICD-10-CM

## 2023-08-29 DIAGNOSIS — I700 Atherosclerosis of aorta: Secondary | ICD-10-CM | POA: Diagnosis not present

## 2023-08-29 DIAGNOSIS — D6869 Other thrombophilia: Secondary | ICD-10-CM

## 2023-08-29 DIAGNOSIS — G2581 Restless legs syndrome: Secondary | ICD-10-CM | POA: Diagnosis not present

## 2023-08-29 DIAGNOSIS — E782 Mixed hyperlipidemia: Secondary | ICD-10-CM

## 2023-08-29 DIAGNOSIS — F5104 Psychophysiologic insomnia: Secondary | ICD-10-CM

## 2023-08-29 DIAGNOSIS — E559 Vitamin D deficiency, unspecified: Secondary | ICD-10-CM | POA: Diagnosis not present

## 2023-08-29 DIAGNOSIS — Z0000 Encounter for general adult medical examination without abnormal findings: Secondary | ICD-10-CM

## 2023-08-29 DIAGNOSIS — I890 Lymphedema, not elsewhere classified: Secondary | ICD-10-CM

## 2023-08-29 DIAGNOSIS — C9110 Chronic lymphocytic leukemia of B-cell type not having achieved remission: Secondary | ICD-10-CM

## 2023-08-29 MED ORDER — GABAPENTIN 300 MG PO CAPS: 600.0000 mg | ORAL_CAPSULE | Freq: Every day | ORAL | 3 refills | Status: DC

## 2023-08-29 MED ORDER — METOPROLOL TARTRATE 25 MG PO TABS: 25.0000 mg | ORAL_TABLET | Freq: Two times a day (BID) | ORAL | 4 refills | Status: AC

## 2023-08-29 MED ORDER — APIXABAN 5 MG PO TABS: 5.0000 mg | ORAL_TABLET | Freq: Two times a day (BID) | ORAL | 3 refills | Status: AC

## 2023-08-29 MED ORDER — ROPINIROLE HCL 1 MG PO TABS: 1.0000 mg | ORAL_TABLET | Freq: Four times a day (QID) | ORAL | 4 refills | Status: AC

## 2023-08-29 MED ORDER — TRAZODONE HCL 50 MG PO TABS: 50.0000 mg | ORAL_TABLET | Freq: Every evening | ORAL | 4 refills | Status: AC | PRN

## 2023-08-29 MED ORDER — CITALOPRAM HYDROBROMIDE 20 MG PO TABS
20.0000 mg | ORAL_TABLET | Freq: Every day | ORAL | 3 refills | Status: AC
Start: 2023-08-29 — End: ?

## 2023-08-29 MED ORDER — ROSUVASTATIN CALCIUM 20 MG PO TABS: 20.0000 mg | ORAL_TABLET | Freq: Every day | ORAL | 4 refills | Status: AC

## 2023-08-29 NOTE — Assessment & Plan Note (Signed)
Ongoing.  Noted on imaging 05/03/18.  Continue statin for prevention and monitor closely.

## 2023-08-29 NOTE — Progress Notes (Signed)
 BP 136/70 (BP Location: Left Arm, Patient Position: Sitting, Cuff Size: Large)   Pulse (!) 54   Temp 97.9 F (36.6 C) (Oral)   Ht 5' 5.5" (1.664 m)   Wt 264 lb 9.6 oz (120 kg)   SpO2 96%   BMI 43.36 kg/m    Subjective:    Patient ID: Erika Anderson, female    DOB: August 29, 1950, 73 y.o.   MRN: [id number]  HPI: Erika Anderson is a 73 y.o. female presenting on 08/29/2023 for comprehensive medical examination. Current medical complaints include:none  She currently lives with: husband Menopausal Symptoms: no  ATRIAL FIBRILLATION & CLL Takes Metoprolol and Eliquis. Last visit with cardiology 06/23/22, no recent visits.  Ablations performed on 03/22/22 and 05/22/22.     CLL diagnosed on 04/01/22.  Is followed by Dr. Cathie Hoops.  Last oncology visit was 07/19/23.  Taking Zanubrutinib 160 MG BID, gets this via assistance.   Atrial fibrillation status: stable Satisfied with current treatment: yes  Medication side effects:  no Medication compliance: good compliance Etiology of atrial fibrillation: unknown Palpitations: no Chest pain: no Dyspnea on exertion: with activity at baseline, had pulmonary work-up in past Orthopnea: no Syncope:  no Edema: at baseline due to lymphedema, uses pumps at home Ventricular rate control: B-blocker Anti-coagulation: long acting    HYPERLIPIDEMIA Taking Rosuvastatin daily.   Satisfied with current treatment? yes Duration of hyperlipidemia: chronic Cholesterol medication side effects: no Cholesterol supplements: none Medication compliance: good compliance Aspirin: no Recent stressors: no Recurrent headaches: no Visual changes: no Palpitations: no Dyspnea: as above Chest pain: no Lower extremity edema: as above Dizzy/lightheaded: no  The 10-year ASCVD risk score (Arnett DK, et al., 2019) is: 14.5%   Values used to calculate the score:     Age: 51 years     Sex: Female     Is Non-Hispanic African American: No     Diabetic: No     Tobacco  smoker: No     Systolic Blood Pressure: 136 mmHg     Is BP treated: Yes     HDL Cholesterol: 56 mg/dL     Total Cholesterol: 149 mg/dL  RESTLESS LEGS Takes Requip 1 MG QID (on max dose) + Gabapentin 600 MG at night for back pain, sometimes will take this during day. Continues Trazodone for insomnia.  Takes Celexa for mood. Duration: chronic Discomfort description:  ill-defined Pain: no Location: lower legs Bilateral: yes Symmetric: yes Severity: mild Onset:  gradual Frequency:  intermittent Symptoms only occur while legs at rest: yes Sudden unintentional leg jerking: yes Bed partner bothered by leg movements: no LE numbness: no Decreased sensation: no Weakness: no Insomnia: yes Daytime somnolence: no Fatigue: no Alleviating factors: Requip Aggravating factors: rest Status: stable Treatments attempted: as above noted  Depression Screen done today and results listed below:     08/29/2023    9:42 AM 06/21/2023    1:30 PM 06/16/2023    9:58 AM 03/08/2023    9:47 AM 01/13/2023   10:08 AM  Depression screen PHQ 2/9  Decreased Interest 1 0 0 0 1  Down, Depressed, Hopeless 0 0 0 0 0  PHQ - 2 Score 1 0 0 0 1  Altered sleeping 1 1 2  0 3  Tired, decreased energy 1 0 1 0 2  Change in appetite 1 0 0 0 2  Feeling bad or failure about yourself  0 0 0 0 0  Trouble concentrating 0 0 0 0 0  Moving slowly  or fidgety/restless 0 0 0 0 1  Suicidal thoughts 0 0 0 0 0  PHQ-9 Score 4 1 3  0 9  Difficult doing work/chores Somewhat difficult Not difficult at all  Not difficult at all Somewhat difficult      08/29/2023    9:42 AM 06/21/2023    1:30 PM 06/16/2023    9:58 AM 01/13/2023   10:08 AM  GAD 7 : Generalized Anxiety Score  Nervous, Anxious, on Edge 0 0 0 0  Control/stop worrying 0 0 0 0  Worry too much - different things 0 0 0 0  Trouble relaxing 1 0 1 2  Restless 1 0 0 2  Easily annoyed or irritable 0 0 0 0  Afraid - awful might happen 0 0 0 0  Total GAD 7 Score 2 0 1 4  Anxiety  Difficulty Not difficult at all Not difficult at all  Somewhat difficult      08/04/2022    8:12 AM 01/13/2023   10:08 AM 03/08/2023    9:49 AM 06/21/2023    1:29 PM 08/29/2023    9:41 AM  Fall Risk  Falls in the past year? 0 0 0 0 0  Was there an injury with Fall? 0 0 0 0 0  Fall Risk Category Calculator 0 0 0 0 0  Patient at Risk for Falls Due to No Fall Risks No Fall Risks No Fall Risks No Fall Risks No Fall Risks  Fall risk Follow up Falls evaluation completed Falls evaluation completed Falls prevention discussed;Falls evaluation completed Falls prevention discussed;Falls evaluation completed Falls evaluation completed    Past Medical History:  Past Medical History:  Diagnosis Date   Atrial fibrillation (HCC)    Chronic lymphocytic leukemia (HCC)    Grade II diastolic dysfunction    Hyperlipidemia    Leukemia (HCC)    Mild mitral regurgitation by prior echocardiogram    Obesity (BMI 30-39.9)    Restless legs syndrome (RLS) 12/28/2014   RLS (restless legs syndrome)     Surgical History:  Past Surgical History:  Procedure Laterality Date   ATRIAL FIBRILLATION ABLATION N/A 03/22/2022   Procedure: ATRIAL FIBRILLATION ABLATION;  Surgeon: Lanier Prude, MD;  Location: [facility];  Service: Cardiovascular;  Laterality: N/A;   CATARACT EXTRACTION W/PHACO Left 05/18/2023   Procedure: CATARACT EXTRACTION PHACO AND INTRAOCULAR LENS PLACEMENT (IOC) LEFT;  Surgeon: Lockie Mola, MD;  Location: [facility];  Service: Ophthalmology;  Laterality: Left;  6.95 0:44.8   CATARACT EXTRACTION W/PHACO Right 05/25/2023   Procedure: CATARACT EXTRACTION PHACO AND INTRAOCULAR LENS PLACEMENT (IOC) RIGHT 5.35 00:35.4;  Surgeon: Lockie Mola, MD;  Location: [facility];  Service: Ophthalmology;  Laterality: Right;   COLONOSCOPY WITH PROPOFOL N/A 08/24/2021   Procedure: COLONOSCOPY WITH PROPOFOL;  Surgeon: Wyline Mood, MD;  Location: [facility] ENDOSCOPY;  Service:  Gastroenterology;  Laterality: N/A;   HEMORROIDECTOMY     TOTAL ABDOMINAL HYSTERECTOMY     ovaries remain    Medications:  Current Outpatient Medications on File Prior to Visit  Medication Sig   betamethasone dipropionate 0.05 % lotion Apply topically daily.   Iron-Vitamin C 65-125 MG TABS Take 1 tablet by mouth every other day.   ketoconazole (NIZORAL) 2 % shampoo Apply topically 3 (three) times a week.   Multiple Vitamins-Minerals (MULTIVITAMIN WITH MINERALS) tablet Take 1 tablet by mouth daily.   pantoprazole (PROTONIX) 40 MG tablet Take 1 tablet (40 mg total) by mouth daily.   zanubrutinib (BRUKINSA) 80  MG capsule Take 2 capsules (160 mg total) by mouth 2 (two) times daily.   No current facility-administered medications on file prior to visit.    Allergies:  No Known Allergies  Social History:  Social History   Socioeconomic History   Marital status: Married    Spouse name: Not on file   Number of children: Not on file   Years of education: 12   Highest education level: High school graduate  Occupational History   Occupation: retired  Tobacco Use   Smoking status: Never   Smokeless tobacco: Never   Tobacco comments:    Never smoke 04/19/22  Vaping Use   Vaping status: Never Used  Substance and Sexual Activity   Alcohol use: No    Alcohol/week: 0.0 standard drinks of alcohol   Drug use: No   Sexual activity: Not Currently  Other Topics Concern   Not on file  Social History Narrative   Helps take care of grandchildren    Social Drivers of Health   Financial Resource Strain: Low Risk  (03/08/2023)   Overall Financial Resource Strain (CARDIA)    Difficulty of Paying Living Expenses: Not hard at all  Food Insecurity: No Food Insecurity (03/08/2023)   Hunger Vital Sign    Worried About Running Out of Food in the Last Year: Never true    Ran Out of Food in the Last Year: Never true  Transportation Needs: No Transportation Needs (03/08/2023)   PRAPARE -  Administrator, Civil Service (Medical): No    Lack of Transportation (Non-Medical): No  Physical Activity: Sufficiently Active (03/08/2023)   Exercise Vital Sign    Days of Exercise per Week: 7 days    Minutes of Exercise per Session: 30 min  Stress: No Stress Concern Present (03/08/2023)   [employer] of Occupational Health - Occupational Stress Questionnaire    Feeling of Stress : Not at all  Social Connections: Moderately Integrated (03/08/2023)   Social Connection and Isolation Panel [NHANES]    Frequency of Communication with Friends and Family: More than three times a week    Frequency of Social Gatherings with Friends and Family: Twice a week    Attends Religious Services: More than 4 times per year    Active Member of [employer] or Organizations: No    Attends Banker Meetings: Never    Marital Status: Married  Catering manager Violence: Not At Risk (03/08/2023)   Humiliation, Afraid, Rape, and Kick questionnaire    Fear of Current or Ex-Partner: No    Emotionally Abused: No    Physically Abused: No    Sexually Abused: No   Social History   Tobacco Use  Smoking Status Never  Smokeless Tobacco Never  Tobacco Comments   Never smoke 04/19/22   Social History   Substance and Sexual Activity  Alcohol Use No   Alcohol/week: 0.0 standard drinks of alcohol    Family History:  Family History  Problem Relation Age of Onset   Cancer Mother        ovarian   Dementia Father    Arthritis Sister    Heart disease Brother        MI   Stroke Paternal Grandmother    Cancer Paternal Grandfather        lung   Breast cancer Neg Hx     Past medical history, surgical history, medications, allergies, family history and social history reviewed with patient today and changes made to appropriate areas  of the chart.   ROS All other ROS negative except what is listed above and in the HPI.      Objective:    BP 136/70 (BP Location: Left Arm, Patient  Position: Sitting, Cuff Size: Large)   Pulse (!) 54   Temp 97.9 F (36.6 C) (Oral)   Ht 5' 5.5" (1.664 m)   Wt 264 lb 9.6 oz (120 kg)   SpO2 96%   BMI 43.36 kg/m   Wt Readings from Last 3 Encounters:  08/29/23 264 lb 9.6 oz (120 kg)  08/09/23 264 lb 6.4 oz (119.9 kg)  07/19/23 261 lb 1.6 oz (118.4 kg)    Physical Exam Vitals and nursing note reviewed. Exam conducted with a chaperone present.  Constitutional:      General: She is awake. She is not in acute distress.    Appearance: She is well-developed and well-groomed. She is obese. She is not ill-appearing or toxic-appearing.  HENT:     Head: Normocephalic and atraumatic.     Right Ear: Hearing, tympanic membrane, ear canal and external ear normal. No drainage.     Left Ear: Hearing, tympanic membrane, ear canal and external ear normal. No drainage.     Nose: Nose normal.     Right Sinus: No maxillary sinus tenderness or frontal sinus tenderness.     Left Sinus: No maxillary sinus tenderness or frontal sinus tenderness.     Mouth/Throat:     Mouth: Mucous membranes are moist.     Pharynx: Oropharynx is clear. Uvula midline. No pharyngeal swelling, oropharyngeal exudate or posterior oropharyngeal erythema.  Eyes:     General: Lids are normal.        Right eye: No discharge.        Left eye: No discharge.     Extraocular Movements: Extraocular movements intact.     Conjunctiva/sclera: Conjunctivae normal.     Pupils: Pupils are equal, round, and reactive to light.     Visual Fields: Right eye visual fields normal and left eye visual fields normal.  Neck:     Thyroid: No thyromegaly.     Vascular: No carotid bruit.     Trachea: Trachea normal.  Cardiovascular:     Rate and Rhythm: Regular rhythm. Bradycardia present.     Heart sounds: Normal heart sounds. No murmur heard.    No gallop.  Pulmonary:     Effort: Pulmonary effort is normal. No accessory muscle usage or respiratory distress.     Breath sounds: Normal breath  sounds.  Chest:  Breasts:    Right: Normal.     Left: Normal.  Abdominal:     General: Bowel sounds are normal.     Palpations: Abdomen is soft. There is no hepatomegaly or splenomegaly.     Tenderness: There is no abdominal tenderness.  Musculoskeletal:        General: Normal range of motion.     Cervical back: Normal range of motion and neck supple.     Right lower leg: 1+ Edema present.     Left lower leg: 1+ Edema present.  Lymphadenopathy:     Head:     Right side of head: No submental, submandibular, tonsillar, preauricular or posterior auricular adenopathy.     Left side of head: No submental, submandibular, tonsillar, preauricular or posterior auricular adenopathy.     Cervical: No cervical adenopathy.     Upper Body:     Right upper body: No supraclavicular, axillary or pectoral adenopathy.  Left upper body: No supraclavicular, axillary or pectoral adenopathy.  Skin:    General: Skin is warm and dry.     Capillary Refill: Capillary refill takes less than 2 seconds.     Findings: No rash.  Neurological:     Mental Status: She is alert and oriented to person, place, and time.     Gait: Gait is intact.     Deep Tendon Reflexes: Reflexes are normal and symmetric.     Reflex Scores:      Brachioradialis reflexes are 2+ on the right side and 2+ on the left side.      Patellar reflexes are 2+ on the right side and 2+ on the left side. Psychiatric:        Attention and Perception: Attention normal.        Mood and Affect: Mood normal.        Speech: Speech normal.        Behavior: Behavior normal. Behavior is cooperative.        Thought Content: Thought content normal.        Judgment: Judgment normal.     Results for orders placed or performed in visit on 07/19/23  Lactate dehydrogenase   Collection Time: 07/19/23 10:02 AM  Result Value Ref Range   LDH 155 98 - 192 U/L  Ferritin   Collection Time: 07/19/23 10:02 AM  Result Value Ref Range   Ferritin 61 11 - 307  ng/mL  Iron and TIBC   Collection Time: 07/19/23 10:02 AM  Result Value Ref Range   Iron 92 28 - 170 ug/dL   TIBC 409 811 - 914 ug/dL   Saturation Ratios 28 10.4 - 31.8 %   UIBC 243 ug/dL  CMP (Cancer Center only)   Collection Time: 07/19/23 10:02 AM  Result Value Ref Range   Sodium 139 135 - 145 mmol/L   Potassium 3.7 3.5 - 5.1 mmol/L   Chloride 104 98 - 111 mmol/L   CO2 28 22 - 32 mmol/L   Glucose, Bld 94 70 - 99 mg/dL   BUN 17 8 - 23 mg/dL   Creatinine 7.82 (H) 9.56 - 1.00 mg/dL   Calcium 8.9 8.9 - 21.3 mg/dL   Total Protein 6.9 6.5 - 8.1 g/dL   Albumin 4.0 3.5 - 5.0 g/dL   AST 21 15 - 41 U/L   ALT 19 0 - 44 U/L   Alkaline Phosphatase 64 38 - 126 U/L   Total Bilirubin 0.7 0.0 - 1.2 mg/dL   GFR, Estimated 60 (L) >60 mL/min   Anion gap 7 5 - 15  CBC with Differential (Cancer Center Only)   Collection Time: 07/19/23 10:02 AM  Result Value Ref Range   WBC Count 7.0 4.0 - 10.5 K/uL   RBC 4.26 3.87 - 5.11 MIL/uL   Hemoglobin 12.7 12.0 - 15.0 g/dL   HCT 08.6 57.8 - 46.9 %   MCV 91.8 80.0 - 100.0 fL   MCH 29.8 26.0 - 34.0 pg   MCHC 32.5 30.0 - 36.0 g/dL   RDW 62.9 52.8 - 41.3 %   Platelet Count 197 150 - 400 K/uL   nRBC 0.0 0.0 - 0.2 %   Neutrophils Relative % 61 %   Neutro Abs 4.2 1.7 - 7.7 K/uL   Lymphocytes Relative 28 %   Lymphs Abs 2.0 0.7 - 4.0 K/uL   Monocytes Relative 9 %   Monocytes Absolute 0.6 0.1 - 1.0 K/uL   Eosinophils Relative 1 %  Eosinophils Absolute 0.1 0.0 - 0.5 K/uL   Basophils Relative 1 %   Basophils Absolute 0.0 0.0 - 0.1 K/uL   Immature Granulocytes 0 %   Abs Immature Granulocytes 0.02 0.00 - 0.07 K/uL      Assessment & Plan:   Problem List Items Addressed This Visit       Cardiovascular and Mediastinum   Aortic atherosclerosis (HCC) (Chronic)   Ongoing.  Noted on imaging 05/03/18.  Continue statin for prevention and monitor closely.      Relevant Medications   metoprolol tartrate (LOPRESSOR) 25 MG tablet   rosuvastatin (CRESTOR)  20 MG tablet   apixaban (ELIQUIS) 5 MG TABS tablet   Atrial fibrillation (HCC) (Chronic)   Ongoing, stable.  Ablation on 03/22/22 and 05/22/22.  Rate controlled. Continue current medication regimen and adjust as needed.  Continue to collaborate with cardiology, recent notes reviewed.  All questions answered.  Labs today: Lipid, remainder of labs up to date with oncology.      Relevant Medications   metoprolol tartrate (LOPRESSOR) 25 MG tablet   rosuvastatin (CRESTOR) 20 MG tablet   apixaban (ELIQUIS) 5 MG TABS tablet   Other Relevant Orders   TSH   Hypercoagulable state due to paroxysmal atrial fibrillation (HCC)   Chronic, ongoing, on Eliquis.  Continue to monitor.  CBC up to date with oncology.      Relevant Medications   metoprolol tartrate (LOPRESSOR) 25 MG tablet   rosuvastatin (CRESTOR) 20 MG tablet   apixaban (ELIQUIS) 5 MG TABS tablet     Other   BMI 40.0-44.9, adult (HCC) (Chronic)   Refer to morbid obesity plan of care.      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  Diagnosed on 04/01/22.  Continue collaboration with oncology, recent notes reviewed.        Relevant Medications   gabapentin (NEURONTIN) 300 MG capsule   Other Relevant Orders   Magnesium   Hyperlipidemia (Chronic)   Chronic, ongoing.  Continue current medication regimen and adjust as needed.  Lipid panel today.      Relevant Medications   metoprolol tartrate (LOPRESSOR) 25 MG tablet   rosuvastatin (CRESTOR) 20 MG tablet   apixaban (ELIQUIS) 5 MG TABS tablet   Other Relevant Orders   Lipid Panel w/o Chol/HDL Ratio   Insomnia (Chronic)   Chronic, stable.  Continue Trazodone 1/2 a pill in evening as needed and continue to cut back.  Do not take unless needed.  She wishes to continue minimal use.  Discussed mouth appliances for sleep aides.      Morbid obesity (HCC) (Chronic)   BMI 43.36 with HLD, A FIB. Recommended eating smaller high protein, low fat  meals more frequently and exercising 30 mins a day 5 times a week with a goal of 10-15lb weight loss in the next 3 months. Patient voiced their understanding and motivation to adhere to these recommendations.  Would benefit from a GLP1 in future as needs to lose 20 pounds for knee surgery, but unsure insurance will not cover at present.       RLS (restless legs syndrome) (Chronic)   Chronic and stable with Requip and Gabapentin, continue this regimen and adjust as needed.  Monitor kidney function closely with medications.  Labs today: TSH.      Relevant Medications   rOPINIRole (REQUIP) 1 MG tablet   Vitamin D deficiency (Chronic)   Chronic.  Noted on past labs, recommend continue Vitamin D3  2000 units daily, check level today.      Relevant Orders   VITAMIN D 25 Hydroxy (Vit-D Deficiency, Fractures)   Lymphedema   Chronic, ongoing.  Continue collaboration with vascular and compression pumps per them.      Relevant Orders   Magnesium   Other Visit Diagnoses       Encounter for annual physical exam       Annual physical today with labs and health maintenance reviewed, discussed with patient.        Follow up plan: Return in about 6 months (around 02/29/2024) for CLL, HTN/HLD, RLS, MOOD.   LABORATORY TESTING:  - Pap smear: not applicable  IMMUNIZATIONS:   - Tdap: Tetanus vaccination status reviewed: last tetanus booster within 10 years. - Influenza: Up to date - Pneumovax: Up to date - Prevnar: Up to date - COVID: Up to date - HPV: Not applicable - Shingrix vaccine: Up to date  SCREENING: -Mammogram: Up to date  - Colonoscopy: Up to date  - Bone Density: Up to date normal -Hearing Test: Not applicable  -Spirometry: Up to date with pulmonary   PATIENT COUNSELING:   Advised to take 1 mg of folate supplement per day if capable of pregnancy.   Sexuality: Discussed sexually transmitted diseases, partner selection, use of condoms, avoidance of unintended pregnancy  and  contraceptive alternatives.   Advised to avoid cigarette smoking.  I discussed with the patient that most people either abstain from alcohol or drink within safe limits (<=14/week and <=4 drinks/occasion for males, <=7/weeks and <= 3 drinks/occasion for females) and that the risk for alcohol disorders and other health effects rises proportionally with the number of drinks per week and how often a drinker exceeds daily limits.  Discussed cessation/primary prevention of drug use and availability of treatment for abuse.   Diet: Encouraged to adjust caloric intake to maintain  or achieve ideal body weight, to reduce intake of dietary saturated fat and total fat, to limit sodium intake by avoiding high sodium foods and not adding table salt, and to maintain adequate dietary potassium and calcium preferably from fresh fruits, vegetables, and low-fat dairy products.    Stressed the importance of regular exercise  Injury prevention: Discussed safety belts, safety helmets, smoke detector, smoking near bedding or upholstery.   Dental health: Discussed importance of regular tooth brushing, flossing, and dental visits.    NEXT PREVENTATIVE PHYSICAL DUE IN 1 YEAR. Return in about 6 months (around 02/29/2024) for CLL, HTN/HLD, RLS, MOOD.

## 2023-08-29 NOTE — Assessment & Plan Note (Signed)
 Chronic.  Noted on past labs, recommend continue Vitamin D3 2000 units daily, check level today.

## 2023-08-29 NOTE — Assessment & Plan Note (Signed)
Chronic, ongoing.  Continue collaboration with vascular and compression pumps per them.

## 2023-08-29 NOTE — Assessment & Plan Note (Signed)
Chronic, on treatment.  Diagnosed on 04/01/22.  Continue collaboration with oncology, recent notes reviewed.

## 2023-08-29 NOTE — Assessment & Plan Note (Signed)
Chronic, stable.  Continue Trazodone 1/2 a pill in evening as needed and continue to cut back.  Do not take unless needed.  She wishes to continue minimal use.  Discussed mouth appliances for sleep aides.

## 2023-08-29 NOTE — Assessment & Plan Note (Signed)
 Chronic, ongoing.  Continue current medication regimen and adjust as needed. Lipid panel today.

## 2023-08-29 NOTE — Assessment & Plan Note (Signed)
 Refer to morbid obesity plan of care.

## 2023-08-29 NOTE — Assessment & Plan Note (Signed)
Chronic, ongoing, on Eliquis.  Continue to monitor.  CBC up to date with oncology.

## 2023-08-29 NOTE — Assessment & Plan Note (Signed)
 Ongoing, stable.  Ablation on 03/22/22 and 05/22/22.  Rate controlled. Continue current medication regimen and adjust as needed.  Continue to collaborate with cardiology, recent notes reviewed.  All questions answered.  Labs today: Lipid, remainder of labs up to date with oncology.

## 2023-08-29 NOTE — Assessment & Plan Note (Signed)
 BMI 43.36 with HLD, A FIB. Recommended eating smaller high protein, low fat meals more frequently and exercising 30 mins a day 5 times a week with a goal of 10-15lb weight loss in the next 3 months. Patient voiced their understanding and motivation to adhere to these recommendations.  Would benefit from a GLP1 in future as needs to lose 20 pounds for knee surgery, but unsure insurance will not cover at present.

## 2023-08-29 NOTE — Assessment & Plan Note (Signed)
 Chronic and stable with Requip and Gabapentin, continue this regimen and adjust as needed.  Monitor kidney function closely with medications.  Labs today: TSH.

## 2023-08-30 ENCOUNTER — Encounter: Payer: Self-pay | Admitting: Nurse Practitioner

## 2023-08-30 LAB — TSH: TSH: 1.65 u[IU]/mL (ref 0.450–4.500)

## 2023-08-30 LAB — MAGNESIUM: Magnesium: 2 mg/dL (ref 1.6–2.3)

## 2023-08-30 LAB — LIPID PANEL W/O CHOL/HDL RATIO
Cholesterol, Total: 134 mg/dL (ref 100–199)
HDL: 54 mg/dL (ref >39)
LDL Chol Calc (NIH): 61 mg/dL (ref 0–99)
Triglycerides: 100 mg/dL (ref 0–149)
VLDL Cholesterol Cal: 19 mg/dL (ref 5–40)

## 2023-08-30 LAB — VITAMIN D 25 HYDROXY (VIT D DEFICIENCY, FRACTURES): Vit D, 25-Hydroxy: 28.6 ng/mL — ABNORMAL LOW (ref 30.0–100.0)

## 2023-08-30 NOTE — Progress Notes (Signed)
 Contacted via MyChart   Good morning Erika Anderson, your labs have returned and overall are stable.  Vitamin D remains a little low, ensure to take Vitamin D3 2000 units daily for bone health.  Any questions? Keep being amazing!!  Thank you for allowing me to participate in your care.  I appreciate you. Kindest regards, Deseray Daponte

## 2023-09-06 ENCOUNTER — Other Ambulatory Visit: Payer: Self-pay

## 2023-09-06 ENCOUNTER — Other Ambulatory Visit (HOSPITAL_COMMUNITY): Payer: Self-pay

## 2023-09-06 NOTE — Progress Notes (Signed)
 Specialty Pharmacy Refill Coordination Note  Erika Anderson is a 73 y.o. female contacted today regarding refills of specialty medication(s) Brukinsa.  Patient requested (Patient-Rptd) Delivery   Delivery date: (Patient-Rptd) 09/16/18   Verified address: (Patient-Rptd) [address] [place]. [phone]   Medication will be filled on 09/12/23. New delivery date is 09/13/23. Patient has been notified.

## 2023-09-10 ENCOUNTER — Other Ambulatory Visit (HOSPITAL_COMMUNITY): Payer: Self-pay

## 2023-09-12 ENCOUNTER — Other Ambulatory Visit: Payer: Self-pay

## 2023-09-14 ENCOUNTER — Ambulatory Visit: Attending: Cardiology | Admitting: Cardiology

## 2023-09-14 VITALS — BP 132/72 | HR 65 | Ht 65.0 in | Wt 261.6 lb

## 2023-09-14 DIAGNOSIS — I4819 Other persistent atrial fibrillation: Secondary | ICD-10-CM | POA: Diagnosis not present

## 2023-09-14 DIAGNOSIS — D6869 Other thrombophilia: Secondary | ICD-10-CM

## 2023-09-14 NOTE — Progress Notes (Signed)
 Electrophysiology Clinic Note    Date:  09/14/2023  Patient ID:  Erika Anderson, Erika Anderson 1951/04/08, MRN [id number] PCP:  Marjie Skiff, NP  Cardiologist:  Debbe Odea, MD Electrophysiologist: Lanier Prude, MD   Discussed the use of AI scribe software for clinical note transcription with the patient, who gave verbal consent to proceed.   Patient Profile    Chief Complaint: AF follow-up  History of Present Illness: Erika Anderson is a 73 y.o. female with PMH notable for persis AFib; seen today for Lanier Prude, MD for routine electrophysiology followup.   She is s/p AF ablation w PVI, CTI on 05/2022 by Dr. Lalla Brothers. She last saw Dr. Lalla Brothers 06/2022 where she had no recurrence of arrhythmia, eliquis continued.   On follow-up today, she is doing very well from an AFib perspective. She is not aware of any AF episodes since her last appointment. She does have DOE that is stable.  She does not check her BP or pulse regularly at home. She continues to take eliquis BID, no bleeding concerns.    Arrhythmia/Device History No specialty comments available.     ROS:  Please see the history of present illness. All other systems are reviewed and otherwise negative.    Physical Exam    VS:  BP 132/72 (BP Location: Left Arm, Patient Position: Sitting, Cuff Size: Normal)   Pulse 65   Ht 5\' 5"  (1.651 m)   Wt 261 lb 9.6 oz (118.7 kg)   SpO2 95%   BMI 43.53 kg/m  BMI: Body mass index is 43.53 kg/m.  Wt Readings from Last 3 Encounters:  09/14/23 261 lb 9.6 oz (118.7 kg)  08/29/23 264 lb 9.6 oz (120 kg)  08/09/23 264 lb 6.4 oz (119.9 kg)     GEN- The patient is well appearing, alert and oriented x 3 today.   Lungs- Clear to ausculation bilaterally, normal work of breathing.  Heart- Regular rate and rhythm, no murmurs, rubs or gallops Extremities- Trace peripheral edema, warm, dry    Studies Reviewed   Previous EP, cardiology notes.    EKG is ordered.  Personal review of EKG from today shows:    EKG Interpretation Date/Time:  Wednesday September 14 2023 13:42:58 EDT Ventricular Rate:  65 PR Interval:  152 QRS Duration:  72 QT Interval:  426 QTC Calculation: 443 R Axis:   20  Text Interpretation: Normal sinus rhythm Confirmed by Sherie Don [phone]) on 09/14/2023 2:12:02 PM    TTE, 10/15/2021  1. Left ventricular ejection fraction, by estimation, is 45 to 50%. The left ventricle has mildly decreased function. The left ventricle has no regional wall motion abnormalities. Left ventricular diastolic parameters are consistent with Grade II diastolic dysfunction (pseudonormalization). The average left ventricular global longitudinal strain is -13.3 %. The global longitudinal strain is  abnormal.   2. Right ventricular systolic function is low normal. The right ventricular size is mildly enlarged. There is mildly elevated pulmonary artery systolic pressure. The estimated right ventricular systolic pressure is 39.3 mmHg.   3. Left atrial size was mildly dilated.   4. Right atrial size was mildly dilated.   5. The mitral valve is normal in structure. Mild mitral valve regurgitation. No evidence of mitral stenosis.   6. The aortic valve was not well visualized. Aortic valve regurgitation is not visualized. No aortic stenosis is present.   7. The inferior vena cava is dilated in size with >50% respiratory variability, suggesting right atrial  pressure of 8 mmHg.    Assessment and Plan     #) persis Afib S/p AF ablation 2023 by Dr. Lalla Brothers No recurrence of Afib since procedure Continue 25mg  lopressor BID  #) Hypercoag d/t persis afib CHA2DS2-VASc Score = at least 2 [CHF History: 0, HTN History: 0, Diabetes History: 0, Stroke History: 0, Vascular Disease History: 0, Age Score: 1, Gender Score: 1].  Therefore, the patient's annual risk of stroke is 2.2 %.    Stroke ppx - 5mg  eliquis BID, appropriately dosed No bleeding concerns Recent CBC, Cr  stable  #) DOE Requested patient monitor BP and pulse with activity to ensure pulse is rising with activity If DOE persists, consider updated TTE       Current medicines are reviewed at length with the patient today.   The patient does not have concerns regarding her medicines.  The following changes were made today:  none  Labs/ tests ordered today include:  Orders Placed This Encounter  Procedures   EKG 12-Lead     Disposition: Follow up with Dr. Lalla Brothers or EP APP in 12 months, sooner if needed   Signed, Sherie Don, NP  09/14/23  2:12 PM  Electrophysiology [facility]

## 2023-09-14 NOTE — Patient Instructions (Signed)
 Medication Instructions:  The current medical regimen is effective;  continue present plan and medications as directed. Please refer to the Current Medication list given to you today.   *If you need a refill on your cardiac medications before your next appointment, please call your pharmacy*  Follow-Up: At [facility], you and your health needs are our priority.  As part of our continuing mission to provide you with exceptional heart care, our providers are all part of one team.  This team includes your primary Cardiologist (physician) and Advanced Practice Providers or APPs (Physician Assistants and Nurse Practitioners) who all work together to provide you with the care you need, when you need it.  Your next appointment:   12 month(s)  Provider:   Steffanie Dunn, MD or Sherie Don, NP    We recommend signing up for the patient portal called "MyChart".  Sign up information is provided on this After Visit Summary.  MyChart is used to connect with patients for Virtual Visits (Telemedicine).  Patients are able to view lab/test results, encounter notes, upcoming appointments, etc.  Non-urgent messages can be sent to your provider as well.   To learn more about what you can do with MyChart, go to [email or web address].

## 2023-10-03 ENCOUNTER — Other Ambulatory Visit: Payer: Self-pay

## 2023-10-06 ENCOUNTER — Ambulatory Visit: Attending: Cardiology | Admitting: Cardiology

## 2023-10-06 ENCOUNTER — Encounter: Payer: Self-pay | Admitting: Cardiology

## 2023-10-06 ENCOUNTER — Other Ambulatory Visit: Payer: Self-pay

## 2023-10-06 VITALS — BP 144/72 | HR 78 | Ht 66.0 in | Wt 259.0 lb

## 2023-10-06 DIAGNOSIS — I4892 Unspecified atrial flutter: Secondary | ICD-10-CM

## 2023-10-06 DIAGNOSIS — E782 Mixed hyperlipidemia: Secondary | ICD-10-CM

## 2023-10-06 NOTE — Progress Notes (Signed)
 Cardiology Office Note:    Date:  10/06/2023   ID:  Erika Anderson, DOB Oct 23, 1950, MRN [id number]  PCP:  Lemar Pyles, NP   [facility] Providers Cardiologist:  Constancia Delton, MD Electrophysiologist:  Boyce Byes, MD     Referring MD: Lemar Pyles, NP   No chief complaint on file.   History of Present Illness:    Erika Anderson is a 73 y.o. female with a hx of A-fib/atrial flutter s/p ablation 03/22/2022, hyperlipidemia, obesity, CLL who presents for follow-up.  Doing okay, denies palpitations.  Compliant with Eliquis  as prescribed, denies any bleeding issues.  Is working on eating healthier, increased activity so as to lose some weight.  Endorses eating high calorie foods.  Denies any concerns today.   Prior notes Echocardiogram 10/2021 EF 50%. Underwent a stress echo 2020 at [facility], EF normal, no inducible ischemia.  Past Medical History:  Diagnosis Date   Atrial fibrillation (HCC)    Chronic lymphocytic leukemia (HCC)    Grade II diastolic dysfunction    Hyperlipidemia    Leukemia (HCC)    Mild mitral regurgitation by prior echocardiogram    Obesity (BMI 30-39.9)    Restless legs syndrome (RLS) 12/28/2014   RLS (restless legs syndrome)     Past Surgical History:  Procedure Laterality Date   ATRIAL FIBRILLATION ABLATION N/A 03/22/2022   Procedure: ATRIAL FIBRILLATION ABLATION;  Surgeon: Boyce Byes, MD;  Location: [facility]VAS[facility];  Service: Cardiovascular;  Laterality: N/A;   CATARACT EXTRACTION W/PHACO Left 05/18/2023   Procedure: CATARACT EXTRACTION PHACO AND INTRAOCULAR LENS PLACEMENT (IOC) LEFT;  Surgeon: Annell Kidney, MD;  Location: [facility];  Service: Ophthalmology;  Laterality: Left;  6.95 0:44.8   CATARACT EXTRACTION W/PHACO Right 05/25/2023   Procedure: CATARACT EXTRACTION PHACO AND INTRAOCULAR LENS PLACEMENT (IOC) RIGHT 5.35 00:35.4;  Surgeon: Annell Kidney, MD;  Location: [facility];   Service: Ophthalmology;  Laterality: Right;   COLONOSCOPY WITH PROPOFOL  N/A 08/24/2021   Procedure: COLONOSCOPY WITH PROPOFOL ;  Surgeon: Luke Salaam, MD;  Location: [facility] ENDOSCOPY;  Service: Gastroenterology;  Laterality: N/A;   HEMORROIDECTOMY     TOTAL ABDOMINAL HYSTERECTOMY     ovaries remain    Current Medications: Current Meds  Medication Sig   apixaban  (ELIQUIS ) 5 MG TABS tablet Take 1 tablet (5 mg total) by mouth 2 (two) times daily.   betamethasone  dipropionate 0.05 % lotion Apply topically daily.   citalopram  (CELEXA ) 20 MG tablet Take 1 tablet (20 mg total) by mouth daily.   gabapentin  (NEURONTIN ) 300 MG capsule Take 2 capsules (600 mg total) by mouth at bedtime.   Iron -Vitamin C  65-125 MG TABS Take 1 tablet by mouth every other day.   ketoconazole (NIZORAL) 2 % shampoo Apply topically 3 (three) times a week.   metoprolol  tartrate (LOPRESSOR ) 25 MG tablet Take 1 tablet (25 mg total) by mouth 2 (two) times daily.   Multiple Vitamins-Minerals (MULTIVITAMIN WITH MINERALS) tablet Take 1 tablet by mouth daily.   pantoprazole  (PROTONIX ) 40 MG tablet Take 1 tablet (40 mg total) by mouth daily.   rOPINIRole  (REQUIP ) 1 MG tablet Take 1 tablet (1 mg total) by mouth 4 (four) times daily.   rosuvastatin  (CRESTOR ) 20 MG tablet Take 1 tablet (20 mg total) by mouth daily.   traZODone  (DESYREL ) 50 MG tablet Take 1 tablet (50 mg total) by mouth at bedtime as needed for sleep.   zanubrutinib  (BRUKINSA ) 80 MG capsule Take 2 capsules (160 mg total)  by mouth 2 (two) times daily.     Allergies:   Patient has no known allergies.   Social History   Socioeconomic History   Marital status: Married    Spouse name: Not on file   Number of children: Not on file   Years of education: 12   Highest education level: High school graduate  Occupational History   Occupation: retired  Tobacco Use   Smoking status: Never   Smokeless tobacco: Never   Tobacco comments:    Never smoke 04/19/22  Vaping Use    Vaping status: Never Used  Substance and Sexual Activity   Alcohol use: No    Alcohol/week: 0.0 standard drinks of alcohol   Drug use: No   Sexual activity: Not Currently  Other Topics Concern   Not on file  Social History Narrative   Helps take care of grandchildren    Social Drivers of Health   Financial Resource Strain: Low Risk  (03/08/2023)   Overall Financial Resource Strain (CARDIA)    Difficulty of Paying Living Expenses: Not hard at all  Food Insecurity: No Food Insecurity (03/08/2023)   Hunger Vital Sign    Worried About Running Out of Food in the Last Year: Never true    Ran Out of Food in the Last Year: Never true  Transportation Needs: No Transportation Needs (03/08/2023)   PRAPARE - Administrator, Civil Service (Medical): No    Lack of Transportation (Non-Medical): No  Physical Activity: Sufficiently Active (03/08/2023)   Exercise Vital Sign    Days of Exercise per Week: 7 days    Minutes of Exercise per Session: 30 min  Stress: No Stress Concern Present (03/08/2023)   [employer] of Occupational Health - Occupational Stress Questionnaire    Feeling of Stress : Not at all  Social Connections: Moderately Integrated (03/08/2023)   Social Connection and Isolation Panel [NHANES]    Frequency of Communication with Friends and Family: More than three times a week    Frequency of Social Gatherings with Friends and Family: Twice a week    Attends Religious Services: More than 4 times per year    Active Member of [employer] or Organizations: No    Attends Engineer, structural: Never    Marital Status: Married     Family History: The patient's family history includes Arthritis in her sister; Cancer in her mother and paternal grandfather; Dementia in her father; Heart disease in her brother; Stroke in her paternal grandmother. There is no history of Breast cancer.  ROS:   Please see the history of present illness.     All other systems reviewed and  are negative.  EKGs/Labs/Other Studies Reviewed:    The following studies were reviewed today:  Recent Labs: 07/19/2023: ALT 19; BUN 17; Creatinine 1.01; Hemoglobin 12.7; Platelet Count 197; Potassium 3.7; Sodium 139 08/29/2023: Magnesium 2.0; TSH 1.650  Recent Lipid Panel    Component Value Date/Time   CHOL 134 08/29/2023 1018   TRIG 100 08/29/2023 1018   HDL 54 08/29/2023 1018   LDLCALC 61 08/29/2023 1018     Risk Assessment/Calculations:          Physical Exam:    VS:  BP (!) 144/72   Pulse 78   Ht 5\' 6"  (1.676 m)   Wt 259 lb (117.5 kg)   SpO2 98%   BMI 41.80 kg/m     Wt Readings from Last 3 Encounters:  10/06/23 259 lb (117.5 kg)  09/14/23 261 lb 9.6 oz (118.7 kg)  08/29/23 264 lb 9.6 oz (120 kg)     GEN:  Well nourished, well developed in no acute distress HEENT: Normal NECK: No JVD; No carotid bruits CARDIAC: Regular, bradycardic RESPIRATORY:  Clear to auscultation without rales, wheezing or rhonchi  ABDOMEN: Soft, non-tender, non-distended MUSCULOSKELETAL:  trace edema; varicose veins noted SKIN: Warm and dry NEUROLOGIC:  Alert and oriented x 3 PSYCHIATRIC:  Normal affect   ASSESSMENT:    1. Atrial flutter, unspecified type (HCC)   2. Mixed hyperlipidemia   3. Morbid obesity (HCC)    PLAN:    In order of problems listed above:  A-fib/atrial flutter s/p RFA 03/22/2022.  Regular rhythm on exam.  Continue Lopressor  25 mg twice daily, continue Eliquis  5 mg twice daily.   Hyperlipidemia, cholesterol controlled.  Continue Crestor  20 mg daily. Obesity, low-calorie diet, weight loss advised.  Follow-up in 1 year.     Medication Adjustments/Labs and Tests Ordered: Current medicines are reviewed at length with the patient today.  Concerns regarding medicines are outlined above.  No orders of the defined types were placed in this encounter.  No orders of the defined types were placed in this encounter.   Patient Instructions  Medication  Instructions:  Your Physician recommend you continue on your current medication as directed.    *If you need a refill on your cardiac medications before your next appointment, please call your pharmacy*  Lab Work: No labs ordered today  If you have labs (blood work) drawn today and your tests are completely normal, you will receive your results only by: MyChart Message (if you have MyChart) OR A paper copy in the mail If you have any lab test that is abnormal or we need to change your treatment, we will call you to review the results.  Testing/Procedures: No test ordered today   Follow-Up: At [facility], you and your health needs are our priority.  As part of our continuing mission to provide you with exceptional heart care, our providers are all part of one team.  This team includes your primary Cardiologist (physician) and Advanced Practice Providers or APPs (Physician Assistants and Nurse Practitioners) who all work together to provide you with the care you need, when you need it.  Your next appointment:   1 year(s)  Provider:   You may see Constancia Delton, MD or one of the following Advanced Practice Providers on your designated Care Team:   Laneta Pintos, NP Gildardo Labrador, PA-C Varney Gentleman, PA-C Cadence Leona [place], PA-C Ronald Cockayne, NP Morey Ar, NP    We recommend signing up for the patient portal called "MyChart".  Sign up information is provided on this After Visit Summary.  MyChart is used to connect with patients for Virtual Visits (Telemedicine).  Patients are able to view lab/test results, encounter notes, upcoming appointments, etc.  Non-urgent messages can be sent to your provider as well.   To learn more about what you can do with MyChart, go to [email or web address].     Signed, Constancia Delton, MD  10/06/2023 10:21 AM    [facility]

## 2023-10-06 NOTE — Patient Instructions (Signed)

## 2023-10-06 NOTE — Progress Notes (Signed)
 Specialty Pharmacy Refill Coordination Note  Erika Anderson is a 72 y.o. female contacted today regarding refills of specialty medication(s) Zanubrutinib  (Brukinsa )   Patient requested (Patient-Rptd) Delivery   Delivery date: (Patient-Rptd) 10/13/23   Verified address: (Patient-Rptd) [address] [facility]. [phone]   Medication will be filled on 05.07.25.

## 2023-10-10 ENCOUNTER — Other Ambulatory Visit: Payer: Self-pay | Admitting: Nurse Practitioner

## 2023-10-10 NOTE — Telephone Encounter (Signed)
 Copied from CRM [id number]. Topic: Clinical - Medication Refill >> Oct 10, 2023  9:23 AM Leory Rands wrote: Most Recent Primary Care Visit:  Provider: CANNADY, JOLENE T  Department: [facility]  Visit Type: PHYSICAL  Date: 08/29/2023  Medication: gabapentin  (NEURONTIN ) 300 MG capsule [[id number]]  Has the patient contacted their pharmacy? Yes (Agent: If no, request that the patient contact the pharmacy for the refill. If patient does not wish to contact the pharmacy document the reason why and proceed with request.) (Agent: If yes, when and what did the pharmacy advise?)  Is this the correct pharmacy for this prescription? Yes If no, delete pharmacy and type the correct one.  This is the patient's preferred pharmacy:  [facility] [address], [place] - [address] [place] [address] Phone: [phone] Fax: [phone]  [facility] - [facility] [address] [place] [address] Phone: [phone] Fax: [phone]   Has the prescription been filled recently? Yes  Is the patient out of the medication? Yes  Has the patient been seen for an appointment in the last year OR does the patient have an upcoming appointment? Yes  Can we respond through MyChart? Yes  Agent: Please be advised that Rx refills may take up to 3 business days. We ask that you follow-up with your pharmacy.

## 2023-10-11 ENCOUNTER — Other Ambulatory Visit (HOSPITAL_BASED_OUTPATIENT_CLINIC_OR_DEPARTMENT_OTHER): Payer: Self-pay

## 2023-10-11 NOTE — Telephone Encounter (Signed)
 Called pharmacy. Pt received 180 on 08/09/2023. Pt is not able to get a refill until June 24/2025.  Called pt. Pt states that she "some times" takes 3 tablets at bedtime. Pt is out of medication.   Please advise.

## 2023-10-12 ENCOUNTER — Other Ambulatory Visit: Payer: Self-pay

## 2023-10-12 MED ORDER — GABAPENTIN 300 MG PO CAPS: 600.0000 mg | ORAL_CAPSULE | Freq: Every day | ORAL | 3 refills | Status: AC

## 2023-10-18 ENCOUNTER — Inpatient Hospital Stay: Payer: Medicare HMO | Attending: Oncology

## 2023-10-18 ENCOUNTER — Inpatient Hospital Stay: Payer: Medicare HMO | Admitting: Oncology

## 2023-10-18 NOTE — Assessment & Plan Note (Deleted)
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 Rapid lymphocyte increment, 50% over 2 months period She is on Elqiuis, will watch for possible increase risk of bleeding.  Labs are reviewed and discussed with patient. Continue Zanubrutinib '160mg'$  BID, clinically she tolerates well.

## 2023-10-20 ENCOUNTER — Encounter: Payer: Self-pay | Admitting: Nurse Practitioner

## 2023-10-20 ENCOUNTER — Telehealth (INDEPENDENT_AMBULATORY_CARE_PROVIDER_SITE_OTHER): Admitting: Nurse Practitioner

## 2023-10-20 ENCOUNTER — Telehealth: Payer: Self-pay

## 2023-10-20 DIAGNOSIS — U071 COVID-19: Secondary | ICD-10-CM | POA: Insufficient documentation

## 2023-10-20 MED ORDER — ALBUTEROL SULFATE HFA 108 (90 BASE) MCG/ACT IN AERS
2.0000 | INHALATION_SPRAY | Freq: Four times a day (QID) | RESPIRATORY_TRACT | 0 refills | Status: DC | PRN
Start: 2023-10-20 — End: 2023-11-09

## 2023-10-20 MED ORDER — PREDNISONE 20 MG PO TABS: 40.0000 mg | ORAL_TABLET | Freq: Every day | ORAL | 0 refills | Status: AC

## 2023-10-20 NOTE — Telephone Encounter (Signed)
 Called and scheduled virtual visit for the patient.

## 2023-10-20 NOTE — Patient Instructions (Signed)

## 2023-10-20 NOTE — Progress Notes (Signed)
 There were no vitals taken for this visit.   Subjective:    Patient ID: Erika Anderson, female    DOB: 11/09/1950, 73 y.o.   MRN: [id number]  HPI: Erika Anderson is a 73 y.o. female  Chief Complaint  Patient presents with   Covid Positive    Patient states she tested positive for covid on Monday and again this morning. States she has been having congestion, sinus pressure, headache, cough, and drainage. States symptoms started Monday at lunch time.   Virtual Visit via Video Note  I connected with Erika Anderson on 10/20/23 at 10:40 AM EDT by a video enabled telemedicine application and verified that I am speaking with the correct person using two identifiers.  Location: Patient: home Provider: work   I discussed the limitations of evaluation and management by telemedicine and the availability of in person appointments. The patient expressed understanding and agreed to proceed.  I discussed the assessment and treatment plan with the patient. The patient was provided an opportunity to ask questions and all were answered. The patient agreed with the plan and demonstrated an understanding of the instructions.   The patient was advised to call back or seek an in-person evaluation if the symptoms worsen or if the condition fails to improve as anticipated.  I provided 25 minutes of non-face-to-face time during this encounter.   Naleah Kofoed T Kelisha Dall, NP   COVID POSITIVE Started with symptoms early Monday, with headache and congestion.  Tested positive for Covid x 2. Fever: not anymore but initially Cough: yes Shortness of breath: yes Wheezing: yes Chest pain: no Chest tightness: no Chest congestion: no Nasal congestion: yes Runny nose: yes Post nasal drip: yes Sneezing: no Sore throat: no Swollen glands: no Sinus pressure: yes Headache: yes Face pain: yes Toothache: no Ear pain: none Ear pressure: yes bilateral Eyes red/itching:no Eye drainage/crusting: no   Vomiting: no Rash: no Fatigue: yes Sick contacts: yes did not know until it was too late Strep contacts: no  Context: stable Recurrent sinusitis: no Relief with OTC cold/cough medications: slight relief  Treatments attempted: Mucinex  DM, Vapor Cool  Relevant past medical, surgical, family and social history reviewed and updated as indicated. Interim medical history since our last visit reviewed. Allergies and medications reviewed and updated.  Review of Systems  Constitutional:  Positive for fatigue and fever (initially, no further). Negative for activity change, appetite change and chills.  HENT:  Positive for congestion, postnasal drip, rhinorrhea, sinus pressure and sinus pain. Negative for ear discharge, ear pain, facial swelling, sneezing, sore throat and voice change.   Respiratory:  Positive for cough, shortness of breath and wheezing. Negative for chest tightness.   Cardiovascular:  Negative for chest pain, palpitations and leg swelling.  Gastrointestinal: Negative.   Neurological:  Positive for headaches. Negative for dizziness and numbness.  Psychiatric/Behavioral: Negative.     Per HPI unless specifically indicated above     Objective:     There were no vitals taken for this visit.  Wt Readings from Last 3 Encounters:  10/06/23 259 lb (117.5 kg)  09/14/23 261 lb 9.6 oz (118.7 kg)  08/29/23 264 lb 9.6 oz (120 kg)    Physical Exam Vitals and nursing note reviewed.  Constitutional:      General: She is awake. She is not in acute distress.    Appearance: She is well-developed and well-groomed. She is ill-appearing. She is not toxic-appearing.  HENT:     Head: Normocephalic.  Right Ear: Hearing normal.     Left Ear: Hearing normal.  Eyes:     General: Lids are normal.        Right eye: No discharge.        Left eye: No discharge.     Conjunctiva/sclera: Conjunctivae normal.  Pulmonary:     Effort: Pulmonary effort is normal. No accessory muscle usage or  respiratory distress.  Musculoskeletal:     Cervical back: Normal range of motion.  Neurological:     Mental Status: She is alert and oriented to person, place, and time.  Psychiatric:        Attention and Perception: Attention normal.        Mood and Affect: Mood normal.        Behavior: Behavior normal. Behavior is cooperative.        Thought Content: Thought content normal.        Judgment: Judgment normal.    Results for orders placed or performed in visit on 08/29/23  Lipid Panel w/o Chol/HDL Ratio   Collection Time: 08/29/23 10:18 AM  Result Value Ref Range   Cholesterol, Total 134 100 - 199 mg/dL   Triglycerides 161 0 - 149 mg/dL   HDL 54 >09 mg/dL   VLDL Cholesterol Cal 19 5 - 40 mg/dL   LDL Chol Calc (NIH) 61 0 - 99 mg/dL  Magnesium   Collection Time: 08/29/23 10:18 AM  Result Value Ref Range   Magnesium 2.0 1.6 - 2.3 mg/dL  TSH   Collection Time: 08/29/23 10:18 AM  Result Value Ref Range   TSH 1.650 0.450 - 4.500 uIU/mL  VITAMIN D  25 Hydroxy (Vit-D Deficiency, Fractures)   Collection Time: 08/29/23 10:18 AM  Result Value Ref Range   Vit D, 25-Hydroxy 28.6 (L) 30.0 - 100.0 ng/mL      Assessment & Plan:   Problem List Items Addressed This Visit       Other   COVID-19 virus RNA test result positive at limit of detection - Primary   Acute with symptoms starting 5 days ago, Monday.  She is currently on Eliquis  and Brukinsa , both which have high interaction risks with Paxlovid.  Discussed with patient.  Will start Prednisone  40 MG daily for 5 days and Albuterol  inhaler to use as needed.  Continue at home OTC regimen.  Is aware if any worsening to immediately alert provider or go to ER.  Recommend: - Increased rest - Increasing Fluids - Acetaminophen  as needed for fever/pain.  - Salt water gargling, chloraseptic spray and throat lozenges - Mucinex .  - Humidifying the air.  Will plan on virtual visit in one week to ensure she is improving.        Follow up  plan: Return in about 1 week (around 10/27/2023) for Covid Positive -- follow-up to ensure improving.

## 2023-10-20 NOTE — Assessment & Plan Note (Signed)
 Acute with symptoms starting 5 days ago, Monday.  She is currently on Eliquis  and Brukinsa , both which have high interaction risks with Paxlovid.  Discussed with patient.  Will start Prednisone  40 MG daily for 5 days and Albuterol  inhaler to use as needed.  Continue at home OTC regimen.  Is aware if any worsening to immediately alert provider or go to ER.  Recommend: - Increased rest - Increasing Fluids - Acetaminophen  as needed for fever/pain.  - Salt water gargling, chloraseptic spray and throat lozenges - Mucinex .  - Humidifying the air.  Will plan on virtual visit in one week to ensure she is improving.

## 2023-10-20 NOTE — Telephone Encounter (Signed)
 Copied from CRM [id number]. Topic: Clinical - Medical Advice >> Oct 20, 2023  7:47 AM Lizabeth Riggs wrote: Reason for CRM:  Ammon Bales test positive for COVID this morning. She has congestion, cough, low grade fever. Alberto wants to see if her NP would call her in medication to [facility] on [address]. Please send her a message through MyChart. Thanks

## 2023-10-24 NOTE — Progress Notes (Signed)
 Appt scheduled

## 2023-10-25 ENCOUNTER — Encounter (INDEPENDENT_AMBULATORY_CARE_PROVIDER_SITE_OTHER): Payer: Self-pay

## 2023-10-26 ENCOUNTER — Encounter: Payer: Self-pay | Admitting: Nurse Practitioner

## 2023-10-26 ENCOUNTER — Ambulatory Visit (INDEPENDENT_AMBULATORY_CARE_PROVIDER_SITE_OTHER): Admitting: Nurse Practitioner

## 2023-10-26 VITALS — BP 155/75 | HR 68 | Temp 97.9°F | Ht 66.0 in | Wt 257.6 lb

## 2023-10-26 DIAGNOSIS — U071 COVID-19: Secondary | ICD-10-CM | POA: Diagnosis not present

## 2023-10-26 NOTE — Assessment & Plan Note (Signed)
 Acute and improved at this time.  Continue to ensure good fluid intake and plenty of rest.

## 2023-10-26 NOTE — Progress Notes (Signed)
 BP (!) 155/75   Pulse 68   Temp 97.9 F (36.6 C) (Oral)   Ht 5\' 6"  (1.676 m)   Wt 257 lb 9.6 oz (116.8 kg)   SpO2 97%   BMI 41.58 kg/m    Subjective:    Patient ID: Erika Anderson, female    DOB: 21-Jun-1950, 73 y.o.   MRN: [id number]  HPI: Erika Anderson is a 73 y.o. female  Chief Complaint  Patient presents with   Covid Positive   COVID POSITIVE Follow-up today for Covid which was diagnosed 10/20/23.  She was given Prednisone  40 MG daily for 5 days, no Paxlovid due to interaction risk with her oncology medication. Is feeling better.  Did affect her taste and smell a little bit. Fever: no Cough: a little bit but better Shortness of breath: no Wheezing: no Chest pain: no Chest tightness: no Chest congestion: no Nasal congestion: no Runny nose: no Post nasal drip: no Sneezing: no Sore throat: no Swollen glands: no Sinus pressure: no Headache: no Face pain: no Toothache: no Ear pain: none Ear pressure: none Eyes red/itching:no Eye drainage/crusting: no  Vomiting: no Rash: no Fatigue: yes Context: better Recurrent sinusitis: no Relief with OTC cold/cough medications: yes  Treatments attempted: cold/sinus and Prednisone     Relevant past medical, surgical, family and social history reviewed and updated as indicated. Interim medical history since our last visit reviewed. Allergies and medications reviewed and updated.  Review of Systems  Constitutional:  Negative for activity change, appetite change, diaphoresis, fatigue and fever.  Respiratory:  Negative for cough, chest tightness, shortness of breath and wheezing.   Cardiovascular:  Negative for chest pain, palpitations and leg swelling.  Gastrointestinal: Negative.   Neurological: Negative.   Psychiatric/Behavioral: Negative.      Per HPI unless specifically indicated above     Objective:     BP (!) 155/75   Pulse 68   Temp 97.9 F (36.6 C) (Oral)   Ht 5\' 6"  (1.676 m)   Wt 257 lb 9.6 oz  (116.8 kg)   SpO2 97%   BMI 41.58 kg/m   Wt Readings from Last 3 Encounters:  10/26/23 257 lb 9.6 oz (116.8 kg)  10/06/23 259 lb (117.5 kg)  09/14/23 261 lb 9.6 oz (118.7 kg)    Physical Exam Vitals and nursing note reviewed.  Constitutional:      General: She is awake. She is not in acute distress.    Appearance: She is well-developed and well-groomed. She is obese. She is not ill-appearing or toxic-appearing.  HENT:     Head: Normocephalic.     Right Ear: Hearing and external ear normal.     Left Ear: Hearing and external ear normal.  Eyes:     General: Lids are normal.        Right eye: No discharge.        Left eye: No discharge.     Conjunctiva/sclera: Conjunctivae normal.     Pupils: Pupils are equal, round, and reactive to light.  Neck:     Thyroid : No thyromegaly.     Vascular: No carotid bruit.  Cardiovascular:     Rate and Rhythm: Normal rate and regular rhythm.     Heart sounds: Normal heart sounds. No murmur heard.    No gallop.  Pulmonary:     Effort: Pulmonary effort is normal. No accessory muscle usage or respiratory distress.     Breath sounds: Normal breath sounds. No decreased breath sounds, wheezing or  rales.  Abdominal:     General: Bowel sounds are normal. There is no distension.     Palpations: Abdomen is soft.     Tenderness: There is no abdominal tenderness.  Musculoskeletal:     Cervical back: Normal range of motion and neck supple.     Right lower leg: No edema.     Left lower leg: No edema.  Lymphadenopathy:     Cervical: No cervical adenopathy.  Skin:    General: Skin is warm and dry.  Neurological:     Mental Status: She is alert and oriented to person, place, and time.     Deep Tendon Reflexes: Reflexes are normal and symmetric.     Reflex Scores:      Brachioradialis reflexes are 2+ on the right side and 2+ on the left side.      Patellar reflexes are 2+ on the right side and 2+ on the left side. Psychiatric:        Attention and  Perception: Attention normal.        Mood and Affect: Mood normal.        Speech: Speech normal.        Behavior: Behavior normal. Behavior is cooperative.        Thought Content: Thought content normal.    Results for orders placed or performed in visit on 08/29/23  Lipid Panel w/o Chol/HDL Ratio   Collection Time: 08/29/23 10:18 AM  Result Value Ref Range   Cholesterol, Total 134 100 - 199 mg/dL   Triglycerides 829 0 - 149 mg/dL   HDL 54 >56 mg/dL   VLDL Cholesterol Cal 19 5 - 40 mg/dL   LDL Chol Calc (NIH) 61 0 - 99 mg/dL  Magnesium   Collection Time: 08/29/23 10:18 AM  Result Value Ref Range   Magnesium 2.0 1.6 - 2.3 mg/dL  TSH   Collection Time: 08/29/23 10:18 AM  Result Value Ref Range   TSH 1.650 0.450 - 4.500 uIU/mL  VITAMIN D  25 Hydroxy (Vit-D Deficiency, Fractures)   Collection Time: 08/29/23 10:18 AM  Result Value Ref Range   Vit D, 25-Hydroxy 28.6 (L) 30.0 - 100.0 ng/mL      Assessment & Plan:   Problem List Items Addressed This Visit       Other   COVID-19 virus RNA test result positive at limit of detection - Primary   Acute and improved at this time.  Continue to ensure good fluid intake and plenty of rest.        Follow up plan: Return for as scheduled September.

## 2023-10-26 NOTE — Patient Instructions (Signed)

## 2023-11-04 ENCOUNTER — Other Ambulatory Visit: Payer: Self-pay

## 2023-11-07 ENCOUNTER — Other Ambulatory Visit: Payer: Self-pay

## 2023-11-07 NOTE — Progress Notes (Signed)
 Specialty Pharmacy Refill Coordination Note  Erika Anderson is a 73 y.o. female contacted today regarding refills of specialty medication(s) Zanubrutinib  (Brukinsa )   Patient requested Delivery   Delivery date: 11/10/23   Verified address: [address] [place] [address]   Medication will be filled on 11/09/23.

## 2023-11-08 ENCOUNTER — Inpatient Hospital Stay: Admitting: Oncology

## 2023-11-08 ENCOUNTER — Inpatient Hospital Stay: Attending: Oncology

## 2023-11-08 ENCOUNTER — Encounter: Payer: Self-pay | Admitting: Oncology

## 2023-11-08 ENCOUNTER — Other Ambulatory Visit: Payer: Self-pay | Admitting: Nurse Practitioner

## 2023-11-08 VITALS — BP 158/66 | HR 59 | Temp 96.1°F | Resp 18 | Wt 262.3 lb

## 2023-11-08 DIAGNOSIS — E669 Obesity, unspecified: Secondary | ICD-10-CM | POA: Diagnosis not present

## 2023-11-08 DIAGNOSIS — Z801 Family history of malignant neoplasm of trachea, bronchus and lung: Secondary | ICD-10-CM | POA: Insufficient documentation

## 2023-11-08 DIAGNOSIS — Z823 Family history of stroke: Secondary | ICD-10-CM | POA: Diagnosis not present

## 2023-11-08 DIAGNOSIS — Z5111 Encounter for antineoplastic chemotherapy: Secondary | ICD-10-CM | POA: Diagnosis not present

## 2023-11-08 DIAGNOSIS — I4891 Unspecified atrial fibrillation: Secondary | ICD-10-CM | POA: Insufficient documentation

## 2023-11-08 DIAGNOSIS — C9110 Chronic lymphocytic leukemia of B-cell type not having achieved remission: Secondary | ICD-10-CM | POA: Diagnosis not present

## 2023-11-08 DIAGNOSIS — Z8249 Family history of ischemic heart disease and other diseases of the circulatory system: Secondary | ICD-10-CM | POA: Diagnosis not present

## 2023-11-08 DIAGNOSIS — Z79899 Other long term (current) drug therapy: Secondary | ICD-10-CM | POA: Diagnosis not present

## 2023-11-08 DIAGNOSIS — Z818 Family history of other mental and behavioral disorders: Secondary | ICD-10-CM | POA: Insufficient documentation

## 2023-11-08 DIAGNOSIS — Z90710 Acquired absence of both cervix and uterus: Secondary | ICD-10-CM | POA: Insufficient documentation

## 2023-11-08 DIAGNOSIS — Z8041 Family history of malignant neoplasm of ovary: Secondary | ICD-10-CM | POA: Diagnosis not present

## 2023-11-08 DIAGNOSIS — E785 Hyperlipidemia, unspecified: Secondary | ICD-10-CM | POA: Diagnosis not present

## 2023-11-08 DIAGNOSIS — Z8261 Family history of arthritis: Secondary | ICD-10-CM | POA: Diagnosis not present

## 2023-11-08 DIAGNOSIS — G2581 Restless legs syndrome: Secondary | ICD-10-CM | POA: Diagnosis not present

## 2023-11-08 DIAGNOSIS — G479 Sleep disorder, unspecified: Secondary | ICD-10-CM | POA: Insufficient documentation

## 2023-11-08 LAB — CBC WITH DIFFERENTIAL (CANCER CENTER ONLY)
Abs Immature Granulocytes: 0.02 10*3/uL (ref 0.00–0.07)
Basophils Absolute: 0.1 10*3/uL (ref 0.0–0.1)
Basophils Relative: 1 %
Eosinophils Absolute: 0.2 10*3/uL (ref 0.0–0.5)
Eosinophils Relative: 3 %
HCT: 37.7 % (ref 36.0–46.0)
Hemoglobin: 12.3 g/dL (ref 12.0–15.0)
Immature Granulocytes: 0 %
Lymphocytes Relative: 31 %
Lymphs Abs: 2.1 10*3/uL (ref 0.7–4.0)
MCH: 29.5 pg (ref 26.0–34.0)
MCHC: 32.6 g/dL (ref 30.0–36.0)
MCV: 90.4 fL (ref 80.0–100.0)
Monocytes Absolute: 0.7 10*3/uL (ref 0.1–1.0)
Monocytes Relative: 10 %
Neutro Abs: 3.7 10*3/uL (ref 1.7–7.7)
Neutrophils Relative %: 55 %
Platelet Count: 182 10*3/uL (ref 150–400)
RBC: 4.17 MIL/uL (ref 3.87–5.11)
RDW: 13.4 % (ref 11.5–15.5)
WBC Count: 6.7 10*3/uL (ref 4.0–10.5)
nRBC: 0 % (ref 0.0–0.2)

## 2023-11-08 LAB — FERRITIN: Ferritin: 63 ng/mL (ref 11–307)

## 2023-11-08 LAB — CMP (CANCER CENTER ONLY)
ALT: 23 U/L (ref 0–44)
AST: 24 U/L (ref 15–41)
Albumin: 3.8 g/dL (ref 3.5–5.0)
Alkaline Phosphatase: 61 U/L (ref 38–126)
Anion gap: 8 (ref 5–15)
BUN: 15 mg/dL (ref 8–23)
CO2: 27 mmol/L (ref 22–32)
Calcium: 8.6 mg/dL — ABNORMAL LOW (ref 8.9–10.3)
Chloride: 107 mmol/L (ref 98–111)
Creatinine: 0.87 mg/dL (ref 0.44–1.00)
GFR, Estimated: >60 mL/min (ref >60)
Glucose, Bld: 96 mg/dL (ref 70–99)
Potassium: 4.6 mmol/L (ref 3.5–5.1)
Sodium: 142 mmol/L (ref 135–145)
Total Bilirubin: 0.6 mg/dL (ref 0.0–1.2)
Total Protein: 6.6 g/dL (ref 6.5–8.1)

## 2023-11-08 LAB — IRON AND TIBC
Iron: 95 ug/dL (ref 28–170)
Saturation Ratios: 31 % (ref 10.4–31.8)
TIBC: 302 ug/dL (ref 250–450)
UIBC: 207 ug/dL

## 2023-11-08 LAB — LACTATE DEHYDROGENASE: LDH: 148 U/L (ref 98–192)

## 2023-11-08 NOTE — Assessment & Plan Note (Signed)
 Lab Results  Component Value Date   HGB 12.3 11/08/2023   TIBC 302 11/08/2023   IRONPCTSAT 31 11/08/2023   FERRITIN 63 11/08/2023    Continue iron  supplementation Vitron C every other day to further increase ferritin, goal is > 75 Continue to monitor.

## 2023-11-08 NOTE — Progress Notes (Signed)
 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 Rapid lymphocyte increment, 50% over 2 months period She is on Elqiuis, will watch for possible increase risk of bleeding.  Labs are reviewed and discussed with patient. Continue Zanubrutinib  160mg  BID, clinically she tolerates well.    Encounter for antineoplastic chemotherapy Continue Zanubrutinib  160mg  BID  RLS (restless legs syndrome) Lab Results  Component Value Date   HGB 12.3 11/08/2023   TIBC 302 11/08/2023   IRONPCTSAT 31 11/08/2023   FERRITIN 63 11/08/2023    Continue iron  supplementation Vitron C every other day to further increase ferritin, goal is > 75 Continue to monitor.    Follow up 3 months  Timmy Forbes, MD, PhD [facility] Hematology Oncology 11/08/2023       HISTORY OF PRESENTING ILLNESS:  Erika Anderson [facility] 73 y.o. female presents to follow up with CLL. She was found to have abnormal CBC on 03/01/22, total white count of 15, predominantly lymphocytosis. This is chronic since Feb 2023. Peripheral blood smear + smudge cells.  + Restless leg syndrome, sleep disturbance + Family history of ovarian cancer -mother.    Oncology History  CLL (chronic lymphocytic leukemia) (HCC)  03/02/2022 Initial Diagnosis   CLL (chronic lymphocytic leukemia)  -03/15/22 Flowcytometry showed chronic lymphocytic leukemia, B cell, CD38 expression indeterminate for  prognosis (23%) and CD20 positive.  45% nuclei positive for TP53 gene deletion.    03/26/2022 Imaging   US  abdomen 1. No sonographic etiology for night sweats identified. 2. Spleen is at the upper limits of normal in size    04/01/2022 Cancer Staging   Staging form: Chronic Lymphocytic Leukemia / Small Lymphocytic Lymphoma, AJCC 8th Edition - Clinical: Modified Rai Stage 0 (Modified Rai risk: Low, Lymphocytosis: Present, Adenopathy: Absent, Organomegaly: Absent, Anemia: Absent, Thrombocytopenia: Absent) - Signed by Timmy Forbes, MD on 04/01/2022 Stage prefix: Initial diagnosis Absolute lymphocyte count (ALC) (cells/uL): 18   04/19/2022 -  Chemotherapy   Started on Zanubrutinib  160mg  BID      She reports tolerating Zanubrutinib  160mg  BID well. Night sweat has resolved  Appetite is fair.she takes Vitron C every other day    MEDICAL HISTORY:  Past Medical History:  Diagnosis Date   Atrial fibrillation (HCC)    Chronic lymphocytic leukemia (HCC)    Grade II diastolic dysfunction    Hyperlipidemia    Leukemia (HCC)    Mild mitral regurgitation by prior echocardiogram    Obesity (BMI 30-39.9)    Restless legs syndrome (RLS) 12/28/2014   RLS (restless legs syndrome)     SURGICAL HISTORY: Past Surgical History:  Procedure Laterality Date   ATRIAL FIBRILLATION ABLATION N/A 03/22/2022   Procedure: ATRIAL FIBRILLATION ABLATION;  Surgeon: Boyce Byes, MD;  Location: [facility] INVASIVE CV LAB;  Service: Cardiovascular;  Laterality: N/A;   CATARACT EXTRACTION W/PHACO Left 05/18/2023   Procedure: CATARACT EXTRACTION PHACO AND INTRAOCULAR LENS PLACEMENT (IOC) LEFT;  Surgeon: Annell Kidney, MD;  Location: [facility] SUR[facility]ERY [facility];  Service: Ophthalmology;  Laterality: Left;  6.95 0:44.8   CATARACT EXTRACTION W/PHACO Right  05/25/2023   Procedure: CATARACT EXTRACTION PHACO AND INTRAOCULAR LENS PLACEMENT (IOC) RIGHT 5.35 00:35.4;  Surgeon: Annell Kidney, MD;  Location: [facility] SUR[facility]  [facility];  Service: Ophthalmology;  Laterality: Right;   COLONOSCOPY WITH PROPOFOL  N/A 08/24/2021   Procedure: COLONOSCOPY WITH PROPOFOL ;  Surgeon: Luke Salaam, MD;  Location: [facility] ENDOSCOPY;  Service: Gastroenterology;  Laterality: N/A;   HEMORROIDECTOMY     TOTAL ABDOMINAL HYSTERECTOMY     ovaries remain    SOCIAL HISTORY: Social History   Socioeconomic History   Marital status: Married    Spouse name: Not on file   Number of children: Not on file   Years of education: 12   Highest education level: High school graduate  Occupational History   Occupation: retired  Tobacco Use   Smoking status: Never   Smokeless tobacco: Never   Tobacco comments:    Never smoke 04/19/22  Vaping Use   Vaping status: Never Used  Substance and Sexual Activity   Alcohol use: No    Alcohol/week: 0.0 standard drinks of alcohol   Drug use: No   Sexual activity: Not Currently  Other Topics Concern   Not on file  Social History Narrative   Helps take care of grandchildren    Social Drivers of Health   Financial Resource Strain: Low Risk  (03/08/2023)   Overall Financial Resource Strain (CARDIA)    Difficulty of Paying Living Expenses: Not hard at all  Food Insecurity: No Food Insecurity (03/08/2023)   Hunger Vital Sign    Worried About Running Out of Food in the Last Year: Never true    Ran Out of Food in the Last Year: Never true  Transportation Needs: No Transportation Needs (03/08/2023)   PRAPARE - Administrator, Civil Service (Medical): No    Lack of Transportation (Non-Medical): No  Physical Activity: Sufficiently Active (03/08/2023)   Exercise Vital Sign    Days of Exercise per Week: 7 days    Minutes of Exercise per Session: 30 min  Stress: No Stress Concern Present (03/08/2023)   [employer] of Occupational Health - Occupational Stress Questionnaire    Feeling of Stress : Not at all  Social Connections: Moderately Integrated (03/08/2023)   Social Connection and Isolation  Panel [NHANES]    Frequency of Communication with Friends and Family: More than three times a week    Frequency of Social Gatherings with Friends and Family: Twice a week    Attends Religious Services: More than 4 times per year    Active Member of [employer] or Organizations: No    Attends Banker Meetings: Never    Marital Status: Married  Catering manager Violence: Not At Risk (03/08/2023)   Humiliation, Afraid, Rape, and Kick questionnaire    Fear of Current or Ex-Partner: No    Emotionally Abused: No    Physically Abused: No    Sexually Abused: No    FAMILY HISTORY: Family History  Problem Relation Age of Onset   Cancer Mother        ovarian   Dementia Father    Arthritis Sister    Heart disease Brother        MI   Stroke Paternal Grandmother    Cancer Paternal Grandfather        lung   Breast cancer Neg Hx     ALLERGIES:  has no known allergies.  MEDICATIONS:  Current Outpatient Medications  Medication Sig Dispense Refill   albuterol  (VENTOLIN  HFA) 108 (90 Base)  MCG/ACT inhaler Inhale 2 puffs into the lungs every 6 (six) hours as needed for wheezing or shortness of breath. 8 g 0   apixaban  (ELIQUIS ) 5 MG TABS tablet Take 1 tablet (5 mg total) by mouth 2 (two) times daily. 180 tablet 3   betamethasone  dipropionate 0.05 % lotion Apply topically daily.     citalopram  (CELEXA ) 20 MG tablet Take 1 tablet (20 mg total) by mouth daily. 90 tablet 3   gabapentin  (NEURONTIN ) 300 MG capsule Take 2 capsules (600 mg total) by mouth at bedtime. 180 capsule 3   Iron -Vitamin C  65-125 MG TABS Take 1 tablet by mouth every other day. 30 tablet 1   ketoconazole (NIZORAL) 2 % shampoo Apply topically 3 (three) times a week.     metoprolol  tartrate (LOPRESSOR ) 25 MG tablet Take 1 tablet (25 mg total) by mouth 2 (two) times daily. 180 tablet 4   Multiple Vitamins-Minerals (MULTIVITAMIN WITH MINERALS) tablet Take 1 tablet by mouth daily.     pantoprazole  (PROTONIX ) 40 MG tablet Take 1  tablet (40 mg total) by mouth daily. 90 tablet 3   rOPINIRole  (REQUIP ) 1 MG tablet Take 1 tablet (1 mg total) by mouth 4 (four) times daily. 360 tablet 4   rosuvastatin  (CRESTOR ) 20 MG tablet Take 1 tablet (20 mg total) by mouth daily. 90 tablet 4   traZODone  (DESYREL ) 50 MG tablet Take 1 tablet (50 mg total) by mouth at bedtime as needed for sleep. 90 tablet 4   zanubrutinib  (BRUKINSA ) 80 MG capsule Take 2 capsules (160 mg total) by mouth 2 (two) times daily. 120 capsule 3   No current facility-administered medications for this visit.    Review of Systems  Constitutional:  Negative for appetite change, chills, fatigue and fever.  HENT:   Negative for hearing loss and voice change.   Eyes:  Negative for eye problems.  Respiratory:  Negative for chest tightness and cough.   Cardiovascular:  Negative for chest pain.  Gastrointestinal:  Negative for abdominal distention, abdominal pain and blood in stool.  Endocrine: Negative for hot flashes.       Night sweat  Genitourinary:  Negative for difficulty urinating and frequency.   Musculoskeletal:  Negative for arthralgias.  Skin:  Negative for itching and rash.  Neurological:  Negative for extremity weakness.  Hematological:  Negative for adenopathy.  Psychiatric/Behavioral:  Positive for sleep disturbance. Negative for confusion.      PHYSICAL EXAMINATION: ECOG PERFORMANCE STATUS: 1 - Symptomatic but completely ambulatory  Vitals:   11/08/23 1025  BP: (!) 158/66  Pulse: (!) 59  Resp: 18  Temp: (!) 96.1 F (35.6 C)  SpO2: 98%   Filed Weights   11/08/23 1025  Weight: 262 lb 4.8 oz (119 kg)    Physical Exam HENT:     Head: Normocephalic.     Nose: Nose normal.     Mouth/Throat:     Pharynx: No oropharyngeal exudate.  Eyes:     General: No scleral icterus.    Pupils: Pupils are equal, round, and reactive to light.  Cardiovascular:     Rate and Rhythm: Normal rate.  Pulmonary:     Effort: Pulmonary effort is normal. No  respiratory distress.     Breath sounds: No wheezing.  Abdominal:     General: There is no distension.     Palpations: Abdomen is soft.  Musculoskeletal:        General: Normal range of motion.     Cervical back: Normal  range of motion.  Skin:    General: Skin is warm and dry.     Findings: No erythema.  Neurological:     Mental Status: She is alert and oriented to person, place, and time. Mental status is at baseline.     Cranial Nerves: No cranial nerve deficit.     Motor: No abnormal muscle tone.  Psychiatric:        Mood and Affect: Affect normal.     LABORATORY DATA:  I have reviewed the data as listed    Latest Ref Rng & Units 11/08/2023   10:13 AM 07/19/2023   10:02 AM 06/16/2023   10:24 AM  CBC  WBC 4.0 - 10.5 K/uL 6.7  7.0  8.1   Hemoglobin 12.0 - 15.0 g/dL 38.1  82.9  93.7   Hematocrit 36.0 - 46.0 % 37.7  39.1  39.6   Platelets 150 - 400 K/uL 182  197  196       Latest Ref Rng & Units 11/08/2023   10:13 AM 07/19/2023   10:02 AM 04/15/2023    9:44 AM  CMP  Glucose 70 - 99 mg/dL 96  94  99   BUN 8 - 23 mg/dL 15  17  23    Creatinine 0.44 - 1.00 mg/dL 1.69  6.78  9.38   Sodium 135 - 145 mmol/L 142  139  138   Potassium 3.5 - 5.1 mmol/L 4.6  3.7  4.9   Chloride 98 - 111 mmol/L 107  104  104   CO2 22 - 32 mmol/L 27  28  29    Calcium  8.9 - 10.3 mg/dL 8.6  8.9  9.2   Total Protein 6.5 - 8.1 g/dL 6.6  6.9  7.0   Total Bilirubin 0.0 - 1.2 mg/dL 0.6  0.7  0.8   Alkaline Phos 38 - 126 U/L 61  64  67   AST 15 - 41 U/L 24  21  19    ALT 0 - 44 U/L 23  19  17     Lab Results  Component Value Date   LDH 148 11/08/2023   LDH 155 07/19/2023   LDH 145 04/15/2023    RADIOGRAPHIC STUDIES: I have personally reviewed the radiological images as listed and agreed with the findings in the report. No results found.

## 2023-11-08 NOTE — Assessment & Plan Note (Signed)
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 Rapid lymphocyte increment, 50% over 2 months period She is on Elqiuis, will watch for possible increase risk of bleeding.  Labs are reviewed and discussed with patient. Continue Zanubrutinib '160mg'$  BID, clinically she tolerates well.

## 2023-11-08 NOTE — Assessment & Plan Note (Signed)
 Continue Zanubrutinib 160mg  BID

## 2023-11-09 ENCOUNTER — Other Ambulatory Visit: Payer: Self-pay

## 2023-11-09 NOTE — Telephone Encounter (Signed)
 Requested medication (s) are due for refill today -unsure  Requested medication (s) are on the active medication list -yes  Future visit scheduled -no  Last refill: 10/20/23  Notes to clinic: acute visit Rx- no RF given- sent for PCP review   Requested Prescriptions  Pending Prescriptions Disp Refills   albuterol  (VENTOLIN  HFA) 108 (90 Base) MCG/ACT inhaler [Pharmacy Med Name: Albuterol  Sulfate HFA 108 (90 Base) MCG/ACT Inhalation Aerosol Solution] 9 g 0    Sig: INHALE 2 PUFFS BY MOUTH EVERY 6 HOURS AS NEEDED FOR WHEEZING FOR SHORTNESS OF BREATH     Pulmonology:  Beta Agonists 2 Failed - 11/09/2023 12:49 PM      Failed - Last BP in normal range    BP Readings from Last 1 Encounters:  11/08/23 (!) 158/66         Passed - Last Heart Rate in normal range    Pulse Readings from Last 1 Encounters:  11/08/23 (!) 59         Passed - Valid encounter within last 12 months    Recent Outpatient Visits           2 weeks ago COVID-19 virus RNA test result positive at limit of detection   [place] [place], [place] T, NP   2 weeks ago COVID-19 virus RNA test result positive at limit of detection   [place] [facility] [place], Sanjuan Crumbly T, NP   2 months ago CLL (chronic lymphocytic leukemia) (HCC)   [place] [facility] [place], Sanjuan Crumbly T, NP                 Requested Prescriptions  Pending Prescriptions Disp Refills   albuterol  (VENTOLIN  HFA) 108 (90 Base) MCG/ACT inhaler [Pharmacy Med Name: Albuterol  Sulfate HFA 108 (90 Base) MCG/ACT Inhalation Aerosol Solution] 9 g 0    Sig: INHALE 2 PUFFS BY MOUTH EVERY 6 HOURS AS NEEDED FOR WHEEZING FOR SHORTNESS OF BREATH     Pulmonology:  Beta Agonists 2 Failed - 11/09/2023 12:49 PM      Failed - Last BP in normal range    BP Readings from Last 1 Encounters:  11/08/23 (!) 158/66         Passed - Last Heart Rate in normal range    Pulse Readings from Last 1 Encounters:  11/08/23 (!)  59         Passed - Valid encounter within last 12 months    Recent Outpatient Visits           2 weeks ago COVID-19 virus RNA test result positive at limit of detection   [place] [facility] [place], [place] T, NP   2 weeks ago COVID-19 virus RNA test result positive at limit of detection   [place] [facility] [place], Sanjuan Crumbly T, NP   2 months ago CLL (chronic lymphocytic leukemia) (HCC)   [place] [facility] [place], Lavelle Posey, NP

## 2023-12-05 ENCOUNTER — Other Ambulatory Visit (HOSPITAL_COMMUNITY): Payer: Self-pay

## 2023-12-05 ENCOUNTER — Encounter (INDEPENDENT_AMBULATORY_CARE_PROVIDER_SITE_OTHER): Payer: Self-pay

## 2023-12-05 ENCOUNTER — Other Ambulatory Visit: Payer: Self-pay | Admitting: Oncology

## 2023-12-05 ENCOUNTER — Other Ambulatory Visit: Payer: Self-pay

## 2023-12-05 DIAGNOSIS — C9110 Chronic lymphocytic leukemia of B-cell type not having achieved remission: Secondary | ICD-10-CM

## 2023-12-05 NOTE — Progress Notes (Signed)
 Specialty Pharmacy Refill Coordination Note  Erika Anderson is a 73 y.o. female contacted today regarding refills of specialty medication(s) Zanubrutinib  (Brukinsa )   Patient requested Delivery   Delivery date: 12/07/23   Verified address: [address] [place] [facility]. [phone]   Medication will be filled on 12/06/23. This fill date is pending response to refill request from provider. Patient is aware and if they have not received fill by intended date they must follow up with pharmacy.

## 2023-12-06 ENCOUNTER — Other Ambulatory Visit: Payer: Self-pay

## 2023-12-06 MED ORDER — BRUKINSA 80 MG PO CAPS
160.0000 mg | ORAL_CAPSULE | Freq: Two times a day (BID) | ORAL | 3 refills | Status: DC
Start: 2023-12-06 — End: 2024-02-08
  Filled 2023-12-06: qty 120, 30d supply, fill #0
  Filled 2023-12-26 – 2023-12-29 (×2): qty 120, 30d supply, fill #1
  Filled 2024-01-24: qty 120, 30d supply, fill #2

## 2023-12-08 DIAGNOSIS — Z6841 Body Mass Index (BMI) 40.0 and over, adult: Secondary | ICD-10-CM | POA: Diagnosis not present

## 2023-12-08 DIAGNOSIS — M170 Bilateral primary osteoarthritis of knee: Secondary | ICD-10-CM | POA: Diagnosis not present

## 2023-12-19 DIAGNOSIS — Z961 Presence of intraocular lens: Secondary | ICD-10-CM | POA: Diagnosis not present

## 2023-12-19 DIAGNOSIS — H43813 Vitreous degeneration, bilateral: Secondary | ICD-10-CM | POA: Diagnosis not present

## 2023-12-19 DIAGNOSIS — H353131 Nonexudative age-related macular degeneration, bilateral, early dry stage: Secondary | ICD-10-CM | POA: Diagnosis not present

## 2023-12-19 DIAGNOSIS — H04123 Dry eye syndrome of bilateral lacrimal glands: Secondary | ICD-10-CM | POA: Diagnosis not present

## 2023-12-26 ENCOUNTER — Other Ambulatory Visit: Payer: Self-pay

## 2023-12-29 ENCOUNTER — Other Ambulatory Visit: Payer: Self-pay | Admitting: Pharmacy Technician

## 2023-12-29 ENCOUNTER — Other Ambulatory Visit: Payer: Self-pay

## 2023-12-29 NOTE — Progress Notes (Signed)
 Specialty Pharmacy Refill Coordination Note  Erika Anderson is a 73 y.o. female contacted today regarding refills of specialty medication(s) Zanubrutinib  (Brukinsa )   Patient requested Delivery   Delivery date: 01/03/24   Verified address: [address] [place], [place] [address]   Medication will be filled on 01/02/24.

## 2024-01-02 ENCOUNTER — Other Ambulatory Visit: Payer: Self-pay

## 2024-01-06 DIAGNOSIS — M170 Bilateral primary osteoarthritis of knee: Secondary | ICD-10-CM | POA: Diagnosis not present

## 2024-01-16 ENCOUNTER — Other Ambulatory Visit: Payer: Self-pay

## 2024-01-16 NOTE — Progress Notes (Signed)
 Specialty Pharmacy Ongoing Clinical Assessment Note  Erika Anderson is a 73 y.o. female who is being followed by the specialty pharmacy service for RxSp Oncology   Patient's specialty medication(s) reviewed today: Zanubrutinib  (Brukinsa )   Missed doses in the last 4 weeks: 0   Patient/Caregiver did not have any additional questions or concerns.   Therapeutic benefit summary: Patient is achieving benefit   Adverse events/side effects summary: Experienced adverse events/side effects (small red spots that show up and resolve on their own, not bothersome at this time. Advised pt to call office if they become painful, itchy, or ooze.)   Patient's therapy is appropriate to: Continue    Goals Addressed             This Visit's Progress    Achieve or maintain remission   On track    Patient is on track. Patient will maintain adherence         Follow up: 6 months  [facility] Specialty Pharmacist

## 2024-01-23 ENCOUNTER — Encounter (INDEPENDENT_AMBULATORY_CARE_PROVIDER_SITE_OTHER): Payer: Self-pay

## 2024-01-24 ENCOUNTER — Other Ambulatory Visit: Payer: Self-pay

## 2024-01-24 NOTE — Progress Notes (Signed)
 Specialty Pharmacy Refill Coordination Note  Erika Anderson is a 73 y.o. female contacted today regarding refills of specialty medication(s) Zanubrutinib  (Brukinsa )   Patient requested (Patient-Rptd) Delivery   Delivery date: 02/10/24   Verified address: (Patient-Rptd) [address] [place]. [place] [address]   Medication will be filled on 09.04.25.

## 2024-01-26 DIAGNOSIS — M170 Bilateral primary osteoarthritis of knee: Secondary | ICD-10-CM | POA: Diagnosis not present

## 2024-02-03 ENCOUNTER — Telehealth: Payer: Self-pay | Admitting: Cardiology

## 2024-02-03 NOTE — Telephone Encounter (Signed)
 HOLD Eliquis 3 days

## 2024-02-03 NOTE — Telephone Encounter (Signed)
 On review of patient's medications, she is on Eliquis  for PAF.  Please confirm with surgeon need to hold this ASAP as she needs pharmacy to have time to weigh in prior to 02/21/2024 procedure date. Thank you.  KL

## 2024-02-03 NOTE — Telephone Encounter (Signed)
 Tried calling requesting office, NA, have left several messages.

## 2024-02-03 NOTE — Telephone Encounter (Signed)
 Pharmacy please advise on holding Eliquis  prior to Right Total Knee Arthroplasty  scheduled for 02/21/2024. Last labs (CBC and CMP) completed on 11/08/2023. Thank you.

## 2024-02-03 NOTE — Telephone Encounter (Signed)
   Pre-operative Risk Assessment    Patient Name: Erika Anderson [facility]  DOB: 20-Sep-1950 MRN: [id number]   Date of last office visit: 10/06/23 Date of next office visit: n/a   Request for Surgical Clearance    Procedure:  Right Total Knee Arthroplasty  Date of Surgery:  Clearance 02/21/24                                Surgeon:  Dr Marchia Surgeon's Group or Practice Name:  [facility] Phone number:  [phone] Fax number:  [phone]   Type of Clearance Requested:   - Medical    Type of Anesthesia:  Not Indicated   Additional requests/questions:    Bonney Rosina Stamps   02/03/2024, 1:47 PM

## 2024-02-06 ENCOUNTER — Other Ambulatory Visit: Payer: Self-pay | Admitting: Nurse Practitioner

## 2024-02-07 ENCOUNTER — Telehealth: Payer: Self-pay

## 2024-02-07 NOTE — Telephone Encounter (Signed)
 Patient has been scheduled for televisit and consent done     Patient Consent for Virtual Visit         Erika Anderson has provided verbal consent on 02/07/2024 for a virtual visit (video or telephone).   CONSENT FOR VIRTUAL VISIT FOR:  Erika Anderson  By participating in this virtual visit I agree to the following:  I hereby voluntarily request, consent and authorize [facility] and its employed or contracted physicians, physician assistants, nurse practitioners or other licensed health care professionals (the Practitioner), to provide me with telemedicine health care services (the "Services) as deemed necessary by the treating Practitioner. I acknowledge and consent to receive the Services by the Practitioner via telemedicine. I understand that the telemedicine visit will involve communicating with the Practitioner through live audiovisual communication technology and the disclosure of certain medical information by electronic transmission. I acknowledge that I have been given the opportunity to request an in-person assessment or other available alternative prior to the telemedicine visit and am voluntarily participating in the telemedicine visit.  I understand that I have the right to withhold or withdraw my consent to the use of telemedicine in the course of my care at any time, without affecting my right to future care or treatment, and that the Practitioner or I may terminate the telemedicine visit at any time. I understand that I have the right to inspect all information obtained and/or recorded in the course of the telemedicine visit and may receive copies of available information for a reasonable fee.  I understand that some of the potential risks of receiving the Services via telemedicine include:  Delay or interruption in medical evaluation due to technological equipment failure or disruption; Information transmitted may not be sufficient (e.g. poor resolution of images)  to allow for appropriate medical decision making by the Practitioner; and/or  In rare instances, security protocols could fail, causing a breach of personal health information.  Furthermore, I acknowledge that it is my responsibility to provide information about my medical history, conditions and care that is complete and accurate to the best of my ability. I acknowledge that Practitioner's advice, recommendations, and/or decision may be based on factors not within their control, such as incomplete or inaccurate data provided by me or distortions of diagnostic images or specimens that may result from electronic transmissions. I understand that the practice of medicine is not an exact science and that Practitioner makes no warranties or guarantees regarding treatment outcomes. I acknowledge that a copy of this consent can be made available to me via my patient portal [facility] MyChart), or I can request a printed copy by calling the office of [facility].    I understand that my insurance will be billed for this visit.   I have read or had this consent read to me. I understand the contents of this consent, which adequately explains the benefits and risks of the Services being provided via telemedicine.  I have been provided ample opportunity to ask questions regarding this consent and the Services and have had my questions answered to my satisfaction. I give my informed consent for the services to be provided through the use of telemedicine in my medical care

## 2024-02-07 NOTE — Telephone Encounter (Signed)
Patient has been scheduled for televisit.

## 2024-02-07 NOTE — Telephone Encounter (Signed)
 Requested Prescriptions  Pending Prescriptions Disp Refills   albuterol  (VENTOLIN  HFA) 108 (90 Base) MCG/ACT inhaler [Pharmacy Med Name: Albuterol  Sulfate HFA 108 (90 Base) MCG/ACT Inhalation Aerosol Solution] 9 g 0    Sig: INHALE 2 PUFFS BY MOUTH EVERY 6 HOURS AS NEEDED FOR WHEEZING FOR SHORTNESS OF BREATH     Pulmonology:  Beta Agonists 2 Failed - 02/07/2024  2:11 PM      Failed - Last BP in normal range    BP Readings from Last 1 Encounters:  11/08/23 (!) 158/66         Passed - Last Heart Rate in normal range    Pulse Readings from Last 1 Encounters:  11/08/23 (!) 59         Passed - Valid encounter within last 12 months    Recent Outpatient Visits           3 months ago COVID-19 virus RNA test result positive at limit of detection   [facility] [place], Bloomingdale T, NP   3 months ago COVID-19 virus RNA test result positive at limit of detection   [facility] [place], Melanie T, NP   5 months ago CLL (chronic lymphocytic leukemia) (HCC)   [facility] [place], Melanie DASEN, NP

## 2024-02-07 NOTE — Telephone Encounter (Signed)
 Waiting on pharmacy recommendations but does patient need a televisit or in office visit please advise

## 2024-02-07 NOTE — Telephone Encounter (Signed)
   Name: Erika Anderson [facility]  DOB: 01-14-1951  MRN: [id number]  Primary Cardiologist: Redell Cave, MD   Preoperative team, please contact this patient and set up a phone call appointment for further preoperative risk assessment. Please obtain consent and complete medication review. Thank you for your help.  I confirm that guidance regarding antiplatelet and oral anticoagulation therapy has been completed and, if necessary, noted below.  Patient has not had an Afib/aflutter ablation within the last 3 months or DCCV within the last 30 days     Per office protocol, patient can hold Eliquis  for 3 days prior to procedure.  I also confirmed the patient resides in the state of [place] . As per [facility] Medical Board telemedicine laws, the patient must reside in the state in which the provider is licensed.   Erika CHRISTELLA Beauvais, NP 02/07/2024, 11:01 AM [facility]

## 2024-02-07 NOTE — Telephone Encounter (Signed)
 Patient with diagnosis of atrial fibrillation on Eliquis  for anticoagulation.    Procedure:  Right Total Knee Arthroplasty   Date of Surgery:  Clearance 02/21/24      CHA2DS2-VASc Score = 2   This indicates a 2.2% annual risk of stroke. The patient's score is based upon: CHF History: 0 HTN History: 0 Diabetes History: 0 Stroke History: 0 Vascular Disease History: 0 Age Score: 1 Gender Score: 1       CrCl 110 Platelet count 182  Patient has not had an Afib/aflutter ablation within the last 3 months or DCCV within the last 30 days   Per office protocol, patient can hold Eliquis  for 3 days prior to procedure.    Patient will not need bridging with Lovenox (enoxaparin) around procedure.  **This guidance is not considered finalized until pre-operative APP has relayed final recommendations.**

## 2024-02-08 ENCOUNTER — Inpatient Hospital Stay: Admitting: Oncology

## 2024-02-08 ENCOUNTER — Inpatient Hospital Stay: Attending: Oncology

## 2024-02-08 ENCOUNTER — Other Ambulatory Visit: Payer: Self-pay

## 2024-02-08 ENCOUNTER — Encounter: Payer: Self-pay | Admitting: Oncology

## 2024-02-08 VITALS — BP 146/62 | HR 56 | Temp 96.7°F | Resp 18 | Wt 245.6 lb

## 2024-02-08 DIAGNOSIS — Z809 Family history of malignant neoplasm, unspecified: Secondary | ICD-10-CM

## 2024-02-08 DIAGNOSIS — I4891 Unspecified atrial fibrillation: Secondary | ICD-10-CM | POA: Diagnosis not present

## 2024-02-08 DIAGNOSIS — G2581 Restless legs syndrome: Secondary | ICD-10-CM | POA: Diagnosis not present

## 2024-02-08 DIAGNOSIS — Z5111 Encounter for antineoplastic chemotherapy: Secondary | ICD-10-CM

## 2024-02-08 DIAGNOSIS — Z8249 Family history of ischemic heart disease and other diseases of the circulatory system: Secondary | ICD-10-CM | POA: Diagnosis not present

## 2024-02-08 DIAGNOSIS — Z8041 Family history of malignant neoplasm of ovary: Secondary | ICD-10-CM | POA: Diagnosis not present

## 2024-02-08 DIAGNOSIS — Z90710 Acquired absence of both cervix and uterus: Secondary | ICD-10-CM | POA: Diagnosis not present

## 2024-02-08 DIAGNOSIS — Z7901 Long term (current) use of anticoagulants: Secondary | ICD-10-CM | POA: Insufficient documentation

## 2024-02-08 DIAGNOSIS — Z79899 Other long term (current) drug therapy: Secondary | ICD-10-CM | POA: Diagnosis not present

## 2024-02-08 DIAGNOSIS — Z9842 Cataract extraction status, left eye: Secondary | ICD-10-CM | POA: Insufficient documentation

## 2024-02-08 DIAGNOSIS — Z818 Family history of other mental and behavioral disorders: Secondary | ICD-10-CM | POA: Insufficient documentation

## 2024-02-08 DIAGNOSIS — C9110 Chronic lymphocytic leukemia of B-cell type not having achieved remission: Secondary | ICD-10-CM | POA: Diagnosis not present

## 2024-02-08 DIAGNOSIS — Z823 Family history of stroke: Secondary | ICD-10-CM | POA: Diagnosis not present

## 2024-02-08 DIAGNOSIS — Z9841 Cataract extraction status, right eye: Secondary | ICD-10-CM | POA: Insufficient documentation

## 2024-02-08 DIAGNOSIS — E669 Obesity, unspecified: Secondary | ICD-10-CM | POA: Insufficient documentation

## 2024-02-08 DIAGNOSIS — Z801 Family history of malignant neoplasm of trachea, bronchus and lung: Secondary | ICD-10-CM | POA: Diagnosis not present

## 2024-02-08 DIAGNOSIS — Z8261 Family history of arthritis: Secondary | ICD-10-CM | POA: Insufficient documentation

## 2024-02-08 DIAGNOSIS — E785 Hyperlipidemia, unspecified: Secondary | ICD-10-CM | POA: Insufficient documentation

## 2024-02-08 LAB — CBC WITH DIFFERENTIAL (CANCER CENTER ONLY)
Abs Immature Granulocytes: 0.01 K/uL (ref 0.00–0.07)
Basophils Absolute: 0 K/uL (ref 0.0–0.1)
Basophils Relative: 1 %
Eosinophils Absolute: 0.1 K/uL (ref 0.0–0.5)
Eosinophils Relative: 2 %
HCT: 38.2 % (ref 36.0–46.0)
Hemoglobin: 12.7 g/dL (ref 12.0–15.0)
Immature Granulocytes: 0 %
Lymphocytes Relative: 38 %
Lymphs Abs: 2.6 K/uL (ref 0.7–4.0)
MCH: 29.7 pg (ref 26.0–34.0)
MCHC: 33.2 g/dL (ref 30.0–36.0)
MCV: 89.5 fL (ref 80.0–100.0)
Monocytes Absolute: 0.7 K/uL (ref 0.1–1.0)
Monocytes Relative: 10 %
Neutro Abs: 3.4 K/uL (ref 1.7–7.7)
Neutrophils Relative %: 49 %
Platelet Count: 187 K/uL (ref 150–400)
RBC: 4.27 MIL/uL (ref 3.87–5.11)
RDW: 13.1 % (ref 11.5–15.5)
WBC Count: 6.8 K/uL (ref 4.0–10.5)
nRBC: 0 % (ref 0.0–0.2)

## 2024-02-08 LAB — CMP (CANCER CENTER ONLY)
ALT: 17 U/L (ref 0–44)
AST: 21 U/L (ref 15–41)
Albumin: 4 g/dL (ref 3.5–5.0)
Alkaline Phosphatase: 60 U/L (ref 38–126)
Anion gap: 7 (ref 5–15)
BUN: 14 mg/dL (ref 8–23)
CO2: 25 mmol/L (ref 22–32)
Calcium: 9 mg/dL (ref 8.9–10.3)
Chloride: 106 mmol/L (ref 98–111)
Creatinine: 0.87 mg/dL (ref 0.44–1.00)
GFR, Estimated: >60 mL/min (ref >60)
Glucose, Bld: 93 mg/dL (ref 70–99)
Potassium: 4 mmol/L (ref 3.5–5.1)
Sodium: 138 mmol/L (ref 135–145)
Total Bilirubin: 1 mg/dL (ref 0.0–1.2)
Total Protein: 6.5 g/dL (ref 6.5–8.1)

## 2024-02-08 LAB — LACTATE DEHYDROGENASE: LDH: 149 U/L (ref 98–192)

## 2024-02-08 MED ORDER — BRUKINSA 80 MG PO CAPS
160.0000 mg | ORAL_CAPSULE | Freq: Two times a day (BID) | ORAL | 3 refills | Status: DC
Start: 2024-02-08 — End: 2024-04-13
  Filled 2024-02-08: qty 120, 30d supply, fill #0
  Filled 2024-03-05: qty 120, 30d supply, fill #1
  Filled 2024-04-05: qty 120, 30d supply, fill #2

## 2024-02-08 NOTE — Assessment & Plan Note (Addendum)
 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   She is on Elqiuis, will watch for possible increase risk of bleeding.  Labs are reviewed and discussed with patient. Continue Zanubrutinib  160mg  BID, clinically she tolerates well. Follow-up future knee replacement, recommend patient to hold off Zanubrutinib  for 2 to 3 days prior to procedure.  She may resume after surgery.

## 2024-02-08 NOTE — Assessment & Plan Note (Signed)
 Lab Results  Component Value Date   HGB 12.7 02/08/2024   TIBC 302 11/08/2023   IRONPCTSAT 31 11/08/2023   FERRITIN 63 11/08/2023    Continue iron  supplementation Vitron C every other day to further increase ferritin, goal is > 75 Continue to monitor.

## 2024-02-08 NOTE — Progress Notes (Signed)
 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   She is on Elqiuis, will watch for possible increase risk of bleeding.  Labs are reviewed and discussed with patient. Continue Zanubrutinib  160mg  BID, clinically she tolerates well. Follow-up future knee replacement, recommend patient to hold off Zanubrutinib  for 2 to 3 days prior to procedure.  She may resume after surgery.    Encounter for antineoplastic chemotherapy Continue Zanubrutinib  160mg  BID  RLS (restless legs syndrome) Lab Results  Component Value Date   HGB 12.7 02/08/2024   TIBC 302 11/08/2023   IRONPCTSAT 31 11/08/2023   FERRITIN 63 11/08/2023    Continue iron  supplementation Vitron C every other day to further increase ferritin, goal is > 75 Continue to monitor.   Follow up 3 months  Call Babara, MD, PhD [facility] Hematology Oncology 02/08/2024       HISTORY OF PRESENTING ILLNESS:  Erika Anderson [facility] 73 y.o. female presents to follow up with CLL. She was found to have abnormal CBC on 03/01/22, total white count of 15, predominantly lymphocytosis. This is chronic since Feb 2023. Peripheral blood smear + smudge cells.  + Restless leg syndrome, sleep disturbance + Family history of ovarian cancer -mother.    Oncology History  CLL (chronic lymphocytic leukemia) (HCC)  03/02/2022 Initial Diagnosis   CLL (chronic lymphocytic leukemia)  -03/15/22  Flowcytometry showed chronic lymphocytic leukemia, B cell, CD38 expression indeterminate for prognosis (23%) and CD20 positive.  45% nuclei positive for TP53 gene deletion.    03/26/2022 Imaging   US  abdomen 1. No sonographic etiology for night sweats identified. 2. Spleen is at the upper limits of normal in size    04/01/2022 Cancer Staging   Staging form: Chronic Lymphocytic Leukemia / Small Lymphocytic Lymphoma, AJCC 8th Edition - Clinical: Modified Rai Stage 0 (Modified Rai risk: Low, Lymphocytosis: Present, Adenopathy: Absent, Organomegaly: Absent, Anemia: Absent, Thrombocytopenia: Absent) - Signed by Babara Call, MD on 04/01/2022 Stage prefix: Initial diagnosis Absolute lymphocyte count (ALC) (cells/uL): 18   04/19/2022 -  Chemotherapy   Started on Zanubrutinib  160mg  BID      She reports tolerating Zanubrutinib  160mg  BID well. Night sweat has resolved  Appetite is fair.she takes Vitron C every other day  Patient has intentional weight loss with dieting.  There is plan for right knee replacement.   MEDICAL HISTORY:  Past Medical History:  Diagnosis Date   Atrial fibrillation (HCC)    Chronic lymphocytic leukemia (HCC)    Grade II diastolic dysfunction    Hyperlipidemia    Leukemia (HCC)    Mild mitral regurgitation by prior echocardiogram    Obesity (BMI 30-39.9)    Restless legs syndrome (RLS) 12/28/2014   RLS (restless legs syndrome)     SURGICAL HISTORY: Past Surgical History:  Procedure Laterality Date   ATRIAL FIBRILLATION ABLATION N/A 03/22/2022   Procedure: ATRIAL FIBRILLATION ABLATION;  Surgeon: Cindie Ole DASEN, MD;  Location: [facility] INVASIVE CV [facility];  Service: Cardiovascular;  Laterality: N/A;   CATARACT EXTRACTION W/PHACO Left 05/18/2023   Procedure: CATARACT EXTRACTION  PHACO AND INTRAOCULAR LENS PLACEMENT (IOC) LEFT;  Surgeon: Mittie Gaskin, MD;  Location: [facility];  Service: Ophthalmology;  Laterality: Left;  6.95 0:44.8   CATARACT  EXTRACTION W/PHACO Right 05/25/2023   Procedure: CATARACT EXTRACTION PHACO AND INTRAOCULAR LENS PLACEMENT (IOC) RIGHT 5.35 00:35.4;  Surgeon: Mittie Gaskin, MD;  Location: [facility];  Service: Ophthalmology;  Laterality: Right;   COLONOSCOPY WITH PROPOFOL  N/A 08/24/2021   Procedure: COLONOSCOPY WITH PROPOFOL ;  Surgeon: Therisa Bi, MD;  Location: [facility] ENDOSCOPY;  Service: Gastroenterology;  Laterality: N/A;   HEMORROIDECTOMY     TOTAL ABDOMINAL HYSTERECTOMY     ovaries remain    SOCIAL HISTORY: Social History   Socioeconomic History   Marital status: Married    Spouse name: Not on file   Number of children: Not on file   Years of education: 12   Highest education level: High school graduate  Occupational History   Occupation: retired  Tobacco Use   Smoking status: Never   Smokeless tobacco: Never   Tobacco comments:    Never smoke 04/19/22  Vaping Use   Vaping status: Never Used  Substance and Sexual Activity   Alcohol use: No    Alcohol/week: 0.0 standard drinks of alcohol   Drug use: No   Sexual activity: Not Currently  Other Topics Concern   Not on file  Social History Narrative   Helps take care of grandchildren    Social Drivers of Health   Financial Resource Strain: Low Risk  (03/08/2023)   Overall Financial Resource Strain (CARDIA)    Difficulty of Paying Living Expenses: Not hard at all  Food Insecurity: No Food Insecurity (03/08/2023)   Hunger Vital Sign    Worried About Running Out of Food in the Last Year: Never true    Ran Out of Food in the Last Year: Never true  Transportation Needs: No Transportation Needs (03/08/2023)   PRAPARE - Administrator, Civil Service (Medical): No    Lack of Transportation (Non-Medical): No  Physical Activity: Sufficiently Active (03/08/2023)   Exercise Vital Sign    Days of Exercise per Week: 7 days    Minutes of Exercise per Session: 30 min  Stress: No Stress Concern Present (03/08/2023)   [employer] of Occupational Health - Occupational Stress Questionnaire    Feeling of Stress : Not at all  Social Connections: Moderately Integrated (03/08/2023)   Social Connection and Isolation Panel    Frequency of Communication with Friends and Family: More than three times a week    Frequency of Social Gatherings with Friends and Family: Twice a week    Attends Religious Services: More than 4 times per year    Active Member of [employer] or Organizations: No    Attends Banker Meetings: Never    Marital Status: Married  Catering manager Violence: Not At Risk (03/08/2023)   Humiliation, Afraid, Rape, and Kick questionnaire    Fear of Current or Ex-Partner: No    Emotionally Abused: No    Physically Abused: No    Sexually Abused: No    FAMILY HISTORY: Family History  Problem Relation Age of Onset   Cancer Mother        ovarian   Dementia Father    Arthritis Sister    Heart disease Brother        MI   Stroke Paternal Grandmother    Cancer Paternal Grandfather        lung   Breast cancer  Neg Hx     ALLERGIES:  has no known allergies.  MEDICATIONS:  Current Outpatient Medications  Medication Sig Dispense Refill   albuterol  (VENTOLIN  HFA) 108 (90 Base) MCG/ACT inhaler INHALE 2 PUFFS BY MOUTH EVERY 6 HOURS AS NEEDED FOR WHEEZING FOR SHORTNESS OF BREATH 9 g 0   apixaban  (ELIQUIS ) 5 MG TABS tablet Take 1 tablet (5 mg total) by mouth 2 (two) times daily. 180 tablet 3   betamethasone  dipropionate 0.05 % lotion Apply topically daily.     citalopram  (CELEXA ) 20 MG tablet Take 1 tablet (20 mg total) by mouth daily. 90 tablet 3   gabapentin  (NEURONTIN ) 300 MG capsule Take 2 capsules (600 mg total) by mouth at bedtime. 180 capsule 3   Iron -Vitamin C  65-125 MG TABS Take 1 tablet by mouth every other day. 30 tablet 1   ketoconazole (NIZORAL) 2 % shampoo Apply topically 3 (three) times a week.     metoprolol  tartrate (LOPRESSOR ) 25 MG tablet Take 1 tablet (25 mg total) by mouth 2  (two) times daily. 180 tablet 4   Multiple Vitamins-Minerals (MULTIVITAMIN WITH MINERALS) tablet Take 1 tablet by mouth daily.     pantoprazole  (PROTONIX ) 40 MG tablet Take 1 tablet (40 mg total) by mouth daily. 90 tablet 3   rOPINIRole  (REQUIP ) 1 MG tablet Take 1 tablet (1 mg total) by mouth 4 (four) times daily. 360 tablet 4   rosuvastatin  (CRESTOR ) 20 MG tablet Take 1 tablet (20 mg total) by mouth daily. 90 tablet 4   traZODone  (DESYREL ) 50 MG tablet Take 1 tablet (50 mg total) by mouth at bedtime as needed for sleep. 90 tablet 4   zanubrutinib  (BRUKINSA ) 80 MG capsule Take 2 capsules (160 mg total) by mouth 2 (two) times daily. 120 capsule 3   No current facility-administered medications for this visit.    Review of Systems  Constitutional:  Negative for appetite change, chills, fatigue and fever.  HENT:   Negative for hearing loss and voice change.   Eyes:  Negative for eye problems.  Respiratory:  Negative for chest tightness and cough.   Cardiovascular:  Negative for chest pain.  Gastrointestinal:  Negative for abdominal distention, abdominal pain and blood in stool.  Endocrine: Negative for hot flashes.       Night sweat  Genitourinary:  Negative for difficulty urinating and frequency.   Musculoskeletal:  Negative for arthralgias.  Skin:  Negative for itching and rash.  Neurological:  Negative for extremity weakness.  Hematological:  Negative for adenopathy.  Psychiatric/Behavioral:  Positive for sleep disturbance. Negative for confusion.      PHYSICAL EXAMINATION: ECOG PERFORMANCE STATUS: 1 - Symptomatic but completely ambulatory  Vitals:   02/08/24 1006  BP: (!) 146/62  Pulse: (!) 56  Resp: 18  Temp: (!) 96.7 F (35.9 C)  SpO2: 99%   Filed Weights   02/08/24 1006  Weight: 245 lb 9.6 oz (111.4 kg)    Physical Exam HENT:     Head: Normocephalic.     Nose: Nose normal.     Mouth/Throat:     Pharynx: No oropharyngeal exudate.  Eyes:     General: No scleral  icterus.    Pupils: Pupils are equal, round, and reactive to light.  Cardiovascular:     Rate and Rhythm: Normal rate.  Pulmonary:     Effort: Pulmonary effort is normal. No respiratory distress.     Breath sounds: No wheezing.  Abdominal:     General: There is no distension.  Palpations: Abdomen is soft.  Musculoskeletal:        General: Normal range of motion.     Cervical back: Normal range of motion.  Skin:    General: Skin is warm and dry.     Findings: No erythema.  Neurological:     Mental Status: She is alert and oriented to person, place, and time. Mental status is at baseline.     Cranial Nerves: No cranial nerve deficit.     Motor: No abnormal muscle tone.  Psychiatric:        Mood and Affect: Affect normal.     LABORATORY DATA:  I have reviewed the data as listed    Latest Ref Rng & Units 02/08/2024    9:49 AM 11/08/2023   10:13 AM 07/19/2023   10:02 AM  CBC  WBC 4.0 - 10.5 K/uL 6.8  6.7  7.0   Hemoglobin 12.0 - 15.0 g/dL 87.2  87.6  87.2   Hematocrit 36.0 - 46.0 % 38.2  37.7  39.1   Platelets 150 - 400 K/uL 187  182  197       Latest Ref Rng & Units 02/08/2024    9:49 AM 11/08/2023   10:13 AM 07/19/2023   10:02 AM  CMP  Glucose 70 - 99 mg/dL 93  96  94   BUN 8 - 23 mg/dL 14  15  17    Creatinine 0.44 - 1.00 mg/dL 9.12  9.12  8.98   Sodium 135 - 145 mmol/L 138  142  139   Potassium 3.5 - 5.1 mmol/L 4.0  4.6  3.7   Chloride 98 - 111 mmol/L 106  107  104   CO2 22 - 32 mmol/L 25  27  28    Calcium  8.9 - 10.3 mg/dL 9.0  8.6  8.9   Total Protein 6.5 - 8.1 g/dL 6.5  6.6  6.9   Total Bilirubin 0.0 - 1.2 mg/dL 1.0  0.6  0.7   Alkaline Phos 38 - 126 U/L 60  61  64   AST 15 - 41 U/L 21  24  21    ALT 0 - 44 U/L 17  23  19     Lab Results  Component Value Date   LDH 149 02/08/2024   LDH 148 11/08/2023   LDH 155 07/19/2023    RADIOGRAPHIC STUDIES: I have personally reviewed the radiological images as listed and agreed with the findings in the report. No results  found.

## 2024-02-08 NOTE — Assessment & Plan Note (Signed)
 Continue Zanubrutinib 160mg  BID

## 2024-02-09 DIAGNOSIS — Z01818 Encounter for other preprocedural examination: Secondary | ICD-10-CM | POA: Diagnosis not present

## 2024-02-09 DIAGNOSIS — M1711 Unilateral primary osteoarthritis, right knee: Secondary | ICD-10-CM | POA: Diagnosis not present

## 2024-02-11 ENCOUNTER — Encounter: Payer: Self-pay | Admitting: Nurse Practitioner

## 2024-02-11 DIAGNOSIS — R399 Unspecified symptoms and signs involving the genitourinary system: Secondary | ICD-10-CM

## 2024-02-14 ENCOUNTER — Ambulatory Visit: Attending: Cardiology

## 2024-02-14 ENCOUNTER — Encounter: Payer: Self-pay | Admitting: Nurse Practitioner

## 2024-02-14 ENCOUNTER — Ambulatory Visit (INDEPENDENT_AMBULATORY_CARE_PROVIDER_SITE_OTHER): Admitting: Nurse Practitioner

## 2024-02-14 VITALS — BP 135/70 | HR 50 | Temp 97.9°F | Resp 16 | Ht 65.98 in | Wt 244.0 lb

## 2024-02-14 DIAGNOSIS — Z1231 Encounter for screening mammogram for malignant neoplasm of breast: Secondary | ICD-10-CM

## 2024-02-14 DIAGNOSIS — Z01810 Encounter for preprocedural cardiovascular examination: Secondary | ICD-10-CM

## 2024-02-14 DIAGNOSIS — Z23 Encounter for immunization: Secondary | ICD-10-CM

## 2024-02-14 DIAGNOSIS — H1132 Conjunctival hemorrhage, left eye: Secondary | ICD-10-CM | POA: Insufficient documentation

## 2024-02-14 NOTE — Assessment & Plan Note (Signed)
 Acute, suspect recent stye which is improved with her using erythromycin  ointment at home, continue this for 3 more days.  Recommend warm compresses to eye a few times a day.  Educated her on this type of hemorrhage and period of time it takes to improve.  Overall eye exam reassuring, vision intact and no pain.

## 2024-02-14 NOTE — Progress Notes (Signed)
 Virtual Visit via Telephone Note   Because of Erika Anderson co-morbid illnesses, she is at least at moderate risk for complications without adequate follow up.  This format is felt to be most appropriate for this patient at this time.  Due to technical limitations with video connection Web designer), today's appointment will be conducted as an audio only telehealth visit, and Erika Anderson verbally agreed to proceed in this manner.   All issues noted in this document were discussed and addressed.  No physical exam could be performed with this format.  Evaluation Performed:  Preoperative cardiovascular risk assessment _____________   Date:  02/14/2024   Patient ID:  Erika Anderson, DOB 1950-11-20, MRN [id number] Patient Location:  Home Provider location:   Office  Primary Care Provider:  Valerio Melanie DASEN, NP Primary Cardiologist:  Redell Cave, MD  Chief Complaint / Patient Profile   73 y.o. y/o female with a h/o aortic atherosclerosis, atrial fibrillation, mild systolic CHF who is pending right total knee arthroplasty and presents today for telephonic preoperative cardiovascular risk assessment.  History of Present Illness    Erika Anderson is a 73 y.o. female who presents via audio/video conferencing for a telehealth visit today.  Pt was last seen in cardiology clinic on 10/06/2023 by Dr. Cave.  At that time Erika Anderson was doing well .  The patient is now pending procedure as outlined above. Since her last visit, she continues to be stable from a cardiac standpoint.  Today she denies chest pain, shortness of breath, lower extremity edema, fatigue, palpitations, melena, hematuria, hemoptysis, diaphoresis, weakness, presyncope, syncope, orthopnea, and PND.   Past Medical History    Past Medical History:  Diagnosis Date   Atrial fibrillation (HCC)    Chronic lymphocytic leukemia (HCC)    Grade II diastolic dysfunction    Hyperlipidemia     Leukemia (HCC)    Mild mitral regurgitation by prior echocardiogram    Obesity (BMI 30-39.9)    Restless legs syndrome (RLS) 12/28/2014   RLS (restless legs syndrome)    Past Surgical History:  Procedure Laterality Date   ATRIAL FIBRILLATION ABLATION N/A 03/22/2022   Procedure: ATRIAL FIBRILLATION ABLATION;  Surgeon: Cindie Ole DASEN, MD;  Location: [facility];  Service: Cardiovascular;  Laterality: N/A;   CATARACT EXTRACTION W/PHACO Left 05/18/2023   Procedure: CATARACT EXTRACTION PHACO AND INTRAOCULAR LENS PLACEMENT (IOC) LEFT;  Surgeon: Mittie Gaskin, MD;  Location: [facility];  Service: Ophthalmology;  Laterality: Left;  6.95 0:44.8   CATARACT EXTRACTION W/PHACO Right 05/25/2023   Procedure: CATARACT EXTRACTION PHACO AND INTRAOCULAR LENS PLACEMENT (IOC) RIGHT 5.35 00:35.4;  Surgeon: Mittie Gaskin, MD;  Location: [facility];  Service: Ophthalmology;  Laterality: Right;   COLONOSCOPY WITH PROPOFOL  N/A 08/24/2021   Procedure: COLONOSCOPY WITH PROPOFOL ;  Surgeon: Therisa Bi, MD;  Location: [facility] END[facility];  Service: Gastroenterology;  Laterality: N/A;   HEMORROIDECTOMY     TOTAL ABDOMINAL HYSTERECTOMY     ovaries remain    Allergies  No Known Allergies  Home Medications    Prior to Admission medications   Medication Sig Start Date End Date Taking? Authorizing Provider  albuterol  (VENTOLIN  HFA) 108 (90 Base) MCG/ACT inhaler INHALE 2 PUFFS BY MOUTH EVERY 6 HOURS AS NEEDED FOR WHEEZING FOR SHORTNESS OF BREATH 02/07/24   Cannady, Jolene T, NP  apixaban  (ELIQUIS ) 5 MG TABS tablet Take 1 tablet (5 mg total) by mouth 2 (two) times daily. 08/29/23   Cannady, Jolene T, NP  betamethasone  dipropionate 0.05 % lotion Apply topically daily. 09/14/22   [provider]  citalopram  (CELEXA ) 20 MG tablet Take 1 tablet (20 mg total) by mouth daily. 08/29/23   Cannady, Jolene T, NP  gabapentin  (NEURONTIN ) 300 MG capsule Take 2 capsules (600 mg total) by mouth  at bedtime. 10/12/23   Cannady, Jolene T, NP  Iron -Vitamin C  65-125 MG TABS Take 1 tablet by mouth every other day. 07/19/23   Babara Call, MD  ketoconazole (NIZORAL) 2 % shampoo Apply topically 3 (three) times a week. 09/14/22   [provider]  metoprolol  tartrate (LOPRESSOR ) 25 MG tablet Take 1 tablet (25 mg total) by mouth 2 (two) times daily. 08/29/23   Cannady, Jolene T, NP  Multiple Vitamins-Minerals (MULTIVITAMIN WITH MINERALS) tablet Take 1 tablet by mouth daily.    [provider]  pantoprazole  (PROTONIX ) 40 MG tablet Take 1 tablet (40 mg total) by mouth daily. 05/24/23 08/08/24  Darliss Rogue, MD  rOPINIRole  (REQUIP ) 1 MG tablet Take 1 tablet (1 mg total) by mouth 4 (four) times daily. 08/29/23   Cannady, Jolene T, NP  rosuvastatin  (CRESTOR ) 20 MG tablet Take 1 tablet (20 mg total) by mouth daily. 08/29/23   Cannady, Jolene T, NP  traZODone  (DESYREL ) 50 MG tablet Take 1 tablet (50 mg total) by mouth at bedtime as needed for sleep. 08/29/23   Cannady, Jolene T, NP  zanubrutinib  (BRUKINSA ) 80 MG capsule Take 2 capsules (160 mg total) by mouth 2 (two) times daily. 02/08/24   Babara Call, MD    Physical Exam    Vital Signs:  Erika Anderson does not have vital signs available for review today.  Given telephonic nature of communication, physical exam is limited. AAOx3. NAD. Normal affect.  Speech and respirations are unlabored.  Accessory Clinical Findings    None  Assessment & Plan    1.  Preoperative Cardiovascular Risk Assessment:Right Total Knee Arthroplasty   Date of Surgery:  Clearance 02/21/24                                  Surgeon:  Dr Marchia Socks Group or Practice Name:  [facility] Phone number:  [phone] Fax number:  [phone]      Primary Cardiologist: Rogue Darliss, MD  Chart reviewed as part of pre-operative protocol coverage. Given past medical history and time since last visit, based on ACC/AHA guidelines, Erika Anderson would be  at acceptable risk for the planned procedure without further cardiovascular testing.   Her RCRI is low risk, 0.9% risk of major cardiac event.  She is able to complete greater than 4 METS of physical activity.  Patient was advised that if she develops new symptoms prior to surgery to contact our office to arrange a follow-up appointment.  She verbalized understanding.  Her Eliquis  may be held for 3 days prior to her procedure.  Please resume as soon as hemostasis is achieved.  I will route this recommendation to the requesting party via Epic fax function and remove from pre-op pool.       Time:   Today, I have spent 5 minutes with the patient with telehealth technology discussing medical history, symptoms, and management plan. I spent 10 minutes reviewing patient's past cardiac history and cardiac medications.     Erika CHRISTELLA Beauvais, NP  02/14/2024, 7:49 AM

## 2024-02-14 NOTE — Patient Instructions (Signed)
 Bacterial Conjunctivitis, Adult  Bacterial conjunctivitis is an infection of your conjunctiva. This is the clear membrane that covers the white part of your eye and the inner part of your eyelid. This infection can make your eye:  Red or pink.  Itchy or irritated.  This condition spreads easily from person to person (is contagious) and from one eye to the other eye.  What are the causes?  This condition is caused by germs (bacteria). You may get the infection if you come into close contact with:  A person who has the infection.  Items that have germs on them (are contaminated), such as face towels, contact lens solution, or eye makeup.  What increases the risk?  You are more likely to get this condition if:  You have contact with people who have the infection.  You wear contact lenses.  You have a sinus infection.  You have had a recent eye injury or surgery.  You have a weak body defense system (immune system).  You have dry eyes.  What are the signs or symptoms?    Thick, yellowish discharge from the eye.  Tearing or watery eyes.  Itchy eyes.  Burning feeling in your eyes.  Eye redness.  Swollen eyelids.  Blurred vision.  How is this treated?    Antibiotic eye drops or ointment.  Antibiotic medicine taken by mouth. This is used for infections that do not get better with drops or ointment or that last more than 10 days.  Cool, wet cloths placed on the eyes.  Artificial tears used 2-6 times a day.  Follow these instructions at home:  Medicines  Take or apply your antibiotic medicine as told by your doctor. Do not stop using it even if you start to feel better.  Take or apply over-the-counter and prescription medicines only as told by your doctor.  Do not touch your eyelid with the eye-drop bottle or the ointment tube.  Managing discomfort  Wipe any fluid from your eye with a warm, wet washcloth or a cotton ball.  Place a clean, cool, wet cloth on your eye. Do this for 10-20 minutes, 3-4 times a day.  General  instructions  Do not wear contacts until the infection is gone. Wear glasses until your doctor says it is okay to wear contacts again.  Do not wear eye makeup until the infection is gone. Throw away old eye makeup.  Change or wash your pillowcase every day.  Do not share towels or washcloths.  Wash your hands often with soap and water for at least 20 seconds and especially before touching your face or eyes. Use paper towels to dry your hands.  Do not touch or rub your eyes.  Do not drive or use heavy machinery if your vision is blurred.  Contact a doctor if:  You have a fever.  You do not get better after 10 days.  Get help right away if:  You have a fever and your symptoms get worse all of a sudden.  You have very bad pain when you move your eye.  Your face:  Hurts.  Is red.  Is swollen.  You have sudden loss of vision.  Summary  Bacterial conjunctivitis is an infection of your conjunctiva.  This infection spreads easily from person to person.  Wash your hands often with soap and water for at least 20 seconds and especially before touching your face or eyes. Use paper towels to dry your hands.  Take or apply your  antibiotic medicine as told by your doctor.  Contact a doctor if you have a fever or you do not get better after 10 days.  This information is not intended to replace advice given to you by your health care provider. Make sure you discuss any questions you have with your health care provider.  Document Revised: 09/03/2020 Document Reviewed: 09/03/2020  Elsevier Patient Education  2024 [employer].

## 2024-02-14 NOTE — Progress Notes (Signed)
 BP 135/70 (BP Location: Left Arm, Patient Position: Sitting, Cuff Size: Large)   Pulse (!) 50   Temp 97.9 F (36.6 C) (Oral)   Resp 16   Ht 5' 5.98 (1.676 m)   Wt 244 lb (110.7 kg)   SpO2 96%   BMI 39.40 kg/m    Subjective:    Patient ID: Erika Anderson, female    DOB: [birth year], 73 y.o.   MRN: [id number]  HPI: Erika Anderson is a 73 y.o. female  Chief Complaint  Patient presents with   Conjunctivitis    Thought Thursday when it started that it was a stye but since just has bene red. No draining or crusty.    EYE PAIN Started Thursday last week, thought she was getting a stye. Placed erythromycin  ointment on area, had leftover at home. Used ointment for two days which has been helping. Received new eye glasses recently, had exam. Duration:  days Involved eye:  left Onset: sudden Severity: mild  Quality: dull, aching, and throbbing Foreign body sensation:no Visual impairment: no Eye redness: yes Discharge: yes Crusting or matting of eyelids: no Swelling: yes Photophobia: no Itching: yes Tearing: no Headache: no Floaters: no URI symptoms: no Contact lens use: no Close contacts with similar problems: no Eye trauma: no Aggravating factors: unknown Alleviating factors: erythromycin  Status: stable Treatments attempted: erythromycin  ointment  Relevant past medical, surgical, family and social history reviewed and updated as indicated. Interim medical history since our last visit reviewed. Allergies and medications reviewed and updated.  Review of Systems  Constitutional:  Negative for activity change, appetite change, diaphoresis, fatigue and fever.  Eyes:  Positive for discharge, redness and itching. Negative for photophobia, pain and visual disturbance.  Respiratory:  Negative for cough, chest tightness, shortness of breath and wheezing.   Cardiovascular:  Negative for chest pain, palpitations and leg swelling.  Gastrointestinal: Negative.    Neurological: Negative.   Psychiatric/Behavioral: Negative.     Per HPI unless specifically indicated above     Objective:    BP 135/70 (BP Location: Left Arm, Patient Position: Sitting, Cuff Size: Large)   Pulse (!) 50   Temp 97.9 F (36.6 C) (Oral)   Resp 16   Ht 5' 5.98 (1.676 m)   Wt 244 lb (110.7 kg)   SpO2 96%   BMI 39.40 kg/m   Wt Readings from Last 3 Encounters:  02/14/24 244 lb (110.7 kg)  02/08/24 245 lb 9.6 oz (111.4 kg)  11/08/23 262 lb 4.8 oz (119 kg)    Physical Exam Vitals and nursing note reviewed.  Constitutional:      General: She is awake. She is not in acute distress.    Appearance: She is well-developed and well-groomed. She is obese. She is not ill-appearing or toxic-appearing.  HENT:     Head: Normocephalic.     Right Ear: Hearing and external ear normal.     Left Ear: Hearing and external ear normal.  Eyes:     General: Lids are normal. Lids are everted, no foreign bodies appreciated.        Right eye: No foreign body, discharge or hordeolum.        Left eye: No foreign body, discharge or hordeolum.     Extraocular Movements: Extraocular movements intact.     Right eye: Normal extraocular motion.     Left eye: Normal extraocular motion.     Conjunctiva/sclera:     Right eye: Right conjunctiva is not injected. No exudate.  Left eye: Left conjunctiva is injected. Hemorrhage present. No exudate.    Pupils: Pupils are equal, round, and reactive to light.     Visual Fields: Right eye visual fields normal and left eye visual fields normal.  Neck:     Thyroid : No thyromegaly.     Vascular: No carotid bruit.  Cardiovascular:     Rate and Rhythm: Normal rate and regular rhythm.     Heart sounds: Normal heart sounds. No murmur heard.    No gallop.  Pulmonary:     Effort: Pulmonary effort is normal. No accessory muscle usage or respiratory distress.     Breath sounds: Normal breath sounds.  Abdominal:     General: Bowel sounds are normal. There  is no distension.     Palpations: Abdomen is soft.     Tenderness: There is no abdominal tenderness.  Musculoskeletal:     Cervical back: Normal range of motion and neck supple.     Right lower leg: No edema.     Left lower leg: No edema.  Lymphadenopathy:     Cervical: No cervical adenopathy.  Skin:    General: Skin is warm and dry.  Neurological:     Mental Status: She is alert and oriented to person, place, and time.     Deep Tendon Reflexes: Reflexes are normal and symmetric.     Reflex Scores:      Brachioradialis reflexes are 2+ on the right side and 2+ on the left side.      Patellar reflexes are 2+ on the right side and 2+ on the left side. Psychiatric:        Attention and Perception: Attention normal.        Mood and Affect: Mood normal.        Speech: Speech normal.        Behavior: Behavior normal. Behavior is cooperative.        Thought Content: Thought content normal.     Results for orders placed or performed in visit on 02/08/24  Lactate dehydrogenase   Collection Time: 02/08/24  9:49 AM  Result Value Ref Range   LDH 149 98 - 192 U/L  CMP (Cancer Center only)   Collection Time: 02/08/24  9:49 AM  Result Value Ref Range   Sodium 138 135 - 145 mmol/L   Potassium 4.0 3.5 - 5.1 mmol/L   Chloride 106 98 - 111 mmol/L   CO2 25 22 - 32 mmol/L   Glucose, Bld 93 70 - 99 mg/dL   BUN 14 8 - 23 mg/dL   Creatinine 9.12 9.55 - 1.00 mg/dL   Calcium  9.0 8.9 - 10.3 mg/dL   Total Protein 6.5 6.5 - 8.1 g/dL   Albumin 4.0 3.5 - 5.0 g/dL   AST 21 15 - 41 U/L   ALT 17 0 - 44 U/L   Alkaline Phosphatase 60 38 - 126 U/L   Total Bilirubin 1.0 0.0 - 1.2 mg/dL   GFR, Estimated >39 >39 mL/min   Anion gap 7 5 - 15  CBC with Differential (Cancer Center Only)   Collection Time: 02/08/24  9:49 AM  Result Value Ref Range   WBC Count 6.8 4.0 - 10.5 K/uL   RBC 4.27 3.87 - 5.11 MIL/uL   Hemoglobin 12.7 12.0 - 15.0 g/dL   HCT 61.7 63.9 - 53.9 %   MCV 89.5 80.0 - 100.0 fL   MCH 29.7  26.0 - 34.0 pg   MCHC 33.2 30.0 - 36.0  g/dL   RDW 86.8 88.4 - 84.4 %   Platelet Count 187 150 - 400 K/uL   nRBC 0.0 0.0 - 0.2 %   Neutrophils Relative % 49 %   Neutro Abs 3.4 1.7 - 7.7 K/uL   Lymphocytes Relative 38 %   Lymphs Abs 2.6 0.7 - 4.0 K/uL   Monocytes Relative 10 %   Monocytes Absolute 0.7 0.1 - 1.0 K/uL   Eosinophils Relative 2 %   Eosinophils Absolute 0.1 0.0 - 0.5 K/uL   Basophils Relative 1 %   Basophils Absolute 0.0 0.0 - 0.1 K/uL   Immature Granulocytes 0 %   Abs Immature Granulocytes 0.01 0.00 - 0.07 K/uL      Assessment & Plan:   Problem List Items Addressed This Visit       Other   Subconjunctival hemorrhage of left eye - Primary   Acute, suspect recent stye which is improved with her using erythromycin  ointment at home, continue this for 3 more days.  Recommend warm compresses to eye a few times a day.  Educated her on this type of hemorrhage and period of time it takes to improve.  Overall eye exam reassuring, vision intact and no pain.        Other Visit Diagnoses       Encounter for screening mammogram for malignant neoplasm of breast       Mammogram ordered and instructed how to schedule.   Relevant Orders   MM 3D SCREENING MAMMOGRAM BILATERAL BREAST     Flu vaccine need       Flu vaccine today, educated on this.   Relevant Orders   Flu vaccine HIGH DOSE PF(Fluzone Trivalent) (Completed)        Follow up plan: Return if symptoms worsen or fail to improve.

## 2024-02-21 DIAGNOSIS — G8918 Other acute postprocedural pain: Secondary | ICD-10-CM | POA: Diagnosis not present

## 2024-02-21 DIAGNOSIS — M1711 Unilateral primary osteoarthritis, right knee: Secondary | ICD-10-CM | POA: Diagnosis not present

## 2024-02-24 ENCOUNTER — Telehealth: Payer: Self-pay | Admitting: *Deleted

## 2024-02-24 NOTE — Telephone Encounter (Signed)
 Dr. Marchia did total knee on 16 September.  They are calling to see if  the patient can delay her CLL [place] . Dr. Marchia can call him phone [phone]

## 2024-02-27 DIAGNOSIS — M25561 Pain in right knee: Secondary | ICD-10-CM | POA: Diagnosis not present

## 2024-02-27 DIAGNOSIS — M25661 Stiffness of right knee, not elsewhere classified: Secondary | ICD-10-CM | POA: Diagnosis not present

## 2024-02-27 NOTE — Telephone Encounter (Signed)
 Call returned and VM left message to call center: Patient may Erika Anderson 3 days prior to surgery and resume when she resumes Eliquis .

## 2024-02-29 ENCOUNTER — Other Ambulatory Visit

## 2024-02-29 DIAGNOSIS — R399 Unspecified symptoms and signs involving the genitourinary system: Secondary | ICD-10-CM

## 2024-02-29 LAB — URINALYSIS, ROUTINE W REFLEX MICROSCOPIC

## 2024-02-29 LAB — MICROSCOPIC EXAMINATION
Bacteria, UA: NONE SEEN
WBC, UA: NONE SEEN /HPF (ref 0–5)

## 2024-02-29 NOTE — Telephone Encounter (Signed)
 Scheduled

## 2024-02-29 NOTE — Addendum Note (Signed)
 Addended by: Anely Spiewak T on: 02/29/2024 04:45 PM   Modules accepted: Orders

## 2024-03-01 ENCOUNTER — Other Ambulatory Visit: Payer: Self-pay

## 2024-03-01 ENCOUNTER — Telehealth: Admitting: Nurse Practitioner

## 2024-03-01 ENCOUNTER — Encounter: Payer: Self-pay | Admitting: Nurse Practitioner

## 2024-03-01 DIAGNOSIS — N3000 Acute cystitis without hematuria: Secondary | ICD-10-CM

## 2024-03-01 DIAGNOSIS — N390 Urinary tract infection, site not specified: Secondary | ICD-10-CM | POA: Insufficient documentation

## 2024-03-01 MED ORDER — AMOXICILLIN-POT CLAVULANATE 875-125 MG PO TABS: 1.0000 | ORAL_TABLET | Freq: Two times a day (BID) | ORAL | 0 refills | Status: AC

## 2024-03-01 NOTE — Assessment & Plan Note (Signed)
 Acute since after having knee replacement, suspect may have had foley placement during surgery.  UA difficult to interpret due to use of Azo, will send for culture to further assess.  Due to recent surgery and age will start Augmentin  BID for 5 days, as higher risk for UTI.  Determine next steps after culture returns.  Educated patient on the plan.

## 2024-03-01 NOTE — Patient Instructions (Signed)

## 2024-03-01 NOTE — Progress Notes (Signed)
 There were no vitals taken for this visit.   Subjective:    Patient ID: Erika Anderson, female    DOB: 16-Jun-1950, 73 y.o.   MRN: [id number]  HPI: Erika Anderson is a 73 y.o. female  Chief Complaint  Patient presents with   Urinary Tract Infection    Started the day she came home. Urgency and frequency, no burning, no stinging. No fevers, or GI symptoms. No stomach or flank pain.    Virtual Visit via Video Note  I connected with Erika Anderson on 03/01/24 at  8:00 AM EDT by a video enabled telemedicine application and verified that I am speaking with the correct person using two identifiers.  Location: Patient: home Provider: work   I discussed the limitations of evaluation and management by telemedicine and the availability of in person appointments. The patient expressed understanding and agreed to proceed.  I discussed the assessment and treatment plan with the patient. The patient was provided an opportunity to ask questions and all were answered. The patient agreed with the plan and demonstrated an understanding of the instructions.   The patient was advised to call back or seek an in-person evaluation if the symptoms worsen or if the condition fails to improve as anticipated.  I provided 25 minutes of non-face-to-face time during this encounter.   Kaelan Amble T Pedrohenrique [place], NP   URINARY SYMPTOMS Recently had knee surgery on the 16th.  Started having urinary symptoms when she came home and have not gone away.   Dysuria: no Urinary frequency: yes Urgency: yes Small volume voids: no Symptom severity: yes Urinary incontinence: yes Foul odor: no Hematuria: no Abdominal pain: no Back pain: no Suprapubic pain/pressure: yes Flank pain: no Fever:  no Vomiting: no Relief with pyridium: no took it Status: stable Previous urinary tract infection: yes Recurrent urinary tract infection: no Sexual activity: monogamous History of sexually transmitted disease:  no Treatments attempted: pyridium and increasing fluids    Relevant past medical, surgical, family and social history reviewed and updated as indicated. Interim medical history since our last visit reviewed. Allergies and medications reviewed and updated.  Review of Systems  Constitutional:  Negative for activity change, appetite change, diaphoresis, fatigue and fever.  Respiratory:  Negative for cough, chest tightness, shortness of breath and wheezing.   Cardiovascular:  Negative for chest pain, palpitations and leg swelling.  Gastrointestinal: Negative.   Genitourinary:  Positive for frequency and urgency. Negative for decreased urine volume, dysuria, flank pain, hematuria and pelvic pain.  Neurological: Negative.   Psychiatric/Behavioral: Negative.      Per HPI unless specifically indicated above     Objective:    There were no vitals taken for this visit.  Wt Readings from Last 3 Encounters:  02/14/24 244 lb (110.7 kg)  02/08/24 245 lb 9.6 oz (111.4 kg)  11/08/23 262 lb 4.8 oz (119 kg)    Physical Exam Vitals and nursing note reviewed.  Constitutional:      General: She is awake. She is not in acute distress.    Appearance: She is well-developed. She is not ill-appearing.  HENT:     Head: Normocephalic.     Right Ear: Hearing normal.     Left Ear: Hearing normal.  Eyes:     General: Lids are normal.        Right eye: No discharge.        Left eye: No discharge.     Conjunctiva/sclera: Conjunctivae normal.  Pulmonary:  Effort: Pulmonary effort is normal. No accessory muscle usage or respiratory distress.  Musculoskeletal:     Cervical back: Normal range of motion.  Neurological:     Mental Status: She is alert and oriented to person, place, and time.  Psychiatric:        Attention and Perception: Attention normal.        Mood and Affect: Mood normal.        Behavior: Behavior normal. Behavior is cooperative.        Thought Content: Thought content normal.         Judgment: Judgment normal.     Results for orders placed or performed in visit on 02/29/24  Microscopic Examination   Collection Time: 02/29/24  1:27 PM   Urine  Result Value Ref Range   WBC, UA None seen 0 - 5 /hpf   RBC, Urine 0-2 0 - 2 /hpf   Epithelial Cells (non renal) 0-10 0 - 10 /hpf   Mucus, UA Present (A) Not Estab.   Bacteria, UA None seen None seen/Few  Urinalysis, Routine w reflex microscopic   Collection Time: 02/29/24  1:27 PM  Result Value Ref Range   Specific Gravity, UA CANCELED    pH, UA CANCELED    Color, UA Orange Yellow   Appearance Ur Clear Clear   Protein,UA CANCELED    Glucose, UA CANCELED    Ketones, UA CANCELED    Microscopic Examination See below:       Assessment & Plan:   Problem List Items Addressed This Visit       Genitourinary   Urinary tract infection - Primary   Acute since after having knee replacement, suspect may have had foley placement during surgery.  UA difficult to interpret due to use of Azo, will send for culture to further assess.  Due to recent surgery and age will start Augmentin  BID for 5 days, as higher risk for UTI.  Determine next steps after culture returns.  Educated patient on the plan.        Follow up plan: Return if symptoms worsen or fail to improve.

## 2024-03-02 ENCOUNTER — Other Ambulatory Visit: Payer: Self-pay

## 2024-03-02 DIAGNOSIS — M25561 Pain in right knee: Secondary | ICD-10-CM | POA: Diagnosis not present

## 2024-03-02 DIAGNOSIS — M25661 Stiffness of right knee, not elsewhere classified: Secondary | ICD-10-CM | POA: Diagnosis not present

## 2024-03-04 NOTE — Patient Instructions (Signed)
 Be Involved in Caring For Your Health:  Taking Medications When medications are taken as directed, they can greatly improve your health. But if they are not taken as prescribed, they may not work. In some cases, not taking them correctly can be harmful. To help ensure your treatment remains effective and safe, understand your medications and how to take them. Bring your medications to each visit for review by your provider.  Your lab results, notes, and after visit summary will be available on My Chart. We strongly encourage you to use this feature. If lab results are abnormal the clinic will contact you with the appropriate steps. If the clinic does not contact you assume the results are satisfactory. You can always view your results on My Chart. If you have questions regarding your health or results, please contact the clinic during office hours. You can also ask questions on My Chart.  We at [facility] are grateful that you chose [place] to provide your care. We strive to provide evidence-based and compassionate care and are always looking for feedback. If you get a survey from the clinic please complete this so we can hear your opinions.  Heart-Healthy Eating Plan Many factors influence your heart health, including eating and exercise habits. Heart health is also called coronary health. Coronary risk increases with abnormal blood fat (lipid) levels. A heart-healthy eating plan includes limiting unhealthy fats, increasing healthy fats, limiting salt (sodium) intake, and making other diet and lifestyle changes. What is my plan? Your health care provider may recommend that: You limit your fat intake to _________% or less of your total calories each day. You limit your saturated fat intake to _________% or less of your total calories each day. You limit the amount of cholesterol in your diet to less than _________ mg per day. You limit the amount of sodium in your diet to less than _________  mg per day. What are tips for following this plan? Cooking Cook foods using methods other than frying. Baking, boiling, grilling, and broiling are all good options. Other ways to reduce fat include: Removing the skin from poultry. Removing all visible fats from meats. Steaming vegetables in water or broth. Meal planning  At meals, imagine dividing your plate into fourths: Fill one-half of your plate with vegetables and green salads. Fill one-fourth of your plate with whole grains. Fill one-fourth of your plate with lean protein foods. Eat 2-4 cups of vegetables per day. One cup of vegetables equals 1 cup (91 g) broccoli or cauliflower florets, 2 medium carrots, 1 large bell pepper, 1 large sweet potato, 1 large tomato, 1 medium white potato, 2 cups (150 g) raw leafy greens. Eat 1-2 cups of fruit per day. One cup of fruit equals 1 small apple, 1 large banana, 1 cup (237 g) mixed fruit, 1 large orange,  cup (82 g) dried fruit, 1 cup (240 mL) 100% fruit juice. Eat more foods that contain soluble fiber. Examples include apples, broccoli, carrots, beans, peas, and barley. Aim to get 25-30 g of fiber per day. Increase your consumption of legumes, nuts, and seeds to 4-5 servings per week. One serving of dried beans or legumes equals  cup (90 g) cooked, 1 serving of nuts is  oz (12 almonds, 24 pistachios, or 7 walnut halves), and 1 serving of seeds equals  oz (8 g). Fats Choose healthy fats more often. Choose monounsaturated and polyunsaturated fats, such as olive and canola oils, avocado oil, flaxseeds, walnuts, almonds, and seeds. Eat  more omega-3 fats. Choose salmon, mackerel, sardines, tuna, flaxseed oil, and ground flaxseeds. Aim to eat fish at least 2 times each week. Check food labels carefully to identify foods with trans fats or high amounts of saturated fat. Limit saturated fats. These are found in animal products, such as meats, butter, and cream. Plant sources of saturated fats  include palm oil, palm kernel oil, and coconut oil. Avoid foods with partially hydrogenated oils in them. These contain trans fats. Examples are stick margarine, some tub margarines, cookies, crackers, and other baked goods. Avoid fried foods. General information Eat more home-cooked food and less restaurant, buffet, and fast food. Limit or avoid alcohol. Limit foods that are high in added sugar and simple starches such as foods made using white refined flour (white breads, pastries, sweets). Lose weight if you are overweight. Losing just 5-10% of your body weight can help your overall health and prevent diseases such as diabetes and heart disease. Monitor your sodium intake, especially if you have high blood pressure. Talk with your health care provider about your sodium intake. Try to incorporate more vegetarian meals weekly. What foods should I eat? Fruits All fresh, canned (in natural juice), or frozen fruits. Vegetables Fresh or frozen vegetables (raw, steamed, roasted, or grilled). Green salads. Grains Most grains. Choose whole wheat and whole grains most of the time. Rice and pasta, including brown rice and pastas made with whole wheat. Meats and other proteins Lean, well-trimmed beef, veal, pork, and lamb. Chicken and [place] without skin. All fish and shellfish. Wild duck, rabbit, pheasant, and venison. Egg whites or low-cholesterol egg substitutes. Dried beans, peas, lentils, and tofu. Seeds and most nuts. Dairy Low-fat or nonfat cheeses, including ricotta and mozzarella. Skim or 1% milk (liquid, powdered, or evaporated). Buttermilk made with low-fat milk. Nonfat or low-fat yogurt. Fats and oils Non-hydrogenated (trans-free) margarines. Vegetable oils, including soybean, sesame, sunflower, olive, avocado, peanut, safflower, corn, canola, and cottonseed. Salad dressings or mayonnaise made with a vegetable oil. Beverages Water (mineral or sparkling). Coffee and tea. Unsweetened ice  tea. Diet beverages. Sweets and desserts Sherbet, gelatin, and fruit ice. Small amounts of dark chocolate. Limit all sweets and desserts. Seasonings and condiments All seasonings and condiments. The items listed above may not be a complete list of foods and beverages you can eat. Contact a dietitian for more options. What foods should I avoid? Fruits Canned fruit in heavy syrup. Fruit in cream or butter sauce. Fried fruit. Limit coconut. Vegetables Vegetables cooked in cheese, cream, or butter sauce. Fried vegetables. Grains Breads made with saturated or trans fats, oils, or whole milk. Croissants. Sweet rolls. Donuts. High-fat crackers, such as cheese crackers and chips. Meats and other proteins Fatty meats, such as hot dogs, ribs, sausage, bacon, rib-eye roast or steak. High-fat deli meats, such as salami and bologna. Caviar. Domestic duck and goose. Organ meats, such as liver. Dairy Cream, sour cream, cream cheese, and creamed cottage cheese. Whole-milk cheeses. Whole or 2% milk (liquid, evaporated, or condensed). Whole buttermilk. Cream sauce or high-fat cheese sauce. Whole-milk yogurt. Fats and oils Meat fat, or shortening. Cocoa butter, hydrogenated oils, palm oil, coconut oil, palm kernel oil. Solid fats and shortenings, including bacon fat, salt pork, lard, and butter. Nondairy cream substitutes. Salad dressings with cheese or sour cream. Beverages Regular sodas and any drinks with added sugar. Sweets and desserts Frosting. Pudding. Cookies. Cakes. Pies. Milk chocolate or white chocolate. Buttered syrups. Full-fat ice cream or ice cream drinks. The items listed above may  not be a complete list of foods and beverages to avoid. Contact a dietitian for more information. Summary Heart-healthy meal planning includes limiting unhealthy fats, increasing healthy fats, limiting salt (sodium) intake and making other diet and lifestyle changes. Lose weight if you are overweight. Losing just  5-10% of your body weight can help your overall health and prevent diseases such as diabetes and heart disease. Focus on eating a balance of foods, including fruits and vegetables, low-fat or nonfat dairy, lean protein, nuts and legumes, whole grains, and heart-healthy oils and fats. This information is not intended to replace advice given to you by your health care provider. Make sure you discuss any questions you have with your health care provider. Document Revised: 06/29/2021 Document Reviewed: 06/29/2021 Elsevier Patient Education  2024 [employer].

## 2024-03-05 ENCOUNTER — Encounter: Payer: Self-pay | Admitting: Nurse Practitioner

## 2024-03-05 ENCOUNTER — Ambulatory Visit: Admitting: Nurse Practitioner

## 2024-03-05 ENCOUNTER — Other Ambulatory Visit: Payer: Self-pay

## 2024-03-05 VITALS — BP 112/63 | HR 67 | Ht 65.0 in | Wt 249.0 lb

## 2024-03-05 DIAGNOSIS — G2581 Restless legs syndrome: Secondary | ICD-10-CM

## 2024-03-05 DIAGNOSIS — N3000 Acute cystitis without hematuria: Secondary | ICD-10-CM

## 2024-03-05 DIAGNOSIS — E559 Vitamin D deficiency, unspecified: Secondary | ICD-10-CM

## 2024-03-05 DIAGNOSIS — I700 Atherosclerosis of aorta: Secondary | ICD-10-CM

## 2024-03-05 DIAGNOSIS — C9110 Chronic lymphocytic leukemia of B-cell type not having achieved remission: Secondary | ICD-10-CM | POA: Diagnosis not present

## 2024-03-05 DIAGNOSIS — E782 Mixed hyperlipidemia: Secondary | ICD-10-CM | POA: Diagnosis not present

## 2024-03-05 DIAGNOSIS — I890 Lymphedema, not elsewhere classified: Secondary | ICD-10-CM

## 2024-03-05 DIAGNOSIS — F5104 Psychophysiologic insomnia: Secondary | ICD-10-CM | POA: Diagnosis not present

## 2024-03-05 DIAGNOSIS — Z6841 Body Mass Index (BMI) 40.0 and over, adult: Secondary | ICD-10-CM | POA: Diagnosis not present

## 2024-03-05 DIAGNOSIS — I4819 Other persistent atrial fibrillation: Secondary | ICD-10-CM | POA: Diagnosis not present

## 2024-03-05 DIAGNOSIS — D6869 Other thrombophilia: Secondary | ICD-10-CM

## 2024-03-05 LAB — URINALYSIS, ROUTINE W REFLEX MICROSCOPIC
Bilirubin, UA: NEGATIVE
Glucose, UA: NEGATIVE
Ketones, UA: NEGATIVE
Leukocytes,UA: NEGATIVE
Nitrite, UA: NEGATIVE
Protein,UA: NEGATIVE
Specific Gravity, UA: 1.02 (ref 1.005–1.030)
Urobilinogen, Ur: 0.2 mg/dL (ref 0.2–1.0)
pH, UA: 5.5 (ref 5.0–7.5)

## 2024-03-05 LAB — MICROSCOPIC EXAMINATION
Bacteria, UA: NONE SEEN
WBC, UA: NONE SEEN /HPF (ref 0–5)

## 2024-03-05 NOTE — Assessment & Plan Note (Signed)
Chronic, ongoing.  Continue collaboration with vascular and compression pumps per them.

## 2024-03-05 NOTE — Progress Notes (Signed)
 BP 112/63 (BP Location: Left Arm, Patient Position: Sitting, Cuff Size: Large)   Pulse 67   Ht 5' 5 (1.651 m)   Wt 249 lb (112.9 kg)   SpO2 96%   BMI 41.44 kg/m    Subjective:    Patient ID: Erika Anderson, female    DOB: [birth year], 73 y.o.   MRN: [id number]  HPI: Erika Anderson is a 73 y.o. female  Chief Complaint  Patient presents with   mood   Hyperlipidemia   Hypertension   Urinary Tract Infection    Has been a week in symptoms    Treatment started on 03/01/24 for UTI, culture did not get sent. Taking Augmentin  at present.  ATRIAL FIBRILLATION & CLL Taking Metoprolol  and Eliquis .  Last visit with cardiology on 10/06/23.  Had ablations performed on 03/22/22 and 05/22/22.     Had CLL diagnosed on 04/01/22 with elevations noted on CBC.  Is followed by Dr. Babara.  Saw oncology last on 02/08/24.  Taking Zanubrutinib  160 MG BID.   Atrial fibrillation status: stable Satisfied with current treatment: yes  Medication side effects:  no Medication compliance: good compliance Etiology of atrial fibrillation: unknown Palpitations: no Chest pain: no Dyspnea on exertion: no Orthopnea: no Syncope:  no Edema: uses leg pumps for lymphedema Ventricular rate control: B-blocker Anti-coagulation: long acting    HYPERLIPIDEMIA Takes Rosuvastatin .  Takes Vitamin D  daily for history of low levels. Satisfied with current treatment? yes Duration of hyperlipidemia: chronic Cholesterol medication side effects: no Cholesterol supplements: none Medication compliance: good compliance Aspirin: no Recent stressors: no Recurrent headaches: no Visual changes: no Palpitations: no Dyspnea: no Chest pain: no Lower extremity edema: using leg pumps, lymphedema Dizzy/lightheaded: no  The 10-year ASCVD risk score (Arnett DK, et al., 2019) is: 11.1%   Values used to calculate the score:     Age: 89 years     Clincally relevant sex: Female     Is Non-Hispanic African American: No      Diabetic: No     Tobacco smoker: No     Systolic Blood Pressure: 112 mmHg     Is BP treated: Yes     HDL Cholesterol: 54 mg/dL     Total Cholesterol: 134 mg/dL  RESTLESS LEGS Continues on Requip  1 MG QID (on max dose).  History of sleep study but could not go to sleep, so was sent home.  Has not done a home study.  Has not taken Trazodone  in awhile, made her too sleepy. Takes Gabapentin  at night for back pain. Duration: chronic Discomfort description:  ill-defined Pain: no Location: lower legs Bilateral: yes Symmetric: yes Severity: mild Onset:  gradual Frequency:  intermittent Symptoms only occur while legs at rest: yes Sudden unintentional leg jerking: yes Bed partner bothered by leg movements: no LE numbness: no Decreased sensation: no Weakness: no Insomnia: yes Daytime somnolence: no Fatigue: no Alleviating factors: Requip  Aggravating factors: rest Status: stable Treatments attempted: as above noted  Relevant past medical, surgical, family and social history reviewed and updated as indicated. Interim medical history since our last visit reviewed. Allergies and medications reviewed and updated.  Review of Systems  Constitutional:  Negative for activity change, appetite change, diaphoresis, fatigue and fever.  Respiratory:  Negative for cough, chest tightness, shortness of breath and wheezing.   Cardiovascular:  Negative for chest pain, palpitations and leg swelling.  Gastrointestinal: Negative.   Musculoskeletal:  Positive for arthralgias.  Neurological: Negative.   Psychiatric/Behavioral: Negative.  Per HPI unless specifically indicated above     Objective:    BP 112/63 (BP Location: Left Arm, Patient Position: Sitting, Cuff Size: Large)   Pulse 67   Ht 5' 5 (1.651 m)   Wt 249 lb (112.9 kg)   SpO2 96%   BMI 41.44 kg/m   Wt Readings from Last 3 Encounters:  03/05/24 249 lb (112.9 kg)  02/14/24 244 lb (110.7 kg)  02/08/24 245 lb 9.6 oz (111.4 kg)     Physical Exam Vitals and nursing note reviewed.  Constitutional:      General: She is awake. She is not in acute distress.    Appearance: She is well-developed and well-groomed. She is obese. She is not ill-appearing or toxic-appearing.  HENT:     Head: Normocephalic.     Right Ear: Hearing normal.     Left Ear: Hearing normal.  Eyes:     General: Lids are normal.        Right eye: No discharge.        Left eye: No discharge.     Conjunctiva/sclera: Conjunctivae normal.     Pupils: Pupils are equal, round, and reactive to light.  Neck:     Thyroid : No thyromegaly.     Vascular: No carotid bruit.  Cardiovascular:     Rate and Rhythm: Normal rate and regular rhythm.     Heart sounds: Normal heart sounds. No murmur heard.    No gallop. No S3 or S4 sounds.     Comments: Antalgic gait, using walker.  Recent knee replacement. Pulmonary:     Effort: Pulmonary effort is normal. No accessory muscle usage or respiratory distress.     Breath sounds: Normal breath sounds.  Abdominal:     General: Bowel sounds are normal. There is no distension.     Palpations: Abdomen is soft.     Tenderness: There is no abdominal tenderness.  Musculoskeletal:     Cervical back: Normal range of motion and neck supple.     Right lower leg: 1+ Edema present.     Left lower leg: 1+ Edema present.  Lymphadenopathy:     Cervical: No cervical adenopathy.  Skin:    General: Skin is warm and dry.  Neurological:     Mental Status: She is alert and oriented to person, place, and time.  Psychiatric:        Attention and Perception: Attention normal.        Mood and Affect: Mood normal.        Speech: Speech normal.        Behavior: Behavior normal. Behavior is cooperative.        Thought Content: Thought content normal.     Results for orders placed or performed in visit on 02/29/24  Microscopic Examination   Collection Time: 02/29/24  1:27 PM   Urine  Result Value Ref Range   WBC, UA None seen 0 - 5  /hpf   RBC, Urine 0-2 0 - 2 /hpf   Epithelial Cells (non renal) 0-10 0 - 10 /hpf   Mucus, UA Present (A) Not Estab.   Bacteria, UA None seen None seen/Few  Urinalysis, Routine w reflex microscopic   Collection Time: 02/29/24  1:27 PM  Result Value Ref Range   Specific Gravity, UA CANCELED    pH, UA CANCELED    Color, UA Orange Yellow   Appearance Ur Clear Clear   Protein,UA CANCELED    Glucose, UA CANCELED  Ketones, UA CANCELED    Microscopic Examination See below:       Assessment & Plan:   Problem List Items Addressed This Visit       Cardiovascular and Mediastinum   Atrial fibrillation (HCC) (Chronic)   Ongoing, stable.  Ablation on 03/22/22 and 05/22/22.  Rate controlled. Continue current medication regimen and adjust as needed.  Continue to collaborate with cardiology, recent notes reviewed.  All questions answered.  Labs today: Lipid, remainder of labs up to date with oncology.      Aortic atherosclerosis (Chronic)   Ongoing.  Noted on imaging 05/03/18.  Continue statin for prevention and monitor closely.      Relevant Orders   Lipid Panel w/o Chol/HDL Ratio   Hypercoagulable state due to paroxysmal atrial fibrillation (HCC)   Chronic, ongoing, on Eliquis .  Continue to monitor.  CBC up to date with oncology.        Other   Vitamin D  deficiency (Chronic)   Chronic.  Noted on past labs, recommend continue Vitamin D3 2000 units daily, check level today.      Relevant Orders   VITAMIN D  25 Hydroxy (Vit-D Deficiency, Fractures)   RLS (restless legs syndrome) (Chronic)   Chronic and stable with Requip  and Gabapentin , continue this regimen and adjust as needed.  Monitor kidney function closely with medications.  Labs today: up to date.      Morbid obesity (HCC) (Chronic)   BMI 41.44 with HLD, A FIB. Recommended eating smaller high protein, low fat meals more frequently and exercising 30 mins a day 5 times a week with a goal of 10-15lb weight loss in the next 3  months. Patient voiced their understanding and motivation to adhere to these recommendations.  Would benefit from a GLP1 in future as needs to lose 20 pounds for knee surgery, but unsure insurance will not cover at present.       Insomnia (Chronic)   Chronic, stable.  Continue Trazodone  1/2 a pill in evening only as needed, currently uses Gabapentin  instead.  Do not take unless needed.  She wishes to continue minimal use.  Discussed mouth appliances for sleep aides. Would benefit home sleep study in future.      Hyperlipidemia (Chronic)   Chronic, ongoing.  Continue current medication regimen and adjust as needed.  Lipid panel today.      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  Diagnosed on 04/01/22.  Continue collaboration with oncology, recent notes reviewed.        BMI 40.0-44.9, adult (HCC) (Chronic)   Refer to morbid obesity plan of care.      Lymphedema   Chronic, ongoing.  Continue collaboration with vascular and compression pumps per them.      Other Visit Diagnoses       Acute cystitis without hematuria       Recheck today and adjust regimen as needed.   Relevant Orders   Urine Culture   Urinalysis, Routine w reflex microscopic        Follow up plan: Return in about 6 months (around 09/02/2024) for Annual Physical -- after 08/28/24.

## 2024-03-05 NOTE — Assessment & Plan Note (Signed)
 Refer to morbid obesity plan of care.

## 2024-03-05 NOTE — Assessment & Plan Note (Signed)
Chronic, on treatment.  Diagnosed on 04/01/22.  Continue collaboration with oncology, recent notes reviewed.

## 2024-03-05 NOTE — Assessment & Plan Note (Signed)
 Chronic, ongoing.  Continue current medication regimen and adjust as needed. Lipid panel today.

## 2024-03-05 NOTE — Assessment & Plan Note (Signed)
 Chronic and stable with Requip  and Gabapentin , continue this regimen and adjust as needed.  Monitor kidney function closely with medications.  Labs today: up to date.

## 2024-03-05 NOTE — Assessment & Plan Note (Signed)
Chronic, ongoing, on Eliquis.  Continue to monitor.  CBC up to date with oncology.

## 2024-03-05 NOTE — Assessment & Plan Note (Signed)
 Chronic.  Noted on past labs, recommend continue Vitamin D3 2000 units daily, check level today.

## 2024-03-05 NOTE — Assessment & Plan Note (Signed)
Ongoing.  Noted on imaging 05/03/18.  Continue statin for prevention and monitor closely.

## 2024-03-05 NOTE — Progress Notes (Signed)
 Specialty Pharmacy Refill Coordination Note  Erika Anderson is a 73 y.o. female contacted today regarding refills of specialty medication(s) Zanubrutinib  (Brukinsa )   Patient requested Delivery   Delivery date: 03/14/24   Verified address: [address] [place]   Medication will be filled on 10.07.25.

## 2024-03-05 NOTE — Assessment & Plan Note (Signed)
 Ongoing, stable.  Ablation on 03/22/22 and 05/22/22.  Rate controlled. Continue current medication regimen and adjust as needed.  Continue to collaborate with cardiology, recent notes reviewed.  All questions answered.  Labs today: Lipid, remainder of labs up to date with oncology.

## 2024-03-05 NOTE — Assessment & Plan Note (Signed)
 BMI 41.44 with HLD, A FIB. Recommended eating smaller high protein, low fat meals more frequently and exercising 30 mins a day 5 times a week with a goal of 10-15lb weight loss in the next 3 months. Patient voiced their understanding and motivation to adhere to these recommendations.  Would benefit from a GLP1 in future as needs to lose 20 pounds for knee surgery, but unsure insurance will not cover at present.

## 2024-03-05 NOTE — Assessment & Plan Note (Signed)
 Chronic, stable.  Continue Trazodone  1/2 a pill in evening only as needed, currently uses Gabapentin  instead.  Do not take unless needed.  She wishes to continue minimal use.  Discussed mouth appliances for sleep aides. Would benefit home sleep study in future.

## 2024-03-06 ENCOUNTER — Ambulatory Visit: Payer: Self-pay | Admitting: Nurse Practitioner

## 2024-03-06 DIAGNOSIS — Z96651 Presence of right artificial knee joint: Secondary | ICD-10-CM | POA: Diagnosis not present

## 2024-03-06 DIAGNOSIS — M25561 Pain in right knee: Secondary | ICD-10-CM | POA: Diagnosis not present

## 2024-03-06 DIAGNOSIS — M25661 Stiffness of right knee, not elsewhere classified: Secondary | ICD-10-CM | POA: Diagnosis not present

## 2024-03-06 LAB — LIPID PANEL W/O CHOL/HDL RATIO
Cholesterol, Total: 130 mg/dL (ref 100–199)
HDL: 46 mg/dL (ref >39)
LDL Chol Calc (NIH): 62 mg/dL (ref 0–99)
Triglycerides: 126 mg/dL (ref 0–149)
VLDL Cholesterol Cal: 22 mg/dL (ref 5–40)

## 2024-03-06 LAB — VITAMIN D 25 HYDROXY (VIT D DEFICIENCY, FRACTURES): Vit D, 25-Hydroxy: 31.6 ng/mL (ref 30.0–100.0)

## 2024-03-06 NOTE — Progress Notes (Signed)
 Contacted via MyChart  Good morning [name], labs have returned and are nice and normal.  No changes needed.  Great job!!  Any questions? Keep being incredible!!  Thank you for allowing me to participate in your care.  I appreciate you. Kindest regards, [name]

## 2024-03-07 LAB — URINE CULTURE

## 2024-03-07 NOTE — Progress Notes (Signed)
 Contacted via MyChart  Urine showing no infection, if discomfort continues please alert me and I can send in some Lidocaine  gel to place externally around urethra as there may be some irritation there.

## 2024-03-09 DIAGNOSIS — M25661 Stiffness of right knee, not elsewhere classified: Secondary | ICD-10-CM | POA: Diagnosis not present

## 2024-03-09 DIAGNOSIS — M25561 Pain in right knee: Secondary | ICD-10-CM | POA: Diagnosis not present

## 2024-03-12 DIAGNOSIS — M25661 Stiffness of right knee, not elsewhere classified: Secondary | ICD-10-CM | POA: Diagnosis not present

## 2024-03-12 DIAGNOSIS — M25561 Pain in right knee: Secondary | ICD-10-CM | POA: Diagnosis not present

## 2024-03-13 ENCOUNTER — Other Ambulatory Visit: Payer: Self-pay

## 2024-03-13 ENCOUNTER — Ambulatory Visit: Payer: Self-pay

## 2024-03-13 VITALS — Ht 65.0 in | Wt 247.0 lb

## 2024-03-13 DIAGNOSIS — Z0000 Encounter for general adult medical examination without abnormal findings: Secondary | ICD-10-CM | POA: Diagnosis not present

## 2024-03-13 NOTE — Patient Instructions (Addendum)
 Erika Anderson,  Thank you for taking the time for your Medicare Wellness Visit. I appreciate your continued commitment to your health goals. Please review the care plan we discussed, and feel free to reach out if I can assist you further.  Medicare recommends these wellness visits once per year to help you and your care team stay ahead of potential health issues. These visits are designed to focus on prevention, allowing your provider to concentrate on managing your acute and chronic conditions during your regular appointments.  Please note that Annual Wellness Visits do not include a physical exam. Some assessments may be limited, especially if the visit was conducted virtually. If needed, we may recommend a separate in-person follow-up with your provider.  Ongoing Care Seeing your primary care provider every 3 to 6 months helps us  monitor your health and provide consistent, personalized care.   Referrals If a referral was made during today's visit and you haven't received any updates within two weeks, please contact the referred provider directly to check on the status.  Recommended Screenings: Keep up the good work!  Health Maintenance  Topic Date Due   Breast Cancer Screening  02/01/2024   COVID-19 Vaccine (3 - Moderna risk series) 03/20/2024*   Medicare Annual Wellness Visit  03/13/2025   DTaP/Tdap/Td vaccine (2 - Td or Tdap) 12/29/2025   Colon Cancer Screening  08/25/2031   DEXA scan (bone density measurement)  01/29/2032   Pneumococcal Vaccine for age over 91  Completed   Flu Shot  Completed   Hepatitis C Screening  Completed   Zoster (Shingles) Vaccine  Completed   Meningitis B Vaccine  Aged Out  *Topic was postponed. The date shown is not the original due date.       03/13/2024    9:43 AM  Advanced Directives  Does Patient Have a Medical Advance Directive? No  Would patient like information on creating a medical advance directive? Yes (MAU/Ambulatory/Procedural Areas -  Information given)   Advance Care Planning is important because it: Ensures you receive medical care that aligns with your values, goals, and preferences. Provides guidance to your family and loved ones, reducing the emotional burden of decision-making during critical moments. Information on Advanced Care Planning can be found at [place]  Secret[employer] of [facility] Advance Health Care Directives Advance Health Care Directives ([email or web address]/)  You may also get the forms at your doctor's office.  Vision: Annual vision screenings are recommended for early detection of glaucoma, cataracts, and diabetic retinopathy. These exams can also reveal signs of chronic conditions such as diabetes and high blood pressure.  Dental: Annual dental screenings help detect early signs of oral cancer, gum disease, and other conditions linked to overall health, including heart disease and diabetes.  Please see the attached documents for additional preventive care recommendations.   Fall Prevention in the Home, Adult Falls can cause injuries and affect people of all ages. There are many simple things that you can do to make your home safe and to help prevent falls. If you need it, ask for help making these changes. What actions can I take to prevent falls? General information Use good lighting in all rooms. Make sure to: Replace any light bulbs that burn out. Turn on lights if it is dark and use night-lights. Keep items that you use often in easy-to-reach places. Lower the shelves around your home if needed. Move furniture so that there are clear paths around it. Do not keep throw rugs or other things on the  floor that can make you trip. If any of your floors are uneven, fix them. Add color or contrast paint or tape to clearly mark and help you see: Grab bars or handrails. First and last steps of staircases. Where the edge of each step is. If you use a ladder or stepladder: Make sure that it is fully opened. Do not  climb a closed ladder. Make sure the sides of the ladder are locked in place. Have someone hold the ladder while you use it. Know where your pets are as you move through your home. What can I do in the bathroom?     Keep the floor dry. Clean up any water that is on the floor right away. Remove soap buildup in the bathtub or shower. Buildup makes bathtubs and showers slippery. Use non-skid mats or decals on the floor of the bathtub or shower. Attach bath mats securely with double-sided, non-slip rug tape. If you need to sit down while you are in the shower, use a non-slip stool. Install grab bars by the toilet and in the bathtub and shower. Do not use towel bars as grab bars. What can I do in the bedroom? Make sure that you have a light by your bed that is easy to reach. Do not use any sheets or blankets on your bed that hang to the floor. Have a firm bench or chair with side arms that you can use for support when you get dressed. What can I do in the kitchen? Clean up any spills right away. If you need to reach something above you, use a sturdy step stool that has a grab bar. Keep electrical cables out of the way. Do not use floor polish or wax that makes floors slippery. What can I do with my stairs? Do not leave anything on the stairs. Make sure that you have a light switch at the top and the bottom of the stairs. Have them installed if you do not have them. Make sure that there are handrails on both sides of the stairs. Fix handrails that are broken or loose. Make sure that handrails are as long as the staircases. Install non-slip stair treads on all stairs in your home if they do not have carpet. Avoid having throw rugs at the top or bottom of stairs, or secure the rugs with carpet tape to prevent them from moving. Choose a carpet design that does not hide the edge of steps on the stairs. Make sure that carpet is firmly attached to the stairs. Fix any carpet that is loose or  worn. What can I do on the outside of my home? Use bright outdoor lighting. Repair the edges of walkways and driveways and fix any cracks. Clear paths of anything that can make you trip, such as tools or rocks. Add color or contrast paint or tape to clearly mark and help you see high doorway thresholds. Trim any bushes or trees on the main path into your home. Check that handrails are securely fastened and in good repair. Both sides of all steps should have handrails. Install guardrails along the edges of any raised decks or porches. Have leaves, snow, and ice cleared regularly. Use sand, salt, or ice melt on walkways during winter months if you live where there is ice and snow. In the garage, clean up any spills right away, including grease or oil spills. What other actions can I take? Review your medicines with your health care provider. Some medicines can make you  confused or feel dizzy. This can increase your chance of falling. Wear closed-toe shoes that fit well and support your feet. Wear shoes that have rubber soles and low heels. Use a cane, walker, scooter, or crutches that help you move around if needed. Talk with your provider about other ways that you can decrease your risk of falls. This may include seeing a physical therapist to learn to do exercises to improve movement and strength. Where to find more information Centers for Disease Control and Prevention, [employer]AD[employer]: TonerPromos.no [employer] on Aging: BaseRingTones.pl National Institute on Aging: BaseRingTones.pl Contact a health care provider if: You are afraid of falling at home. You feel weak, drowsy, or dizzy at home. You fall at home. Get help right away if you: Lose consciousness or have trouble moving after a fall. Have a fall that causes a head injury. These symptoms may be an emergency. Get help right away. Call 911. Do not wait to see if the symptoms will go away. Do not drive yourself to the hospital. This information is  not intended to replace advice given to you by your health care provider. Make sure you discuss any questions you have with your health care provider. Document Revised: 01/25/2022 Document Reviewed: 01/25/2022 Elsevier Patient Education  2024 Elsevier Inc.   Managing Pain Without Opioids Opioids are strong medicines used to treat moderate to severe pain. For some people, especially those who have long-term (chronic) pain, opioids may not be the best choice for pain management due to: Side effects like nausea, constipation, and sleepiness. The risk of addiction (opioid use disorder). The longer you take opioids, the greater your risk of addiction. Pain that lasts for more than 3 months is called chronic pain. Managing chronic pain usually requires more than one approach and is often provided by a team of health care providers working together (multidisciplinary approach). Pain management may be done at a pain management center or pain clinic. How to manage pain without the use of opioids Use non-opioid medicines Non-opioid medicines for pain may include: Over-the-counter or prescription non-steroidal anti-inflammatory drugs (NSAIDs). These may be the first medicines used for pain. They work well for muscle and bone pain, and they reduce swelling. Acetaminophen . This over-the-counter medicine may work well for milder pain but not swelling. Antidepressants. These may be used to treat chronic pain. A certain type of antidepressant (tricyclics) is often used. These medicines are given in lower doses for pain than when used for depression. Anticonvulsants. These are usually used to treat seizures but may also reduce nerve (neuropathic) pain. Muscle relaxants. These relieve pain caused by sudden muscle tightening (spasms). You may also use a pain medicine that is applied to the skin as a patch, cream, or gel (topical analgesic), such as a numbing medicine. These may cause fewer side effects than  medicines taken by mouth. Do certain therapies as directed Some therapies can help with pain management. They include: Physical therapy. You will do exercises to gain strength and flexibility. A physical therapist may teach you exercises to move and stretch parts of your body that are weak, stiff, or painful. You can learn these exercises at physical therapy visits and practice them at home. Physical therapy may also involve: Massage. Heat wraps or applying heat or cold to affected areas. Electrical signals that interrupt pain signals (transcutaneous electrical nerve stimulation, TENS). Weak lasers that reduce pain and swelling (low-level laser therapy). Signals from your body that help you learn to regulate pain (biofeedback). Occupational therapy. This  helps you to learn ways to function at home and work with less pain. Recreational therapy. This involves trying new activities or hobbies, such as a physical activity or drawing. Mental health therapy, including: Cognitive behavioral therapy (CBT). This helps you learn coping skills for dealing with pain. Acceptance and commitment therapy (ACT) to change the way you think and react to pain. Relaxation therapies, including muscle relaxation exercises and mindfulness-based stress reduction. Pain management counseling. This may be individual, family, or group counseling.  Receive medical treatments Medical treatments for pain management include: Nerve block injections. These may include a pain blocker and anti-inflammatory medicines. You may have injections: Near the spine to relieve chronic back or neck pain. Into joints to relieve back or joint pain. Into nerve areas that supply a painful area to relieve body pain. Into muscles (trigger point injections) to relieve some painful muscle conditions. A medical device placed near your spine to help block pain signals and relieve nerve pain or chronic back pain (spinal cord stimulation  device). Acupuncture. Follow these instructions at home Medicines Take over-the-counter and prescription medicines only as told by your health care provider. If you are taking pain medicine, ask your health care providers about possible side effects to watch out for. Do not drive or use heavy machinery while taking prescription opioid pain medicine. Lifestyle  Do not use drugs or alcohol to reduce pain. If you drink alcohol, limit how much you have to: 0-1 drink a day for women who are not pregnant. 0-2 drinks a day for men. Know how much alcohol is in a drink. In the [place] one drink equals one 12 oz bottle of beer (355 mL), one 5 oz glass of wine (148 mL), or one 1 oz glass of hard liquor (44 mL). Do not use any products that contain nicotine or tobacco. These products include cigarettes, chewing tobacco, and vaping devices, such as e-cigarettes. If you need help quitting, ask your health care provider. Eat a healthy diet and maintain a healthy weight. Poor diet and excess weight may make pain worse. Eat foods that are high in fiber. These include fresh fruits and vegetables, whole grains, and beans. Limit foods that are high in fat and processed sugars, such as fried and sweet foods. Exercise regularly. Exercise lowers stress and may help relieve pain. Ask your health care provider what activities and exercises are safe for you. If your health care provider approves, join an exercise class that combines movement and stress reduction. Examples include yoga and tai chi. Get enough sleep. Lack of sleep may make pain worse. Lower stress as much as possible. Practice stress reduction techniques as told by your therapist. General instructions Work with all your pain management providers to find the treatments that work best for you. You are an important member of your pain management team. There are many things you can do to reduce pain on your own. Consider joining an online or in-person  support group for people who have chronic pain. Keep all follow-up visits. This is important. Where to find more information You can find more information about managing pain without opioids from: American Academy of Pain Medicine: painmed.org Institute for Chronic Pain: instituteforchronicpain.org American Chronic Pain Association: theacpa.org Contact a health care provider if: You have side effects from pain medicine. Your pain gets worse or does not get better with treatments or home therapy. You are struggling with anxiety or depression. Summary Many types of pain can be managed without opioids. Chronic pain may  respond better to pain management without opioids. Pain is best managed when you and a team of health care providers work together. Pain management without opioids may include non-opioid medicines, medical treatments, physical therapy, mental health therapy, and lifestyle changes. Tell your health care providers if your pain gets worse or is not being managed well enough. This information is not intended to replace advice given to you by your health care provider. Make sure you discuss any questions you have with your health care provider. Document Revised: 09/03/2020 Document Reviewed: 09/03/2020 Elsevier Patient Education  2024 [employer].

## 2024-03-13 NOTE — Progress Notes (Signed)
 Subjective:   Erika Anderson is a 73 y.o. who presents for a Medicare Wellness preventive visit.  As a reminder, Annual Wellness Visits don't include a physical exam, and some assessments may be limited, especially if this visit is performed virtually. We may recommend an in-person follow-up visit with your provider if needed.  Visit Complete: Virtual I connected with  Erika Anderson on 03/13/24 by a video and audio enabled telemedicine application and verified that I am speaking with the correct person using two identifiers.  Patient Location: Home  Provider Location: Home Office  I discussed the limitations of evaluation and management by telemedicine. The patient expressed understanding and agreed to proceed.  Vital Signs: Because this visit was a virtual/telehealth visit, some criteria may be missing or patient reported. Any vitals not documented were not able to be obtained and vitals that have been documented are patient reported.    Persons Participating in Visit: Patient.  AWV Questionnaire: No: Patient Medicare AWV questionnaire was not completed prior to this visit.  Cardiac Risk Factors include: advanced age (>44men, >75 women);dyslipidemia;obesity (BMI >30kg/m2)     Objective:    Today's Vitals   03/13/24 0932  Weight: 247 lb (112 kg)  Height: 5' 5 (1.651 m)  PainSc: 5    Body mass index is 41.1 kg/m.     03/13/2024    9:43 AM 05/25/2023   10:23 AM 05/18/2023    7:31 AM 03/08/2023    9:48 AM 01/07/2023    9:51 AM 10/07/2022   11:07 AM 07/08/2022   10:20 AM  Advanced Directives  Does Patient Have a Medical Advance Directive? No No No No No No No  Would patient like information on creating a medical advance directive? Yes (MAU/Ambulatory/Procedural Areas - Information given) No - Patient declined Yes (MAU/Ambulatory/Procedural Areas - Information given) No - Patient declined No - Patient declined No - Patient declined No - Patient declined    Current  Medications (verified) Outpatient Encounter Medications as of 03/13/2024  Medication Sig   albuterol  (VENTOLIN  HFA) 108 (90 Base) MCG/ACT inhaler INHALE 2 PUFFS BY MOUTH EVERY 6 HOURS AS NEEDED FOR WHEEZING FOR SHORTNESS OF BREATH   apixaban  (ELIQUIS ) 5 MG TABS tablet Take 1 tablet (5 mg total) by mouth 2 (two) times daily.   betamethasone  dipropionate 0.05 % lotion Apply topically daily.   celecoxib (CELEBREX) 200 MG capsule Take 200 mg by mouth daily.   citalopram  (CELEXA ) 20 MG tablet Take 1 tablet (20 mg total) by mouth daily.   gabapentin  (NEURONTIN ) 300 MG capsule Take 2 capsules (600 mg total) by mouth at bedtime.   Iron -Vitamin C  65-125 MG TABS Take 1 tablet by mouth every other day.   ketoconazole (NIZORAL) 2 % shampoo Apply topically 3 (three) times a week.   metoprolol  tartrate (LOPRESSOR ) 25 MG tablet Take 1 tablet (25 mg total) by mouth 2 (two) times daily.   Multiple Vitamins-Minerals (MULTIVITAMIN WITH MINERALS) tablet Take 1 tablet by mouth daily.   ondansetron  (ZOFRAN -ODT) 4 MG disintegrating tablet Take 4 mg by mouth every 8 (eight) hours as needed.   oxyCODONE (OXY IR/ROXICODONE) 5 MG immediate release tablet Take 5 mg by mouth every 6 (six) hours as needed. for pain   pantoprazole  (PROTONIX ) 40 MG tablet Take 1 tablet (40 mg total) by mouth daily.   rOPINIRole  (REQUIP ) 1 MG tablet Take 1 tablet (1 mg total) by mouth 4 (four) times daily.   rosuvastatin  (CRESTOR ) 20 MG tablet Take 1 tablet (  20 mg total) by mouth daily.   traZODone  (DESYREL ) 50 MG tablet Take 1 tablet (50 mg total) by mouth at bedtime as needed for sleep.   zanubrutinib  (BRUKINSA ) 80 MG capsule Take 2 capsules (160 mg total) by mouth 2 (two) times daily.   oxyCODONE (OXY IR/ROXICODONE) 5 MG immediate release tablet Take 5 mg by mouth every 4 (four) hours as needed. (Patient not taking: Reported on 03/13/2024)   No facility-administered encounter medications on file as of 03/13/2024.    Allergies  (verified) Patient has no known allergies.   History: Past Medical History:  Diagnosis Date   Atrial fibrillation (HCC)    Chronic lymphocytic leukemia (HCC)    Grade II diastolic dysfunction    Hyperlipidemia    Leukemia (HCC)    Mild mitral regurgitation by prior echocardiogram    Obesity (BMI 30-39.9)    Restless legs syndrome (RLS) 12/28/2014   RLS (restless legs syndrome)    Past Surgical History:  Procedure Laterality Date   ATRIAL FIBRILLATION ABLATION N/A 03/22/2022   Procedure: ATRIAL FIBRILLATION ABLATION;  Surgeon: Cindie Ole DASEN, MD;  [facility];  Service: Cardiovascular;  Laterality: N/A;   CATARACT EXTRACTION W/PHACO Left 05/18/2023   Procedure: CATARACT EXTRACTION PHACO AND INTRAOCULAR LENS PLACEMENT (IOC) LEFT;  Surgeon: Mittie Gaskin, MD;  Location: [facility];  Service: Ophthalmology;  Laterality: Left;  6.95 0:44.8   CATARACT EXTRACTION W/PHACO Right 05/25/2023   Procedure: CATARACT EXTRACTION PHACO AND INTRAOCULAR LENS PLACEMENT (IOC) RIGHT 5.35 00:35.4;  Surgeon: Mittie Gaskin, MD;  Location: [facility];  Service: Ophthalmology;  Laterality: Right;   COLONOSCOPY WITH PROPOFOL  N/A 08/24/2021   Procedure: COLONOSCOPY WITH PROPOFOL ;  Surgeon: Therisa Bi, MD;  Location[facility] END[facility];  Service: Gastroenterology;  Laterality: N/A;   HEMORROIDECTOMY     TOTAL ABDOMINAL HYSTERECTOMY     ovaries remain   Family History  Problem Relation Age of Onset   Cancer Mother        ovarian   Dementia Father    Arthritis Sister    Heart disease Brother        MI   Stroke Paternal Grandmother    Cancer Paternal Grandfather        lung   Breast cancer Neg Hx    Social History   Socioeconomic History   Marital status: Married    Spouse name: Lemond   Number of children: 2   Years of education: 12   Highest education level: High school graduate  Occupational History   Occupation: retired  Tobacco Use   Smoking  status: Never   Smokeless tobacco: Never   Tobacco comments:    Never smoke 04/19/22  Vaping Use   Vaping status: Never Used  Substance and Sexual Activity   Alcohol use: No    Alcohol/week: 0.0 standard drinks of alcohol   Drug use: No   Sexual activity: Not Currently  Other Topics Concern   Not on file  Social History Narrative   Helps take care of grandchildren    Social Drivers of Health   Financial Resource Strain: Low Risk  (03/13/2024)   Overall Financial Resource Strain (CARDIA)    Difficulty of Paying Living Expenses: Not hard at all  Food Insecurity: No Food Insecurity (03/13/2024)   Hunger Vital Sign    Worried About Running Out of Food in the Last Year: Never true    Ran Out of Food in the Last Year: Never true  Transportation Needs: No  Transportation Needs (03/13/2024)   PRAPARE - Administrator, Civil Service (Medical): No    Lack of Transportation (Non-Medical): No  Physical Activity: Inactive (03/13/2024)   Exercise Vital Sign    Days of Exercise per Week: 0 days    Minutes of Exercise per Session: 0 min  Stress: No Stress Concern Present (03/13/2024)   [employer] of Occupational Health - Occupational Stress Questionnaire    Feeling of Stress: Not at all  Social Connections: Socially Integrated (03/13/2024)   Social Connection and Isolation Panel    Frequency of Communication with Friends and Family: More than three times a week    Frequency of Social Gatherings with Friends and Family: More than three times a week    Attends Religious Services: More than 4 times per year    Active Member of [employer] or Organizations: Yes    Attends Engineer, structural: More than 4 times per year    Marital Status: Married    Tobacco Counseling Counseling given: Not Answered Tobacco comments: Never smoke 04/19/22    Clinical Intake:  Pre-visit preparation completed: Yes  Pain : 0-10 Pain Score: 5  Pain Type: Chronic pain Pain Location:  Knee Pain Orientation: Right Pain Descriptors / Indicators: Aching     BMI - recorded: 41.1 Nutritional Status: BMI > 30  Obese Nutritional Risks: None Diabetes: No  Lab Results  Component Value Date   HGBA1C 5.3 01/13/2023   HGBA1C 5.0 07/30/2021     How often do you need to have someone help you when you read instructions, pamphlets, or other written materials from your doctor or pharmacy?: 1 - Never  Interpreter Needed?: No  Information entered by :: Vina Ned, CMA   Activities of Daily Living     03/13/2024    9:34 AM 05/18/2023    7:23 AM  In your present state of health, do you have any difficulty performing the following activities:  Hearing? 0 0  Vision? 0 0  Difficulty concentrating or making decisions? 0 0  Walking or climbing stairs? 1   Comment using rolling walker due to recent knee replacement surgery   Dressing or bathing? 0   Doing errands, shopping? 0   Preparing Food and eating ? N   Using the Toilet? N   In the past six months, have you accidently leaked urine? Y   Comment wears depends   Do you have problems with loss of bowel control? N   Managing your Medications? N   Managing your Finances? N   Housekeeping or managing your Housekeeping? N     Patient Care Team: Cannady, Jolene T, NP as PCP - General (Nurse Practitioner) Darliss Rogue, MD as PCP - Cardiology (Cardiology) Babara Call, MD as Consulting Physician (Oncology) Marea Selinda RAMAN, MD as Referring Physician (Vascular Surgery) Cathlyn Seal, MD as Referring Physician (Dermatology) Mittie Gaskin, MD as Referring Physician (Ophthalmology) Marchia Drivers, MD as Consulting Physician (Orthopedic Surgery)  I have updated your Care Teams any recent Medical Services you may have received from other providers in the past year.     Assessment:   This is a routine wellness examination for Erika Anderson.  Hearing/Vision screen Hearing Screening - Comments:: Denies hearing  loss  Vision Screening - Comments:: Gets routine eye exams, [facility]   Goals Addressed               This Visit's Progress     Weight (lb) < 200 lb (90.7  kg) (pt-stated)   247 lb (112 kg)      Depression Screen     03/13/2024    9:41 AM 03/01/2024    8:00 AM 02/14/2024   10:37 AM 08/29/2023    9:42 AM 06/21/2023    1:30 PM 06/16/2023    9:58 AM 03/08/2023    9:47 AM  PHQ 2/9 Scores  PHQ - 2 Score 0 0 0 1 0 0 0  PHQ- 9 Score 2 0 0 4 1 3  0    Fall Risk     03/13/2024    9:44 AM 03/01/2024    8:00 AM 08/29/2023    9:41 AM 06/21/2023    1:29 PM 03/08/2023    9:49 AM  Fall Risk   Falls in the past year? 0 0 0 0 0  Number falls in past yr: 0 0 0 0 0  Injury with Fall? 0 0 0 0 0  Risk for fall due to : Impaired balance/gait;Orthopedic patient;Impaired mobility No Fall Risks No Fall Risks No Fall Risks No Fall Risks  Follow up Falls evaluation completed;Education provided Falls evaluation completed Falls evaluation completed Falls prevention discussed;Falls evaluation completed Falls prevention discussed;Falls evaluation completed    MEDICARE RISK AT HOME:  Medicare Risk at Home Any stairs in or around the home?: Yes If so, are there any without handrails?: No Home free of loose throw rugs in walkways, pet beds, electrical cords, etc?: Yes Adequate lighting in your home to reduce risk of falls?: Yes Life alert?: No Use of a cane, walker or w/c?: Yes (currently using walker due to knee replacement surgery) Grab bars in the bathroom?: Yes Shower chair or bench in shower?: No Elevated toilet seat or a handicapped toilet?: Yes  TIMED UP AND GO:  Was the test performed?  No  Cognitive Function: 6CIT completed        03/13/2024    9:46 AM 03/08/2023    9:50 AM 03/02/2022    8:38 AM 02/27/2021    6:03 PM 02/22/2020    1:03 PM  6CIT Screen  What Year? 0 points 0 points 0 points 0 points 0 points  What month? 0 points 0 points 0 points 0 points 0 points  What  time? 0 points 0 points 0 points 0 points 0 points  Count back from 20 0 points 0 points 0 points 0 points 0 points  Months in reverse 0 points 2 points 0 points 0 points 0 points  Repeat phrase 0 points 0 points 0 points 0 points 2 points  Total Score 0 points 2 points 0 points 0 points 2 points    Immunizations Immunization History  Administered Date(s) Administered   Fluad Quad(high Dose 65+) 04/09/2019, 07/28/2020, 04/14/2022   INFLUENZA, HIGH DOSE SEASONAL PF 06/02/2021, 04/15/2023, 02/14/2024   Influenza,inj,Quad PF,6+ Mos 03/09/2016   Moderna Sars-Covid-2 Vaccination 07/04/2019, 08/01/2019   Pneumococcal Conjugate-13 01/11/2017   Pneumococcal Polysaccharide-23 01/12/2018   Tdap 12/30/2015   Zoster Recombinant(Shingrix ) 02/07/2019, 05/15/2019    Screening Tests Health Maintenance  Topic Date Due   Mammogram  02/01/2024   COVID-19 Vaccine (3 - Moderna risk series) 03/20/2024 (Originally 08/29/2019)   Medicare Annual Wellness (AWV)  03/13/2025   DTaP/Tdap/Td (2 - Td or Tdap) 12/29/2025   Colonoscopy  08/25/2031   DEXA SCAN  01/29/2032   Pneumococcal Vaccine: 50+ Years  Completed   Influenza Vaccine  Completed   Hepatitis C Screening  Completed   Zoster Vaccines- Shingrix   Completed  Meningococcal B Vaccine  Aged Out    Health Maintenance Items Addressed: See Nurse Notes at the end of this note  Additional Screening:  Vision Screening: Recommended annual ophthalmology exams for early detection of glaucoma and other disorders of the eye. Is the patient up to date with their annual eye exam?  Yes  Who is the provider or what is the name of the office in which the patient attends annual eye exams? [facility] [place]  Dental Screening: Recommended annual dental exams for proper oral hygiene  Community Resource Referral / Chronic Care Management: CRR required this visit?  No   CCM required this visit?  No   Plan:    I have personally reviewed and noted  the following in the patient's chart:   Medical and social history Use of alcohol, tobacco or illicit drugs  Current medications and supplements including opioid prescriptions. Patient is currently taking opioid prescriptions. Information provided to patient regarding non-opioid alternatives. Patient advised to discuss non-opioid treatment plan with their provider. Functional ability and status Nutritional status Physical activity Advanced directives List of other physicians Hospitalizations, surgeries, and ER visits in previous 12 months Vitals Screenings to include cognitive, depression, and falls Referrals and appointments  In addition, I have reviewed and discussed with patient certain preventive protocols, quality metrics, and best practice recommendations. A written personalized care plan for preventive services as well as general preventive health recommendations were provided to patient.   Vina Ned, CMA   03/13/2024   After Visit Summary: (MyChart) Due to this being a telephonic visit, the after visit summary with patients personalized plan was offered to patient via MyChart   Notes:  MMG scheduled 05/07/24 Declined Covid vaccine

## 2024-03-16 ENCOUNTER — Ambulatory Visit
Admission: RE | Admit: 2024-03-16 | Discharge: 2024-03-16 | Disposition: A | Discharge status: Home / Self Care | Source: Ambulatory Visit | Attending: Orthopedic Surgery | Admitting: Orthopedic Surgery

## 2024-03-16 ENCOUNTER — Other Ambulatory Visit: Payer: Self-pay | Admitting: Orthopedic Surgery

## 2024-03-16 DIAGNOSIS — M7989 Other specified soft tissue disorders: Secondary | ICD-10-CM | POA: Diagnosis not present

## 2024-03-16 DIAGNOSIS — M25561 Pain in right knee: Secondary | ICD-10-CM | POA: Diagnosis not present

## 2024-03-16 DIAGNOSIS — R2241 Localized swelling, mass and lump, right lower limb: Secondary | ICD-10-CM | POA: Diagnosis not present

## 2024-03-16 DIAGNOSIS — M25661 Stiffness of right knee, not elsewhere classified: Secondary | ICD-10-CM | POA: Diagnosis not present

## 2024-03-19 DIAGNOSIS — M25661 Stiffness of right knee, not elsewhere classified: Secondary | ICD-10-CM | POA: Diagnosis not present

## 2024-03-19 DIAGNOSIS — M25561 Pain in right knee: Secondary | ICD-10-CM | POA: Diagnosis not present

## 2024-03-23 HISTORY — PX: KNEE SURGERY: SHX244

## 2024-03-26 DIAGNOSIS — M25661 Stiffness of right knee, not elsewhere classified: Secondary | ICD-10-CM | POA: Diagnosis not present

## 2024-03-26 DIAGNOSIS — M25561 Pain in right knee: Secondary | ICD-10-CM | POA: Diagnosis not present

## 2024-03-28 ENCOUNTER — Encounter: Payer: Self-pay | Admitting: Licensed Clinical Social Worker

## 2024-03-28 ENCOUNTER — Inpatient Hospital Stay

## 2024-03-28 ENCOUNTER — Inpatient Hospital Stay: Attending: Oncology | Admitting: Licensed Clinical Social Worker

## 2024-03-28 DIAGNOSIS — C9110 Chronic lymphocytic leukemia of B-cell type not having achieved remission: Secondary | ICD-10-CM | POA: Insufficient documentation

## 2024-03-28 DIAGNOSIS — Z8041 Family history of malignant neoplasm of ovary: Secondary | ICD-10-CM | POA: Insufficient documentation

## 2024-03-28 DIAGNOSIS — Z1379 Encounter for other screening for genetic and chromosomal anomalies: Secondary | ICD-10-CM | POA: Diagnosis not present

## 2024-03-28 NOTE — Progress Notes (Signed)
 REFERRING PROVIDER: Babara Call, MD [address] [place],  [place] [address]  PRIMARY PROVIDER:  Babara Call, MD  PRIMARY REASON FOR VISIT:  1. CLL (chronic lymphocytic leukemia) (HCC)   2. Family history of ovarian cancer      HISTORY OF PRESENT ILLNESS:   Erika Anderson, a 73 y.o. female, was seen for a [place] cancer genetics consultation at the request of Dr. Babara due to a family history of ovarian cancer.  Erika Anderson presents to clinic today to discuss the possibility of a hereditary predisposition to cancer, genetic testing, and to further clarify her future cancer risks, as well as potential cancer risks for family members.   CANCER HISTORY:  Oncology History  CLL (chronic lymphocytic leukemia) (HCC)  03/02/2022 Initial Diagnosis   CLL (chronic lymphocytic leukemia)  -03/15/22 Flowcytometry showed chronic lymphocytic leukemia, B cell, CD38 expression indeterminate for prognosis (23%) and CD20 positive.  45% nuclei positive for TP53 gene deletion.    03/26/2022 Imaging   US  abdomen 1. No sonographic etiology for night sweats identified. 2. Spleen is at the upper limits of normal in size    04/01/2022 Cancer Staging   Staging form: Chronic Lymphocytic Leukemia / Small Lymphocytic Lymphoma, AJCC 8th Edition - Clinical: Modified Rai Stage 0 (Modified Rai risk: Low, Lymphocytosis: Present, Adenopathy: Absent, Organomegaly: Absent, Anemia: Absent, Thrombocytopenia: Absent) - Signed by Babara Call, MD on 04/01/2022 Stage prefix: Initial diagnosis Absolute lymphocyte count (ALC) (cells/uL): 18   04/19/2022 -  Chemotherapy   Started on Zanubrutinib  160mg  BID    In 2023, at the age of 22, Erika Anderson was diagnosed with CLL.  RELEVANT MEDICAL HISTORY:  Menarche was at age 29.  First live birth at age 72.  Ovaries intact: yes.  Hysterectomy: yes.  Menopausal status: postmenopausal.  Colonoscopy: yes; normal. Mammogram within the last year: yes. Number of breast biopsies:  0.   Past Medical History:  Diagnosis Date   Atrial fibrillation (HCC)    Chronic lymphocytic leukemia (HCC)    Grade II diastolic dysfunction    Hyperlipidemia    Leukemia (HCC)    Mild mitral regurgitation by prior echocardiogram    Obesity (BMI 30-39.9)    Restless legs syndrome (RLS) 12/28/2014   RLS (restless legs syndrome)     Past Surgical History:  Procedure Laterality Date   ATRIAL FIBRILLATION ABLATION N/A 03/22/2022   Procedure: ATRIAL FIBRILLATION ABLATION;  Surgeon: Cindie Ole DASEN, MD;  Location: [facility];  Service: Cardiovascular;  Laterality: N/A;   CATARACT EXTRACTION W/PHACO Left 05/18/2023   Procedure: CATARACT EXTRACTION PHACO AND INTRAOCULAR LENS PLACEMENT (IOC) LEFT;  Surgeon: Mittie Gaskin, MD;  Location: [facility];  Service: Ophthalmology;  Laterality: Left;  6.95 0:44.8   CATARACT EXTRACTION W/PHACO Right 05/25/2023   Procedure: CATARACT EXTRACTION PHACO AND INTRAOCULAR LENS PLACEMENT (IOC) RIGHT 5.35 00:35.4;  Surgeon: Mittie Gaskin, MD;  Location: [facility];  Service: Ophthalmology;  Laterality: Right;   COLONOSCOPY WITH PROPOFOL  N/A 08/24/2021   Procedure: COLONOSCOPY WITH PROPOFOL ;  Surgeon: Therisa Bi, MD;  Location: [facility] ENDOSCOPY;  Service: Gastroenterology;  Laterality: N/A;   HEMORROIDECTOMY     TOTAL ABDOMINAL HYSTERECTOMY     ovaries remain    FAMILY HISTORY:  We obtained a detailed, 4-generation family history.  Significant diagnoses are listed below: Family History  Problem Relation Age of Onset   Ovarian cancer Mother 33   Dementia Father    Arthritis Sister    Heart disease Brother  MI   Stroke Paternal Grandmother    Breast cancer Neg Hx    Erika Anderson has 1 son, 63 and 1 daughter, 86 who reportedly has had negative genetic testing for hereditary cancer. She had 1 brother who passed at 71 and 1 sister living at 3 who has not had genetic testing.   Erika Anderson mother had  ovarian cancer at 89 and passed at 1.  No other known cancers on this side of the family.  Erika Anderson father passed at 93 of dementia. Patient has 2 paternal cousins who had cancer, one had breast cancer over age 57 and patient is unsure of the cancer type for the other cousin.   Erika Anderson is unaware of previous family history of genetic testing for hereditary cancer risks. There is no reported Ashkenazi Jewish ancestry. There is no known consanguinity.    GENETIC COUNSELING ASSESSMENT: Erika Anderson is a 73 y.o. female with a family history of ovarian cancer which is somewhat suggestive of a hereditary cancer syndrome and predisposition to cancer. We, therefore, discussed and recommended the following at today's visit.   DISCUSSION: We discussed that, in general, most cancer is not inherited in families, but instead is sporadic or familial. Sporadic cancers occur by chance and typically happen at older ages (>50 years). We discussed that approximately 10-20% of ovarian cancer is hereditary. Most cases of hereditary ovarian cancer are associated with BRCA1/BRCA2 genes, although there are other genes associated with hereditary cancer as well. Cancers and risks are gene specific. We discussed that testing is beneficial for several reasons including knowing about cancer risks, identifying potential screening and risk-reduction options that may be appropriate, and to understand if other family members could be at risk for cancer and allow them to undergo genetic testing.   We reviewed the characteristics, features and inheritance patterns of hereditary cancer syndromes. We also discussed genetic testing, including the appropriate family members to test, the process of testing, insurance coverage and turn-around-time for results. We discussed the implications of a negative, positive and/or variant of uncertain significant result. We recommended Erika Anderson pursue genetic testing for the [facility]  CancerNext-Expanded+RNA gene panel, however this would need to be done via skin punch biopsy given her diagnosis of CLL.   Based on Erika Anderson's family history of cancer, she meets medical criteria for genetic testing. Despite that she meets criteria, she may still have an out of pocket cost.   PLAN: After considering the risks, benefits, and limitations,  Erika Anderson did not wish to pursue genetic testing at today's visit. We understand this decision and remain available to coordinate genetic testing at any time in the future. We, therefore, recommend Erika Anderson continue to follow the cancer screening guidelines given by her primary healthcare provider.  Erika Anderson questions were answered to her satisfaction today. Our contact information was provided should additional questions or concerns arise. Thank you for the referral and allowing us  to share in the care of your patient.   Dena Cary, MS, [facility] Genetic Counselor [email or web address] Phone: [phone]  I personally spent a total of 40 minutes in the care of the patient today including counseling and educating and documenting clinical information in the EHR.  _______________________________________________________________________ For Office Staff:  Number of people involved in session: 1 Was an Intern/ student involved with case: no

## 2024-03-30 DIAGNOSIS — M25561 Pain in right knee: Secondary | ICD-10-CM | POA: Diagnosis not present

## 2024-03-30 DIAGNOSIS — M25661 Stiffness of right knee, not elsewhere classified: Secondary | ICD-10-CM | POA: Diagnosis not present

## 2024-04-02 DIAGNOSIS — M25661 Stiffness of right knee, not elsewhere classified: Secondary | ICD-10-CM | POA: Diagnosis not present

## 2024-04-02 DIAGNOSIS — M25561 Pain in right knee: Secondary | ICD-10-CM | POA: Diagnosis not present

## 2024-04-05 ENCOUNTER — Other Ambulatory Visit: Payer: Self-pay

## 2024-04-05 NOTE — Progress Notes (Signed)
 Specialty Pharmacy Refill Coordination Note  Erika Anderson is a 73 y.o. female contacted today regarding refills of specialty medication(s) Zanubrutinib  (Brukinsa )   Patient requested Delivery   Delivery date: 04/17/24   Verified address: [address] [place]. [phone]   Medication will be filled on: 04/16/24   Sent patient mychart message with anticipated delivery date.

## 2024-04-06 DIAGNOSIS — M25661 Stiffness of right knee, not elsewhere classified: Secondary | ICD-10-CM | POA: Diagnosis not present

## 2024-04-06 DIAGNOSIS — M25561 Pain in right knee: Secondary | ICD-10-CM | POA: Diagnosis not present

## 2024-04-09 ENCOUNTER — Other Ambulatory Visit: Payer: Self-pay | Admitting: Nurse Practitioner

## 2024-04-09 DIAGNOSIS — M25661 Stiffness of right knee, not elsewhere classified: Secondary | ICD-10-CM | POA: Diagnosis not present

## 2024-04-09 DIAGNOSIS — Z1231 Encounter for screening mammogram for malignant neoplasm of breast: Secondary | ICD-10-CM

## 2024-04-09 DIAGNOSIS — M25561 Pain in right knee: Secondary | ICD-10-CM | POA: Diagnosis not present

## 2024-04-13 ENCOUNTER — Other Ambulatory Visit: Payer: Self-pay

## 2024-04-13 ENCOUNTER — Other Ambulatory Visit: Payer: Self-pay | Admitting: Pharmacist

## 2024-04-13 ENCOUNTER — Telehealth: Payer: Self-pay | Admitting: Pharmacy Technician

## 2024-04-13 DIAGNOSIS — M25561 Pain in right knee: Secondary | ICD-10-CM | POA: Diagnosis not present

## 2024-04-13 DIAGNOSIS — M25661 Stiffness of right knee, not elsewhere classified: Secondary | ICD-10-CM | POA: Diagnosis not present

## 2024-04-13 DIAGNOSIS — C9110 Chronic lymphocytic leukemia of B-cell type not having achieved remission: Secondary | ICD-10-CM

## 2024-04-13 MED ORDER — ZANUBRUTINIB 160 MG PO TABS
160.0000 mg | ORAL_TABLET | Freq: Two times a day (BID) | ORAL | 0 refills | Status: DC
  Filled 2024-04-13 (×2): qty 60, 30d supply, fill #0

## 2024-04-13 NOTE — Telephone Encounter (Signed)
 Brukinsa  is changing from capsules to tablets.  New prescription sent for Brukinsa  tablets to patient's pharmacy.

## 2024-04-13 NOTE — Progress Notes (Signed)
 Notified patient of brukinsa  formulation change. Patient previously taking 80mg  caps x 2 bid. Informed patient Brukinsa  is now 160mg  tabs and new rx directions are 160mg  (1) bid. Offered patient rph consult and she declined.

## 2024-04-13 NOTE — Telephone Encounter (Signed)
 Oral Oncology Patient Advocate Encounter  Was successful in securing patient a $8000 grant from [facility] to provide copayment coverage for Brukinsa .  This will keep the out of pocket expense at $0.     Healthwell ID: [id number]   The billing information is as follows and has been shared with [facility].    RxBin: [id number] PCN: [id number] Member ID: [id number] Group ID: [id number] Dates of Eligibility: 04/01/2024 through 03/31/2025  Fund:  Chronic Lymphocytic Leukemia  Nishka Heide (Patty) Chet Burnet, CPhT  [facility], Zelda Salmon, [facility] - Oral Chemotherapy Patient Advocate Specialist III Phone: [phone]  Fax: [phone]

## 2024-04-16 ENCOUNTER — Other Ambulatory Visit: Payer: Self-pay

## 2024-04-20 DIAGNOSIS — M25661 Stiffness of right knee, not elsewhere classified: Secondary | ICD-10-CM | POA: Diagnosis not present

## 2024-04-20 DIAGNOSIS — M25561 Pain in right knee: Secondary | ICD-10-CM | POA: Diagnosis not present

## 2024-04-26 ENCOUNTER — Ambulatory Visit: Admitting: Pediatrics

## 2024-04-26 ENCOUNTER — Encounter: Payer: Self-pay | Admitting: Pediatrics

## 2024-04-26 VITALS — BP 130/67 | HR 65 | Temp 97.1°F | Ht 65.0 in | Wt 245.0 lb

## 2024-04-26 DIAGNOSIS — R233 Spontaneous ecchymoses: Secondary | ICD-10-CM

## 2024-04-26 DIAGNOSIS — I8393 Asymptomatic varicose veins of bilateral lower extremities: Secondary | ICD-10-CM

## 2024-04-26 DIAGNOSIS — Z7901 Long term (current) use of anticoagulants: Secondary | ICD-10-CM

## 2024-04-26 NOTE — Patient Instructions (Signed)
 Your bruise is probably from the eliquis  (this is a common side effect), the hardened part is a small hematoma that will go away on its own.  For you vericose veins, please schedule follow up with the vein specialist  If symptoms return, worsen or become more frequent please schedule a follow up visit

## 2024-04-26 NOTE — Progress Notes (Signed)
 Office Visit  BP 130/67   Pulse 65   Temp (!) 97.1 F (36.2 C) (Oral)   Ht 5' 5 (1.651 m)   Wt 245 lb (111.1 kg)   SpO2 97%   BMI 40.77 kg/m    Subjective:    Patient ID: Erika Anderson, female    DOB: [birth year], 73 y.o.   MRN: [id number]  HPI: Erika Anderson is a 73 y.o. female  Chief Complaint  Patient presents with   Bleeding/Bruising    Patient states she has noticed some unusual bruising in her L leg near her calf. States the area is painful and states she feels a knot in the area as well. States she does not remember hitting her leg in that area and first noticed it this past weekend.     Discussed the use of AI scribe software for clinical note transcription with the patient, who gave verbal consent to proceed.  History of Present Illness   Erika Anderson is a 73 year old female on Eliquis  who presents with a spontaneous bruise on her leg.  She noticed a bruise on her leg over the past weekend while sitting in a recliner. She felt a knot, and her husband observed a large bruise. She does not recall any trauma to the area. She has been on Eliquis  for a significant period, and this is the first occurrence of such bruising while on the medication. No history of similar bruising while on Eliquis .  She has varicose veins in her legs, which are painful and burn, particularly on one side. She describes her veins as 'so bad' and experiences pain due to recent surgery on the 17th of the month, which is still tender. She has previously consulted with vascular specialists about her varicose veins, but was told they were not severe enough for intervention at that time.  She has a history of lymphedema, which she manages with compression stockings. Her hematology follow-ups have been stable, and she regularly sees a hematologist.     Relevant past medical, surgical, family and social history reviewed and updated as indicated. Interim medical history since our last visit  reviewed. Allergies and medications reviewed and updated.  ROS per HPI unless specifically indicated above     Objective:    BP 130/67   Pulse 65   Temp (!) 97.1 F (36.2 C) (Oral)   Ht 5' 5 (1.651 m)   Wt 245 lb (111.1 kg)   SpO2 97%   BMI 40.77 kg/m   Wt Readings from Last 3 Encounters:  04/26/24 245 lb (111.1 kg)  03/13/24 247 lb (112 kg)  03/05/24 249 lb (112.9 kg)     Physical Exam Constitutional:      Appearance: Normal appearance.  Pulmonary:     Effort: Pulmonary effort is normal.  Musculoskeletal:        General: Normal range of motion.  Skin:    Comments: Right lower leg with raised vericose veins, not ttp or warm. Her left calf did has a healing bruise with a small hardened area (not painful)  Neurological:     General: No focal deficit present.     Mental Status: She is alert. Mental status is at baseline.  Psychiatric:        Mood and Affect: Mood normal.        Behavior: Behavior normal.        Thought Content: Thought content normal.         03/13/2024  9:41 AM 03/01/2024    8:00 AM 02/14/2024   10:37 AM 08/29/2023    9:42 AM 06/21/2023    1:30 PM  Depression screen PHQ 2/9  Decreased Interest 0 0 0 1 0  Down, Depressed, Hopeless 0 0 0 0 0  PHQ - 2 Score 0 0 0 1 0  Altered sleeping 1 0 0 1 1  Tired, decreased energy 1 0 0 1 0  Change in appetite 0 0 0 1 0  Feeling bad or failure about yourself  0 0 0 0 0  Trouble concentrating 0 0 0 0 0  Moving slowly or fidgety/restless 0 0 0 0 0  Suicidal thoughts 0 0 0 0 0  PHQ-9 Score 2  0  0  4  1   Difficult doing work/chores Not difficult at all   Somewhat difficult Not difficult at all     Data saved with a previous flowsheet row definition       03/01/2024    8:00 AM 02/14/2024   10:37 AM 08/29/2023    9:42 AM 06/21/2023    1:30 PM  GAD 7 : Generalized Anxiety Score  Nervous, Anxious, on Edge 0 0 0 0  Control/stop worrying 0 0 0 0  Worry too much - different things 0 0 0 0  Trouble relaxing 0  0 1 0  Restless 0 0 1 0  Easily annoyed or irritable 0 0 0 0  Afraid - awful might happen 0 0 0 0  Total GAD 7 Score 0 0 2 0  Anxiety Difficulty   Not difficult at all Not difficult at all       Assessment & Plan:  Assessment & Plan   Bruising, spontaneous Anticoagulated on Eliquis  Small, healing hematoma likely due to Eliquis .   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 Exam very reassuring. - Continue massaging hematoma. - Apply warm compresses and elevate leg. - Monitor for recurrence or persistence. - Consider coagulation profile if frequent or unresolved.  Varicose veins of both lower extremities, unspecified whether complicated Painful varicose veins, possibly worsened by lymphedema and recent surgery on the right, no infection. Previous consultation deemed treatment unnecessary, but symptoms suggest reevaluation. - Will see her vascular team for evaluation and potential treatment. - Continue wearing compression stockings.    Follow up plan: Return if symptoms worsen or fail to improve.  Hadassah SHAUNNA Nett, MD

## 2024-05-07 ENCOUNTER — Encounter

## 2024-05-08 ENCOUNTER — Other Ambulatory Visit: Payer: Self-pay

## 2024-05-08 ENCOUNTER — Other Ambulatory Visit: Payer: Self-pay | Admitting: Oncology

## 2024-05-08 DIAGNOSIS — C9110 Chronic lymphocytic leukemia of B-cell type not having achieved remission: Secondary | ICD-10-CM

## 2024-05-09 ENCOUNTER — Other Ambulatory Visit: Payer: Self-pay

## 2024-05-11 ENCOUNTER — Other Ambulatory Visit: Payer: Self-pay

## 2024-05-11 ENCOUNTER — Other Ambulatory Visit (HOSPITAL_COMMUNITY): Payer: Self-pay

## 2024-05-11 NOTE — Progress Notes (Signed)
 Specialty Pharmacy Refill Coordination Note  Erika Anderson is a 73 y.o. female contacted today regarding refills of specialty medication(s) Zanubrutinib  (BRUKINSA )   Patient requested Delivery   Delivery date: 05/16/24   Verified address: [address] [facility]. [phone]   Medication will be filled on: 05/15/24  This fill date is pending response to refill request from provider. Patient is aware and if they have not received fill by intended date they must follow up with pharmacy.

## 2024-05-13 ENCOUNTER — Other Ambulatory Visit: Payer: Self-pay | Admitting: Cardiology

## 2024-05-14 ENCOUNTER — Inpatient Hospital Stay (HOSPITAL_BASED_OUTPATIENT_CLINIC_OR_DEPARTMENT_OTHER): Admitting: Oncology

## 2024-05-14 ENCOUNTER — Other Ambulatory Visit: Payer: Self-pay

## 2024-05-14 ENCOUNTER — Other Ambulatory Visit: Attending: Oncology

## 2024-05-14 ENCOUNTER — Encounter: Payer: Self-pay | Admitting: Oncology

## 2024-05-14 ENCOUNTER — Ambulatory Visit: Payer: Self-pay | Admitting: Oncology

## 2024-05-14 VITALS — BP 139/65 | HR 61 | Temp 97.6°F | Resp 20 | Wt 247.8 lb

## 2024-05-14 DIAGNOSIS — C9110 Chronic lymphocytic leukemia of B-cell type not having achieved remission: Secondary | ICD-10-CM | POA: Insufficient documentation

## 2024-05-14 DIAGNOSIS — Z90710 Acquired absence of both cervix and uterus: Secondary | ICD-10-CM | POA: Insufficient documentation

## 2024-05-14 DIAGNOSIS — I4891 Unspecified atrial fibrillation: Secondary | ICD-10-CM | POA: Diagnosis not present

## 2024-05-14 DIAGNOSIS — Z79899 Other long term (current) drug therapy: Secondary | ICD-10-CM | POA: Diagnosis not present

## 2024-05-14 DIAGNOSIS — Z823 Family history of stroke: Secondary | ICD-10-CM | POA: Diagnosis not present

## 2024-05-14 DIAGNOSIS — E785 Hyperlipidemia, unspecified: Secondary | ICD-10-CM | POA: Diagnosis not present

## 2024-05-14 DIAGNOSIS — G2581 Restless legs syndrome: Secondary | ICD-10-CM | POA: Insufficient documentation

## 2024-05-14 DIAGNOSIS — Z8261 Family history of arthritis: Secondary | ICD-10-CM | POA: Insufficient documentation

## 2024-05-14 DIAGNOSIS — Z5111 Encounter for antineoplastic chemotherapy: Secondary | ICD-10-CM

## 2024-05-14 DIAGNOSIS — Z9842 Cataract extraction status, left eye: Secondary | ICD-10-CM | POA: Diagnosis not present

## 2024-05-14 DIAGNOSIS — Z1379 Encounter for other screening for genetic and chromosomal anomalies: Secondary | ICD-10-CM | POA: Insufficient documentation

## 2024-05-14 DIAGNOSIS — Z7901 Long term (current) use of anticoagulants: Secondary | ICD-10-CM | POA: Insufficient documentation

## 2024-05-14 DIAGNOSIS — Z8249 Family history of ischemic heart disease and other diseases of the circulatory system: Secondary | ICD-10-CM | POA: Insufficient documentation

## 2024-05-14 DIAGNOSIS — Z9841 Cataract extraction status, right eye: Secondary | ICD-10-CM | POA: Diagnosis not present

## 2024-05-14 DIAGNOSIS — Z8041 Family history of malignant neoplasm of ovary: Secondary | ICD-10-CM | POA: Diagnosis not present

## 2024-05-14 DIAGNOSIS — E669 Obesity, unspecified: Secondary | ICD-10-CM | POA: Insufficient documentation

## 2024-05-14 LAB — CMP (CANCER CENTER ONLY)
ALT: 9 U/L (ref 0–44)
AST: 17 U/L (ref 15–41)
Albumin: 4.1 g/dL (ref 3.5–5.0)
Alkaline Phosphatase: 89 U/L (ref 38–126)
Anion gap: 10 (ref 5–15)
BUN: 17 mg/dL (ref 8–23)
CO2: 27 mmol/L (ref 22–32)
Calcium: 9.4 mg/dL (ref 8.9–10.3)
Chloride: 104 mmol/L (ref 98–111)
Creatinine: 0.96 mg/dL (ref 0.44–1.00)
GFR, Estimated: >60 mL/min (ref >60)
Glucose, Bld: 95 mg/dL (ref 70–99)
Potassium: 4.5 mmol/L (ref 3.5–5.1)
Sodium: 140 mmol/L (ref 135–145)
Total Bilirubin: 0.3 mg/dL (ref 0.0–1.2)
Total Protein: 6.8 g/dL (ref 6.5–8.1)

## 2024-05-14 LAB — CBC WITH DIFFERENTIAL (CANCER CENTER ONLY)
Abs Immature Granulocytes: 0.03 K/uL (ref 0.00–0.07)
Basophils Absolute: 0.1 K/uL (ref 0.0–0.1)
Basophils Relative: 1 %
Eosinophils Absolute: 0.2 K/uL (ref 0.0–0.5)
Eosinophils Relative: 2 %
HCT: 39 % (ref 36.0–46.0)
Hemoglobin: 13 g/dL (ref 12.0–15.0)
Immature Granulocytes: 0 %
Lymphocytes Relative: 37 %
Lymphs Abs: 3.1 K/uL (ref 0.7–4.0)
MCH: 28.7 pg (ref 26.0–34.0)
MCHC: 33.3 g/dL (ref 30.0–36.0)
MCV: 86.1 fL (ref 80.0–100.0)
Monocytes Absolute: 0.7 K/uL (ref 0.1–1.0)
Monocytes Relative: 8 %
Neutro Abs: 4.4 K/uL (ref 1.7–7.7)
Neutrophils Relative %: 52 %
Platelet Count: 247 K/uL (ref 150–400)
RBC: 4.53 MIL/uL (ref 3.87–5.11)
RDW: 13.2 % (ref 11.5–15.5)
WBC Count: 8.5 K/uL (ref 4.0–10.5)
nRBC: 0 % (ref 0.0–0.2)

## 2024-05-14 LAB — IRON AND TIBC
Iron: 52 ug/dL (ref 28–170)
Saturation Ratios: 17 % (ref 10.4–31.8)
TIBC: 311 ug/dL (ref 250–450)
UIBC: 259 ug/dL

## 2024-05-14 LAB — FERRITIN: Ferritin: 75 ng/mL (ref 11–307)

## 2024-05-14 LAB — LACTATE DEHYDROGENASE: LDH: 205 U/L (ref 105–235)

## 2024-05-14 MED ORDER — ZANUBRUTINIB 160 MG PO TABS
160.0000 mg | ORAL_TABLET | Freq: Two times a day (BID) | ORAL | 0 refills | Status: DC
  Filled 2024-05-14 (×2): qty 60, 30d supply, fill #0

## 2024-05-14 NOTE — Assessment & Plan Note (Signed)
 Lab Results  Component Value Date   HGB 13.0 05/14/2024   TIBC 311 05/14/2024   IRONPCTSAT 17 05/14/2024   FERRITIN 75 05/14/2024    Continue iron  supplementation Vitron C every other day to further increase ferritin, goal is > 75 Continue to monitor.

## 2024-05-14 NOTE — Assessment & Plan Note (Addendum)
 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   She is on Elqiuis, will watch for possible increase risk of bleeding.  Labs are reviewed and discussed with patient. Continue Zanubrutinib  160mg  BID, clinically she tolerates well.

## 2024-05-14 NOTE — Assessment & Plan Note (Signed)
 Continue Zanubrutinib 160mg  BID

## 2024-05-14 NOTE — Progress Notes (Signed)
 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   She is on Elqiuis, will watch for possible increase risk of bleeding.  Labs are reviewed and discussed with patient. Continue Zanubrutinib  160mg  BID, clinically she tolerates well.     Encounter for antineoplastic chemotherapy Continue Zanubrutinib  160mg  BID  RLS (restless legs syndrome) Lab Results  Component Value Date   HGB 13.0 05/14/2024   TIBC 311 05/14/2024   IRONPCTSAT 17 05/14/2024   FERRITIN 75 05/14/2024    Continue iron  supplementation Vitron C every other day to further increase ferritin, goal is > 75 Continue to monitor.   Follow up 3 months  Call Babara, MD, PhD [facility] Hematology Oncology 05/14/2024       HISTORY OF PRESENTING ILLNESS:  Erika Anderson [facility] 73 y.o. female presents to follow up with CLL. She was found to have abnormal CBC on 03/01/22, total white count of 15, predominantly lymphocytosis. This is chronic since Feb 2023. Peripheral blood smear + smudge cells.  + Restless leg syndrome, sleep disturbance + Family history of ovarian cancer -mother.    Oncology History  CLL (chronic lymphocytic leukemia) (HCC)  03/02/2022 Initial Diagnosis   CLL (chronic lymphocytic leukemia)  -03/15/22 Flowcytometry showed chronic lymphocytic leukemia, B cell, CD38 expression indeterminate for prognosis (23%) and CD20 positive.  45% nuclei positive  for TP53 gene deletion.    03/26/2022 Imaging   US  abdomen 1. No sonographic etiology for night sweats identified. 2. Spleen is at the upper limits of normal in size    04/01/2022 Cancer Staging   Staging form: Chronic Lymphocytic Leukemia / Small Lymphocytic Lymphoma, AJCC 8th Edition - Clinical: Modified Rai Stage 0 (Modified Rai risk: Low, Lymphocytosis: Present, Adenopathy: Absent, Organomegaly: Absent, Anemia: Absent, Thrombocytopenia: Absent) - Signed by Babara Call, MD on 04/01/2022 Stage prefix: Initial diagnosis Absolute lymphocyte count (ALC) (cells/uL): 18   04/19/2022 -  Chemotherapy   Started on Zanubrutinib  160mg  BID      She reports tolerating Zanubrutinib  160mg  BID well. Night sweat has resolved  Appetite is fair.she takes Vitron C every other day  S/p right knee replacement.  She recovers well from the surgery and has participated in physical therapy.   MEDICAL HISTORY:  Past Medical History:  Diagnosis Date   Atrial fibrillation (HCC)    Chronic lymphocytic leukemia (HCC)    Grade II diastolic dysfunction    Hyperlipidemia    Leukemia (HCC)    Mild mitral regurgitation by prior echocardiogram    Obesity (BMI 30-39.9)    Restless legs syndrome (RLS) 12/28/2014   RLS (restless legs syndrome)     SURGICAL HISTORY: Past Surgical History:  Procedure Laterality Date   ATRIAL FIBRILLATION ABLATION N/A 03/22/2022   Procedure: ATRIAL FIBRILLATION ABLATION;  Surgeon: Cindie Ole DASEN, MD;  Location: [facility];  Service: Cardiovascular;  Laterality: N/A;   CATARACT EXTRACTION W/PHACO Left 05/18/2023   Procedure: CATARACT EXTRACTION PHACO AND INTRAOCULAR LENS PLACEMENT (IOC) LEFT;  Surgeon: Mittie Gaskin, MD;  Location: [facility];  Service: Ophthalmology;  Laterality: Left;  6.95 0:44.8   CATARACT EXTRACTION W/PHACO Right 05/25/2023   Procedure: CATARACT EXTRACTION PHACO AND INTRAOCULAR LENS PLACEMENT (IOC) RIGHT 5.35 00:35.4;  Surgeon:  Mittie Gaskin, MD;  Location: [facility];  Service: Ophthalmology;  Laterality: Right;   COLONOSCOPY WITH PROPOFOL  N/A 08/24/2021   Procedure: COLONOSCOPY WITH PROPOFOL ;  Surgeon: Therisa Bi, MD;  Location: [facility] ENDOSCOPY;  Service: Gastroenterology;  Laterality: N/A;   HEMORROIDECTOMY     KNEE SURGERY Right 03/23/2024   TOTAL ABDOMINAL HYSTERECTOMY     ovaries remain    SOCIAL HISTORY: Social History   Socioeconomic History   Marital status: Married    Spouse name: Lemond   Number of children: 2   Years of education: 12   Highest education level: High school graduate  Occupational History   Occupation: retired  Tobacco Use   Smoking status: Never   Smokeless tobacco: Never   Tobacco comments:    Never smoke 04/19/22  Vaping Use   Vaping status: Never Used  Substance and Sexual Activity   Alcohol use: No    Alcohol/week: 0.0 standard drinks of alcohol   Drug use: No   Sexual activity: Not Currently  Other Topics Concern   Not on file  Social History Narrative   Helps take care of grandchildren    Social Drivers of Health   Financial Resource Strain: Low Risk  (03/13/2024)   Overall Financial Resource Strain (CARDIA)    Difficulty of Paying Living Expenses: Not hard at all  Food Insecurity: No Food Insecurity (03/13/2024)   Hunger Vital Sign    Worried About Running Out of Food in the Last Year: Never true    Ran Out of Food in the Last Year: Never true  Transportation Needs: No Transportation Needs (03/13/2024)   PRAPARE - Administrator, Civil Service (Medical): No    Lack of Transportation (Non-Medical): No  Physical Activity: Inactive (03/13/2024)   Exercise Vital Sign    Days of Exercise per Week: 0 days    Minutes of Exercise per Session: 0 min  Stress: No Stress Concern Present (03/13/2024)   [employer] of Occupational Health - Occupational Stress Questionnaire    Feeling of Stress: Not at all  Social Connections: Socially  Integrated (03/13/2024)   Social Connection and Isolation Panel    Frequency of Communication with Friends and Family: More than three times a week    Frequency of Social Gatherings with Friends and Family: More than three times a week    Attends Religious Services: More than 4 times per year    Active Member of [employer] or Organizations: Yes    Attends Engineer, Structural: More than 4 times per year    Marital Status: Married  Catering Manager Violence: Not At Risk (03/13/2024)   Humiliation, Afraid, Rape, and Kick questionnaire    Fear of Current or Ex-Partner: No    Emotionally Abused: No    Physically Abused: No    Sexually Abused: No    FAMILY HISTORY: Family History  Problem Relation Age of Onset   Ovarian cancer Mother 25   Dementia Father    Arthritis Sister    Heart disease Brother        MI   Stroke Paternal Grandmother    Breast cancer Neg Hx     ALLERGIES:  has no known allergies.  MEDICATIONS:  Current Outpatient Medications  Medication Sig Dispense Refill   albuterol  (VENTOLIN  HFA) 108 (90 Base) MCG/ACT  inhaler INHALE 2 PUFFS BY MOUTH EVERY 6 HOURS AS NEEDED FOR WHEEZING FOR SHORTNESS OF BREATH 9 g 0   apixaban  (ELIQUIS ) 5 MG TABS tablet Take 1 tablet (5 mg total) by mouth 2 (two) times daily. 180 tablet 3   betamethasone  dipropionate 0.05 % lotion Apply topically daily.     celecoxib (CELEBREX) 200 MG capsule Take 200 mg by mouth daily.     citalopram  (CELEXA ) 20 MG tablet Take 1 tablet (20 mg total) by mouth daily. 90 tablet 3   gabapentin  (NEURONTIN ) 300 MG capsule Take 2 capsules (600 mg total) by mouth at bedtime. 180 capsule 3   Iron -Vitamin C  65-125 MG TABS Take 1 tablet by mouth every other day. 30 tablet 1   ketoconazole (NIZORAL) 2 % shampoo Apply topically 3 (three) times a week.     metoprolol  tartrate (LOPRESSOR ) 25 MG tablet Take 1 tablet (25 mg total) by mouth 2 (two) times daily. 180 tablet 4   Multiple Vitamins-Minerals (MULTIVITAMIN WITH  MINERALS) tablet Take 1 tablet by mouth daily.     ondansetron  (ZOFRAN -ODT) 4 MG disintegrating tablet Take 4 mg by mouth every 8 (eight) hours as needed.     pantoprazole  (PROTONIX ) 40 MG tablet Take 1 tablet (40 mg total) by mouth daily. 90 tablet 3   rOPINIRole  (REQUIP ) 1 MG tablet Take 1 tablet (1 mg total) by mouth 4 (four) times daily. 360 tablet 4   rosuvastatin  (CRESTOR ) 20 MG tablet Take 1 tablet (20 mg total) by mouth daily. 90 tablet 4   traZODone  (DESYREL ) 50 MG tablet Take 1 tablet (50 mg total) by mouth at bedtime as needed for sleep. 90 tablet 4   amoxicillin  (AMOXIL ) 500 MG capsule Take 1,000 mg by mouth 2 (two) times daily. (Patient not taking: Reported on 05/14/2024)     zanubrutinib  (BRUKINSA ) 160 MG tablet Take 1 tablet (160 mg total) by mouth 2 (two) times daily. 60 tablet 0   No current facility-administered medications for this visit.    Review of Systems  Constitutional:  Negative for appetite change, chills, fatigue and fever.  HENT:   Negative for hearing loss and voice change.   Eyes:  Negative for eye problems.  Respiratory:  Negative for chest tightness and cough.   Cardiovascular:  Negative for chest pain.  Gastrointestinal:  Negative for abdominal distention, abdominal pain and blood in stool.  Endocrine: Negative for hot flashes.       Night sweat  Genitourinary:  Negative for difficulty urinating and frequency.   Musculoskeletal:  Negative for arthralgias.       S/p right knee replacement  Skin:  Negative for itching and rash.  Neurological:  Negative for extremity weakness.  Hematological:  Negative for adenopathy.  Psychiatric/Behavioral:  Negative for confusion and sleep disturbance.      PHYSICAL EXAMINATION: ECOG PERFORMANCE STATUS: 1 - Symptomatic but completely ambulatory  Vitals:   05/14/24 1047  BP: 139/65  Pulse: 61  Resp: 20  Temp: 97.6 F (36.4 C)  SpO2: 100%   Filed Weights   05/14/24 1047  Weight: 247 lb 12.8 oz (112.4 kg)     Physical Exam HENT:     Head: Normocephalic.     Nose: Nose normal.     Mouth/Throat:     Pharynx: No oropharyngeal exudate.  Eyes:     General: No scleral icterus.    Pupils: Pupils are equal, round, and reactive to light.  Cardiovascular:     Rate and Rhythm: Normal  rate.  Pulmonary:     Effort: Pulmonary effort is normal. No respiratory distress.     Breath sounds: No wheezing.  Abdominal:     General: There is no distension.     Palpations: Abdomen is soft.  Musculoskeletal:        General: Normal range of motion.     Cervical back: Normal range of motion.  Skin:    General: Skin is warm and dry.     Findings: No erythema.  Neurological:     Mental Status: She is alert and oriented to person, place, and time. Mental status is at baseline.     Cranial Nerves: No cranial nerve deficit.     Motor: No abnormal muscle tone.  Psychiatric:        Mood and Affect: Affect normal.     LABORATORY DATA:  I have reviewed the data as listed    Latest Ref Rng & Units 05/14/2024   10:25 AM 02/08/2024    9:49 AM 11/08/2023   10:13 AM  CBC  WBC 4.0 - 10.5 K/uL 8.5  6.8  6.7   Hemoglobin 12.0 - 15.0 g/dL 86.9  87.2  87.6   Hematocrit 36.0 - 46.0 % 39.0  38.2  37.7   Platelets 150 - 400 K/uL 247  187  182       Latest Ref Rng & Units 05/14/2024   10:25 AM 02/08/2024    9:49 AM 11/08/2023   10:13 AM  CMP  Glucose 70 - 99 mg/dL 95  93  96   BUN 8 - 23 mg/dL 17  14  15    Creatinine 0.44 - 1.00 mg/dL 9.03  9.12  9.12   Sodium 135 - 145 mmol/L 140  138  142   Potassium 3.5 - 5.1 mmol/L 4.5  4.0  4.6   Chloride 98 - 111 mmol/L 104  106  107   CO2 22 - 32 mmol/L 27  25  27    Calcium  8.9 - 10.3 mg/dL 9.4  9.0  8.6   Total Protein 6.5 - 8.1 g/dL 6.8  6.5  6.6   Total Bilirubin 0.0 - 1.2 mg/dL 0.3  1.0  0.6   Alkaline Phos 38 - 126 U/L 89  60  61   AST 15 - 41 U/L 17  21  24    ALT 0 - 44 U/L 9  17  23     Lab Results  Component Value Date   LDH 205 05/14/2024   LDH 149 02/08/2024    LDH 148 11/08/2023    RADIOGRAPHIC STUDIES: I have personally reviewed the radiological images as listed and agreed with the findings in the report. No results found.

## 2024-05-15 ENCOUNTER — Ambulatory Visit
Admission: RE | Admit: 2024-05-15 | Discharge: 2024-05-15 | Disposition: A | Source: Ambulatory Visit | Attending: Nurse Practitioner | Admitting: Nurse Practitioner

## 2024-05-15 DIAGNOSIS — Z1231 Encounter for screening mammogram for malignant neoplasm of breast: Secondary | ICD-10-CM | POA: Diagnosis not present

## 2024-05-21 ENCOUNTER — Ambulatory Visit: Payer: Self-pay | Admitting: Nurse Practitioner

## 2024-05-21 NOTE — Progress Notes (Signed)
 Contacted via MyChart   Normal mammogram, may repeat in one year:)

## 2024-06-05 ENCOUNTER — Ambulatory Visit: Payer: Self-pay

## 2024-06-05 ENCOUNTER — Other Ambulatory Visit: Payer: Self-pay | Admitting: Oncology

## 2024-06-05 ENCOUNTER — Other Ambulatory Visit: Payer: Self-pay

## 2024-06-05 DIAGNOSIS — C9110 Chronic lymphocytic leukemia of B-cell type not having achieved remission: Secondary | ICD-10-CM

## 2024-06-05 MED ORDER — ZANUBRUTINIB 160 MG PO TABS
160.0000 mg | ORAL_TABLET | Freq: Two times a day (BID) | ORAL | 0 refills | Status: DC
  Filled 2024-06-05 (×2): qty 60, 30d supply, fill #0

## 2024-06-05 NOTE — Telephone Encounter (Signed)
 1st attempt, LVM requesting pt return call.   Copied from CRM [id number]. Topic: Clinical - Medication Question >> Jun 05, 2024  7:46 AM Erika Anderson wrote: Reason for CRM: Patient states for the last three weeks has had chest congestion and a runny nose, has been taking mucinex  dm and it hasn't helped.  Patient can be reached at [phone] >> Jun 05, 2024  7:47 AM Erika Anderson wrote: Patient is wondering if she can be prescribed something to help.

## 2024-06-05 NOTE — Progress Notes (Signed)
 Specialty Pharmacy Refill Coordination Note  RIKI BERNINGER is a 73 y.o. female contacted today regarding refills of specialty medication(s) Zanubrutinib  (BRUKINSA )   Patient requested Delivery   Delivery date: 06/13/24   Verified address: [address] [facility]. [phone]   Medication will be filled on: 06/12/24

## 2024-06-05 NOTE — Telephone Encounter (Signed)
 FYI Only or Action Required?: Action required by provider: request for appointment.  Patient was last seen in primary care on 04/26/2024 by Erika Hadassah SQUIBB, MD.  Called Nurse Triage reporting Cough.  Symptoms began several weeks ago.  Interventions attempted: OTC medications: Mucinex .  Symptoms are: unchanged.Productive cough x 3 weeks. Green mucus,runny nose, fever on and off.  Triage Disposition: See Physician Within 24 Hours  Patient/caregiver understands and will follow disposition?: Yes      Reason for Disposition  [1] Continuous (nonstop) coughing interferes with work or school AND [2] no improvement using cough treatment per Care Advice  Answer Assessment - Initial Assessment Questions 1. ONSET: When did the cough begin?      3 weeks 2. SEVERITY: How bad is the cough today?      moderate 3. SPUTUM: Describe the color of your sputum (e.g., none, dry cough; clear, white, yellow, green)     green 4. HEMOPTYSIS: Are you coughing up any blood? If Yes, ask: How much? (e.g., flecks, streaks, tablespoons, etc.)     no 5. DIFFICULTY BREATHING: Are you having difficulty breathing? If Yes, ask: How bad is it? (e.g., mild, moderate, severe)      no 6. FEVER: Do you have a fever? If Yes, ask: What is your temperature, how was it measured, and when did it start?     On and off 7. CARDIAC HISTORY: Do you have any history of heart disease? (e.g., heart attack, congestive heart failure)      yes 8. LUNG HISTORY: Do you have any history of lung disease?  (e.g., pulmonary embolus, asthma, emphysema)     no 9. PE RISK FACTORS: Do you have a history of blood clots? (or: recent major surgery, recent prolonged travel, bedridden)     no 10. OTHER SYMPTOMS: Do you have any other symptoms? (e.g., runny nose, wheezing, chest pain)       Runny nose 11. PREGNANCY: Is there any chance you are pregnant? When was your last menstrual period?       no 12. TRAVEL:  Have you traveled out of the country in the last month? (e.g., travel history, exposures)       no  Protocols used: Cough - Acute Productive-A-AH

## 2024-06-06 ENCOUNTER — Telehealth: Payer: Self-pay | Admitting: *Deleted

## 2024-06-06 ENCOUNTER — Ambulatory Visit (INDEPENDENT_AMBULATORY_CARE_PROVIDER_SITE_OTHER)

## 2024-06-06 VITALS — BP 144/86 | HR 60 | Ht 65.0 in | Wt 248.0 lb

## 2024-06-06 DIAGNOSIS — J069 Acute upper respiratory infection, unspecified: Secondary | ICD-10-CM

## 2024-06-06 NOTE — Telephone Encounter (Signed)
" ° ° ° ° °  NURSING SYMPTOM TRIAGE ASSESSMENT & DISPOSITION  Mode of interaction:  Telephone Date of symptom triage interaction:  06/06/2024 Time of symptom triage interaction:  1500  Cancer diagnosis:  CLL  Oral chemotherapy:  Yes, prescription for Brukinsa  with last dose taken today.  Symptom Triage Protocol:  Flu-Like Symptoms  History of the problem:  Flu-Like Symptoms Current symptoms: Cough, productive:  Sputum color and quantity:  dark green phlem  Onset: Started 3 weeks ago  Duration: ongoing  Current temperature: Has not taken her temp. Oxygen sats 95% on recheck in pcp office. Pcp's office did not document pt's temp  Relieving factors: Some relief with mucinex  d  Provoking factors: none  Changes in ADLs: none     Additional commentary: Saw pcp today with acute respiratory infection symptoms, who was hesitant to treat patient (per patient) since pt is on Brukinsa .    Triage Nurse Guidance Severity Index  Temperature higher than 102F (38.9C) without suspected neutropenia Severe flu-like symptom cluster, limiting ability to carry out activities of daily living Change in mental status Seek emergency care.  Symptoms not relieved by current homecare instructions within 24 hours Seek urgent care within 24 hours.  If flu-like syndrome is expected from the current therapy Follow homecare instructions. Notify MD if no improvement.     Provider Consulted  Provider name and credentials: Dr. Babara  Provider instruction: [facility]-no contraindication for her to be on antibiotics while on Brukinsa . if she calls back then ok to schedule with [facility].      Patient Instruction  Disclaimer:  Patient specific and Elsevier instruction provided verbally and sent through MyChart for active users.    Fever (non-neutropenic) Use nonsteroidal anti-inflammatory drugs such as acetaminophen  or ibupro-fen unless contraindicated. Aspirin can also be used by adults if platelet count is above  50,000/mm3 Try tepid baths or ice packs as tolerated. Maintain hydration.  Chills For severe chills, a prescription for a narcotic such as meperidine or hydro-morphone may be necessary. Use comfort measures such as warm clothing, blankets, or a hot water bottle (use caution in areas treated with radiation therapy and risk of burns).  Myalgia and headache Use nonsteroidal anti-inflammatory drugs such as acetaminophen  or ibuprofen  unless contraindicated. Opioids and prednisone  may be prescribed. Apply heat or cold on muscles. Apply a cool cloth on forehead. Rest in a quiet, dimly lit room. Apply heat and massage for headaches at the back of the head.  Malaise/fatigue Balance periods of activity with rest periods. Eat a balanced diet.  Cough Use an antitussive such as codeine, dextromethorphan, or benzonatate . Use a decongestant if needed. Use an antihistamine if experiencing a runny nose.  Use a humidifier if experiencing a dry cough.  Seek Emergency Care Immediately if Any of the Following Occurs: Elevated temperature for more than three days Vomiting Seizures Changes in vision Change in mental status  Teach Back Method used:  Yes    Nurse Triage Priority:  Urgent (obtain medical orders as indicated with instruction for 8-24 hour or sooner follow-up)   Barriers to Care:  None identified   Nurse Triage Disposition:  In-person follow-up visit:  [facility]-patient will call back on Friday to let me know if she desires [facility] apt    Protocol Source:  Ruthine HERO., & Eldonna CANDIE Pee.). (2019). Telephone triage for oncology nurses (3rd ed.). Oncology Nursing Society.  "

## 2024-06-06 NOTE — Progress Notes (Addendum)
 "    Acute Patient Visit  Physician: Latamara Melder A Deklan Minar, MD  Patient: Erika Anderson MRN: [id number] DOB: [birth year] PCP: Valerio Melanie DASEN, NP     Subjective:   Chief Complaint  Patient presents with   Cough    Patient is concerned with cough that has dark green phlegm.    This patient is not established with this clinician as his/her primary care physician. The visit was scheduled in a standard 20-minute slot that was not appropriate for the patients clinical complexity or acuity.  The scope, constraints, and potential risks associated with the visit structure were discussed with the patient. Evaluation and management were performed to address prioritized concerns based on information available at the time of the encounter, with recommendations for appropriate follow-up and further evaluation.  The patient is to seek additional or emergency care if any change in their condition.   HPI: The patient is a 73 y.o. female who presents today for:   Discussed the use of AI scribe software for clinical note transcription with the patient, who gave verbal consent to proceed  History of Present Illness   Erika Anderson is a 73 year old female with chronic lymphocytic leukemia, on active chemotherapy who presents with a persistent cough and congestion.  Cough and upper respiratory symptoms - Persistent cough with green congestion since before Christmas - Symptoms are worse at night and disrupt sleep - No current fever or chills - Felt feverish on a couple of days, which improved with rest under a blanket  Dyspnea and fatigue - No shortness of breath at rest - Limited activity level; suspects increased activity may lead to shortness of breath - Easily fatigued with activity  Hematologic malignancy and treatment - Chronic lymphocytic leukemia - Currently undergoing treatment with oral chemotherapy - No complications from chemotherapy reported  Iron  deficiency - Taking  medication for low iron  levels  Blood pressure - Home blood pressure readings tend to be high initially but normalize upon rechecking - Eating and drinking adequately     ROS:   As noted in the HPI    ASSESMENT/PLAN:  Encounter Diagnoses  Name Primary?   URTI (acute upper respiratory infection) Yes    No orders of the defined types were placed in this encounter.   Assessment and Plan    This is an unknown patient to me.  It is impossible for me to adequately evaluate her in the time available.   Lungs are not entirely clear to auscultation and given her overall situation and our lack of chest x-ray today, I have recommended that she has been to be seen in the emergency department or urgent care she needs chest x-ray and blood work done.  This is the most safest way to evaluate her given the holiday.  Pulse oximeter recheck at 97% SaO2.  Discussed with patient that any other mode of action risks complication given her underlying health conditions.       OBJECTIVE: Vitals:   06/06/24 1408  BP: (!) 144/86  Pulse: 60  SpO2: (!) 86%  Weight: 248 lb (112.5 kg)  Height: 5' 5 (1.651 m)    Body mass index is 41.27 kg/m.   Physical Exam Vitals reviewed.  Constitutional:      Appearance: Normal appearance. Well-developed with normal weight.  Cardiovascular:     Rate and Rhythm: Normal rate and regular rhythm. Normal heart sounds. Normal peripheral pulses Pulmonary:     Normal effort, few course breath sounds at  base right> left Skin:    General: Skin is warm and dry without noticeable rash. Neurological:     General: No focal deficit present.  Psychiatric:        Mood and Affect: Mood, behavior and cognition normal       Allergies Patient has no known allergies.  Past Medical History Patient  has a past medical history of Atrial fibrillation (HCC), Chronic lymphocytic leukemia (HCC), Grade II diastolic dysfunction, Hyperlipidemia, Leukemia (HCC), Mild mitral  regurgitation by prior echocardiogram, Obesity (BMI 30-39.9), Restless legs syndrome (RLS) (12/28/2014), and RLS (restless legs syndrome).  Surgical History Patient  has a past surgical history that includes Total abdominal hysterectomy; Hemorroidectomy; Colonoscopy with propofol  (N/A, 08/24/2021); ATRIAL FIBRILLATION ABLATION (N/A, 03/22/2022); Cataract extraction w/PHACO (Left, 05/18/2023); Cataract extraction w/PHACO (Right, 05/25/2023); and Knee surgery (Right, 03/23/2024).  Family History Pateint's family history includes Arthritis in her sister; Dementia in her father; Heart disease in her brother; Ovarian cancer (age of onset: 58) in her mother; Stroke in her paternal grandmother.  Social History Patient  reports that she has never smoked. She has never used smokeless tobacco. She reports that she does not drink alcohol and does not use drugs.    06/06/2024 "

## 2024-06-08 ENCOUNTER — Other Ambulatory Visit: Payer: Self-pay

## 2024-06-08 ENCOUNTER — Inpatient Hospital Stay: Attending: Oncology | Admitting: Nurse Practitioner

## 2024-06-08 ENCOUNTER — Ambulatory Visit
Admission: RE | Admit: 2024-06-08 | Discharge: 2024-06-08 | Disposition: A | Discharge status: Home / Self Care | Source: Ambulatory Visit | Attending: Nurse Practitioner | Admitting: Nurse Practitioner

## 2024-06-08 ENCOUNTER — Encounter: Payer: Self-pay | Admitting: Nurse Practitioner

## 2024-06-08 ENCOUNTER — Ambulatory Visit
Admission: RE | Admit: 2024-06-08 | Discharge: 2024-06-08 | Disposition: A | Discharge status: Home / Self Care | Attending: Nurse Practitioner | Admitting: Nurse Practitioner

## 2024-06-08 ENCOUNTER — Other Ambulatory Visit: Payer: Self-pay | Admitting: Nurse Practitioner

## 2024-06-08 VITALS — BP 148/62 | HR 58 | Temp 97.8°F | Resp 18 | Ht 65.0 in | Wt 249.0 lb

## 2024-06-08 DIAGNOSIS — R0981 Nasal congestion: Secondary | ICD-10-CM | POA: Insufficient documentation

## 2024-06-08 DIAGNOSIS — I4891 Unspecified atrial fibrillation: Secondary | ICD-10-CM | POA: Insufficient documentation

## 2024-06-08 DIAGNOSIS — E669 Obesity, unspecified: Secondary | ICD-10-CM | POA: Insufficient documentation

## 2024-06-08 DIAGNOSIS — C9110 Chronic lymphocytic leukemia of B-cell type not having achieved remission: Secondary | ICD-10-CM | POA: Insufficient documentation

## 2024-06-08 DIAGNOSIS — R059 Cough, unspecified: Secondary | ICD-10-CM

## 2024-06-08 DIAGNOSIS — Z823 Family history of stroke: Secondary | ICD-10-CM | POA: Insufficient documentation

## 2024-06-08 DIAGNOSIS — I700 Atherosclerosis of aorta: Secondary | ICD-10-CM | POA: Insufficient documentation

## 2024-06-08 DIAGNOSIS — J019 Acute sinusitis, unspecified: Secondary | ICD-10-CM | POA: Diagnosis not present

## 2024-06-08 DIAGNOSIS — Z90710 Acquired absence of both cervix and uterus: Secondary | ICD-10-CM | POA: Insufficient documentation

## 2024-06-08 DIAGNOSIS — Z9842 Cataract extraction status, left eye: Secondary | ICD-10-CM | POA: Insufficient documentation

## 2024-06-08 DIAGNOSIS — Z7901 Long term (current) use of anticoagulants: Secondary | ICD-10-CM | POA: Insufficient documentation

## 2024-06-08 DIAGNOSIS — E785 Hyperlipidemia, unspecified: Secondary | ICD-10-CM | POA: Insufficient documentation

## 2024-06-08 DIAGNOSIS — Z1379 Encounter for other screening for genetic and chromosomal anomalies: Secondary | ICD-10-CM | POA: Insufficient documentation

## 2024-06-08 DIAGNOSIS — Z9841 Cataract extraction status, right eye: Secondary | ICD-10-CM | POA: Insufficient documentation

## 2024-06-08 DIAGNOSIS — Z20828 Contact with and (suspected) exposure to other viral communicable diseases: Secondary | ICD-10-CM | POA: Insufficient documentation

## 2024-06-08 DIAGNOSIS — Z8249 Family history of ischemic heart disease and other diseases of the circulatory system: Secondary | ICD-10-CM | POA: Insufficient documentation

## 2024-06-08 DIAGNOSIS — Z8261 Family history of arthritis: Secondary | ICD-10-CM | POA: Insufficient documentation

## 2024-06-08 DIAGNOSIS — R5383 Other fatigue: Secondary | ICD-10-CM | POA: Insufficient documentation

## 2024-06-08 DIAGNOSIS — H7409 Tympanosclerosis, unspecified ear: Secondary | ICD-10-CM | POA: Insufficient documentation

## 2024-06-08 DIAGNOSIS — Z79899 Other long term (current) drug therapy: Secondary | ICD-10-CM | POA: Insufficient documentation

## 2024-06-08 DIAGNOSIS — Z8041 Family history of malignant neoplasm of ovary: Secondary | ICD-10-CM | POA: Insufficient documentation

## 2024-06-08 DIAGNOSIS — G2581 Restless legs syndrome: Secondary | ICD-10-CM | POA: Insufficient documentation

## 2024-06-08 LAB — RESPIRATORY PANEL BY PCR

## 2024-06-08 LAB — RESP PANEL BY RT-PCR (RSV, FLU A&B, COVID)  RVPGX2
Influenza A by PCR: NEGATIVE
Influenza B by PCR: NEGATIVE
Resp Syncytial Virus by PCR: NEGATIVE
SARS Coronavirus 2 by RT PCR: NEGATIVE

## 2024-06-08 MED ORDER — DOXYCYCLINE HYCLATE 100 MG PO TABS: 100.0000 mg | ORAL_TABLET | Freq: Two times a day (BID) | ORAL | 0 refills | Status: AC

## 2024-06-08 MED ORDER — HYDROCOD POLI-CHLORPHE POLI ER 10-8 MG/5ML PO SUER: 5.0000 mL | Freq: Every evening | ORAL | 0 refills | Status: DC | PRN

## 2024-06-08 MED ORDER — BENZONATATE 200 MG PO CAPS: 200.0000 mg | ORAL_CAPSULE | Freq: Three times a day (TID) | ORAL | 0 refills | Status: AC | PRN

## 2024-06-08 NOTE — Progress Notes (Signed)
 "  Symptom Management Clinic  [facility] A Department of the [facility]. [facility] [address] [place], [place] [address] [phone] (phone) [phone] (fax)  Patient Care Team: Valerio Melanie DASEN, NP as PCP - General (Nurse Practitioner) Darliss Rogue, MD as PCP - Cardiology (Cardiology) Babara Call, MD as Consulting Physician (Oncology) Marea, Selinda RAMAN, MD as Referring Physician (Vascular Surgery) Cathlyn Seal, MD as Referring Physician (Dermatology) Mittie Gaskin, MD as Referring Physician (Ophthalmology) Marchia Drivers, MD as Consulting Physician (Orthopedic Surgery)   Name of the patient: Erika Anderson  [id number]  Oct 19, 1950   Date of visit: 06/08/2024  Diagnosis- CLL  Chief complaint/ Reason for visit- Cough  Heme/Onc history:  Oncology History  CLL (chronic lymphocytic leukemia) (HCC)  03/02/2022 Initial Diagnosis   CLL (chronic lymphocytic leukemia)  -03/15/22 Flowcytometry showed chronic lymphocytic leukemia, B cell, CD38 expression indeterminate for prognosis (23%) and CD20 positive.  45% nuclei positive for TP53 gene deletion.    03/26/2022 Imaging   US  abdomen 1. No sonographic etiology for night sweats identified. 2. Spleen is at the upper limits of normal in size    04/01/2022 Cancer Staging   Staging form: Chronic Lymphocytic Leukemia / Small Lymphocytic Lymphoma, AJCC 8th Edition - Clinical: Modified Rai Stage 0 (Modified Rai risk: Low, Lymphocytosis: Present, Adenopathy: Absent, Organomegaly: Absent, Anemia: Absent, Thrombocytopenia: Absent) - Signed by Babara Call, MD on 04/01/2022 Stage prefix: Initial diagnosis Absolute lymphocyte count (ALC) (cells/uL): 18   04/19/2022 -  Chemotherapy   Started on Zanubrutinib  160mg  BID     Interval history- Erika Anderson is a 74 y.o. female with above history of CLL, on zanubrutinib , who presents to Symptom Management Clinic for complaints  cough. Symptoms present for 5 weeks. She has been taking mucinex  DM. Symptoms have persisted. She has little energy. Complains of head congestion, sinus pressure, pain. No dental pain. No ear drainage. Cough wakes her up nightly. No fevers or chills. Husband has been sick this week. Denies nausea or vomiting.   Review of systems- Review of Systems  Constitutional:  Positive for malaise/fatigue. Negative for chills, fever and weight loss.  HENT:  Positive for congestion and sinus pain. Negative for ear discharge, ear pain, hearing loss, nosebleeds, sore throat and tinnitus.   Eyes:  Negative for blurred vision and double vision.  Respiratory:  Positive for cough and sputum production. Negative for hemoptysis, shortness of breath and wheezing.   Cardiovascular:  Negative for chest pain, palpitations and leg swelling.  Gastrointestinal:  Negative for abdominal pain, blood in stool, constipation, diarrhea, melena, nausea and vomiting.  Genitourinary:  Negative for dysuria and urgency.  Musculoskeletal:  Negative for back pain, falls, joint pain and myalgias.  Skin:  Negative for itching and rash.  Neurological:  Negative for dizziness, tingling, sensory change, loss of consciousness, weakness and headaches.  Endo/Heme/Allergies:  Negative for environmental allergies. Does not bruise/bleed easily.  Psychiatric/Behavioral:  Negative for depression. The patient is not nervous/anxious and does not have insomnia.     Allergies[1]  Past Medical History:  Diagnosis Date   Atrial fibrillation (HCC)    Chronic lymphocytic leukemia (HCC)    Grade II diastolic dysfunction    Hyperlipidemia    Leukemia (HCC)    Mild mitral regurgitation by prior echocardiogram    Obesity (BMI 30-39.9)    Restless legs syndrome (RLS) 12/28/2014   RLS (restless legs syndrome)    Past Surgical History:  Procedure Laterality Date  ATRIAL FIBRILLATION ABLATION N/A 03/22/2022   Procedure: ATRIAL FIBRILLATION ABLATION;   Surgeon: Cindie Ole DASEN, MD;  Location: [facility] INVASIVE [facility];  Service: Cardiovascular;  Laterality: N/A;   CATARACT EXTRACTION W/PHACO Left 05/18/2023   Procedure: CATARACT EXTRACTION PHACO AND INTRAOCULAR LENS PLACEMENT (IOC) LEFT;  Surgeon: Mittie Gaskin, MD;  Location: [facility];  Service: Ophthalmology;  Laterality: Left;  6.95 0:44.8   CATARACT EXTRACTION W/PHACO Right 05/25/2023   Procedure: CATARACT EXTRACTION PHACO AND INTRAOCULAR LENS PLACEMENT (IOC) RIGHT 5.35 00:35.4;  Surgeon: Mittie Gaskin, MD;  Location: [facility];  Service: Ophthalmology;  Laterality: Right;   COLONOSCOPY WITH PROPOFOL  N/A 08/24/2021   Procedure: COLONOSCOPY WITH PROPOFOL ;  Surgeon: Therisa Bi, MD;  Location: [facility] ENDOSCOPY;  Service: Gastroenterology;  Laterality: N/A;   HEMORROIDECTOMY     KNEE SURGERY Right 03/23/2024   TOTAL ABDOMINAL HYSTERECTOMY     ovaries remain   Social History   Socioeconomic History   Marital status: Married    Spouse name: Lemond   Number of children: 2   Years of education: 12   Highest education level: High school graduate  Occupational History   Occupation: retired  Tobacco Use   Smoking status: Never   Smokeless tobacco: Never   Tobacco comments:    Never smoke 04/19/22  Vaping Use   Vaping status: Never Used  Substance and Sexual Activity   Alcohol use: No    Alcohol/week: 0.0 standard drinks of alcohol   Drug use: No   Sexual activity: Not Currently  Other Topics Concern   Not on file  Social History Narrative   Helps take care of grandchildren    Social Drivers of Health   Tobacco Use: Low Risk (05/14/2024)   Patient History    Smoking Tobacco Use: Never    Smokeless Tobacco Use: Never    Passive Exposure: Not on file  Financial Resource Strain: Low Risk (03/13/2024)   Overall Financial Resource Strain (CARDIA)    Difficulty of Paying Living Expenses: Not hard at all  Food Insecurity: No Food Insecurity (03/13/2024)    Epic    Worried About Programme Researcher, Broadcasting/film/video in the Last Year: Never true    Ran Out of Food in the Last Year: Never true  Transportation Needs: No Transportation Needs (03/13/2024)   Epic    Lack of Transportation (Medical): No    Lack of Transportation (Non-Medical): No  Physical Activity: Inactive (03/13/2024)   Exercise Vital Sign    Days of Exercise per Week: 0 days    Minutes of Exercise per Session: 0 min  Stress: No Stress Concern Present (03/13/2024)   [employer] of Occupational Health - Occupational Stress Questionnaire    Feeling of Stress: Not at all  Social Connections: Socially Integrated (03/13/2024)   Social Connection and Isolation Panel    Frequency of Communication with Friends and Family: More than three times a week    Frequency of Social Gatherings with Friends and Family: More than three times a week    Attends Religious Services: More than 4 times per year    Active Member of [employer] or Organizations: Yes    Attends Banker Meetings: More than 4 times per year    Marital Status: Married  Catering Manager Violence: Not At Risk (03/13/2024)   Epic    Fear of Current or Ex-Partner: No    Emotionally Abused: No    Physically Abused: No    Sexually Abused: No  Depression (PHQ2-9):  Low Risk (06/06/2024)   Depression (PHQ2-9)    PHQ-2 Score: 0  Alcohol Screen: Low Risk (03/13/2024)   Alcohol Screen    Last Alcohol Screening Score (AUDIT): 0  Housing: Low Risk (03/13/2024)   Epic    Unable to Pay for Housing in the Last Year: No    Number of Times Moved in the Last Year: 0    Homeless in the Last Year: No  Utilities: Not At Risk (03/13/2024)   Epic    Threatened with loss of utilities: No  Health Literacy: Adequate Health Literacy (03/13/2024)   B1300 Health Literacy    Frequency of need for help with medical instructions: Never    Family History  Problem Relation Age of Onset   Ovarian cancer Mother 17   Dementia Father    Arthritis Sister     Heart disease Brother        MI   Stroke Paternal Grandmother    Breast cancer Neg Hx     Current Medications[2]  Physical exam:  Vitals:   06/08/24 1032  BP: (!) 148/62  Pulse: (!) 58  Resp: 18  Temp: 97.8 F (36.6 C)  TempSrc: Tympanic  SpO2: 98%  Weight: 249 lb (112.9 kg)  Height: 5' 5 (1.651 m)   Physical Exam Vitals reviewed.  Constitutional:      Appearance: She is ill-appearing.  HENT:     Right Ear: External ear normal.     Left Ear: External ear normal.     Ears:     Comments: Left ear- tympanosclerosis Right ear- ear canal reddened. No drainage. TM full appearing. Nontender    Nose: Congestion present. No rhinorrhea.     Right Turbinates: Enlarged and swollen.     Right Sinus: No maxillary sinus tenderness or frontal sinus tenderness.     Left Sinus: No maxillary sinus tenderness or frontal sinus tenderness.     Mouth/Throat:     Pharynx: Posterior oropharyngeal erythema present.  Eyes:     General:        Right eye: No discharge.        Left eye: No discharge.     Conjunctiva/sclera: Conjunctivae normal.  Cardiovascular:     Rate and Rhythm: Normal rate and regular rhythm.  Pulmonary:     Effort: Pulmonary effort is normal. No respiratory distress.     Breath sounds: No wheezing or rales.  Lymphadenopathy:     Cervical: No cervical adenopathy.  Skin:    Coloration: Skin is not pale.     Findings: No rash.  Neurological:     Mental Status: She is alert and oriented to person, place, and time.     Assessment and plan- Patient is a 74 y.o. female diagnosed with CLL, on zanubrutinib , who presents to symptom management clinic for:   Cough- side effect of zanubrutinib  (reported in 11-16% of patients) vs infectious (zanubrutinib  may increase risk of URIs) vs others. Saw pcp on 06/06/24 who recommended ER evaluation but patient declined. Afebrile. SpO2 98%. Cough x 5 weeks. Not worsening. Right ear injected with inflamed turbinates and reddened throat.  Clinically suspect post viral cough may now have bacterial component. Given concurrent use of zanubrutinib  recommend antibiotic coverage- recent amoxicillin  use therefore, plan for doxycycline  100 mg BID x 7 days. Plan for RVP and flu/covid/rsv swabs today along with chest xray. Start tessalon  200 mg BID prn for cough. Will also send prescription for tussionex- reviewed narcotic component of cough syrup and precautions. Can  use OTC medications as well. Reviewed flonase  to help open up the paranasal sinuses, to improve outflow and help resolve the infection. We discussed the pathophysiology of how edema from viruses can cause sinus obstruction and secondary bacterial infection, and ultimately ventilating the sinuses is critical to improving and overcoming the infection. If symptoms don't improve or worsen, recommend re-evaluation.  Chest xray reviewed and no evidence of pneumonia or pleural effusion. Negative flu/covid/rsv. RVP pending.  Incidental tympanosclerosis of left ear. Asymptomatic. Recommend f/u with pcp for ongoing monitoring & management. Could consider evaluation with audiometry CLL- ok to continue zanubrutinib  and follow up with Dr Babara.  Aortic atherosclerosis- incidental on chest xray. Recommend follow up with PCP for management.    Visit Diagnosis 1. Cough, unspecified type   2. Acute non-recurrent sinusitis, unspecified location    Patient expressed understanding and was in agreement with this plan. She also understands that She can call clinic at any time with any questions, concerns, or complaints.   Thank you for allowing me to participate in the care of this very pleasant patient.   Tinnie Dawn, DNP, AGNP-C, AOCNP [facility] [phone]  CC: Dr Babara, Jolene Cannady, NP     [1] No Known Allergies [2]  Current Outpatient Medications:    albuterol  (VENTOLIN  HFA) 108 (90 Base) MCG/ACT inhaler, INHALE 2 PUFFS BY MOUTH EVERY 6 HOURS AS NEEDED FOR WHEEZING  FOR SHORTNESS OF BREATH, Disp: 9 g, Rfl: 0   apixaban  (ELIQUIS ) 5 MG TABS tablet, Take 1 tablet (5 mg total) by mouth 2 (two) times daily., Disp: 180 tablet, Rfl: 3   betamethasone  dipropionate 0.05 % lotion, Apply topically daily., Disp: , Rfl:    celecoxib (CELEBREX) 200 MG capsule, Take 200 mg by mouth daily., Disp: , Rfl:    citalopram  (CELEXA ) 20 MG tablet, Take 1 tablet (20 mg total) by mouth daily., Disp: 90 tablet, Rfl: 3   gabapentin  (NEURONTIN ) 300 MG capsule, Take 2 capsules (600 mg total) by mouth at bedtime., Disp: 180 capsule, Rfl: 3   gabapentin  (NEURONTIN ) 300 MG capsule, Take by mouth., Disp: , Rfl:    Iron -Vitamin C  65-125 MG TABS, Take 1 tablet by mouth every other day., Disp: 30 tablet, Rfl: 1   ketoconazole (NIZORAL) 2 % shampoo, Apply topically 3 (three) times a week., Disp: , Rfl:    metoprolol  tartrate (LOPRESSOR ) 25 MG tablet, Take 1 tablet (25 mg total) by mouth 2 (two) times daily., Disp: 180 tablet, Rfl: 4   Multiple Vitamins-Minerals (MULTIVITAMIN WITH MINERALS) tablet, Take 1 tablet by mouth daily., Disp: , Rfl:    ondansetron  (ZOFRAN -ODT) 4 MG disintegrating tablet, Take 4 mg by mouth every 8 (eight) hours as needed., Disp: , Rfl:    pantoprazole  (PROTONIX ) 40 MG tablet, Take 1 tablet by mouth once daily, Disp: 90 tablet, Rfl: 2   rOPINIRole  (REQUIP ) 1 MG tablet, Take 1 tablet (1 mg total) by mouth 4 (four) times daily., Disp: 360 tablet, Rfl: 4   rosuvastatin  (CRESTOR ) 20 MG tablet, Take 1 tablet (20 mg total) by mouth daily., Disp: 90 tablet, Rfl: 4   traZODone  (DESYREL ) 50 MG tablet, Take 1 tablet (50 mg total) by mouth at bedtime as needed for sleep., Disp: 90 tablet, Rfl: 4   zanubrutinib  (BRUKINSA ) 160 MG tablet, Take 1 tablet (160 mg total) by mouth 2 (two) times daily., Disp: 60 tablet, Rfl: 0   amoxicillin  (AMOXIL ) 500 MG capsule, Take 1,000 mg by mouth 2 (two) times daily. (Patient  not taking: Reported on 06/06/2024), Disp: , Rfl:   "

## 2024-06-08 NOTE — Telephone Encounter (Signed)
" ° °  °  NURSING SYMPTOM TRIAGE ASSESSMENT & DISPOSITION   Mode of interaction:  Telephone Date of symptom triage interaction:  06/08/2024 Time of symptom triage interaction:  850   Cancer diagnosis:  CLL   Oral chemotherapy:  Yes, prescription for Brukinsa  with last dose taken today.   Patient called Wed afternoon 12/31. With upper respiratory congestion/cough. Dr. Babara wanted pt to see [facility] if she wasn't feeling any better. She was ok with antibiotics rx. Patient currently on Brukinsa . Patient stated her symptoms are the same. Denies any fever.    History of the problem:  Flu-Like Symptoms Current symptoms: Cough, productive:  Sputum color and quantity:  thick dark green sputum/nasal congestion  Onset: Started 3 weeks ago. No improvement.  Duration: ongoing  Current temperature: Denies any fever  Relieving factors: minimal relief with mucinex   Provoking factors: none  Changes in ADLs: none     Additional commentary: Dr. Babara requested [facility]     "

## 2024-06-11 ENCOUNTER — Other Ambulatory Visit: Payer: Self-pay | Admitting: Nurse Practitioner

## 2024-06-11 MED ORDER — HYDROCODONE BIT-HOMATROP MBR 5-1.5 MG/5ML PO SOLN: 5.0000 mL | Freq: Two times a day (BID) | ORAL | 0 refills | Status: AC | PRN

## 2024-06-13 ENCOUNTER — Other Ambulatory Visit (HOSPITAL_COMMUNITY): Payer: Self-pay

## 2024-06-13 ENCOUNTER — Other Ambulatory Visit: Payer: Self-pay

## 2024-07-06 ENCOUNTER — Other Ambulatory Visit (HOSPITAL_COMMUNITY): Payer: Self-pay

## 2024-07-06 ENCOUNTER — Other Ambulatory Visit: Payer: Self-pay | Admitting: Oncology

## 2024-07-06 ENCOUNTER — Other Ambulatory Visit: Payer: Self-pay

## 2024-07-06 DIAGNOSIS — C9110 Chronic lymphocytic leukemia of B-cell type not having achieved remission: Secondary | ICD-10-CM

## 2024-07-06 MED ORDER — ZANUBRUTINIB 160 MG PO TABS
160.0000 mg | ORAL_TABLET | Freq: Two times a day (BID) | ORAL | 0 refills | Status: AC
  Filled 2024-07-06 – 2024-07-09 (×2): qty 60, 30d supply, fill #0

## 2024-07-09 ENCOUNTER — Other Ambulatory Visit: Payer: Self-pay

## 2024-07-09 NOTE — Progress Notes (Signed)
 Specialty Pharmacy Refill Coordination Note  Erika Anderson is a 74 y.o. female contacted today regarding refills of specialty medication(s) Zanubrutinib  (BRUKINSA )   Patient requested Delivery   Delivery date: 07/17/24   Verified address: [address] [facility]. [phone]   Medication will be filled on: 07/16/24

## 2024-07-11 ENCOUNTER — Other Ambulatory Visit: Payer: Self-pay

## 2024-08-07 ENCOUNTER — Ambulatory Visit (INDEPENDENT_AMBULATORY_CARE_PROVIDER_SITE_OTHER): Admitting: Vascular Surgery

## 2024-08-14 ENCOUNTER — Inpatient Hospital Stay: Admitting: Oncology

## 2024-08-14 ENCOUNTER — Inpatient Hospital Stay

## 2024-09-11 ENCOUNTER — Encounter: Admitting: Nurse Practitioner

## 2025-03-19 ENCOUNTER — Ambulatory Visit
